# Patient Record
Sex: Female | Born: 1945 | Race: White | Hispanic: No | Marital: Married | Smoking: Never smoker
Health system: Southern US, Community
[De-identification: ages and names within clinical notes are randomized; demographics above are authoritative.]

## PROBLEM LIST (undated history)

## (undated) DIAGNOSIS — T7840XA Allergy, unspecified, initial encounter: Secondary | ICD-10-CM

## (undated) DIAGNOSIS — K219 Gastro-esophageal reflux disease without esophagitis: Secondary | ICD-10-CM

## (undated) DIAGNOSIS — I10 Essential (primary) hypertension: Secondary | ICD-10-CM

## (undated) DIAGNOSIS — Z9109 Other allergy status, other than to drugs and biological substances: Secondary | ICD-10-CM

## (undated) DIAGNOSIS — R011 Cardiac murmur, unspecified: Secondary | ICD-10-CM

## (undated) DIAGNOSIS — K589 Irritable bowel syndrome without diarrhea: Secondary | ICD-10-CM

## (undated) DIAGNOSIS — R7303 Prediabetes: Secondary | ICD-10-CM

## (undated) DIAGNOSIS — M519 Unspecified thoracic, thoracolumbar and lumbosacral intervertebral disc disorder: Secondary | ICD-10-CM

## (undated) DIAGNOSIS — C50419 Malignant neoplasm of upper-outer quadrant of unspecified female breast: Secondary | ICD-10-CM

## (undated) DIAGNOSIS — H269 Unspecified cataract: Secondary | ICD-10-CM

## (undated) DIAGNOSIS — E7800 Pure hypercholesterolemia, unspecified: Secondary | ICD-10-CM

## (undated) DIAGNOSIS — C801 Malignant (primary) neoplasm, unspecified: Secondary | ICD-10-CM

## (undated) DIAGNOSIS — R739 Hyperglycemia, unspecified: Secondary | ICD-10-CM

## (undated) DIAGNOSIS — R7989 Other specified abnormal findings of blood chemistry: Secondary | ICD-10-CM

## (undated) DIAGNOSIS — K635 Polyp of colon: Secondary | ICD-10-CM

## (undated) DIAGNOSIS — R928 Other abnormal and inconclusive findings on diagnostic imaging of breast: Secondary | ICD-10-CM

## (undated) DIAGNOSIS — R0989 Other specified symptoms and signs involving the circulatory and respiratory systems: Secondary | ICD-10-CM

## (undated) DIAGNOSIS — R945 Abnormal results of liver function studies: Secondary | ICD-10-CM

## (undated) DIAGNOSIS — D175 Benign lipomatous neoplasm of intra-abdominal organs: Secondary | ICD-10-CM

## (undated) DIAGNOSIS — D649 Anemia, unspecified: Secondary | ICD-10-CM

## (undated) DIAGNOSIS — M1990 Unspecified osteoarthritis, unspecified site: Secondary | ICD-10-CM

## (undated) DIAGNOSIS — K5790 Diverticulosis of intestine, part unspecified, without perforation or abscess without bleeding: Secondary | ICD-10-CM

## (undated) DIAGNOSIS — Z8489 Family history of other specified conditions: Secondary | ICD-10-CM

## (undated) DIAGNOSIS — D126 Benign neoplasm of colon, unspecified: Secondary | ICD-10-CM

## (undated) HISTORY — DX: Benign neoplasm of colon, unspecified: D12.6

## (undated) HISTORY — DX: Unspecified thoracic, thoracolumbar and lumbosacral intervertebral disc disorder: M51.9

## (undated) HISTORY — DX: Other abnormal and inconclusive findings on diagnostic imaging of breast: R92.8

## (undated) HISTORY — DX: Essential (primary) hypertension: I10

## (undated) HISTORY — DX: Anemia, unspecified: D64.9

## (undated) HISTORY — DX: Abnormal results of liver function studies: R94.5

## (undated) HISTORY — DX: Other specified abnormal findings of blood chemistry: R79.89

## (undated) HISTORY — PX: GALLBLADDER SURGERY: SHX652

## (undated) HISTORY — DX: Polyp of colon: K63.5

## (undated) HISTORY — PX: OTHER SURGICAL HISTORY: SHX169

## (undated) HISTORY — DX: Irritable bowel syndrome, unspecified: K58.9

## (undated) HISTORY — PX: CATARACT EXTRACTION, BILATERAL: SHX1313

## (undated) HISTORY — DX: Benign lipomatous neoplasm of intra-abdominal organs: D17.5

## (undated) HISTORY — DX: Diverticulosis of intestine, part unspecified, without perforation or abscess without bleeding: K57.90

## (undated) HISTORY — DX: Pure hypercholesterolemia, unspecified: E78.00

## (undated) HISTORY — DX: Allergy, unspecified, initial encounter: T78.40XA

## (undated) HISTORY — DX: Unspecified cataract: H26.9

## (undated) HISTORY — PX: HERNIA REPAIR: SHX51

## (undated) HISTORY — PX: LAPAROSCOPIC HYSTERECTOMY: SHX1926

## (undated) HISTORY — DX: Cardiac murmur, unspecified: R01.1

## (undated) HISTORY — DX: Malignant neoplasm of upper-outer quadrant of unspecified female breast: C50.419

## (undated) HISTORY — DX: Other specified symptoms and signs involving the circulatory and respiratory systems: R09.89

## (undated) HISTORY — DX: Gastro-esophageal reflux disease without esophagitis: K21.9

## (undated) HISTORY — DX: Malignant (primary) neoplasm, unspecified: C80.1

## (undated) HISTORY — DX: Other allergy status, other than to drugs and biological substances: Z91.09

## (undated) HISTORY — DX: Hyperglycemia, unspecified: R73.9

---

## 1968-08-02 HISTORY — PX: BREAST SURGERY: SHX581

## 1974-08-02 HISTORY — PX: TUBAL LIGATION: SHX77

## 1987-08-03 HISTORY — PX: ABDOMINAL HYSTERECTOMY: SHX81

## 1995-08-03 HISTORY — PX: NASAL SINUS SURGERY: SHX719

## 1996-08-02 HISTORY — PX: OTHER SURGICAL HISTORY: SHX169

## 1997-08-02 HISTORY — PX: ANKLE SURGERY: SHX546

## 1998-10-17 ENCOUNTER — Ambulatory Visit (HOSPITAL_COMMUNITY): Admission: RE | Admit: 1998-10-17 | Discharge: 1998-10-17 | Payer: Self-pay | Admitting: Neurosurgery

## 1999-01-27 ENCOUNTER — Ambulatory Visit (HOSPITAL_COMMUNITY): Admission: RE | Admit: 1999-01-27 | Discharge: 1999-01-27 | Payer: Self-pay | Admitting: Gastroenterology

## 1999-03-10 ENCOUNTER — Encounter: Admission: RE | Admit: 1999-03-10 | Discharge: 1999-03-26 | Payer: Self-pay | Admitting: Neurosurgery

## 1999-03-20 ENCOUNTER — Other Ambulatory Visit: Admission: RE | Admit: 1999-03-20 | Discharge: 1999-03-20 | Payer: Self-pay | Admitting: *Deleted

## 1999-08-03 HISTORY — PX: CHOLECYSTECTOMY: SHX55

## 1999-08-04 ENCOUNTER — Encounter: Payer: Self-pay | Admitting: *Deleted

## 1999-08-04 ENCOUNTER — Ambulatory Visit (HOSPITAL_COMMUNITY): Admission: RE | Admit: 1999-08-04 | Discharge: 1999-08-04 | Payer: Self-pay | Admitting: *Deleted

## 1999-08-14 ENCOUNTER — Encounter: Payer: Self-pay | Admitting: General Surgery

## 1999-08-14 ENCOUNTER — Inpatient Hospital Stay (HOSPITAL_COMMUNITY): Admission: RE | Admit: 1999-08-14 | Discharge: 1999-08-14 | Payer: Self-pay | Admitting: General Surgery

## 1999-12-09 ENCOUNTER — Other Ambulatory Visit: Admission: RE | Admit: 1999-12-09 | Discharge: 1999-12-09 | Payer: Self-pay | Admitting: *Deleted

## 2001-02-09 ENCOUNTER — Ambulatory Visit (HOSPITAL_COMMUNITY): Admission: RE | Admit: 2001-02-09 | Discharge: 2001-02-09 | Payer: Self-pay | Admitting: Gastroenterology

## 2001-02-23 ENCOUNTER — Other Ambulatory Visit: Admission: RE | Admit: 2001-02-23 | Discharge: 2001-02-23 | Payer: Self-pay | Admitting: *Deleted

## 2004-06-16 ENCOUNTER — Ambulatory Visit: Payer: Self-pay | Admitting: Internal Medicine

## 2004-07-01 ENCOUNTER — Ambulatory Visit: Payer: Self-pay | Admitting: Internal Medicine

## 2004-07-28 ENCOUNTER — Ambulatory Visit: Payer: Self-pay | Admitting: Internal Medicine

## 2004-07-29 ENCOUNTER — Ambulatory Visit: Payer: Self-pay | Admitting: Unknown Physician Specialty

## 2004-11-30 ENCOUNTER — Encounter: Admission: RE | Admit: 2004-11-30 | Discharge: 2004-11-30 | Payer: Self-pay | Admitting: Orthopedic Surgery

## 2005-04-01 ENCOUNTER — Emergency Department: Payer: Self-pay | Admitting: Emergency Medicine

## 2005-06-09 ENCOUNTER — Ambulatory Visit: Payer: Self-pay | Admitting: Internal Medicine

## 2005-07-02 ENCOUNTER — Ambulatory Visit: Payer: Self-pay | Admitting: Internal Medicine

## 2005-07-20 ENCOUNTER — Ambulatory Visit: Payer: Self-pay | Admitting: Internal Medicine

## 2005-08-10 ENCOUNTER — Ambulatory Visit: Payer: Self-pay | Admitting: Internal Medicine

## 2007-03-02 ENCOUNTER — Ambulatory Visit: Payer: Self-pay | Admitting: Internal Medicine

## 2007-06-23 ENCOUNTER — Ambulatory Visit: Payer: Self-pay | Admitting: Internal Medicine

## 2008-03-07 ENCOUNTER — Ambulatory Visit: Payer: Self-pay | Admitting: Internal Medicine

## 2008-03-25 ENCOUNTER — Ambulatory Visit: Payer: Self-pay | Admitting: Internal Medicine

## 2008-10-14 ENCOUNTER — Ambulatory Visit: Payer: Self-pay | Admitting: Unknown Physician Specialty

## 2009-03-26 ENCOUNTER — Ambulatory Visit: Payer: Self-pay | Admitting: Internal Medicine

## 2010-06-04 ENCOUNTER — Ambulatory Visit: Payer: Self-pay | Admitting: Internal Medicine

## 2010-06-10 ENCOUNTER — Ambulatory Visit: Payer: Self-pay | Admitting: Internal Medicine

## 2010-08-02 DIAGNOSIS — K5790 Diverticulosis of intestine, part unspecified, without perforation or abscess without bleeding: Secondary | ICD-10-CM

## 2010-08-02 HISTORY — DX: Diverticulosis of intestine, part unspecified, without perforation or abscess without bleeding: K57.90

## 2011-04-21 ENCOUNTER — Ambulatory Visit: Payer: Self-pay | Admitting: Internal Medicine

## 2011-06-02 ENCOUNTER — Ambulatory Visit: Payer: Self-pay | Admitting: Gastroenterology

## 2011-06-02 HISTORY — PX: COLONOSCOPY: SHX174

## 2011-06-14 ENCOUNTER — Ambulatory Visit: Payer: Self-pay | Admitting: Internal Medicine

## 2011-06-16 ENCOUNTER — Ambulatory Visit: Payer: Self-pay | Admitting: Internal Medicine

## 2011-12-15 ENCOUNTER — Ambulatory Visit: Payer: Self-pay | Admitting: Internal Medicine

## 2012-05-16 ENCOUNTER — Ambulatory Visit (INDEPENDENT_AMBULATORY_CARE_PROVIDER_SITE_OTHER): Payer: Medicare Other

## 2012-05-16 DIAGNOSIS — Z23 Encounter for immunization: Secondary | ICD-10-CM

## 2012-06-19 ENCOUNTER — Ambulatory Visit: Payer: Self-pay | Admitting: General Surgery

## 2012-07-13 ENCOUNTER — Telehealth: Payer: Self-pay | Admitting: Internal Medicine

## 2012-07-13 NOTE — Telephone Encounter (Signed)
Refill request for potassium chloride ter #60 Sig: take one tablet by mouth twice daily

## 2012-07-14 ENCOUNTER — Other Ambulatory Visit: Payer: Self-pay | Admitting: *Deleted

## 2012-07-14 MED ORDER — POTASSIUM CHLORIDE ER 10 MEQ PO TBCR: 10.0000 meq | EXTENDED_RELEASE_TABLET | Freq: Two times a day (BID) | ORAL | Status: DC

## 2012-07-14 NOTE — Telephone Encounter (Signed)
Script filled per telephone

## 2012-07-14 NOTE — Progress Notes (Signed)
Patient script refilled

## 2012-07-21 ENCOUNTER — Ambulatory Visit (INDEPENDENT_AMBULATORY_CARE_PROVIDER_SITE_OTHER): Payer: Medicare Other | Admitting: Internal Medicine

## 2012-07-21 ENCOUNTER — Encounter: Payer: Self-pay | Admitting: Internal Medicine

## 2012-07-21 VITALS — BP 120/70 | HR 81 | Temp 98.0°F | Ht 60.0 in | Wt 182.8 lb

## 2012-07-21 DIAGNOSIS — Z9109 Other allergy status, other than to drugs and biological substances: Secondary | ICD-10-CM

## 2012-07-21 DIAGNOSIS — I10 Essential (primary) hypertension: Secondary | ICD-10-CM

## 2012-07-21 DIAGNOSIS — E7800 Pure hypercholesterolemia, unspecified: Secondary | ICD-10-CM

## 2012-07-21 DIAGNOSIS — M519 Unspecified thoracic, thoracolumbar and lumbosacral intervertebral disc disorder: Secondary | ICD-10-CM

## 2012-07-21 DIAGNOSIS — R739 Hyperglycemia, unspecified: Secondary | ICD-10-CM

## 2012-07-21 DIAGNOSIS — R0989 Other specified symptoms and signs involving the circulatory and respiratory systems: Secondary | ICD-10-CM

## 2012-07-21 DIAGNOSIS — R945 Abnormal results of liver function studies: Secondary | ICD-10-CM | POA: Insufficient documentation

## 2012-07-21 DIAGNOSIS — R7989 Other specified abnormal findings of blood chemistry: Secondary | ICD-10-CM

## 2012-07-21 DIAGNOSIS — K219 Gastro-esophageal reflux disease without esophagitis: Secondary | ICD-10-CM

## 2012-07-21 DIAGNOSIS — K5790 Diverticulosis of intestine, part unspecified, without perforation or abscess without bleeding: Secondary | ICD-10-CM | POA: Insufficient documentation

## 2012-07-21 DIAGNOSIS — R5381 Other malaise: Secondary | ICD-10-CM

## 2012-07-21 DIAGNOSIS — R5383 Other fatigue: Secondary | ICD-10-CM

## 2012-07-21 DIAGNOSIS — R7309 Other abnormal glucose: Secondary | ICD-10-CM

## 2012-07-21 DIAGNOSIS — K5730 Diverticulosis of large intestine without perforation or abscess without bleeding: Secondary | ICD-10-CM

## 2012-07-21 MED ORDER — PANTOPRAZOLE SODIUM 40 MG PO TBEC: 40.0000 mg | DELAYED_RELEASE_TABLET | Freq: Two times a day (BID) | ORAL | Status: DC

## 2012-07-21 NOTE — Patient Instructions (Signed)
It was nice seeing you today.  I want you to try Protonix 40mg  - take one - two times per day.  Let me know if problems.

## 2012-07-22 ENCOUNTER — Encounter: Payer: Self-pay | Admitting: Internal Medicine

## 2012-07-22 DIAGNOSIS — K219 Gastro-esophageal reflux disease without esophagitis: Secondary | ICD-10-CM | POA: Insufficient documentation

## 2012-07-22 NOTE — Assessment & Plan Note (Signed)
Unable to tolerate statins.  On niacin.  Low cholesterol diet and exercise.  Follow.   

## 2012-07-22 NOTE — Assessment & Plan Note (Signed)
Evaluated by orthopedics.  She declined epidural.  Has had physical therapy.  Follow.

## 2012-07-22 NOTE — Progress Notes (Signed)
Subjective:    Patient ID: Helen Marshall, female    DOB: June 26, 1946, 66 y.o.   MRN: [id number]  HPI 66 year old female with past history of hypertension, hypercholesterolemia and GERD who comes in today to follow up on these issues as well as for a complete physical exam.  She states she is doing relatively well.  Has had increased problems with acid reflux.  Saw Dr Jenne Campus.  On allergy shots now.  Also, now taking omeprazole bid.  Still having acid reflux.  Mostly at night.  Feels bloated.  Some constipation.  No nausea or vomiting.  No blood in the stool.  No urinary change.    Past Medical History  Diagnosis Date  . Hypertension   . Hypercholesterolemia   . IBS (irritable bowel syndrome)   . GERD (gastroesophageal reflux disease)   . Carotid bruit     left  . Diverticulosis   . Hyperglycemia   . Lumbar disc disease   . Environmental allergies     Current Outpatient Prescriptions on File Prior to Visit  Medication Sig Dispense Refill  . amLODipine (NORVASC) 5 MG tablet Take 5 mg by mouth daily.       . benazepril (LOTENSIN) 40 MG tablet Take 40 mg by mouth daily.       . calcium-vitamin D (CALCIUM 500+D) 500-400 MG-UNIT per tablet Take 1 tablet by mouth 2 (two) times daily.      . fluticasone (FLONASE) 50 MCG/ACT nasal spray Place 2 sprays into the nose daily.      . hydrochlorothiazide (HYDRODIURIL) 25 MG tablet Take 25 mg by mouth daily.       . niacin (NIASPAN) 1000 MG CR tablet Take 1,000 mg by mouth 2 (two) times daily.      . potassium chloride (K-DUR) 10 MEQ tablet Take 1 tablet (10 mEq total) by mouth 2 (two) times daily.  60 tablet  0  . sertraline (ZOLOFT) 25 MG tablet Take 25 mg by mouth daily.      . pantoprazole (PROTONIX) 40 MG tablet Take 1 tablet (40 mg total) by mouth 2 (two) times daily.  60 tablet  3    Review of Systems Patient denies any headache, lightheadedness or dizziness. No sinus or allergy symptoms.  No chest pain, tightness or palpitations.  No  increased shortness of breath, cough or congestion.  No nausea or vomiting.  No abdominal pain or cramping.  Does have the increased acid reflux as outlined.  On prilosec bid now.  No diarrhea, BRBPR or melana.  Does report some problems with constipation.  No urine change.  Handling stress relatively well.       Objective:   Physical Exam Filed Vitals:   07/21/12 1035  BP: 120/70  Pulse: 81  Temp: 98 F (36.7 C)   Blood pressure recheck:  15/75  66 year old female in no acute distress.   HEENT:  Nares- clear.  Oropharynx - without lesions. NECK:  Supple.  Nontender.  No audible bruit.  HEART:  Appears to be regular. LUNGS:  No crackles or wheezing audible.  Respirations even and unlabored.  RADIAL PULSE:  Equal bilaterally.    BREASTS:  No nipple discharge or nipple retraction present.  Could not appreciate any distinct nodules or axillary adenopathy.  ABDOMEN:  Soft, nontender.  Bowel sounds present and normal.  No audible abdominal bruit.  GU:  Normal external genitalia.  Vaginal vault without lesions.  S/P hysterectomy.  Could not appreciate  any adnexal masses or tenderness.   RECTAL:  Heme negative.   EXTREMITIES:  No increased edema present.  DP pulses palpable and equal bilaterally.           Assessment & Plan:  HISTORY OF LEFT CAROTID BRUIT.  Carotid ultrasound 9/12 revealed no significant stenosis.  Asymptomatic.  Continue daily aspirin.   CARDIOVASCULAR.  Stable.  Continue risk factor modification.  Has a known murmur.    INCREASED PSYCHOSOCIAL STRESSORS.  On Zoloft.  Stable.  Follow.  ABNORMAL MAMMOGRAM.  Just saw Dr Lemar Livings.  Had a follow up mammogram 06/19/12 - Birads II.  Follow.     HEALTH MAINTENANCE.  Physical today.  Is s/p hysterectomy.  Does not require yearly pap smears.  Colonoscopy 06/02/11 revealed diverticulosis and a lipoma.  Had follow up mammogram (Dr Lemar Livings) 06/19/12 - BiRADS II.  Bone density 05/26/10 normal.

## 2012-07-22 NOTE — Assessment & Plan Note (Signed)
Last liver panel wnl.  Recheck liver panel.

## 2012-07-22 NOTE — Assessment & Plan Note (Signed)
Seeing Dr McQueen.  Receiving allergy shots now.  Follow.   

## 2012-07-22 NOTE — Assessment & Plan Note (Signed)
Blood pressure is under good control.  Same meds.  Check metabolic panel.     

## 2012-07-22 NOTE — Assessment & Plan Note (Signed)
Low carb diet and exercise.  Check met b and a1c.   

## 2012-07-22 NOTE — Assessment & Plan Note (Signed)
Carotid ultrasound 9/12 revealed no significant stenosis.  Asymptomatic. Continue daily aspirin.   

## 2012-07-22 NOTE — Assessment & Plan Note (Addendum)
Some constipation.  Follow.  Increased fiber, fluid intake, etc.  Colonoscopy 06/02/11 revealed diverticulosis and a lipoma.

## 2012-07-22 NOTE — Assessment & Plan Note (Signed)
Increased symptoms.  Improved some on prilosec twice a day.  Still with break through symptoms.  Not eating late.  Trying to watch what she eats.  Will change to Protonix 40mg  bid.  Follow.  Get her back in soon to reassess.

## 2012-07-24 ENCOUNTER — Other Ambulatory Visit (INDEPENDENT_AMBULATORY_CARE_PROVIDER_SITE_OTHER): Payer: Medicare Other

## 2012-07-24 DIAGNOSIS — R5383 Other fatigue: Secondary | ICD-10-CM

## 2012-07-24 DIAGNOSIS — R7309 Other abnormal glucose: Secondary | ICD-10-CM

## 2012-07-24 DIAGNOSIS — R945 Abnormal results of liver function studies: Secondary | ICD-10-CM

## 2012-07-24 DIAGNOSIS — E7800 Pure hypercholesterolemia, unspecified: Secondary | ICD-10-CM

## 2012-07-24 DIAGNOSIS — I10 Essential (primary) hypertension: Secondary | ICD-10-CM

## 2012-07-24 DIAGNOSIS — R7989 Other specified abnormal findings of blood chemistry: Secondary | ICD-10-CM

## 2012-07-24 DIAGNOSIS — R739 Hyperglycemia, unspecified: Secondary | ICD-10-CM

## 2012-07-24 LAB — BASIC METABOLIC PANEL
BUN: 13 mg/dL (ref 6–23)
Calcium: 9.2 mg/dL (ref 8.4–10.5)
GFR: 77.16 mL/min (ref >60.00)
Glucose, Bld: 112 mg/dL — ABNORMAL HIGH (ref 70–99)
Sodium: 136 mEq/L (ref 135–145)

## 2012-07-24 LAB — LIPID PANEL
HDL: 68 mg/dL (ref >39.00)
Triglycerides: 73 mg/dL (ref 0.0–149.0)

## 2012-07-24 LAB — HEPATIC FUNCTION PANEL
ALT: 24 U/L (ref 0–35)
AST: 28 U/L (ref 0–37)
Albumin: 3.8 g/dL (ref 3.5–5.2)

## 2012-07-24 LAB — LDL CHOLESTEROL, DIRECT: Direct LDL: 117.8 mg/dL

## 2012-07-24 LAB — CBC WITH DIFFERENTIAL/PLATELET
Basophils Absolute: 0 10*3/uL (ref 0.0–0.1)
Lymphocytes Relative: 33 % (ref 12.0–46.0)
Monocytes Relative: 10.3 % (ref 3.0–12.0)
Platelets: 217 10*3/uL (ref 150.0–400.0)
RDW: 13.6 % (ref 11.5–14.6)

## 2012-07-24 LAB — TSH: TSH: 0.92 u[IU]/mL (ref 0.35–5.50)

## 2012-08-02 DIAGNOSIS — C801 Malignant (primary) neoplasm, unspecified: Secondary | ICD-10-CM

## 2012-08-02 HISTORY — DX: Malignant (primary) neoplasm, unspecified: C80.1

## 2012-08-02 HISTORY — PX: BREAST LUMPECTOMY: SHX2

## 2012-08-09 ENCOUNTER — Other Ambulatory Visit: Payer: Self-pay | Admitting: *Deleted

## 2012-08-09 ENCOUNTER — Telehealth: Payer: Self-pay | Admitting: Internal Medicine

## 2012-08-09 MED ORDER — HYDROCHLOROTHIAZIDE 25 MG PO TABS: 25.0000 mg | ORAL_TABLET | Freq: Every day | ORAL | Status: DC

## 2012-08-09 MED ORDER — AMLODIPINE BESYLATE 5 MG PO TABS: 5.0000 mg | ORAL_TABLET | Freq: Every day | ORAL | Status: DC

## 2012-08-09 MED ORDER — BENAZEPRIL HCL 40 MG PO TABS: 40.0000 mg | ORAL_TABLET | Freq: Every day | ORAL | Status: DC

## 2012-08-09 NOTE — Telephone Encounter (Signed)
Patient's scripts filled

## 2012-08-09 NOTE — Telephone Encounter (Signed)
amLODipine (NORVASC) 5 MG tablet  #90  hydrochlorothiazide (HYDRODIURIL) 25 MG tablet  #90  benazepril (LOTENSIN) 40 MG tablet  #90

## 2012-08-09 NOTE — Telephone Encounter (Signed)
Open in error

## 2012-09-21 ENCOUNTER — Ambulatory Visit (INDEPENDENT_AMBULATORY_CARE_PROVIDER_SITE_OTHER): Payer: Medicare Other | Admitting: Internal Medicine

## 2012-09-21 ENCOUNTER — Encounter: Payer: Self-pay | Admitting: Internal Medicine

## 2012-09-21 VITALS — BP 120/70 | HR 74 | Temp 97.9°F | Ht 64.16 in | Wt 184.2 lb

## 2012-09-21 DIAGNOSIS — R945 Abnormal results of liver function studies: Secondary | ICD-10-CM

## 2012-09-21 DIAGNOSIS — I10 Essential (primary) hypertension: Secondary | ICD-10-CM

## 2012-09-21 DIAGNOSIS — K219 Gastro-esophageal reflux disease without esophagitis: Secondary | ICD-10-CM

## 2012-09-21 DIAGNOSIS — R739 Hyperglycemia, unspecified: Secondary | ICD-10-CM

## 2012-09-21 DIAGNOSIS — Z9109 Other allergy status, other than to drugs and biological substances: Secondary | ICD-10-CM

## 2012-09-21 DIAGNOSIS — R7989 Other specified abnormal findings of blood chemistry: Secondary | ICD-10-CM

## 2012-09-21 DIAGNOSIS — R7309 Other abnormal glucose: Secondary | ICD-10-CM

## 2012-09-21 DIAGNOSIS — E7800 Pure hypercholesterolemia, unspecified: Secondary | ICD-10-CM

## 2012-09-21 MED ORDER — LIDOCAINE 5 % EX PTCH: 1.0000 | MEDICATED_PATCH | CUTANEOUS | Status: DC

## 2012-10-01 ENCOUNTER — Encounter: Payer: Self-pay | Admitting: Internal Medicine

## 2012-10-01 NOTE — Assessment & Plan Note (Signed)
Seeing Dr McQueen.  Receiving allergy shots now.  Follow.   

## 2012-10-01 NOTE — Assessment & Plan Note (Signed)
Blood pressure is under good control.  Same meds.  Follow metabolic panel.   

## 2012-10-01 NOTE — Assessment & Plan Note (Signed)
On Protonix 40mg  bid.  Doing better.  Declines GI referral.

## 2012-10-01 NOTE — Assessment & Plan Note (Signed)
Low carb diet and exercise.  Follow met b and a1c.  Last a1c - 6 (07/24/12).

## 2012-10-01 NOTE — Assessment & Plan Note (Signed)
Last liver panel wnl.  Follow liver panel.   

## 2012-10-01 NOTE — Assessment & Plan Note (Signed)
Unable to tolerate statins.  On niacin.  Low cholesterol diet and exercise.  Follow.  Last lipid profile 07/24/12 revealed total cholesterol 207, triglycerides 73, HDL 68 and LDL 118.

## 2012-10-01 NOTE — Progress Notes (Signed)
Subjective:    Patient ID: Helen Marshall, female    DOB: [birth year], 67 y.o.   MRN: [id number]  HPI 67 year old female with past history of hypertension, hypercholesterolemia and GERD who comes in today for a scheduled follow up.  On protonix.  Doing better.  Saw Dr Jenne Campus.  On allergy shots now.    No nausea or vomiting.  No blood in the stool.  No urinary change.  Feels her  Bowels are stable.    Past Medical History  Diagnosis Date  . Hypertension   . Hypercholesterolemia   . IBS (irritable bowel syndrome)   . GERD (gastroesophageal reflux disease)   . Carotid bruit     left  . Diverticulosis   . Hyperglycemia   . Lumbar disc disease   . Environmental allergies     Current Outpatient Prescriptions on File Prior to Visit  Medication Sig Dispense Refill  . amLODipine (NORVASC) 5 MG tablet Take 1 tablet (5 mg total) by mouth daily.  90 tablet  3  . aspirin 325 MG EC tablet Take 325 mg by mouth daily.      . benazepril (LOTENSIN) 40 MG tablet Take 1 tablet (40 mg total) by mouth daily.  30 tablet  6  . calcium-vitamin D (CALCIUM 500+D) 500-400 MG-UNIT per tablet Take 1 tablet by mouth daily.       . fish oil-omega-3 fatty acids 1000 MG capsule 5 (five) times daily.       . hydrochlorothiazide (HYDRODIURIL) 25 MG tablet Take 1 tablet (25 mg total) by mouth daily.  30 tablet  6  . Multiple Vitamin (MULTIVITAMIN) tablet Take 1 tablet by mouth daily.      . niacin (NIASPAN) 1000 MG CR tablet Take 1,000 mg by mouth 2 (two) times daily.      . pantoprazole (PROTONIX) 40 MG tablet Take 1 tablet (40 mg total) by mouth 2 (two) times daily.  60 tablet  3  . potassium chloride (K-DUR) 10 MEQ tablet Take 1 tablet (10 mEq total) by mouth 2 (two) times daily.  60 tablet  0  . fluticasone (FLONASE) 50 MCG/ACT nasal spray Place 2 sprays into the nose daily.      . sertraline (ZOLOFT) 25 MG tablet Take 25 mg by mouth daily.       No current facility-administered medications on file prior to  visit.    Review of Systems Patient denies any headache, lightheadedness or dizziness. No sinus or allergy symptoms.  No chest pain, tightness or palpitations.  No increased shortness of breath, cough or congestion.  No nausea or vomiting.  No abdominal pain or cramping.  Acid reflux better.   No urine change.  Handling stress relatively well.       Objective:   Physical Exam  Filed Vitals:   09/21/12 0845  BP: 120/70  Pulse: 74  Temp: 97.9 F (36.6 C)   Blood pressure recheck:  128/74, pulse 19  67 year old female in no acute distress.   HEENT:  Nares- clear.  Oropharynx - without lesions. NECK:  Supple.  Nontender.  No audible bruit.  HEART:  Appears to be regular. LUNGS:  No crackles or wheezing audible.  Respirations even and unlabored.  RADIAL PULSE:  Equal bilaterally.  ABDOMEN:  Soft, nontender.  Bowel sounds present and normal.  No audible abdominal bruit.   EXTREMITIES:  No increased edema present.  DP pulses palpable and equal bilaterally.  Assessment & Plan:  HISTORY OF LEFT CAROTID BRUIT.  Carotid ultrasound 9/12 revealed no significant stenosis.  Asymptomatic.  Continue daily aspirin.   CARDIOVASCULAR.  Stable.  Continue risk factor modification.  Has a known murmur.    INCREASED PSYCHOSOCIAL STRESSORS.  On Zoloft.  Stable.  Follow.  ABNORMAL MAMMOGRAM.  Just saw Dr Lemar Livings.  Had a follow up mammogram 06/19/12 - Birads II.  Follow.     HEALTH MAINTENANCE.  Physical 07/21/12.  Is s/p hysterectomy.  Does not require yearly pap smears.  Colonoscopy 06/02/11 revealed diverticulosis and a lipoma.  Had follow up mammogram (Dr Lemar Livings) 06/19/12 - BiRADS II.  Bone density 05/26/10 normal.

## 2012-10-28 ENCOUNTER — Encounter: Payer: Self-pay | Admitting: General Surgery

## 2012-11-29 ENCOUNTER — Encounter: Payer: Self-pay | Admitting: *Deleted

## 2012-12-13 ENCOUNTER — Other Ambulatory Visit: Payer: Self-pay | Admitting: *Deleted

## 2012-12-13 MED ORDER — PANTOPRAZOLE SODIUM 40 MG PO TBEC: 40.0000 mg | DELAYED_RELEASE_TABLET | Freq: Two times a day (BID) | ORAL | Status: DC

## 2012-12-15 ENCOUNTER — Encounter: Payer: Self-pay | Admitting: Internal Medicine

## 2012-12-15 ENCOUNTER — Other Ambulatory Visit (INDEPENDENT_AMBULATORY_CARE_PROVIDER_SITE_OTHER): Payer: Medicare Other

## 2012-12-15 DIAGNOSIS — R7989 Other specified abnormal findings of blood chemistry: Secondary | ICD-10-CM

## 2012-12-15 DIAGNOSIS — R7309 Other abnormal glucose: Secondary | ICD-10-CM

## 2012-12-15 DIAGNOSIS — E7800 Pure hypercholesterolemia, unspecified: Secondary | ICD-10-CM

## 2012-12-15 DIAGNOSIS — I10 Essential (primary) hypertension: Secondary | ICD-10-CM

## 2012-12-15 DIAGNOSIS — R739 Hyperglycemia, unspecified: Secondary | ICD-10-CM

## 2012-12-15 DIAGNOSIS — R945 Abnormal results of liver function studies: Secondary | ICD-10-CM

## 2012-12-15 LAB — HEPATIC FUNCTION PANEL: Albumin: 3.7 g/dL (ref 3.5–5.2)

## 2012-12-15 LAB — LIPID PANEL
Cholesterol: 234 mg/dL — ABNORMAL HIGH (ref 0–200)
HDL: 87.7 mg/dL (ref >39.00)
Total CHOL/HDL Ratio: 3
Triglycerides: 100 mg/dL (ref 0.0–149.0)
VLDL: 20 mg/dL (ref 0.0–40.0)

## 2012-12-15 LAB — BASIC METABOLIC PANEL
BUN: 12 mg/dL (ref 6–23)
CO2: 31 mEq/L (ref 19–32)
Chloride: 102 mEq/L (ref 96–112)
Potassium: 3.7 mEq/L (ref 3.5–5.1)

## 2012-12-15 LAB — LDL CHOLESTEROL, DIRECT: Direct LDL: 120.2 mg/dL

## 2012-12-19 ENCOUNTER — Ambulatory Visit: Payer: Self-pay | Admitting: General Surgery

## 2012-12-19 DIAGNOSIS — R928 Other abnormal and inconclusive findings on diagnostic imaging of breast: Secondary | ICD-10-CM

## 2012-12-19 HISTORY — DX: Other abnormal and inconclusive findings on diagnostic imaging of breast: R92.8

## 2012-12-20 ENCOUNTER — Ambulatory Visit (INDEPENDENT_AMBULATORY_CARE_PROVIDER_SITE_OTHER): Payer: Medicare Other | Admitting: Internal Medicine

## 2012-12-20 ENCOUNTER — Encounter: Payer: Self-pay | Admitting: Internal Medicine

## 2012-12-20 VITALS — BP 110/70 | HR 86 | Temp 98.3°F | Ht 64.16 in | Wt 184.8 lb

## 2012-12-20 DIAGNOSIS — R7989 Other specified abnormal findings of blood chemistry: Secondary | ICD-10-CM

## 2012-12-20 DIAGNOSIS — R7309 Other abnormal glucose: Secondary | ICD-10-CM

## 2012-12-20 DIAGNOSIS — Z9109 Other allergy status, other than to drugs and biological substances: Secondary | ICD-10-CM

## 2012-12-20 DIAGNOSIS — M519 Unspecified thoracic, thoracolumbar and lumbosacral intervertebral disc disorder: Secondary | ICD-10-CM

## 2012-12-20 DIAGNOSIS — R0989 Other specified symptoms and signs involving the circulatory and respiratory systems: Secondary | ICD-10-CM

## 2012-12-20 DIAGNOSIS — K5730 Diverticulosis of large intestine without perforation or abscess without bleeding: Secondary | ICD-10-CM

## 2012-12-20 DIAGNOSIS — R945 Abnormal results of liver function studies: Secondary | ICD-10-CM

## 2012-12-20 DIAGNOSIS — K219 Gastro-esophageal reflux disease without esophagitis: Secondary | ICD-10-CM

## 2012-12-20 DIAGNOSIS — K5790 Diverticulosis of intestine, part unspecified, without perforation or abscess without bleeding: Secondary | ICD-10-CM

## 2012-12-20 DIAGNOSIS — R739 Hyperglycemia, unspecified: Secondary | ICD-10-CM

## 2012-12-20 DIAGNOSIS — I10 Essential (primary) hypertension: Secondary | ICD-10-CM

## 2012-12-20 DIAGNOSIS — E7800 Pure hypercholesterolemia, unspecified: Secondary | ICD-10-CM

## 2012-12-20 NOTE — Progress Notes (Signed)
Subjective:    Patient ID: Helen Marshall, female    DOB: 07/25/1946, 67 y.o.   MRN: [id number]  HPI 67 year old female with past history of hypertension, hypercholesterolemia and GERD who comes in today for a scheduled follow up.  Had been doing well with her acid reflux and indigestion.  Was off protonix.  Ate foods this weekend that she normally dose not eat.  Had a flare.   Took protonix last night.  Better.  Saw Dr Jenne Campus.  On allergy shots now.    No nausea or vomiting.  No blood in the stool.  No urinary change.  Feels her bowels are stable.  Overall she feels she is doing well.  Handling stress well.    Past Medical History  Diagnosis Date  . Hypertension   . Hypercholesterolemia   . IBS (irritable bowel syndrome)   . GERD (gastroesophageal reflux disease)   . Carotid bruit     left  . Diverticulosis   . Hyperglycemia   . Lumbar disc disease   . Environmental allergies   . Heart murmur   . Diverticulosis 2012    Current Outpatient Prescriptions on File Prior to Visit  Medication Sig Dispense Refill  . amLODipine (NORVASC) 5 MG tablet Take 1 tablet (5 mg total) by mouth daily.  90 tablet  3  . aspirin 325 MG EC tablet Take 325 mg by mouth daily.      . benazepril (LOTENSIN) 40 MG tablet Take 1 tablet (40 mg total) by mouth daily.  30 tablet  6  . calcium-vitamin D (CALCIUM 500+D) 500-400 MG-UNIT per tablet Take 1 tablet by mouth daily.       . fish oil-omega-3 fatty acids 1000 MG capsule 5 (five) times daily.       . fluticasone (FLONASE) 50 MCG/ACT nasal spray Place 2 sprays into the nose daily.      . hydrochlorothiazide (HYDRODIURIL) 25 MG tablet Take 1 tablet (25 mg total) by mouth daily.  30 tablet  6  . lidocaine (LIDODERM) 5 % Place 1 patch onto the skin daily. Remove & Discard patch within 12 hours or as directed by MD  30 patch  0  . Multiple Vitamin (MULTIVITAMIN) tablet Take 1 tablet by mouth daily.      . niacin (NIASPAN) 1000 MG CR tablet Take 1,000 mg by  mouth 2 (two) times daily.      . pantoprazole (PROTONIX) 40 MG tablet Take 1 tablet (40 mg total) by mouth 2 (two) times daily.  60 tablet  5  . potassium chloride (K-DUR) 10 MEQ tablet Take 1 tablet (10 mEq total) by mouth 2 (two) times daily.  60 tablet  0  . sertraline (ZOLOFT) 25 MG tablet Take 25 mg by mouth daily.       No current facility-administered medications on file prior to visit.    Review of Systems Patient denies any headache, lightheadedness or dizziness. No significant sinus or allergy symptoms currently.  No chest pain, tightness or palpitations.  No increased shortness of breath, cough or congestion.  No nausea or vomiting.  No abdominal pain or cramping.  Acid reflux was doing well.  Of protonix.  See above.  Back on protonix now.   No urine change.  Handling stress relatively well.       Objective:   Physical Exam  Filed Vitals:   12/20/12 0948  BP: 110/70  Pulse: 86  Temp: 98.3 F (36.8 C)   Blood  pressure recheck:  120/68, pulse 8  67 year old female in no acute distress.   HEENT:  Nares- clear.  Oropharynx - without lesions. NECK:  Supple.  Nontender.  No audible bruit.  HEART:  Appears to be regular. I/VI systolic murmur.  LUNGS:  No crackles or wheezing audible.  Respirations even and unlabored.  RADIAL PULSE:  Equal bilaterally.  ABDOMEN:  Soft, nontender.  Bowel sounds present and normal.  No audible abdominal bruit.   EXTREMITIES:  No increased edema present.  DP pulses palpable and equal bilaterally.           Assessment & Plan:  HISTORY OF LEFT CAROTID BRUIT.  Carotid ultrasound 9/12 revealed no significant stenosis.  Asymptomatic.  Continue daily aspirin.   CARDIOVASCULAR.  Stable.  Continue risk factor modification.  Has a known murmur.    INCREASED PSYCHOSOCIAL STRESSORS.  On Zoloft.  Stable.  Follow.  ABNORMAL MAMMOGRAM.  Just saw Dr Lemar Livings.  Had a follow up mammogram 06/19/12 - Birads II.  Had f/u left breast yesterday.  Due to see Dr  Lemar Livings Thursday.    HEALTH MAINTENANCE.  Physical 07/21/12.  Is s/p hysterectomy.  Does not require yearly pap smears.  Colonoscopy 06/02/11 revealed diverticulosis and a lipoma.  Had follow up mammogram (Dr Lemar Livings) 06/19/12 - BiRADS II.  Follow up of the left breast yesterday.  Due to see Dr Lemar Livings Thursday.  Bone density 05/26/10 normal.

## 2012-12-24 ENCOUNTER — Encounter: Payer: Self-pay | Admitting: Internal Medicine

## 2012-12-24 NOTE — Assessment & Plan Note (Signed)
Was off protonix.  Doing better.  Ate the "wrong foods" this weekend.  Had flare.  Back on protonix.  Follow.  Notify me if persistent problems.  Is better.  Has declined GI referral.

## 2012-12-24 NOTE — Assessment & Plan Note (Signed)
Low carb diet and exercise.  Follow met b and a1c.  Last a1c - 6.2 (12/15/12).

## 2012-12-24 NOTE — Assessment & Plan Note (Signed)
Seeing Dr McQueen.  Receiving allergy shots now.  Follow.   

## 2012-12-24 NOTE — Assessment & Plan Note (Signed)
Blood pressure is under good control.  Same meds.  Follow metabolic panel.   

## 2012-12-24 NOTE — Assessment & Plan Note (Signed)
Carotid ultrasound 9/12 revealed no significant stenosis.  Asymptomatic. Continue daily aspirin.   

## 2012-12-24 NOTE — Assessment & Plan Note (Signed)
Unable to tolerate statins.  On niacin.  Low cholesterol diet and exercise.  Follow.  Last lipid profile 12/15/12 revealed total cholesterol 234, triglycerides 100, HDL 88 and LDL 120.

## 2012-12-24 NOTE — Assessment & Plan Note (Signed)
Bowels stable.  Colonoscopy 06/02/11 revealed diverticulosis and a lipoma.

## 2012-12-24 NOTE — Assessment & Plan Note (Signed)
Evaluated by orthopedics.  She declined epidural.  Has had physical therapy.  Follow.

## 2012-12-24 NOTE — Assessment & Plan Note (Signed)
Last liver panel wnl.  Follow liver panel.   

## 2012-12-27 ENCOUNTER — Encounter: Payer: Self-pay | Admitting: General Surgery

## 2012-12-28 ENCOUNTER — Ambulatory Visit: Payer: Self-pay | Admitting: General Surgery

## 2013-01-09 ENCOUNTER — Encounter: Payer: Self-pay | Admitting: Internal Medicine

## 2013-01-11 ENCOUNTER — Other Ambulatory Visit: Payer: Self-pay

## 2013-01-11 ENCOUNTER — Ambulatory Visit (INDEPENDENT_AMBULATORY_CARE_PROVIDER_SITE_OTHER): Payer: Medicare Other | Admitting: General Surgery

## 2013-01-11 ENCOUNTER — Encounter: Payer: Self-pay | Admitting: General Surgery

## 2013-01-11 VITALS — BP 120/70 | HR 68 | Resp 12 | Ht 64.5 in | Wt 183.0 lb

## 2013-01-11 DIAGNOSIS — R928 Other abnormal and inconclusive findings on diagnostic imaging of breast: Secondary | ICD-10-CM

## 2013-01-11 DIAGNOSIS — N63 Unspecified lump in unspecified breast: Secondary | ICD-10-CM

## 2013-01-11 NOTE — Patient Instructions (Addendum)
1 week nurse visit.     CARE AFTER BREAST BIOPSY  1. Leave the dressing on that your doctor applied after surgery. It is waterproof. You may bathe, shower and/or swim. The dressing will probably remain intact until your return office visit. If the dressing comes off, you will see small strips of tape against your skin on the incision. Do not remove these strips.  2. You may want to use a gauze,cloth or similar protection in your bra to prevent rubbing against your dressing and incision. This is not necessary, but you may feel more comfortable doing so.  3. It is recommended that you wear a bra day and night to give support to the breast. This will prevent the weight of the breast from pulling on the incision.  4. Your breast will feel hard and lumpy under the incision. Do not be alarmed. This is the underlying stitching of tissue. Softening of this tissue will occur in time.  5. Make sure you call the office and schedule an appointment in one week after your surgery. The office phone number is [phone]. The nurses at [facility] may have already done this for you.  6. You will notice about a week after your office visit that the strips of the tape on your incision will begin to loosen. These may then be removed.  7. Report to your doctor any of the following:  * Severe pain not relieved by your pain medication  *Redness of the incision  * Drainage from the incision  *Fever greater than 101 degrees

## 2013-01-11 NOTE — Progress Notes (Signed)
Patient ID: Helen Marshall, female   DOB: [birth year], 67 y.o.   MRN: [id number]  Chief Complaint  Patient presents with  . Follow-up    mammogram    HPI Helen Marshall is a 67 y.o. female Who presents for a breast evaluation. The most recent mammogram was done on 12/19/12 with a birad category 4.  Patient does not perform regular self breast checks but does gets regular mammograms done. She has a sister who has a history of breast cancer as well as a paternal first cousin. No new problems with her breasts.   HPI  Past Medical History  Diagnosis Date  . Hypertension   . Hypercholesterolemia   . IBS (irritable bowel syndrome)   . GERD (gastroesophageal reflux disease)   . Carotid bruit     left  . Diverticulosis   . Hyperglycemia   . Lumbar disc disease   . Environmental allergies   . Heart murmur   . Diverticulosis 2012  . Abnormal mammogram 12/19/2012    left    Past Surgical History  Procedure Laterality Date  . Cholecystectomy  2001  . Rotator cuff surgery  1998  . Nasal sinus surgery  1997  . Tubal ligation  1976  . Breast surgery Left 1970    biopsy  . Abdominal hysterectomy  1989    fibroids  . Colonoscopy  2012  . Ankle surgery Left 1999    Family History  Problem Relation Age of Onset  . CVA Father   . Alcohol abuse Father   . Heart disease Mother     myocardial infarction  . Diabetes Mother   . Hypertension Brother     x2  . Alcohol abuse Brother   . Arthritis/Rheumatoid Sister     x2  . Cancer Sister 3    breast   . Osteoarthritis Sister   . Asthma Sister   . Hypothyroidism Sister   . Cancer Other     breast - paternal first cousin    Social History History  Substance Use Topics  . Smoking status: Never Smoker   . Smokeless tobacco: Never Used  . Alcohol Use: No    Allergies  Allergen Reactions  . Crestor (Rosuvastatin) Other (See Comments)    Leg cramping   . Demerol (Meperidine) Nausea And Vomiting  . Latex Other (See Comments)     Redness, hands breakout  . Lipitor (Atorvastatin) Other (See Comments)    Intolerant   . Lovastatin Other (See Comments)    Elevated CK  . Pravastatin Other (See Comments)    intolerant  . Vicodin (Hydrocodone-Acetaminophen) Other (See Comments)    itching  . Zocor (Simvastatin) Other (See Comments)    Intolerant     Current Outpatient Prescriptions  Medication Sig Dispense Refill  . amLODipine (NORVASC) 5 MG tablet Take 1 tablet (5 mg total) by mouth daily.  90 tablet  3  . aspirin 325 MG EC tablet Take 325 mg by mouth daily.      . benazepril (LOTENSIN) 40 MG tablet Take 1 tablet (40 mg total) by mouth daily.  30 tablet  6  . calcium-vitamin D (CALCIUM 500+D) 500-400 MG-UNIT per tablet Take 1 tablet by mouth daily.       . fish oil-omega-3 fatty acids 1000 MG capsule 5 (five) times daily.       . fluticasone (FLONASE) 50 MCG/ACT nasal spray Place 2 sprays into the nose daily.      . hydrochlorothiazide (HYDRODIURIL)  25 MG tablet Take 1 tablet (25 mg total) by mouth daily.  30 tablet  6  . lidocaine (LIDODERM) 5 % Place 1 patch onto the skin daily. Remove & Discard patch within 12 hours or as directed by MD  30 patch  0  . Multiple Vitamin (MULTIVITAMIN) tablet Take 1 tablet by mouth daily.      . niacin (NIASPAN) 1000 MG CR tablet Take 1,000 mg by mouth 2 (two) times daily.      . pantoprazole (PROTONIX) 40 MG tablet Take 1 tablet (40 mg total) by mouth 2 (two) times daily.  60 tablet  5  . potassium chloride (K-DUR) 10 MEQ tablet Take 1 tablet (10 mEq total) by mouth 2 (two) times daily.  60 tablet  0  . sertraline (ZOLOFT) 25 MG tablet Take 25 mg by mouth daily.       No current facility-administered medications for this visit.    Review of Systems Review of Systems  Constitutional: Negative.   Respiratory: Negative.   Cardiovascular: Negative.     Blood pressure 120/70, pulse 68, resp. rate 12, height 5' 4.5" (1.638 m), weight 183 lb (83.008 kg), last menstrual  period 07/21/1988.  Physical Exam Physical Exam  Constitutional: She appears well-developed and well-nourished.  Cardiovascular: Normal rate and regular rhythm.   Murmur heard.  Systolic murmur is present with a grade of 2/6  Upper right sternal border, unchanged from past exams.  Pulmonary/Chest: Effort normal and breath sounds normal. Right breast exhibits no inverted nipple, no mass, no nipple discharge, no skin change and no tenderness. Left breast exhibits no inverted nipple, no mass, no nipple discharge, no skin change and no tenderness. Breasts are symmetrical.  Abdominal: A hernia is present.  1.5 cm umbilical hernia noted.  Lymphadenopathy:    She has no cervical adenopathy.    She has no axillary adenopathy.  Neurological: She is alert.  Skin: Skin is warm and dry.    Data Reviewed Left breast mammogram dated Dec 19, 2012 showed the previously identified areas of increased density in left breast unchanged. An area in the prepectoral area in the upper outer area of the left breast is more  Conspicuous in the past exams. BI-RAD-4.  Previous bilateral mammogram dated June 19, 2012 showed changes left breast which were indeterminate working a 6 month followup. BI-RAD-3.  Ultrasound examination of the left breast was completed. This shows a hypoechoic mass measuring 0.8 x 0.4 x 1.1 cm in the 2:00 position of the left breast 7 cm from the nipple. Acoustic shadowing is identified.   The patient was advised that biopsy was indicated. She was amenable to proceed.   10 cc of 0.5% Xylocaine with 0.25% Marcaine with 1-200,000 units of epinephrine was utilized well-tolerated. Lower prep was applied to the skin. A 14-gauge Finesse IP device was used to obtain approximately 8 core samples. Scant bleeding was noted. A postbiopsy clip was placed. The skin defect was closed with benzoin and Steri-Strips followed by Telfa and Tegaderm dressing. Written instructions were provided for wound  care.  Assessment    Developing left breast mass.     Plan    The patient will be contacted when the biopsy results are available.        [name] 01/11/2013, 8:28 PM

## 2013-01-12 ENCOUNTER — Encounter: Payer: Self-pay | Admitting: General Surgery

## 2013-01-15 ENCOUNTER — Encounter: Payer: Self-pay | Admitting: General Surgery

## 2013-01-15 LAB — PATHOLOGY

## 2013-01-16 ENCOUNTER — Encounter: Payer: Self-pay | Admitting: General Surgery

## 2013-01-16 ENCOUNTER — Other Ambulatory Visit: Payer: Self-pay | Admitting: General Surgery

## 2013-01-16 ENCOUNTER — Ambulatory Visit (INDEPENDENT_AMBULATORY_CARE_PROVIDER_SITE_OTHER): Payer: Medicare Other | Admitting: General Surgery

## 2013-01-16 VITALS — BP 122/66 | HR 86 | Resp 16 | Ht 64.0 in | Wt 183.0 lb

## 2013-01-16 DIAGNOSIS — D0512 Intraductal carcinoma in situ of left breast: Secondary | ICD-10-CM

## 2013-01-16 DIAGNOSIS — C50919 Malignant neoplasm of unspecified site of unspecified female breast: Secondary | ICD-10-CM

## 2013-01-16 NOTE — Progress Notes (Addendum)
Patient ID: Helen Marshall, female   DOB: May 07, 1946, 67 y.o.   MRN: [id number]  Chief Complaint  Patient presents with  . Other    Breast Discussion    HPI Helen Marshall is a 67 y.o. female who presents for a breast discussion. She had been contacted earlier regarding the results of her recently completed left breast biopsy. This showed DCIS. She was accompanied by her daughter and her husband.  HPI  Past Medical History  Diagnosis Date  . Hypertension   . Hypercholesterolemia   . IBS (irritable bowel syndrome)   . GERD (gastroesophageal reflux disease)   . Carotid bruit     left  . Diverticulosis   . Hyperglycemia   . Lumbar disc disease   . Environmental allergies   . Heart murmur   . Diverticulosis 2012  . Abnormal mammogram 12/19/2012    left    Past Surgical History  Procedure Laterality Date  . Cholecystectomy  2001  . Rotator cuff surgery  1998  . Nasal sinus surgery  1997  . Tubal ligation  1976  . Breast surgery Left 1970    biopsy  . Abdominal hysterectomy  1989    fibroids  . Colonoscopy  2012  . Ankle surgery Left 1999    Family History  Problem Relation Age of Onset  . CVA Father   . Alcohol abuse Father   . Heart disease Mother     myocardial infarction  . Diabetes Mother   . Hypertension Brother     x2  . Alcohol abuse Brother   . Arthritis/Rheumatoid Sister     x2  . Cancer Sister 34    breast   . Osteoarthritis Sister   . Asthma Sister   . Hypothyroidism Sister   . Cancer Other     breast - paternal first cousin    Social History History  Substance Use Topics  . Smoking status: Never Smoker   . Smokeless tobacco: Never Used  . Alcohol Use: No    Allergies  Allergen Reactions  . Crestor (Rosuvastatin) Other (See Comments)    Leg cramping   . Demerol (Meperidine) Nausea And Vomiting  . Latex Other (See Comments)    Redness, hands breakout  . Lipitor (Atorvastatin) Other (See Comments)    Intolerant   . Lovastatin Other  (See Comments)    Elevated CK  . Pravastatin Other (See Comments)    intolerant  . Vicodin (Hydrocodone-Acetaminophen) Other (See Comments)    itching  . Zocor (Simvastatin) Other (See Comments)    Intolerant     Current Outpatient Prescriptions  Medication Sig Dispense Refill  . amLODipine (NORVASC) 5 MG tablet Take 1 tablet (5 mg total) by mouth daily.  90 tablet  3  . aspirin 325 MG EC tablet Take 325 mg by mouth daily.      . benazepril (LOTENSIN) 40 MG tablet Take 1 tablet (40 mg total) by mouth daily.  30 tablet  6  . calcium-vitamin D (CALCIUM 500+D) 500-400 MG-UNIT per tablet Take 1 tablet by mouth daily.       . fish oil-omega-3 fatty acids 1000 MG capsule 5 (five) times daily.       . fluticasone (FLONASE) 50 MCG/ACT nasal spray Place 2 sprays into the nose daily.      . hydrochlorothiazide (HYDRODIURIL) 25 MG tablet Take 1 tablet (25 mg total) by mouth daily.  30 tablet  6  . lidocaine (LIDODERM) 5 % Place 1  patch onto the skin daily. Remove & Discard patch within 12 hours or as directed by MD  30 patch  0  . Multiple Vitamin (MULTIVITAMIN) tablet Take 1 tablet by mouth daily.      . niacin (NIASPAN) 1000 MG CR tablet Take 1,000 mg by mouth 2 (two) times daily.      . pantoprazole (PROTONIX) 40 MG tablet Take 1 tablet (40 mg total) by mouth 2 (two) times daily.  60 tablet  5  . potassium chloride (K-DUR) 10 MEQ tablet Take 1 tablet (10 mEq total) by mouth 2 (two) times daily.  60 tablet  0   No current facility-administered medications for this visit.    Review of Systems Review of Systems  Constitutional: Negative.   Respiratory: Negative.   Cardiovascular: Negative.     Blood pressure 122/66, pulse 86, resp. rate 16, height 5\' 4"  (1.626 m), weight 183 lb (83.008 kg), last menstrual period 07/21/1988.  Physical Exam Physical Exam  Data Reviewed Pathology showed DCIS, histologic grade 2. Initial discussions with the pathologist raised a question of a possible  invasive component which could not be confirmed.  Assessment    DCIS left breast.     Plan    Options for management were reviewed: 1) mastectomy versus 2) wide excision followed by postoperative radiation therapy.  Considering the initial question regarding invasive disease it is felt appropriate to complete a sentinel node biopsy at the time of her wide excision.   The patient's family history was reviewed. She has a older sister who had LCIS treated by wide excision followed by antiestrogen therapy. (SSW). A second sister (RMW) has undergone cyst aspiration in the past with no malignancy identified. It is unlikely she will qualify for BRCA testing, but if she does have invasive cancer this will be investigated.     Patient's surgery has been scheduled for 01-22-13 at [facility]. This patient has been asked to decrease current 325 mg aspirin to 81 mg aspirin starting tomorrow, 01-17-13.  The patient called on 01/17/2013 to discuss possible repair of her umbilical hernia, breast surgery. She reports this was repaired originally in 1999 at the time of cholecystectomy completed in [place]. The hernias been present for about 6-12 months. She's not clearly aware of any distinct increase in size.  We discussed the pros and cons of hernia surgery and the possibility of primary versus prosthetic mesh repair. It seems reasonable I she'll be under general anesthesia to complete this at the same setting.   [name] 01/16/2013, 8:54 PM

## 2013-01-16 NOTE — Patient Instructions (Signed)
Patient's surgery has been scheduled for 01-22-13 at [facility]. This patient has been asked to decrease current 325 mg aspirin to 81 mg aspirin starting tomorrow.

## 2013-01-17 ENCOUNTER — Other Ambulatory Visit: Payer: Self-pay | Admitting: General Surgery

## 2013-01-17 ENCOUNTER — Telehealth: Payer: Self-pay | Admitting: General Surgery

## 2013-01-17 DIAGNOSIS — K429 Umbilical hernia without obstruction or gangrene: Secondary | ICD-10-CM

## 2013-01-17 DIAGNOSIS — D0512 Intraductal carcinoma in situ of left breast: Secondary | ICD-10-CM

## 2013-01-17 NOTE — Telephone Encounter (Signed)
The patient called the office to request repair of umbilical hernia at the time of the wide excision planned for the DCIS in the left breast. We discussed the indications for hernia repair. Pros and cons of elective repair was reviewed. We'll plan to repair the middle hernia at the time of her upcoming breast wide excision.

## 2013-01-18 ENCOUNTER — Ambulatory Visit: Payer: Medicare Other

## 2013-01-19 ENCOUNTER — Ambulatory Visit: Payer: Self-pay | Admitting: General Surgery

## 2013-01-22 ENCOUNTER — Ambulatory Visit: Payer: Self-pay | Admitting: General Surgery

## 2013-01-22 DIAGNOSIS — K429 Umbilical hernia without obstruction or gangrene: Secondary | ICD-10-CM

## 2013-01-22 DIAGNOSIS — C50419 Malignant neoplasm of upper-outer quadrant of unspecified female breast: Secondary | ICD-10-CM

## 2013-01-22 HISTORY — PX: HERNIA REPAIR: SHX51

## 2013-01-22 HISTORY — DX: Malignant neoplasm of upper-outer quadrant of unspecified female breast: C50.419

## 2013-01-23 ENCOUNTER — Encounter: Payer: Self-pay | Admitting: General Surgery

## 2013-01-24 ENCOUNTER — Encounter: Payer: Self-pay | Admitting: General Surgery

## 2013-01-25 ENCOUNTER — Encounter: Payer: Self-pay | Admitting: General Surgery

## 2013-01-25 ENCOUNTER — Ambulatory Visit (INDEPENDENT_AMBULATORY_CARE_PROVIDER_SITE_OTHER): Payer: Medicare Other | Admitting: General Surgery

## 2013-01-25 VITALS — BP 118/78 | HR 78 | Resp 14 | Ht 64.0 in | Wt 184.0 lb

## 2013-01-25 DIAGNOSIS — K429 Umbilical hernia without obstruction or gangrene: Secondary | ICD-10-CM

## 2013-01-25 DIAGNOSIS — N63 Unspecified lump in unspecified breast: Secondary | ICD-10-CM

## 2013-01-25 NOTE — Patient Instructions (Signed)
Patient to return in ten days  

## 2013-01-25 NOTE — Progress Notes (Signed)
Patient ID: Helen Marshall, female   DOB: 1946/03/28, 67 y.o.   MRN: [id number]  Chief Complaint  Patient presents with  . Routine Post Op    left breast wide excision    HPI Helen Marshall is a 67 y.o. female here toda for an left breast wide excision and umbilical hernia repair done on 01/22/13.  HPI  Past Medical History  Diagnosis Date  . Hypertension   . Hypercholesterolemia   . IBS (irritable bowel syndrome)   . GERD (gastroesophageal reflux disease)   . Carotid bruit     left  . Diverticulosis   . Hyperglycemia   . Lumbar disc disease   . Environmental allergies   . Heart murmur   . Diverticulosis 2012  . Abnormal mammogram 12/19/2012    left    Past Surgical History  Procedure Laterality Date  . Cholecystectomy  2001  . Rotator cuff surgery  1998  . Nasal sinus surgery  1997  . Tubal ligation  1976  . Breast surgery Left 1970    biopsy  . Abdominal hysterectomy  1989    fibroids  . Colonoscopy  2012  . Ankle surgery Left 1999    Family History  Problem Relation Age of Onset  . CVA Father   . Alcohol abuse Father   . Heart disease Mother     myocardial infarction  . Diabetes Mother   . Hypertension Brother     x2  . Alcohol abuse Brother   . Arthritis/Rheumatoid Sister     x2  . Cancer Sister 60    breast   . Osteoarthritis Sister   . Asthma Sister   . Hypothyroidism Sister   . Cancer Other     breast - paternal first cousin    Social History History  Substance Use Topics  . Smoking status: Never Smoker   . Smokeless tobacco: Never Used  . Alcohol Use: No    Allergies  Allergen Reactions  . Crestor (Rosuvastatin) Other (See Comments)    Leg cramping   . Demerol (Meperidine) Nausea And Vomiting  . Latex Other (See Comments)    Redness, hands breakout  . Lipitor (Atorvastatin) Other (See Comments)    Intolerant   . Lovastatin Other (See Comments)    Elevated CK  . Pravastatin Other (See Comments)    intolerant  . Vicodin  (Hydrocodone-Acetaminophen) Other (See Comments)    itching  . Zocor (Simvastatin) Other (See Comments)    Intolerant     Current Outpatient Prescriptions  Medication Sig Dispense Refill  . traMADol (ULTRAM) 50 MG tablet Take 1 tablet by mouth daily.      Marland Kitchen amLODipine (NORVASC) 5 MG tablet Take 1 tablet (5 mg total) by mouth daily.  90 tablet  3  . aspirin 325 MG EC tablet Take 325 mg by mouth daily.      . benazepril (LOTENSIN) 40 MG tablet Take 1 tablet (40 mg total) by mouth daily.  30 tablet  6  . calcium-vitamin D (CALCIUM 500+D) 500-400 MG-UNIT per tablet Take 1 tablet by mouth daily.       . fish oil-omega-3 fatty acids 1000 MG capsule 5 (five) times daily.       . fluticasone (FLONASE) 50 MCG/ACT nasal spray Place 2 sprays into the nose daily.      . hydrochlorothiazide (HYDRODIURIL) 25 MG tablet Take 1 tablet (25 mg total) by mouth daily.  30 tablet  6  . lidocaine (LIDODERM) 5 %  Place 1 patch onto the skin daily. Remove & Discard patch within 12 hours or as directed by MD  30 patch  0  . Multiple Vitamin (MULTIVITAMIN) tablet Take 1 tablet by mouth daily.      . niacin (NIASPAN) 1000 MG CR tablet Take 1,000 mg by mouth 2 (two) times daily.      . pantoprazole (PROTONIX) 40 MG tablet Take 1 tablet (40 mg total) by mouth 2 (two) times daily.  60 tablet  5  . potassium chloride (K-DUR) 10 MEQ tablet Take 1 tablet (10 mEq total) by mouth 2 (two) times daily.  60 tablet  0   No current facility-administered medications for this visit.    Review of Systems Review of Systems  Constitutional: Negative.   Respiratory: Negative.   Cardiovascular: Negative.     Blood pressure 118/78, pulse 78, resp. rate 14, height 5\' 4"  (1.626 m), weight 184 lb (83.462 kg), last menstrual period 07/21/1988.  Physical Exam Physical Exam  Constitutional: She is oriented to person, place, and time. She appears well-developed and well-nourished.  Pulmonary/Chest:    Left breast healing well  alittle redness above the main excision.   Abdominal:    2 by 3 cm redness about the umbilicus area   Neurological: She is alert and oriented to person, place, and time.  Skin: Skin is warm and dry.    Data Reviewed Pathology showed DCIS alone, nuclear grade 1. An adjacent complex sclerotic lesion measuring 6 mm in diameter was evident. Margins were all widely clear. Sentinel lymph node was negative. ER/PR pending. pTis.  Assessment    Doing well status post wide excision a left breast DCIS and umbilical hernia repair.     Plan    We'll plan for careful observation of the faint areas of erythema near both wounds. She'll report any change, otherwise follow up will be within 10 days. We'll complete an ultrasound at her next visit to assess whether she is a candidate for partial breast radiation.        [name] 01/25/2013, 9:06 PM

## 2013-01-29 LAB — PATHOLOGY REPORT

## 2013-01-30 ENCOUNTER — Encounter: Payer: Self-pay | Admitting: General Surgery

## 2013-01-31 ENCOUNTER — Telehealth: Payer: Self-pay

## 2013-01-31 NOTE — Telephone Encounter (Signed)
Small amount of itching and used cortisone cream, which helped. She will call back if it seems to be getting worse. appt is for Tuesday 02-06-13

## 2013-01-31 NOTE — Telephone Encounter (Signed)
Patient states had a left lumpectomy on June 23rd. For the past two days she has had some redness around the left nipple as well as some red dots around the nipple. Reports no burning or fevers. She was concerned weather this was normal or not after a lumpectomy.

## 2013-02-06 ENCOUNTER — Encounter: Payer: Self-pay | Admitting: General Surgery

## 2013-02-06 ENCOUNTER — Ambulatory Visit: Payer: Self-pay | Admitting: General Surgery

## 2013-02-06 ENCOUNTER — Ambulatory Visit (INDEPENDENT_AMBULATORY_CARE_PROVIDER_SITE_OTHER): Payer: Medicare Other | Admitting: General Surgery

## 2013-02-06 VITALS — BP 138/76 | HR 76 | Resp 13 | Ht 64.0 in | Wt 184.0 lb

## 2013-02-06 DIAGNOSIS — D0512 Intraductal carcinoma in situ of left breast: Secondary | ICD-10-CM

## 2013-02-06 DIAGNOSIS — C50919 Malignant neoplasm of unspecified site of unspecified female breast: Secondary | ICD-10-CM

## 2013-02-06 NOTE — Patient Instructions (Addendum)
Appointment with Dr Aggie Cosier to discuss radiation treatment.

## 2013-02-06 NOTE — Progress Notes (Signed)
Patient ID: Helen Marshall, female   DOB: [birth year], 67 y.o.   MRN: [id number] Patient here for post op follow up of left breast wide excision and umbilical hernia repair.  Patient states was unable to take the Tramadol prescribed for pain as it made her very nauseated. No reports of vomiting. Patient states the pain has gotten better and is very manageable without the Tramadol. Has used Aleve for pain with relief. Patient reports no drainage or redness at surgery sites.   Who is ultrasound examination of the wide excision site in the upper quadrant of left breast shows a small ulcer on the cavity approximately 1.2-1.3 cm below the skin. This appears to be adequate for MammoSite placement.  Indications for postoperative radiotherapy were reviewed with the patient. She is a candidate for either accelerated partial breast radiation or whole breast radiation, the best option will be determined by the radiation oncologist.   She is an ER/PR positive tumor, and will be a candidate for antiestrogen therapy after radiation therapy is completed.

## 2013-02-07 ENCOUNTER — Ambulatory Visit: Payer: Self-pay | Admitting: Radiation Oncology

## 2013-02-07 ENCOUNTER — Telehealth: Payer: Self-pay | Admitting: *Deleted

## 2013-02-07 ENCOUNTER — Encounter: Payer: Self-pay | Admitting: General Surgery

## 2013-02-07 DIAGNOSIS — C50919 Malignant neoplasm of unspecified site of unspecified female breast: Secondary | ICD-10-CM

## 2013-02-07 NOTE — Telephone Encounter (Signed)
Patient has been scheduled for an appointment with Dr. Rushie Chestnut at the [facility] for 02-12-13 at 2 pm. This patient is aware of date, time, and instructions. Records have been forwarded to their office.

## 2013-02-12 ENCOUNTER — Ambulatory Visit: Payer: Self-pay | Admitting: Radiation Oncology

## 2013-02-13 ENCOUNTER — Telehealth: Payer: Self-pay | Admitting: *Deleted

## 2013-02-13 NOTE — Telephone Encounter (Signed)
Toniann Fail from the [facility] called and left a message for me since Mindi Junker is out this week regarding patient being a good candidate for left breast mammosite placement per Dr. Rushie Chestnut. I have left her a message for Toniann Fail to call me back. Will patient require a pre-mammosite visit with ultrasound? You are only hear next Thursday, 02-22-13, in the office. Let me know if I need to do anything else or if it can wait till Mindi Junker returns next week. See scheduling sheet I left on your desk. Thanks.

## 2013-02-13 NOTE — Telephone Encounter (Signed)
Toniann Fail from the [facility] called back to say that it doesn't matter when we do the mammosite and to just let her know when it's scheduled for. The only time Dr. Rushie Chestnut has off is August 12-15th.

## 2013-02-14 ENCOUNTER — Telehealth: Payer: Self-pay | Admitting: *Deleted

## 2013-02-14 NOTE — Telephone Encounter (Signed)
Message copied by Nicholes Mango on Wed Feb 14, 2013 11:25 AM ------      Message from: Lemar Livings, Merrily Pew      Created: Tue Feb 13, 2013  5:27 PM       Standard 4-5 cm balloon. Duricef RX ( 500mg , # 20; 1 po BID to start prior to placement. Confirm that Thursday, July 24 would work with Toniann Fail at radiation center. Thanks. ------

## 2013-02-14 NOTE — Telephone Encounter (Signed)
Patient has been scheduled for a mammosite placement for 02-22-13 at 8 am. This patient is aware of date and time. Toniann Fail at the [facility] said that this will work for them. Toniann Fail will contact patient to work out the details for radiation but they are planning to do a CT scan on 02-23-13 at 11 am. Treatments will be 02-26-13 through 03-02-13.

## 2013-02-20 ENCOUNTER — Telehealth: Payer: Self-pay | Admitting: *Deleted

## 2013-02-20 MED ORDER — CEFADROXIL 500 MG PO CAPS: 500.0000 mg | ORAL_CAPSULE | Freq: Two times a day (BID) | ORAL | Status: DC

## 2013-02-20 NOTE — Telephone Encounter (Signed)
Mammosite schedule reviewed with the patient. Aware of antibiotic. Aware no showers and to wear her bra while mammosite in place. Pt agrees.

## 2013-02-22 ENCOUNTER — Encounter: Payer: Self-pay | Admitting: General Surgery

## 2013-02-22 ENCOUNTER — Ambulatory Visit (INDEPENDENT_AMBULATORY_CARE_PROVIDER_SITE_OTHER): Payer: Medicare Other | Admitting: General Surgery

## 2013-02-22 VITALS — BP 110/64 | HR 64 | Resp 14 | Ht 64.0 in | Wt 181.0 lb

## 2013-02-22 DIAGNOSIS — D0512 Intraductal carcinoma in situ of left breast: Secondary | ICD-10-CM

## 2013-02-22 DIAGNOSIS — C50919 Malignant neoplasm of unspecified site of unspecified female breast: Secondary | ICD-10-CM

## 2013-02-22 NOTE — Progress Notes (Signed)
Patient ID: Helen Marshall, female   DOB: [birth year], 67 y.o.   MRN: [id number]  Chief Complaint  Patient presents with  . Other    mammosite left breast    HPI Helen Marshall is a 67 y.o. female. The patient has been evaluated by radiation oncology and felt to be a candidate for accelerated partial breast radiation. She is here today for left breast mammosite placement. HPI  Past Medical History  Diagnosis Date  . Hypertension   . Hypercholesterolemia   . IBS (irritable bowel syndrome)   . GERD (gastroesophageal reflux disease)   . Carotid bruit     left  . Diverticulosis   . Hyperglycemia   . Lumbar disc disease   . Environmental allergies   . Heart murmur   . Diverticulosis 2012  . Abnormal mammogram 12/19/2012    left    Past Surgical History  Procedure Laterality Date  . Cholecystectomy  2001  . Rotator cuff surgery  1998  . Nasal sinus surgery  1997  . Tubal ligation  1976  . Breast surgery Left 1970    biopsy  . Abdominal hysterectomy  1989    fibroids  . Colonoscopy  2012  . Ankle surgery Left 1999    Family History  Problem Relation Age of Onset  . CVA Father   . Alcohol abuse Father   . Heart disease Mother     myocardial infarction  . Diabetes Mother   . Hypertension Brother     x2  . Alcohol abuse Brother   . Arthritis/Rheumatoid Sister     x2  . Cancer Sister 27    breast   . Osteoarthritis Sister   . Asthma Sister   . Hypothyroidism Sister   . Cancer Other     breast - paternal first cousin    Social History History  Substance Use Topics  . Smoking status: Never Smoker   . Smokeless tobacco: Never Used  . Alcohol Use: No    Allergies  Allergen Reactions  . Crestor (Rosuvastatin) Other (See Comments)    Leg cramping   . Demerol (Meperidine) Nausea And Vomiting  . Latex Other (See Comments)    Redness, hands breakout  . Lipitor (Atorvastatin) Other (See Comments)    Intolerant   . Lovastatin Other (See Comments)     Elevated CK  . Pravastatin Other (See Comments)    intolerant  . Tramadol Nausea Only  . Vicodin (Hydrocodone-Acetaminophen) Other (See Comments)    itching  . Zocor (Simvastatin) Other (See Comments)    Intolerant     Current Outpatient Prescriptions  Medication Sig Dispense Refill  . amLODipine (NORVASC) 5 MG tablet Take 1 tablet (5 mg total) by mouth daily.  90 tablet  3  . aspirin 325 MG EC tablet Take 325 mg by mouth daily.      . benazepril (LOTENSIN) 40 MG tablet Take 1 tablet (40 mg total) by mouth daily.  30 tablet  6  . calcium-vitamin D (CALCIUM 500+D) 500-400 MG-UNIT per tablet Take 1 tablet by mouth daily.       . cefadroxil (DURICEF) 500 MG capsule Take 1 capsule (500 mg total) by mouth 2 (two) times daily. Start 1 hour prior to procedure  20 capsule  0  . fish oil-omega-3 fatty acids 1000 MG capsule 5 (five) times daily.       . fluticasone (FLONASE) 50 MCG/ACT nasal spray Place 2 sprays into the nose daily.      Marland Kitchen  hydrochlorothiazide (HYDRODIURIL) 25 MG tablet Take 1 tablet (25 mg total) by mouth daily.  30 tablet  6  . lidocaine (LIDODERM) 5 % Place 1 patch onto the skin daily. Remove & Discard patch within 12 hours or as directed by MD  30 patch  0  . Multiple Vitamin (MULTIVITAMIN) tablet Take 1 tablet by mouth daily.      . niacin (NIASPAN) 1000 MG CR tablet Take 1,000 mg by mouth 2 (two) times daily.      . pantoprazole (PROTONIX) 40 MG tablet Take 1 tablet (40 mg total) by mouth 2 (two) times daily.  60 tablet  5  . potassium chloride (K-DUR) 10 MEQ tablet Take 1 tablet (10 mEq total) by mouth 2 (two) times daily.  60 tablet  0   No current facility-administered medications for this visit.    Review of Systems Review of Systems  Constitutional: Negative.   Respiratory: Negative.   Cardiovascular: Negative.     Blood pressure 110/64, pulse 64, resp. rate 14, height 5\' 4"  (1.626 m), weight 181 lb (82.101 kg), last menstrual period 07/21/1988.  Physical  Exam Physical Exam Examination her left breast shows excellent healing at the wide excision site.  Data Reviewed Previous ultrasound shows adequate spacing for MammoSite balloon placement.  Assessment    Left breast cancer, candidate for accelerated partial breast radiation.     Plan    Alcohol prep followed by 10 cc of 0.5% Xylocaine with 0.25% Marcaine with one 200,000 of epinephrine was utilized well tolerated. ChloraPrep was applied to the skin. The trial balloon was then placed under ultrasound guidance. This showed adequate spacing with a minimal distance of 1.2 cm.   The trial balloon was removed and the treatment balloon placed. This required some negotiation to get the balloon appropriately seated, but this was eventually successfully done. The balloon was inflated to 40 cc volume with a mixture of saline and Omnipaque.  A minimal distance of 1.5 cm appreciated. Bacitracin was applied to the balloon exit site followed by dry dressing.  The patient will return to the cancer Center tomorrow for post procedure CT scan and initiation of radiation therapy. We'll plan for a followup examination here in one month.            [name] 02/23/2013, 3:04 PM

## 2013-02-22 NOTE — Patient Instructions (Addendum)
Patient care kit given to patient.  Instructed no showers, sponge bath while mammosite in place, take antibiotic. Follow up with Cancer Center as arranged. Discussed wearing your bra for support at all times. 

## 2013-02-23 ENCOUNTER — Encounter: Payer: Self-pay | Admitting: General Surgery

## 2013-02-26 ENCOUNTER — Encounter: Payer: Self-pay | Admitting: General Surgery

## 2013-03-01 ENCOUNTER — Other Ambulatory Visit: Payer: Self-pay | Admitting: *Deleted

## 2013-03-01 MED ORDER — POTASSIUM CHLORIDE ER 10 MEQ PO TBCR: 10.0000 meq | EXTENDED_RELEASE_TABLET | Freq: Two times a day (BID) | ORAL | Status: DC

## 2013-03-02 ENCOUNTER — Ambulatory Visit: Payer: Self-pay | Admitting: Radiation Oncology

## 2013-03-07 ENCOUNTER — Other Ambulatory Visit: Payer: Self-pay

## 2013-03-26 ENCOUNTER — Ambulatory Visit (INDEPENDENT_AMBULATORY_CARE_PROVIDER_SITE_OTHER): Payer: Medicare Other | Admitting: General Surgery

## 2013-03-26 ENCOUNTER — Encounter: Payer: Self-pay | Admitting: General Surgery

## 2013-03-26 VITALS — BP 130/70 | HR 80 | Resp 20 | Ht 64.0 in | Wt 196.0 lb

## 2013-03-26 DIAGNOSIS — C50919 Malignant neoplasm of unspecified site of unspecified female breast: Secondary | ICD-10-CM

## 2013-03-26 MED ORDER — TAMOXIFEN CITRATE 20 MG PO TABS: 20.0000 mg | ORAL_TABLET | Freq: Every day | ORAL | Status: DC

## 2013-03-26 NOTE — Progress Notes (Signed)
Patient ID: Helen Marshall, female   DOB: 08/04/1945, 67 y.o.   MRN: [id number]  Chief Complaint  Patient presents with  . Follow-up    mammosite    HPI Helen Marshall is a 67 y.o. female. here today following up from her mammosite place.done on 02/22/13. Patient state was removed 03/02/13.  Presently feeling well, with minimal soreness in the treated breast.HPI  Past Medical History  Diagnosis Date  . Hypertension   . Hypercholesterolemia   . IBS (irritable bowel syndrome)   . GERD (gastroesophageal reflux disease)   . Carotid bruit     left  . Diverticulosis   . Hyperglycemia   . Lumbar disc disease   . Environmental allergies   . Heart murmur   . Diverticulosis 2012  . Abnormal mammogram 12/19/2012    left    Past Surgical History  Procedure Laterality Date  . Cholecystectomy  2001  . Rotator cuff surgery  1998  . Nasal sinus surgery  1997  . Tubal ligation  1976  . Breast surgery Left 1970    biopsy  . Abdominal hysterectomy  1989    fibroids  . Colonoscopy  2012  . Ankle surgery Left 1999    Family History  Problem Relation Age of Onset  . CVA Father   . Alcohol abuse Father   . Heart disease Mother     myocardial infarction  . Diabetes Mother   . Hypertension Brother     x2  . Alcohol abuse Brother   . Arthritis/Rheumatoid Sister     x2  . Cancer Sister 34    breast   . Osteoarthritis Sister   . Asthma Sister   . Hypothyroidism Sister   . Cancer Other     breast - paternal first cousin    Social History History  Substance Use Topics  . Smoking status: Never Smoker   . Smokeless tobacco: Never Used  . Alcohol Use: No    Allergies  Allergen Reactions  . Crestor [Rosuvastatin] Other (See Comments)    Leg cramping   . Demerol [Meperidine] Nausea And Vomiting  . Latex Other (See Comments)    Redness, hands breakout  . Lipitor [Atorvastatin] Other (See Comments)    Intolerant   . Lovastatin Other (See Comments)    Elevated CK  .  Pravastatin Other (See Comments)    intolerant  . Tramadol Nausea Only  . Vicodin [Hydrocodone-Acetaminophen] Other (See Comments)    itching  . Zocor [Simvastatin] Other (See Comments)    Intolerant     Current Outpatient Prescriptions  Medication Sig Dispense Refill  . amLODipine (NORVASC) 5 MG tablet Take 1 tablet (5 mg total) by mouth daily.  90 tablet  3  . aspirin 325 MG EC tablet Take 325 mg by mouth daily.      . benazepril (LOTENSIN) 40 MG tablet Take 1 tablet (40 mg total) by mouth daily.  30 tablet  6  . calcium-vitamin D (CALCIUM 500+D) 500-400 MG-UNIT per tablet Take 1 tablet by mouth daily.       . cefadroxil (DURICEF) 500 MG capsule Take 1 capsule (500 mg total) by mouth 2 (two) times daily. Start 1 hour prior to procedure  20 capsule  0  . fish oil-omega-3 fatty acids 1000 MG capsule 5 (five) times daily.       . fluticasone (FLONASE) 50 MCG/ACT nasal spray Place 2 sprays into the nose daily.      . hydrochlorothiazide (HYDRODIURIL)  25 MG tablet Take 1 tablet (25 mg total) by mouth daily.  30 tablet  6  . lidocaine (LIDODERM) 5 % Place 1 patch onto the skin daily. Remove & Discard patch within 12 hours or as directed by MD  30 patch  0  . Multiple Vitamin (MULTIVITAMIN) tablet Take 1 tablet by mouth daily.      . niacin (NIASPAN) 1000 MG CR tablet Take 1,000 mg by mouth 2 (two) times daily.      . pantoprazole (PROTONIX) 40 MG tablet Take 1 tablet (40 mg total) by mouth 2 (two) times daily.  60 tablet  5  . potassium chloride (K-DUR) 10 MEQ tablet Take 1 tablet (10 mEq total) by mouth 2 (two) times daily.  60 tablet  2  . tamoxifen (NOLVADEX) 20 MG tablet Take 1 tablet (20 mg total) by mouth daily.  30 tablet  11   No current facility-administered medications for this visit.    Review of Systems Review of Systems  Constitutional: Negative.   Respiratory: Negative.   Cardiovascular: Negative.     Blood pressure 130/70, pulse 80, resp. rate 20, height 5\' 4"  (1.626  m), weight 196 lb (88.905 kg), last menstrual period 07/21/1988.  Physical Exam Physical Exam The incisions are well-healed. No evidence of induration or thickening. No evidence of seroma formation. Data Reviewed DCIS, nuclear grade 1. ER/PR positive. Assessment    Doing well status post wide excision and partial breast radiation.    Plan    The radiation oncologist admitted appointment for the patient to meet with medical oncology to discuss antiestrogen therapy. I told the patient I was comfortable with this, or would be glad to prescribe the tamoxifen myself. We discussed the pros and cons of the additional $40 co-pay, and at this time she can start tamoxifen 20 mg daily through this office.  There is past history of LFT abnormality, but her most recent studies are normal.  The patient will continue to make use of her daily 325 mg aspirin.  Side effects of the antiestrogen therapy were reviewed, and she was encouraged to take medication for one month prior to discontinuing this has most side effects abate within week for.  She was asked to a full follow up in one month. Office followup in December 2014 with a left breast diagnostic mammogram at that time.        [name] 03/27/2013, 11:48 AM

## 2013-03-26 NOTE — Patient Instructions (Addendum)
Patient to return in December 2014. Rx sent to pharmacy

## 2013-03-27 ENCOUNTER — Encounter: Payer: Self-pay | Admitting: General Surgery

## 2013-04-02 ENCOUNTER — Ambulatory Visit: Payer: Self-pay | Admitting: Radiation Oncology

## 2013-05-02 HISTORY — PX: SQUAMOUS CELL CARCINOMA EXCISION: SHX2433

## 2013-05-04 ENCOUNTER — Other Ambulatory Visit: Payer: Medicare Other

## 2013-05-07 ENCOUNTER — Other Ambulatory Visit (INDEPENDENT_AMBULATORY_CARE_PROVIDER_SITE_OTHER): Payer: Medicare Other

## 2013-05-07 DIAGNOSIS — R7989 Other specified abnormal findings of blood chemistry: Secondary | ICD-10-CM

## 2013-05-07 DIAGNOSIS — R945 Abnormal results of liver function studies: Secondary | ICD-10-CM

## 2013-05-07 DIAGNOSIS — E7800 Pure hypercholesterolemia, unspecified: Secondary | ICD-10-CM

## 2013-05-07 DIAGNOSIS — R7309 Other abnormal glucose: Secondary | ICD-10-CM

## 2013-05-07 DIAGNOSIS — R739 Hyperglycemia, unspecified: Secondary | ICD-10-CM

## 2013-05-07 DIAGNOSIS — I10 Essential (primary) hypertension: Secondary | ICD-10-CM

## 2013-05-07 LAB — HEPATIC FUNCTION PANEL
ALT: 21 U/L (ref 0–35)
AST: 26 U/L (ref 0–37)
Total Bilirubin: 0.5 mg/dL (ref 0.3–1.2)
Total Protein: 6.4 g/dL (ref 6.0–8.3)

## 2013-05-07 LAB — BASIC METABOLIC PANEL
CO2: 26 mEq/L (ref 19–32)
Calcium: 8.9 mg/dL (ref 8.4–10.5)
Creatinine, Ser: 0.8 mg/dL (ref 0.4–1.2)
GFR: 81.73 mL/min (ref >60.00)
Sodium: 136 mEq/L (ref 135–145)

## 2013-05-07 LAB — HEMOGLOBIN A1C: Hgb A1c MFr Bld: 6.4 % (ref 4.6–6.5)

## 2013-05-07 LAB — LIPID PANEL: Triglycerides: 74 mg/dL (ref 0.0–149.0)

## 2013-05-08 ENCOUNTER — Encounter: Payer: Self-pay | Admitting: Internal Medicine

## 2013-05-09 ENCOUNTER — Ambulatory Visit (INDEPENDENT_AMBULATORY_CARE_PROVIDER_SITE_OTHER): Payer: Medicare Other | Admitting: Internal Medicine

## 2013-05-09 ENCOUNTER — Encounter: Payer: Self-pay | Admitting: Internal Medicine

## 2013-05-09 VITALS — BP 118/70 | HR 65 | Temp 98.2°F | Ht 64.0 in | Wt 185.2 lb

## 2013-05-09 DIAGNOSIS — K5790 Diverticulosis of intestine, part unspecified, without perforation or abscess without bleeding: Secondary | ICD-10-CM

## 2013-05-09 DIAGNOSIS — R739 Hyperglycemia, unspecified: Secondary | ICD-10-CM

## 2013-05-09 DIAGNOSIS — I10 Essential (primary) hypertension: Secondary | ICD-10-CM

## 2013-05-09 DIAGNOSIS — Z9109 Other allergy status, other than to drugs and biological substances: Secondary | ICD-10-CM

## 2013-05-09 DIAGNOSIS — K5730 Diverticulosis of large intestine without perforation or abscess without bleeding: Secondary | ICD-10-CM

## 2013-05-09 DIAGNOSIS — R945 Abnormal results of liver function studies: Secondary | ICD-10-CM

## 2013-05-09 DIAGNOSIS — E7800 Pure hypercholesterolemia, unspecified: Secondary | ICD-10-CM

## 2013-05-09 DIAGNOSIS — C50919 Malignant neoplasm of unspecified site of unspecified female breast: Secondary | ICD-10-CM

## 2013-05-09 DIAGNOSIS — D0512 Intraductal carcinoma in situ of left breast: Secondary | ICD-10-CM

## 2013-05-09 DIAGNOSIS — K219 Gastro-esophageal reflux disease without esophagitis: Secondary | ICD-10-CM

## 2013-05-09 DIAGNOSIS — R7989 Other specified abnormal findings of blood chemistry: Secondary | ICD-10-CM

## 2013-05-09 DIAGNOSIS — R0989 Other specified symptoms and signs involving the circulatory and respiratory systems: Secondary | ICD-10-CM

## 2013-05-09 DIAGNOSIS — M519 Unspecified thoracic, thoracolumbar and lumbosacral intervertebral disc disorder: Secondary | ICD-10-CM

## 2013-05-09 DIAGNOSIS — R7309 Other abnormal glucose: Secondary | ICD-10-CM

## 2013-05-09 DIAGNOSIS — Z23 Encounter for immunization: Secondary | ICD-10-CM

## 2013-05-10 ENCOUNTER — Other Ambulatory Visit: Payer: Self-pay | Admitting: *Deleted

## 2013-05-10 MED ORDER — HYDROCHLOROTHIAZIDE 25 MG PO TABS: 25.0000 mg | ORAL_TABLET | Freq: Every day | ORAL | Status: DC

## 2013-05-10 MED ORDER — BENAZEPRIL HCL 40 MG PO TABS: 40.0000 mg | ORAL_TABLET | Freq: Every day | ORAL | Status: DC

## 2013-05-13 ENCOUNTER — Encounter: Payer: Self-pay | Admitting: Internal Medicine

## 2013-05-13 NOTE — Assessment & Plan Note (Signed)
Bowels relatively stable.  Some constipation.  Magnesium helps.  Colonoscopy 06/02/11 revealed diverticulosis and a lipoma.

## 2013-05-13 NOTE — Assessment & Plan Note (Signed)
Blood pressure is under good control.  Same meds.  Follow metabolic panel.   

## 2013-05-13 NOTE — Assessment & Plan Note (Signed)
Carotid ultrasound 9/12 revealed no significant stenosis.  Asymptomatic. Continue daily aspirin.   

## 2013-05-13 NOTE — Assessment & Plan Note (Signed)
Unable to tolerate statins.  On niacin.  Low cholesterol diet and exercise.  Follow.  Last lipid profile 05/07/13 revealed total cholesterol 206, triglycerides 74, HDL 82 and LDL 101.  Improved.

## 2013-05-13 NOTE — Assessment & Plan Note (Signed)
Followed by Dr  Lemar Livings.  On Tamoxifen.  Follow.

## 2013-05-13 NOTE — Assessment & Plan Note (Signed)
Low carb diet and exercise.  Follow met b and a1c.  Last a1c - 6.4 (05/07/13).

## 2013-05-13 NOTE — Assessment & Plan Note (Signed)
Symptoms controlled

## 2013-05-13 NOTE — Assessment & Plan Note (Signed)
Last liver panel wnl.  Follow liver panel.   

## 2013-05-13 NOTE — Assessment & Plan Note (Signed)
Seeing Dr McQueen.  Receiving allergy shots now.  Follow.   

## 2013-05-13 NOTE — Assessment & Plan Note (Signed)
Evaluated by orthopedics.  She declined epidural.  Has had physical therapy.  Follow.  Stable.

## 2013-05-13 NOTE — Progress Notes (Signed)
Subjective:    Patient ID: Helen Marshall, female    DOB: [birth year], 67 y.o.   MRN: [id number]  HPI 67 year old female with past history of hypertension, hypercholesterolemia and GERD who comes in today for a scheduled follow up.  Saw Dr Jenne Campus.  On allergy shots now.   Has some wheat allergy. No nausea or vomiting.  No blood in the stool.  No urinary change.  Feels her bowels are relatively stable.  Some constipation.  Magnesium helps.  Recenlyt diagnosed with breast cancer.  See Dr Rutherford Nail note for details.  On tamoxifen now.  Due for follow up left breast mammogram 12/14.  Overall she feels she is doing well.  Handling stress well.  Saw dermatology this am.  Had squamous cell removed right upper arm.  Due to have light laser treatments on her face in December.     Past Medical History  Diagnosis Date  . Hypertension   . Hypercholesterolemia   . IBS (irritable bowel syndrome)   . GERD (gastroesophageal reflux disease)   . Carotid bruit     left  . Diverticulosis   . Hyperglycemia   . Lumbar disc disease   . Environmental allergies   . Heart murmur   . Diverticulosis 2012  . Abnormal mammogram 12/19/2012    left    Current Outpatient Prescriptions on File Prior to Visit  Medication Sig Dispense Refill  . amLODipine (NORVASC) 5 MG tablet Take 1 tablet (5 mg total) by mouth daily.  90 tablet  3  . aspirin 325 MG EC tablet Take 325 mg by mouth daily.      . calcium-vitamin D (CALCIUM 500+D) 500-400 MG-UNIT per tablet Take 1 tablet by mouth daily.       . cefadroxil (DURICEF) 500 MG capsule Take 1 capsule (500 mg total) by mouth 2 (two) times daily. Start 1 hour prior to procedure  20 capsule  0  . fish oil-omega-3 fatty acids 1000 MG capsule 5 (five) times daily.       . fluticasone (FLONASE) 50 MCG/ACT nasal spray Place 2 sprays into the nose daily.      Marland Kitchen lidocaine (LIDODERM) 5 % Place 1 patch onto the skin daily. Remove & Discard patch within 12 hours or as directed by MD  30  patch  0  . Multiple Vitamin (MULTIVITAMIN) tablet Take 1 tablet by mouth daily.      . niacin (NIASPAN) 1000 MG CR tablet Take 1,000 mg by mouth 2 (two) times daily.      . pantoprazole (PROTONIX) 40 MG tablet Take 1 tablet (40 mg total) by mouth 2 (two) times daily.  60 tablet  5  . potassium chloride (K-DUR) 10 MEQ tablet Take 1 tablet (10 mEq total) by mouth 2 (two) times daily.  60 tablet  2  . tamoxifen (NOLVADEX) 20 MG tablet Take 1 tablet (20 mg total) by mouth daily.  30 tablet  11   No current facility-administered medications on file prior to visit.    Review of Systems Patient denies any headache, lightheadedness or dizziness. No significant sinus or allergy symptoms currently.  No chest pain, tightness or palpitations.  No increased shortness of breath, cough or congestion.  No nausea or vomiting.  No abdominal pain or cramping.  Acid reflux controlled.  No urine change.  Handling stress relatively well.  Recent diagnosis of breast cancer.  On tamoxifen.  Continues follow up with Dr Lemar Livings.      Objective:  Physical Exam  Filed Vitals:   05/09/13 1117  BP: 118/70  Pulse: 65  Temp: 98.2 F (36.8 C)   Blood pressure recheck:  120/78, pulse 23  67 year old female in no acute distress.   HEENT:  Nares- clear.  Oropharynx - without lesions. NECK:  Supple.  Nontender.  No audible bruit.  HEART:  Appears to be regular. I/VI systolic murmur.  LUNGS:  No crackles or wheezing audible.  Respirations even and unlabored.  RADIAL PULSE:  Equal bilaterally.  ABDOMEN:  Soft, nontender.  Bowel sounds present and normal.  No audible abdominal bruit.   EXTREMITIES:  No increased edema present.  DP pulses palpable and equal bilaterally.           Assessment & Plan:  HISTORY OF LEFT CAROTID BRUIT.  Carotid ultrasound 9/12 revealed no significant stenosis.  Asymptomatic.  Continue daily aspirin.   CARDIOVASCULAR.  Stable.  Continue risk factor modification.  Has a known murmur.     INCREASED PSYCHOSOCIAL STRESSORS.  On Zoloft.  Stable.  Follow.   HEALTH MAINTENANCE.  Physical 07/21/12.  Is s/p hysterectomy.  Does not require yearly pap smears.  Colonoscopy 06/02/11 revealed diverticulosis and a lipoma.  Breast cancer recently diagnosed as outlined.  Mammograms ordered through Dr Lemar Livings.  Bone density 05/26/10 normal.

## 2013-07-18 ENCOUNTER — Ambulatory Visit: Payer: Self-pay | Admitting: General Surgery

## 2013-07-19 ENCOUNTER — Encounter: Payer: Self-pay | Admitting: General Surgery

## 2013-08-13 ENCOUNTER — Encounter: Payer: Self-pay | Admitting: Internal Medicine

## 2013-08-14 ENCOUNTER — Other Ambulatory Visit: Payer: Self-pay | Admitting: *Deleted

## 2013-08-14 ENCOUNTER — Encounter: Payer: Self-pay | Admitting: General Surgery

## 2013-08-14 ENCOUNTER — Ambulatory Visit (INDEPENDENT_AMBULATORY_CARE_PROVIDER_SITE_OTHER): Payer: Medicare Other | Admitting: General Surgery

## 2013-08-14 VITALS — BP 122/78 | HR 77 | Resp 14 | Ht 64.0 in | Wt 187.0 lb

## 2013-08-14 DIAGNOSIS — C50919 Malignant neoplasm of unspecified site of unspecified female breast: Secondary | ICD-10-CM | POA: Insufficient documentation

## 2013-08-14 DIAGNOSIS — C50419 Malignant neoplasm of upper-outer quadrant of unspecified female breast: Secondary | ICD-10-CM | POA: Insufficient documentation

## 2013-08-14 MED ORDER — AMLODIPINE BESYLATE 5 MG PO TABS: 5.0000 mg | ORAL_TABLET | Freq: Every day | ORAL | Status: DC

## 2013-08-14 NOTE — Patient Instructions (Signed)
Continue self breast exams. Call office for any new breast issues or concerns. 

## 2013-08-14 NOTE — Progress Notes (Signed)
Patient ID: Helen Marshall, female   DOB: 01-May-1946, 68 y.o.   MRN: [id number]  Chief Complaint  Patient presents with  . Follow-up    mammogram    HPI Helen Marshall is a 68 y.o. female who presents for a breast evaluation. The most recent mammogram was done on 07/18/13 at [place] [facility]. Patient does perform regular self breast checks and gets regular mammograms done. She states that she is doing very well and has no new breast problems.  HPI  Past Medical History  Diagnosis Date  . Hypertension   . Hypercholesterolemia   . IBS (irritable bowel syndrome)   . GERD (gastroesophageal reflux disease)   . Carotid bruit     left  . Diverticulosis   . Hyperglycemia   . Lumbar disc disease   . Environmental allergies   . Heart murmur   . Diverticulosis 2012  . Abnormal mammogram 12/19/2012    left  . Cancer   . 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 Wide local excision, sentinel node biopsy, MammoSite partial breast radiation..    Past Surgical History  Procedure Laterality Date  . Cholecystectomy  2001  . Rotator cuff surgery  1998  . Nasal sinus surgery  1997  . Tubal ligation  1976  . Abdominal hysterectomy  1989    fibroids  . Colonoscopy  2012  . Ankle surgery Left 1999  . 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 Breast surgery Left 1970    biopsy    Family History  Problem Relation Age of Onset  . CVA Father   . Alcohol abuse Father   . Heart disease Mother     myocardial infarction  . Diabetes Mother   . Hypertension Brother     x2  . Alcohol abuse Brother   . Arthritis/Rheumatoid Sister     x2  . Cancer Sister 20    breast   . Osteoarthritis Sister   . Asthma Sister   . Hypothyroidism Sister   . Cancer Other     breast - paternal first cousin    Social History History  Substance Use Topics  . Smoking status: Never Smoker   . Smokeless tobacco: Never Used  . Alcohol  Use: No    Allergies  Allergen Reactions  . Crestor [Rosuvastatin] Other (See Comments)    Leg cramping   . Demerol [Meperidine] Nausea And Vomiting  . Latex Other (See Comments)    Redness, hands breakout  . Lipitor [Atorvastatin] Other (See Comments)    Intolerant   . Lovastatin Other (See Comments)    Elevated CK  . Pravastatin Other (See Comments)    intolerant  . Tramadol Nausea Only  . Vicodin [Hydrocodone-Acetaminophen] Other (See Comments)    itching  . Zocor [Simvastatin] Other (See Comments)    Intolerant     Current Outpatient Prescriptions  Medication Sig Dispense Refill  . aspirin 325 MG EC tablet Take 325 mg by mouth daily.      . benazepril (LOTENSIN) 40 MG tablet Take 1 tablet (40 mg total) by mouth daily.  30 tablet  5  . calcium-vitamin D (CALCIUM 500+D) 500-400 MG-UNIT per tablet Take 1 tablet by mouth daily.       . cefadroxil (DURICEF) 500 MG capsule Take 1 capsule (500 mg total) by mouth 2 (two) times daily. Start 1 hour prior to procedure  20 capsule  0  . Cholecalciferol (VITAMIN D PO) Take by mouth.      . fish oil-omega-3 fatty acids 1000 MG capsule 5 (five) times daily.       . fluticasone (FLONASE) 50 MCG/ACT nasal spray Place 2 sprays into the nose daily.      . hydrochlorothiazide (HYDRODIURIL) 25 MG tablet Take 1 tablet (25 mg total) by mouth daily.  30 tablet  5  . lidocaine (LIDODERM) 5 % Place 1 patch onto the skin daily. Remove & Discard patch within 12 hours or as directed by MD  30 patch  0  . Multiple Vitamin (MULTIVITAMIN) tablet Take 1 tablet by mouth daily.      . Multiple Vitamins-Minerals (EYE VITAMINS PO) Take by mouth.      . niacin (NIASPAN) 1000 MG CR tablet Take 1,000 mg by mouth 2 (two) times daily.      . pantoprazole (PROTONIX) 40 MG tablet Take 1 tablet (40 mg total) by mouth 2 (two) times daily.  60 tablet  5  . potassium chloride (K-DUR) 10 MEQ tablet Take 1 tablet (10 mEq total) by mouth 2 (two) times daily.  60 tablet  2   . tamoxifen (NOLVADEX) 20 MG tablet Take 1 tablet (20 mg total) by mouth daily.  30 tablet  11  . amLODipine (NORVASC) 5 MG tablet Take 1 tablet (5 mg total) by mouth daily.  90 tablet  1   No current facility-administered medications for this visit.    Review of Systems Review of Systems  Constitutional: Negative.   Respiratory: Negative.   Cardiovascular: Negative.     Blood pressure 122/78, pulse 77, resp. rate 14, height 5\' 4"  (1.626 m), weight 187 lb (84.823 kg), last menstrual period 07/21/1988.  Physical Exam Physical Exam  Constitutional: She is oriented to person, place, and time. She appears well-developed and well-nourished.  Neck: Neck supple.  Cardiovascular: Normal rate, regular rhythm and normal heart sounds.   Pulmonary/Chest: Effort normal and breath sounds normal. Right breast exhibits no inverted nipple, no mass, no nipple discharge, no skin change and no tenderness. Left breast exhibits no inverted nipple, no mass, no nipple discharge, no skin change and no tenderness.  Well healed incision at 3 o'clock and axilla left breast  Lymphadenopathy:    She has no cervical adenopathy.  Neurological: She is alert and oriented to person, place, and time.    Data Reviewed Bilateral mammograms in July 18, 2013 sure marked little scarring status post wide excision brush radiation. No new areas of concern. BI-RAD-1.  Assessment    Doing well status post management of DCIS with wide excision and partial breast radiation. Good tolerance of tamoxifen therapy.     Plan    The patient will return in one year with repeat mammograms. She continue on tamoxifen as previously instructed.        Robert Bellow 08/14/2013, 8:53 PM

## 2013-08-23 ENCOUNTER — Other Ambulatory Visit: Payer: Medicare Other

## 2013-08-28 ENCOUNTER — Encounter: Payer: Medicare Other | Admitting: Internal Medicine

## 2013-09-10 ENCOUNTER — Other Ambulatory Visit (INDEPENDENT_AMBULATORY_CARE_PROVIDER_SITE_OTHER): Payer: 59

## 2013-09-10 DIAGNOSIS — R7309 Other abnormal glucose: Secondary | ICD-10-CM

## 2013-09-10 DIAGNOSIS — E7800 Pure hypercholesterolemia, unspecified: Secondary | ICD-10-CM

## 2013-09-10 DIAGNOSIS — R7989 Other specified abnormal findings of blood chemistry: Secondary | ICD-10-CM

## 2013-09-10 DIAGNOSIS — R739 Hyperglycemia, unspecified: Secondary | ICD-10-CM

## 2013-09-10 DIAGNOSIS — R945 Abnormal results of liver function studies: Secondary | ICD-10-CM

## 2013-09-10 DIAGNOSIS — I10 Essential (primary) hypertension: Secondary | ICD-10-CM

## 2013-09-10 LAB — LIPID PANEL
Cholesterol: 198 mg/dL (ref 0–200)
HDL: 77.2 mg/dL (ref >39.00)
LDL Cholesterol: 103 mg/dL — ABNORMAL HIGH (ref 0–99)
Total CHOL/HDL Ratio: 3
Triglycerides: 90 mg/dL (ref 0.0–149.0)
VLDL: 18 mg/dL (ref 0.0–40.0)

## 2013-09-10 LAB — HEPATIC FUNCTION PANEL
ALK PHOS: 44 U/L (ref 39–117)
ALT: 26 U/L (ref 0–35)
AST: 30 U/L (ref 0–37)
Albumin: 3.7 g/dL (ref 3.5–5.2)
BILIRUBIN DIRECT: 0 mg/dL (ref 0.0–0.3)
Total Bilirubin: 0.3 mg/dL (ref 0.3–1.2)
Total Protein: 6.3 g/dL (ref 6.0–8.3)

## 2013-09-10 LAB — HEMOGLOBIN A1C: Hgb A1c MFr Bld: 6.1 % (ref 4.6–6.5)

## 2013-09-10 LAB — BASIC METABOLIC PANEL
BUN: 11 mg/dL (ref 6–23)
CALCIUM: 8.7 mg/dL (ref 8.4–10.5)
CO2: 24 meq/L (ref 19–32)
CREATININE: 0.7 mg/dL (ref 0.4–1.2)
Chloride: 105 mEq/L (ref 96–112)
GFR: 94.62 mL/min (ref >60.00)
Glucose, Bld: 97 mg/dL (ref 70–99)
Potassium: 3.8 mEq/L (ref 3.5–5.1)
Sodium: 138 mEq/L (ref 135–145)

## 2013-09-11 ENCOUNTER — Encounter: Payer: Self-pay | Admitting: Internal Medicine

## 2013-09-13 ENCOUNTER — Encounter: Payer: Self-pay | Admitting: Internal Medicine

## 2013-09-13 ENCOUNTER — Ambulatory Visit (INDEPENDENT_AMBULATORY_CARE_PROVIDER_SITE_OTHER): Payer: 59 | Admitting: Internal Medicine

## 2013-09-13 VITALS — BP 120/80 | HR 81 | Temp 98.3°F | Ht 63.25 in | Wt 186.5 lb

## 2013-09-13 DIAGNOSIS — Z1211 Encounter for screening for malignant neoplasm of colon: Secondary | ICD-10-CM

## 2013-09-13 DIAGNOSIS — D0512 Intraductal carcinoma in situ of left breast: Secondary | ICD-10-CM

## 2013-09-13 DIAGNOSIS — E7800 Pure hypercholesterolemia, unspecified: Secondary | ICD-10-CM

## 2013-09-13 DIAGNOSIS — D0590 Unspecified type of carcinoma in situ of unspecified breast: Secondary | ICD-10-CM

## 2013-09-13 DIAGNOSIS — Z23 Encounter for immunization: Secondary | ICD-10-CM

## 2013-09-13 DIAGNOSIS — K5730 Diverticulosis of large intestine without perforation or abscess without bleeding: Secondary | ICD-10-CM

## 2013-09-13 DIAGNOSIS — I10 Essential (primary) hypertension: Secondary | ICD-10-CM

## 2013-09-13 DIAGNOSIS — K5790 Diverticulosis of intestine, part unspecified, without perforation or abscess without bleeding: Secondary | ICD-10-CM

## 2013-09-13 DIAGNOSIS — R739 Hyperglycemia, unspecified: Secondary | ICD-10-CM

## 2013-09-13 DIAGNOSIS — R0989 Other specified symptoms and signs involving the circulatory and respiratory systems: Secondary | ICD-10-CM

## 2013-09-13 DIAGNOSIS — R945 Abnormal results of liver function studies: Secondary | ICD-10-CM

## 2013-09-13 DIAGNOSIS — K219 Gastro-esophageal reflux disease without esophagitis: Secondary | ICD-10-CM

## 2013-09-13 DIAGNOSIS — R7989 Other specified abnormal findings of blood chemistry: Secondary | ICD-10-CM

## 2013-09-13 DIAGNOSIS — M519 Unspecified thoracic, thoracolumbar and lumbosacral intervertebral disc disorder: Secondary | ICD-10-CM

## 2013-09-13 DIAGNOSIS — R7309 Other abnormal glucose: Secondary | ICD-10-CM

## 2013-09-13 DIAGNOSIS — Z9109 Other allergy status, other than to drugs and biological substances: Secondary | ICD-10-CM

## 2013-09-13 MED ORDER — FLUTICASONE PROPIONATE 50 MCG/ACT NA SUSP: 2.0000 | Freq: Every day | NASAL | Status: DC

## 2013-09-13 MED ORDER — LIDOCAINE 5 % EX PTCH: 1.0000 | MEDICATED_PATCH | CUTANEOUS | Status: DC

## 2013-09-13 NOTE — Progress Notes (Signed)
Pre-visit discussion using our clinic review tool. No additional management support is needed unless otherwise documented below in the visit note.  

## 2013-09-14 ENCOUNTER — Other Ambulatory Visit: Payer: Self-pay | Admitting: *Deleted

## 2013-09-15 MED ORDER — POTASSIUM CHLORIDE ER 10 MEQ PO TBCR: 10.0000 meq | EXTENDED_RELEASE_TABLET | Freq: Two times a day (BID) | ORAL | Status: DC

## 2013-09-16 ENCOUNTER — Encounter: Payer: Self-pay | Admitting: Internal Medicine

## 2013-09-16 NOTE — Assessment & Plan Note (Signed)
Seeing Dr McQueen.  Receiving allergy shots now.  Follow.   

## 2013-09-16 NOTE — Assessment & Plan Note (Signed)
Blood pressure is under good control.  Same meds.  Follow metabolic panel.   

## 2013-09-16 NOTE — Assessment & Plan Note (Signed)
Evaluated by orthopedics.  She declined epidural.  Has had physical therapy.  Follow.  Stable.  Lidoderm patches prn help.

## 2013-09-16 NOTE — Assessment & Plan Note (Signed)
Symptoms controlled

## 2013-09-16 NOTE — Assessment & Plan Note (Signed)
Unable to tolerate statins.  On niacin.  Low cholesterol diet and exercise.  Follow.   

## 2013-09-16 NOTE — Assessment & Plan Note (Signed)
Low carb diet and exercise.  Follow met b and a1c.    

## 2013-09-16 NOTE — Progress Notes (Signed)
Subjective:    Patient ID: Helen Marshall, female    DOB: 1945/12/21, 68 y.o.   MRN: [id number]  HPI 68 year old female with past history of hypertension, hypercholesterolemia and GERD who comes in today to follow up on these issues as well as for a complete physical exam.   Saw Dr Tami Ribas.  On allergy shots now.   Has some wheat allergy. No nausea or vomiting.  No blood in the stool.  No urinary change.  Feels her bowels are relatively stable.  Does occasionally have some increased gas.  Will notice with certain foods.  If she watches what she eats, does not have bowel issues.  Recenlyt diagnosed with breast cancer.  See Dr Dwyane Luo note for details.  On tamoxifen now.  Had follow up left breast mammogram 12/14.  Everything checked out fine.  Overall she feels she is doing well.  Handling stress well. Seeing dermatology - Dr Evorn Gong.  Had squamous cell removed right upper arm.  Has had light laser treatments on her face in December.  She uses Lidoderm patches intermittently for the pain from her ruptured disc.  Just uses occasionally. Unable to take tramadol and antiinflammatories.     Past Medical History  Diagnosis Date  . Hypertension   . Hypercholesterolemia   . IBS (irritable bowel syndrome)   . GERD (gastroesophageal reflux disease)   . Carotid bruit     left  . Diverticulosis   . Hyperglycemia   . Lumbar disc disease   . Environmental allergies   . Heart murmur   . Diverticulosis 2012  . Abnormal mammogram 12/19/2012    left  . Cancer   . 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 Wide local excision, sentinel node biopsy, MammoSite partial breast radiation..    Current Outpatient Prescriptions on File Prior to Visit  Medication Sig Dispense Refill  . amLODipine (NORVASC) 5 MG tablet Take 1 tablet (5 mg total) by mouth daily.  90 tablet  1  . aspirin 325 MG EC tablet Take 325 mg by mouth daily.      . benazepril  (LOTENSIN) 40 MG tablet Take 1 tablet (40 mg total) by mouth daily.  30 tablet  5  . calcium-vitamin D (CALCIUM 500+D) 500-400 MG-UNIT per tablet Take 1 tablet by mouth daily.       . Cholecalciferol (VITAMIN D PO) Take by mouth.      . fish oil-omega-3 fatty acids 1000 MG capsule 5 (five) times daily.       . hydrochlorothiazide (HYDRODIURIL) 25 MG tablet Take 1 tablet (25 mg total) by mouth daily.  30 tablet  5  . Multiple Vitamin (MULTIVITAMIN) tablet Take 1 tablet by mouth daily.      . Multiple Vitamins-Minerals (EYE VITAMINS PO) Take by mouth.      . niacin (NIASPAN) 1000 MG CR tablet Take 1,000 mg by mouth 2 (two) times daily.      . pantoprazole (PROTONIX) 40 MG tablet Take 1 tablet (40 mg total) by mouth 2 (two) times daily.  60 tablet  5  . tamoxifen (NOLVADEX) 20 MG tablet Take 1 tablet (20 mg total) by mouth daily.  30 tablet  11   No current facility-administered medications on file prior to visit.    Review of Systems Patient denies any headache, lightheadedness or dizziness. No significant sinus or allergy symptoms currently.  No chest pain, tightness or palpitations.  No increased shortness of breath, cough or congestion.  No nausea or vomiting.  No abdominal pain or cramping.  Acid reflux controlled.  Some occasional increased gas as outlined.  No urine change.  Handling stress relatively well.  Recent diagnosis of breast cancer.  On tamoxifen.  Continues follow up with Dr Bary Castilla.      Objective:   Physical Exam  Filed Vitals:   09/13/13 1138  BP: 120/80  Pulse: 81  Temp: 98.3 F (36.8 C)   Blood pressure recheck:  41/105  68 year old female in no acute distress.   HEENT:  Nares- clear.  Oropharynx - without lesions. NECK:  Supple.  Nontender.  No audible bruit.  HEART:  Appears to be regular. LUNGS:  No crackles or wheezing audible.  Respirations even and unlabored.  RADIAL PULSE:  Equal bilaterally.    BREASTS:  No nipple discharge or nipple retraction present.   Could not appreciate any distinct nodules or axillary adenopathy.  ABDOMEN:  Soft, nontender.  Bowel sounds present and normal.  No audible abdominal bruit.  GU:  Not performed.     EXTREMITIES:  No increased edema present.  DP pulses palpable and equal bilaterally.          Assessment & Plan:  HISTORY OF LEFT CAROTID BRUIT.  Carotid ultrasound 9/12 revealed no significant stenosis.  Asymptomatic.  Continue daily aspirin.   CARDIOVASCULAR.  Stable.  Continue risk factor modification.  Has a known murmur.    INCREASED PSYCHOSOCIAL STRESSORS.  On Zoloft.  Stable.  Follow.   HEALTH MAINTENANCE.  Physical today.  Is s/p hysterectomy.  Does not require yearly pap smears.  Colonoscopy 06/02/11 revealed diverticulosis and a lipoma.  Breast cancer recently diagnosed as outlined.  Mammograms ordered through Dr Bary Castilla.  Last mammogram 07/18/13 - [facility].  Bone density 05/26/10 normal.  IFOB.

## 2013-09-16 NOTE — Assessment & Plan Note (Signed)
Bowels relatively stable.  Colonoscopy 06/02/11 revealed diverticulosis and a lipoma.  IFOB.

## 2013-09-16 NOTE — Assessment & Plan Note (Signed)
Last liver panel wnl.  Follow liver panel.   

## 2013-09-16 NOTE — Assessment & Plan Note (Signed)
On Tamoxifen and doing well.  Mammogram 07/18/13 - [facility].  Continue to follow up with Dr Byrnett.    

## 2013-09-16 NOTE — Assessment & Plan Note (Signed)
Carotid ultrasound 9/12 revealed no significant stenosis.  Asymptomatic. Continue daily aspirin.   

## 2013-09-17 ENCOUNTER — Other Ambulatory Visit (INDEPENDENT_AMBULATORY_CARE_PROVIDER_SITE_OTHER): Payer: 59

## 2013-09-17 DIAGNOSIS — Z1211 Encounter for screening for malignant neoplasm of colon: Secondary | ICD-10-CM

## 2013-09-17 LAB — FECAL OCCULT BLOOD, IMMUNOCHEMICAL: Fecal Occult Bld: NEGATIVE

## 2013-09-18 ENCOUNTER — Encounter: Payer: Self-pay | Admitting: Internal Medicine

## 2013-10-08 ENCOUNTER — Other Ambulatory Visit: Payer: Self-pay | Admitting: *Deleted

## 2013-10-08 MED ORDER — PANTOPRAZOLE SODIUM 40 MG PO TBEC: 40.0000 mg | DELAYED_RELEASE_TABLET | Freq: Two times a day (BID) | ORAL | Status: DC

## 2013-11-13 ENCOUNTER — Other Ambulatory Visit: Payer: Self-pay | Admitting: *Deleted

## 2013-11-13 MED ORDER — HYDROCHLOROTHIAZIDE 25 MG PO TABS: 25.0000 mg | ORAL_TABLET | Freq: Every day | ORAL | Status: DC

## 2013-11-13 MED ORDER — BENAZEPRIL HCL 40 MG PO TABS: 40.0000 mg | ORAL_TABLET | Freq: Every day | ORAL | Status: DC

## 2013-11-13 MED ORDER — AMLODIPINE BESYLATE 5 MG PO TABS: 5.0000 mg | ORAL_TABLET | Freq: Every day | ORAL | Status: DC

## 2014-01-16 ENCOUNTER — Other Ambulatory Visit (INDEPENDENT_AMBULATORY_CARE_PROVIDER_SITE_OTHER): Payer: 59

## 2014-01-16 DIAGNOSIS — K5730 Diverticulosis of large intestine without perforation or abscess without bleeding: Secondary | ICD-10-CM

## 2014-01-16 DIAGNOSIS — I10 Essential (primary) hypertension: Secondary | ICD-10-CM

## 2014-01-16 DIAGNOSIS — R7989 Other specified abnormal findings of blood chemistry: Secondary | ICD-10-CM

## 2014-01-16 DIAGNOSIS — R945 Abnormal results of liver function studies: Secondary | ICD-10-CM

## 2014-01-16 DIAGNOSIS — E7800 Pure hypercholesterolemia, unspecified: Secondary | ICD-10-CM

## 2014-01-16 DIAGNOSIS — K5790 Diverticulosis of intestine, part unspecified, without perforation or abscess without bleeding: Secondary | ICD-10-CM

## 2014-01-16 DIAGNOSIS — R7309 Other abnormal glucose: Secondary | ICD-10-CM

## 2014-01-16 DIAGNOSIS — R739 Hyperglycemia, unspecified: Secondary | ICD-10-CM

## 2014-01-16 LAB — HEPATIC FUNCTION PANEL
ALBUMIN: 3.8 g/dL (ref 3.5–5.2)
ALT: 26 U/L (ref 0–35)
AST: 33 U/L (ref 0–37)
Alkaline Phosphatase: 38 U/L — ABNORMAL LOW (ref 39–117)
BILIRUBIN TOTAL: 0.4 mg/dL (ref 0.2–1.2)
Bilirubin, Direct: 0.1 mg/dL (ref 0.0–0.3)
Total Protein: 6.3 g/dL (ref 6.0–8.3)

## 2014-01-16 LAB — LIPID PANEL
CHOL/HDL RATIO: 3
Cholesterol: 213 mg/dL — ABNORMAL HIGH (ref 0–200)
HDL: 84.8 mg/dL (ref >39.00)
LDL Cholesterol: 110 mg/dL — ABNORMAL HIGH (ref 0–99)
NONHDL: 128.2
Triglycerides: 92 mg/dL (ref 0.0–149.0)
VLDL: 18.4 mg/dL (ref 0.0–40.0)

## 2014-01-16 LAB — BASIC METABOLIC PANEL
BUN: 8 mg/dL (ref 6–23)
CALCIUM: 9.4 mg/dL (ref 8.4–10.5)
CO2: 29 meq/L (ref 19–32)
CREATININE: 0.7 mg/dL (ref 0.4–1.2)
Chloride: 104 mEq/L (ref 96–112)
GFR: 94.53 mL/min (ref >60.00)
Glucose, Bld: 103 mg/dL — ABNORMAL HIGH (ref 70–99)
Potassium: 3.5 mEq/L (ref 3.5–5.1)
Sodium: 140 mEq/L (ref 135–145)

## 2014-01-16 LAB — CBC WITH DIFFERENTIAL/PLATELET
BASOS ABS: 0 10*3/uL (ref 0.0–0.1)
BASOS PCT: 0.5 % (ref 0.0–3.0)
EOS ABS: 0.1 10*3/uL (ref 0.0–0.7)
Eosinophils Relative: 2.4 % (ref 0.0–5.0)
HCT: 38.6 % (ref 36.0–46.0)
HEMOGLOBIN: 13.1 g/dL (ref 12.0–15.0)
LYMPHS PCT: 36 % (ref 12.0–46.0)
Lymphs Abs: 1.6 10*3/uL (ref 0.7–4.0)
MCHC: 33.9 g/dL (ref 30.0–36.0)
MCV: 92.5 fl (ref 78.0–100.0)
MONO ABS: 0.5 10*3/uL (ref 0.1–1.0)
Monocytes Relative: 11.4 % (ref 3.0–12.0)
NEUTROS ABS: 2.2 10*3/uL (ref 1.4–7.7)
Neutrophils Relative %: 49.7 % (ref 43.0–77.0)
Platelets: 198 10*3/uL (ref 150.0–400.0)
RBC: 4.17 Mil/uL (ref 3.87–5.11)
RDW: 14.6 % (ref 11.5–15.5)
WBC: 4.4 10*3/uL (ref 4.0–10.5)

## 2014-01-16 LAB — MICROALBUMIN / CREATININE URINE RATIO
CREATININE, U: 155 mg/dL
Microalb Creat Ratio: 0.3 mg/g (ref 0.0–30.0)
Microalb, Ur: 0.5 mg/dL (ref 0.0–1.9)

## 2014-01-16 LAB — HEMOGLOBIN A1C: Hgb A1c MFr Bld: 6 % (ref 4.6–6.5)

## 2014-01-16 LAB — TSH: TSH: 1.22 u[IU]/mL (ref 0.35–4.50)

## 2014-01-17 ENCOUNTER — Encounter: Payer: Self-pay | Admitting: Internal Medicine

## 2014-01-21 ENCOUNTER — Ambulatory Visit (INDEPENDENT_AMBULATORY_CARE_PROVIDER_SITE_OTHER): Payer: 59 | Admitting: Internal Medicine

## 2014-01-21 ENCOUNTER — Encounter: Payer: Self-pay | Admitting: Internal Medicine

## 2014-01-21 VITALS — BP 130/70 | HR 76 | Temp 98.5°F | Ht 63.25 in | Wt 188.5 lb

## 2014-01-21 DIAGNOSIS — E7800 Pure hypercholesterolemia, unspecified: Secondary | ICD-10-CM

## 2014-01-21 DIAGNOSIS — M519 Unspecified thoracic, thoracolumbar and lumbosacral intervertebral disc disorder: Secondary | ICD-10-CM

## 2014-01-21 DIAGNOSIS — R945 Abnormal results of liver function studies: Secondary | ICD-10-CM

## 2014-01-21 DIAGNOSIS — R739 Hyperglycemia, unspecified: Secondary | ICD-10-CM

## 2014-01-21 DIAGNOSIS — R0989 Other specified symptoms and signs involving the circulatory and respiratory systems: Secondary | ICD-10-CM

## 2014-01-21 DIAGNOSIS — R7309 Other abnormal glucose: Secondary | ICD-10-CM

## 2014-01-21 DIAGNOSIS — I10 Essential (primary) hypertension: Secondary | ICD-10-CM

## 2014-01-21 DIAGNOSIS — R7989 Other specified abnormal findings of blood chemistry: Secondary | ICD-10-CM

## 2014-01-21 DIAGNOSIS — K219 Gastro-esophageal reflux disease without esophagitis: Secondary | ICD-10-CM

## 2014-01-21 DIAGNOSIS — C50419 Malignant neoplasm of upper-outer quadrant of unspecified female breast: Secondary | ICD-10-CM

## 2014-01-21 DIAGNOSIS — K5730 Diverticulosis of large intestine without perforation or abscess without bleeding: Secondary | ICD-10-CM

## 2014-01-21 DIAGNOSIS — Z9109 Other allergy status, other than to drugs and biological substances: Secondary | ICD-10-CM

## 2014-01-21 DIAGNOSIS — K429 Umbilical hernia without obstruction or gangrene: Secondary | ICD-10-CM

## 2014-01-21 NOTE — Progress Notes (Signed)
Pre visit review using our clinic review tool, if applicable. No additional management support is needed unless otherwise documented below in the visit note. 

## 2014-01-27 ENCOUNTER — Encounter: Payer: Self-pay | Admitting: Internal Medicine

## 2014-01-27 ENCOUNTER — Telehealth: Payer: Self-pay | Admitting: Internal Medicine

## 2014-01-27 NOTE — Assessment & Plan Note (Signed)
Bowels relatively stable.  Colonoscopy 06/02/11 revealed diverticulosis and a lipoma.

## 2014-01-27 NOTE — Assessment & Plan Note (Signed)
Low carb diet and exercise.  Follow met b and a1c.    

## 2014-01-27 NOTE — Assessment & Plan Note (Signed)
Unable to tolerate statins.  On niacin.  Low cholesterol diet and exercise.  Follow.

## 2014-01-27 NOTE — Assessment & Plan Note (Signed)
Evaluated by orthopedics.  She declined epidural.  Has had physical therapy.  Follow.  Stable.  Lidoderm patches prn help.   She is unable to tolerate antiinflammatories and ultram.

## 2014-01-27 NOTE — Assessment & Plan Note (Signed)
Blood pressure is under good control.  Same meds.  Follow metabolic panel.

## 2014-01-27 NOTE — Assessment & Plan Note (Signed)
Symptoms controlled

## 2014-01-27 NOTE — Assessment & Plan Note (Signed)
Carotid ultrasound 9/12 revealed no significant stenosis.  Asymptomatic. Continue daily aspirin.

## 2014-01-27 NOTE — Progress Notes (Signed)
Subjective:    Patient ID: Helen Marshall, female    DOB: [birth year], 68 y.o.   MRN: [id number]  HPI 68 year old female with past history of hypertension, hypercholesterolemia and GERD who comes in today for a scheduled follow up.  Saw Dr Tami Ribas.  On allergy shots now.   Has some wheat allergy. No nausea or vomiting.  No blood in the stool.  No urinary change.  Feels her bowels are relatively stable.   If she watches what she eats, does not have bowel issues.  Recently diagnosed with breast cancer.  See Dr Dwyane Luo note for details.  On tamoxifen.  Had follow up left breast mammogram 12/14.  Everything checked out fine.  Overall she feels she is doing well.  Handling stress well. Seeing dermatology - Dr Evorn Gong.  Had squamous cell removed right upper arm.  Has had light laser treatments on her face in December.  She uses Lidoderm patches intermittently for the pain from her ruptured disc.  Just uses occasionally. Unable to take tramadol and antiinflammatories.  Has "hernia" lower abdomen.  No significant pain or problems with "hernia".     Past Medical History  Diagnosis Date  . Hypertension   . Hypercholesterolemia   . IBS (irritable bowel syndrome)   . GERD (gastroesophageal reflux disease)   . Carotid bruit     left  . Diverticulosis   . Hyperglycemia   . Lumbar disc disease   . Environmental allergies   . Heart murmur   . Diverticulosis 2012  . Abnormal mammogram 12/19/2012    left  . Cancer   . 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 Wide local excision, sentinel node biopsy, MammoSite partial breast radiation..    Current Outpatient Prescriptions on File Prior to Visit  Medication Sig Dispense Refill  . amLODipine (NORVASC) 5 MG tablet Take 1 tablet (5 mg total) by mouth daily.  90 tablet  1  . aspirin 325 MG EC tablet Take 325 mg by mouth daily.      . benazepril (LOTENSIN) 40 MG tablet Take 1 tablet (40 mg total) by  mouth daily.  30 tablet  5  . calcium-vitamin D (CALCIUM 500+D) 500-400 MG-UNIT per tablet Take 1 tablet by mouth daily.       . Cholecalciferol (VITAMIN D PO) Take by mouth.      . fish oil-omega-3 fatty acids 1000 MG capsule 5 (five) times daily.       . fluticasone (FLONASE) 50 MCG/ACT nasal spray Place 2 sprays into both nostrils daily.  16 g  5  . hydrochlorothiazide (HYDRODIURIL) 25 MG tablet Take 1 tablet (25 mg total) by mouth daily.  30 tablet  5  . lidocaine (LIDODERM) 5 % Place 1 patch onto the skin daily. Remove & Discard patch within 12 hours or as directed by MD  30 patch  0  . Multiple Vitamin (MULTIVITAMIN) tablet Take 1 tablet by mouth daily.      . Multiple Vitamins-Minerals (EYE VITAMINS PO) Take by mouth.      . niacin (NIASPAN) 1000 MG CR tablet Take 1,000 mg by mouth 2 (two) times daily.      . pantoprazole (PROTONIX) 40 MG tablet Take 1 tablet (40 mg total) by mouth 2 (two) times daily.  60 tablet  5  . potassium chloride (K-DUR) 10 MEQ tablet Take 1 tablet (10 mEq total) by mouth 2 (two) times  daily.  60 tablet  2  . tamoxifen (NOLVADEX) 20 MG tablet Take 1 tablet (20 mg total) by mouth daily.  30 tablet  11   No current facility-administered medications on file prior to visit.    Review of Systems Patient denies any headache, lightheadedness or dizziness. No significant sinus or allergy symptoms currently.  No chest pain, tightness or palpitations.  No increased shortness of breath, cough or congestion.  No nausea or vomiting.  No abdominal pain or cramping.  Hernia.  No pain.  Acid reflux controlled.   No urine change.  Handling stress relatively well.  Recent diagnosis of breast cancer.  On tamoxifen.  Continues follow up with Dr Bary Castilla.      Objective:   Physical Exam  Filed Vitals:   01/21/14 0959  BP: 130/70  Pulse: 76  Temp: 98.5 F (36.9 C)   Blood pressure recheck:  73/54  68 year old female in no acute distress.   HEENT:  Nares- clear.  Oropharynx  - without lesions. NECK:  Supple.  Nontender.  No audible bruit.  HEART:  Appears to be regular.  Questionable I/VI systolic murmur.   LUNGS:  No crackles or wheezing audible.  Respirations even and unlabored.  RADIAL PULSE:  Equal bilaterally.  ABDOMEN:  Soft, nontender.  Bowel sounds present and normal.  No audible abdominal bruit.  No pain to palpation over the hernia.     EXTREMITIES:  No increased edema present.  DP pulses palpable and equal bilaterally.          Assessment & Plan:  HISTORY OF LEFT CAROTID BRUIT.  Carotid ultrasound 9/12 revealed no significant stenosis.  Asymptomatic.  Continue daily aspirin.   CARDIOVASCULAR.  Stable.  Continue risk factor modification.  Has a known murmur.  Stable.    INCREASED PSYCHOSOCIAL STRESSORS.  On Zoloft.  Stable.  Follow.   HEALTH MAINTENANCE.  Physical 09/13/13.  Is s/p hysterectomy.  Does not require yearly pap smears.  Colonoscopy 06/02/11 revealed diverticulosis and a lipoma.  Breast cancer recently diagnosed as outlined.  Mammograms ordered through Dr Bary Castilla.  Last mammogram 07/18/13 - Birads I.  Recommend f/u in one year.  Bone density 05/26/10 normal.

## 2014-01-27 NOTE — Assessment & Plan Note (Signed)
Last liver panel wnl.  Follow liver panel.

## 2014-01-27 NOTE — Assessment & Plan Note (Signed)
Seeing Dr Tami Ribas.  Receiving allergy shots now.  Follow.

## 2014-01-27 NOTE — Telephone Encounter (Signed)
Pt states that she and company have contacted [place] about Lidoderm patches.  No document in chart anyone has contacted.  Pt needs lidoderm patches.  States needs prior authorization.  She is unable to take antiinflammatories and did not tolerate ultram.  Let me know what I need to do.  Thanks.

## 2014-01-27 NOTE — Assessment & Plan Note (Signed)
On Tamoxifen and doing well.  Mammogram 07/18/13 - Birads I.  Continue to follow up with Dr Bary Castilla.

## 2014-01-27 NOTE — Assessment & Plan Note (Signed)
No pain to palpation.  Does not bother her.  Follow.

## 2014-01-29 NOTE — Telephone Encounter (Signed)
Placed PA request form in Dr.scott box

## 2014-01-30 ENCOUNTER — Other Ambulatory Visit: Payer: Self-pay | Admitting: *Deleted

## 2014-01-30 MED ORDER — LIDOCAINE 5 % EX PTCH: 1.0000 | MEDICATED_PATCH | CUTANEOUS | Status: DC

## 2014-02-05 ENCOUNTER — Other Ambulatory Visit: Payer: Self-pay | Admitting: *Deleted

## 2014-02-05 MED ORDER — POTASSIUM CHLORIDE ER 10 MEQ PO TBCR: 10.0000 meq | EXTENDED_RELEASE_TABLET | Freq: Two times a day (BID) | ORAL | Status: DC

## 2014-02-05 NOTE — Telephone Encounter (Signed)
Noted  

## 2014-02-05 NOTE — Telephone Encounter (Signed)
Received a denial from [facility] for Lidocaine patches. Pt notified via mychart

## 2014-02-08 NOTE — Telephone Encounter (Signed)
Unread mychart message mailed to patient 

## 2014-04-25 ENCOUNTER — Encounter: Payer: Self-pay | Admitting: General Surgery

## 2014-04-25 NOTE — Telephone Encounter (Signed)
We apologize for any inconvenience this may have caused. We have faxed the refill request to [facility] for your Tamoxifen.

## 2014-04-30 ENCOUNTER — Telehealth: Payer: Self-pay | Admitting: *Deleted

## 2014-04-30 NOTE — Telephone Encounter (Signed)
Fax from pharmacy, needing PA on Lidoderm patches. PA started, given to Dr. Nicki Reaper for completion

## 2014-05-01 NOTE — Telephone Encounter (Signed)
Fax from [employer], PA denied. "Coverage is provided in situations where this drug is being prescribed for the treatment of pain after shingles or diabetic neuropathic pain." Denial placed in Dr. Bary Leriche box.

## 2014-05-14 ENCOUNTER — Other Ambulatory Visit: Payer: Self-pay | Admitting: *Deleted

## 2014-05-14 MED ORDER — BENAZEPRIL HCL 40 MG PO TABS: 40.0000 mg | ORAL_TABLET | Freq: Every day | ORAL | Status: DC

## 2014-05-14 MED ORDER — AMLODIPINE BESYLATE 5 MG PO TABS: 5.0000 mg | ORAL_TABLET | Freq: Every day | ORAL | Status: DC

## 2014-05-15 ENCOUNTER — Other Ambulatory Visit (INDEPENDENT_AMBULATORY_CARE_PROVIDER_SITE_OTHER): Payer: 59

## 2014-05-15 DIAGNOSIS — E7800 Pure hypercholesterolemia, unspecified: Secondary | ICD-10-CM

## 2014-05-15 DIAGNOSIS — R945 Abnormal results of liver function studies: Secondary | ICD-10-CM

## 2014-05-15 DIAGNOSIS — R739 Hyperglycemia, unspecified: Secondary | ICD-10-CM

## 2014-05-15 DIAGNOSIS — R7989 Other specified abnormal findings of blood chemistry: Secondary | ICD-10-CM

## 2014-05-15 DIAGNOSIS — I10 Essential (primary) hypertension: Secondary | ICD-10-CM

## 2014-05-15 LAB — HEPATIC FUNCTION PANEL
ALBUMIN: 3.3 g/dL — AB (ref 3.5–5.2)
ALK PHOS: 44 U/L (ref 39–117)
ALT: 31 U/L (ref 0–35)
AST: 32 U/L (ref 0–37)
Bilirubin, Direct: 0 mg/dL (ref 0.0–0.3)
TOTAL PROTEIN: 6.9 g/dL (ref 6.0–8.3)
Total Bilirubin: 0.7 mg/dL (ref 0.2–1.2)

## 2014-05-15 LAB — HEMOGLOBIN A1C: HEMOGLOBIN A1C: 6 % (ref 4.6–6.5)

## 2014-05-15 LAB — BASIC METABOLIC PANEL
BUN: 9 mg/dL (ref 6–23)
CALCIUM: 8.9 mg/dL (ref 8.4–10.5)
CO2: 28 mEq/L (ref 19–32)
CREATININE: 0.8 mg/dL (ref 0.4–1.2)
Chloride: 104 mEq/L (ref 96–112)
GFR: 76.74 mL/min (ref >60.00)
Glucose, Bld: 107 mg/dL — ABNORMAL HIGH (ref 70–99)
Potassium: 3.9 mEq/L (ref 3.5–5.1)
SODIUM: 138 meq/L (ref 135–145)

## 2014-05-15 LAB — LIPID PANEL
Cholesterol: 220 mg/dL — ABNORMAL HIGH (ref 0–200)
HDL: 70 mg/dL (ref >39.00)
LDL Cholesterol: 128 mg/dL — ABNORMAL HIGH (ref 0–99)
NonHDL: 150
TRIGLYCERIDES: 111 mg/dL (ref 0.0–149.0)
Total CHOL/HDL Ratio: 3
VLDL: 22.2 mg/dL (ref 0.0–40.0)

## 2014-05-16 ENCOUNTER — Encounter: Payer: Self-pay | Admitting: Internal Medicine

## 2014-05-20 NOTE — Telephone Encounter (Signed)
Unread mychart message mailed to patient 

## 2014-05-21 ENCOUNTER — Other Ambulatory Visit: Payer: 59

## 2014-05-23 ENCOUNTER — Encounter: Payer: Self-pay | Admitting: Internal Medicine

## 2014-05-23 ENCOUNTER — Ambulatory Visit (INDEPENDENT_AMBULATORY_CARE_PROVIDER_SITE_OTHER): Payer: 59 | Admitting: Internal Medicine

## 2014-05-23 VITALS — BP 120/80 | HR 85 | Temp 98.2°F | Ht 63.25 in | Wt 190.0 lb

## 2014-05-23 DIAGNOSIS — K5730 Diverticulosis of large intestine without perforation or abscess without bleeding: Secondary | ICD-10-CM

## 2014-05-23 DIAGNOSIS — E7800 Pure hypercholesterolemia, unspecified: Secondary | ICD-10-CM

## 2014-05-23 DIAGNOSIS — Z79899 Other long term (current) drug therapy: Secondary | ICD-10-CM

## 2014-05-23 DIAGNOSIS — R739 Hyperglycemia, unspecified: Secondary | ICD-10-CM

## 2014-05-23 DIAGNOSIS — Z23 Encounter for immunization: Secondary | ICD-10-CM

## 2014-05-23 DIAGNOSIS — D0512 Intraductal carcinoma in situ of left breast: Secondary | ICD-10-CM

## 2014-05-23 DIAGNOSIS — E2839 Other primary ovarian failure: Secondary | ICD-10-CM

## 2014-05-23 DIAGNOSIS — Z91048 Other nonmedicinal substance allergy status: Secondary | ICD-10-CM

## 2014-05-23 DIAGNOSIS — R945 Abnormal results of liver function studies: Secondary | ICD-10-CM

## 2014-05-23 DIAGNOSIS — Z9109 Other allergy status, other than to drugs and biological substances: Secondary | ICD-10-CM

## 2014-05-23 DIAGNOSIS — I10 Essential (primary) hypertension: Secondary | ICD-10-CM

## 2014-05-23 DIAGNOSIS — K219 Gastro-esophageal reflux disease without esophagitis: Secondary | ICD-10-CM

## 2014-05-23 DIAGNOSIS — R7989 Other specified abnormal findings of blood chemistry: Secondary | ICD-10-CM

## 2014-05-23 MED ORDER — BENAZEPRIL HCL 40 MG PO TABS: 40.0000 mg | ORAL_TABLET | Freq: Every day | ORAL | Status: DC

## 2014-05-23 MED ORDER — HYDROCHLOROTHIAZIDE 25 MG PO TABS: 25.0000 mg | ORAL_TABLET | Freq: Every day | ORAL | Status: DC

## 2014-05-23 NOTE — Progress Notes (Signed)
Pre visit review using our clinic review tool, if applicable. No additional management support is needed unless otherwise documented below in the visit note. 

## 2014-05-23 NOTE — Progress Notes (Signed)
Subjective:    Patient ID: Helen Marshall, female    DOB: 1946-05-09, 68 y.o.   MRN: [id number]  HPI 68 year old female with past history of hypertension, hypercholesterolemia and GERD who comes in today for a scheduled follow up.  Saw Dr Tami Ribas.  On allergy shots now.  Has some wheat allergy. No nausea or vomiting.  No blood in the stool.  No urinary change.  Feels her bowels are relatively stable.  On probiotics.  Stomach better.   If she watches what she eats, does not have bowel issues.  Recently diagnosed with breast cancer.  See Dr Dwyane Luo note for details.  On tamoxifen. Had follow up left breast mammogram 12/14.  Everything checked out fine.  Has f/u 08/2014.  Overall she feels she is doing well.  Handling stress well. Seeing dermatology - Dr Evorn Gong.  Had squamous cell removed right upper arm.  Has had light laser treatments on her face.   She was using Lidoderm patches intermittently for the pain from her ruptured disc.  Just used occasionally. Unable to take tramadol and antiinflammatories.  Insurance denied coverage despite appeal.  Has "hernia" lower abdomen.  No significant pain or problems with "hernia".     Past Medical History  Diagnosis Date  . Hypertension   . Hypercholesterolemia   . IBS (irritable bowel syndrome)   . GERD (gastroesophageal reflux disease)   . Carotid bruit     left  . Diverticulosis   . Hyperglycemia   . Lumbar disc disease   . Environmental allergies   . Heart murmur   . Diverticulosis 2012  . Abnormal mammogram 12/19/2012    left  . Cancer   . 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 Wide local excision, sentinel node biopsy, MammoSite partial breast radiation..    Current Outpatient Prescriptions on File Prior to Visit  Medication Sig Dispense Refill  . amLODipine (NORVASC) 5 MG tablet Take 1 tablet (5 mg total) by mouth daily.  90 tablet  1  . benazepril (LOTENSIN) 40 MG tablet Take 1  tablet (40 mg total) by mouth daily.  90 tablet  1  . calcium-vitamin D (CALCIUM 500+D) 500-400 MG-UNIT per tablet Take 1 tablet by mouth daily.       . Cholecalciferol (VITAMIN D PO) Take by mouth.      . fish oil-omega-3 fatty acids 1000 MG capsule 5 (five) times daily.       . fluticasone (FLONASE) 50 MCG/ACT nasal spray Place 2 sprays into both nostrils daily.  16 g  5  . hydrochlorothiazide (HYDRODIURIL) 25 MG tablet Take 1 tablet (25 mg total) by mouth daily.  30 tablet  5  . Multiple Vitamin (MULTIVITAMIN) tablet Take 1 tablet by mouth daily.      . Multiple Vitamins-Minerals (EYE VITAMINS PO) Take by mouth.      . niacin (NIASPAN) 1000 MG CR tablet Take 1,000 mg by mouth 2 (two) times daily.      . pantoprazole (PROTONIX) 40 MG tablet Take 1 tablet (40 mg total) by mouth 2 (two) times daily.  60 tablet  5  . potassium chloride (K-DUR) 10 MEQ tablet Take 1 tablet (10 mEq total) by mouth 2 (two) times daily.  60 tablet  2  . tamoxifen (NOLVADEX) 20 MG tablet Take 1 tablet (20 mg total) by mouth daily.  30 tablet  11   No current facility-administered medications  on file prior to visit.    Review of Systems Patient denies any headache, lightheadedness or dizziness. No significant sinus or allergy symptoms currently.  No chest pain, tightness or palpitations.  No increased shortness of breath, cough or congestion.  No nausea or vomiting.  No abdominal pain or cramping.  Stomach better on probiotics.  Hernia.  No pain.  Acid reflux controlled.   No urine change.  Handling stress relatively well.  Recent diagnosis of breast cancer.  On tamoxifen.  Continues follow up with Dr Bary Castilla.      Objective:   Physical Exam  Filed Vitals:   05/23/14 0906  BP: 120/80  Pulse: 85  Temp: 98.2 F (36.8 C)   Blood pressure recheck:  30/24  68 year old female in no acute distress.   HEENT:  Nares- clear.  Oropharynx - without lesions. NECK:  Supple.  Nontender.  No audible bruit.  HEART:   Appears to be regular.  Questionable I/VI systolic murmur.   LUNGS:  No crackles or wheezing audible.  Respirations even and unlabored.  RADIAL PULSE:  Equal bilaterally.  ABDOMEN:  Soft, nontender.  Bowel sounds present and normal.  No audible abdominal bruit.  No pain to palpation over the hernia.     EXTREMITIES:  No increased edema present.  DP pulses palpable and equal bilaterally.          Assessment & Plan:  HISTORY OF LEFT CAROTID BRUIT.  Carotid ultrasound 9/12 revealed no significant stenosis.  Asymptomatic.  Continue daily aspirin.   CARDIOVASCULAR.  Stable.  Continue risk factor modification. Has a known murmur.  Stable.    INCREASED PSYCHOSOCIAL STRESSORS.  On Zoloft.  Stable.  Follow.   HEALTH MAINTENANCE.  Physical 09/13/13.  Is s/p hysterectomy.  Does not require yearly pap smears.  Colonoscopy 06/02/11 revealed diverticulosis and a lipoma.  Breast cancer recently diagnosed as outlined.  Mammograms ordered through Dr Bary Castilla.  Last mammogram 07/18/13 - Birads I.  Recommend f/u in one year.  Is scheduled follow up in 08/2014.  Bone density 05/26/10 normal.  Schedule f/u bone density - especially on tamoxifen.     Problem List Items Addressed This Visit   Abnormal liver function test     Last liver panel wnl.  Follow liver panel.      Relevant Orders      Hepatic function panel   Diverticulosis     Bowels relatively stable.  Colonoscopy 06/02/11 revealed diverticulosis and a lipoma.  Better on probiotics.        Environmental allergies     Seeing Dr Tami Ribas.  Receiving allergy shots now.  Follow.      GERD (gastroesophageal reflux disease)     Symptoms controlled.       Hypercholesterolemia      Unable to tolerate statins.  On niacin.  Low cholesterol diet and exercise.  Follow.   Lab Results  Component Value Date   CHOL 220* 05/15/2014   HDL 70.00 05/15/2014   LDLCALC 128* 05/15/2014   LDLDIRECT 101.4 05/07/2013   TRIG 111.0 05/15/2014   CHOLHDL 3  05/15/2014       Relevant Medications      hydrochlorothiazide tablet      benazepril (LOTENSIN) tablet   Other Relevant Orders      Lipid panel   Hyperglycemia     Low carb diet and exercise.  Follow met b and a1c.       Relevant Orders  Hemoglobin A1c   Hypertension - Primary     Blood pressure is under good control.  Same meds.  Follow metabolic panel.  Cr just checked - .8.        Relevant Medications      hydrochlorothiazide tablet      benazepril (LOTENSIN) tablet   Other Relevant Orders      Basic metabolic panel   Neoplasm of left breast, primary tumor 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)     On Tamoxifen and doing well.  Mammogram 07/18/13 - Birads I.  Continue to follow up with Dr Bary Castilla.       Relevant Orders      DG Bone Density    Other Visit Diagnoses   Encounter for immunization        Long term use of drug        Relevant Orders       DG Bone Density    Estrogen deficiency        Relevant Orders       DG Bone Density      I spent 25 minutes with the patient and more than 50% of the time was spent in consultation regarding the above.

## 2014-05-27 ENCOUNTER — Encounter: Payer: Self-pay | Admitting: Internal Medicine

## 2014-05-27 ENCOUNTER — Other Ambulatory Visit: Payer: Self-pay | Admitting: Internal Medicine

## 2014-05-27 NOTE — Assessment & Plan Note (Signed)
Low carb diet and exercise.  Follow met b and a1c.    

## 2014-05-27 NOTE — Assessment & Plan Note (Signed)
Seeing Dr Tami Ribas.  Receiving allergy shots now.  Follow.

## 2014-05-27 NOTE — Assessment & Plan Note (Signed)
On Tamoxifen and doing well.  Mammogram 07/18/13 - Birads I.  Continue to follow up with Dr Bary Castilla.

## 2014-05-27 NOTE — Assessment & Plan Note (Addendum)
Blood pressure is under good control.  Same meds.  Follow metabolic panel.  Cr just checked - .8.

## 2014-05-27 NOTE — Assessment & Plan Note (Signed)
Last liver panel wnl.  Follow liver panel.

## 2014-05-27 NOTE — Assessment & Plan Note (Signed)
Bowels relatively stable.  Colonoscopy 06/02/11 revealed diverticulosis and a lipoma.  Better on probiotics.

## 2014-05-27 NOTE — Assessment & Plan Note (Signed)
Unable to tolerate statins.  On niacin.  Low cholesterol diet and exercise.  Follow.   Lab Results  Component Value Date   CHOL 220* 05/15/2014   HDL 70.00 05/15/2014   LDLCALC 128* 05/15/2014   LDLDIRECT 101.4 05/07/2013   TRIG 111.0 05/15/2014   CHOLHDL 3 05/15/2014

## 2014-05-27 NOTE — Progress Notes (Signed)
Opened in error

## 2014-05-27 NOTE — Assessment & Plan Note (Signed)
Symptoms controlled

## 2014-06-03 ENCOUNTER — Encounter: Payer: Self-pay | Admitting: Internal Medicine

## 2014-06-03 LAB — HM DEXA SCAN

## 2014-06-04 ENCOUNTER — Encounter: Payer: Self-pay | Admitting: *Deleted

## 2014-06-07 ENCOUNTER — Telehealth: Payer: Self-pay | Admitting: Internal Medicine

## 2014-06-07 ENCOUNTER — Encounter: Payer: Self-pay | Admitting: Internal Medicine

## 2014-06-07 NOTE — Telephone Encounter (Signed)
Pt notified of bone density results via my chart.  

## 2014-06-10 NOTE — Telephone Encounter (Signed)
Unread mychart message mailed to patient 

## 2014-07-08 ENCOUNTER — Encounter: Payer: Self-pay | Admitting: Internal Medicine

## 2014-07-08 DIAGNOSIS — R140 Abdominal distension (gaseous): Secondary | ICD-10-CM

## 2014-07-08 DIAGNOSIS — R110 Nausea: Secondary | ICD-10-CM

## 2014-07-08 NOTE — Telephone Encounter (Signed)
Order placed for referral to GI.  Dr Hilarie Fredrickson.

## 2014-07-09 ENCOUNTER — Encounter: Payer: Self-pay | Admitting: Internal Medicine

## 2014-08-07 ENCOUNTER — Encounter: Payer: Self-pay | Admitting: General Surgery

## 2014-08-12 ENCOUNTER — Other Ambulatory Visit: Payer: Self-pay | Admitting: *Deleted

## 2014-08-12 MED ORDER — POTASSIUM CHLORIDE ER 10 MEQ PO TBCR: 10.0000 meq | EXTENDED_RELEASE_TABLET | Freq: Two times a day (BID) | ORAL | Status: DC

## 2014-08-13 ENCOUNTER — Encounter: Payer: Self-pay | Admitting: *Deleted

## 2014-08-21 ENCOUNTER — Ambulatory Visit (INDEPENDENT_AMBULATORY_CARE_PROVIDER_SITE_OTHER): Payer: Medicare Other | Admitting: Internal Medicine

## 2014-08-21 ENCOUNTER — Other Ambulatory Visit (INDEPENDENT_AMBULATORY_CARE_PROVIDER_SITE_OTHER): Payer: Medicare Other

## 2014-08-21 ENCOUNTER — Encounter: Payer: Self-pay | Admitting: Internal Medicine

## 2014-08-21 VITALS — BP 120/68 | HR 72 | Ht 63.0 in | Wt 185.4 lb

## 2014-08-21 DIAGNOSIS — IMO0001 Reserved for inherently not codable concepts without codable children: Secondary | ICD-10-CM

## 2014-08-21 DIAGNOSIS — R1013 Epigastric pain: Secondary | ICD-10-CM

## 2014-08-21 DIAGNOSIS — K589 Irritable bowel syndrome without diarrhea: Secondary | ICD-10-CM

## 2014-08-21 DIAGNOSIS — R143 Flatulence: Secondary | ICD-10-CM

## 2014-08-21 DIAGNOSIS — R140 Abdominal distension (gaseous): Secondary | ICD-10-CM

## 2014-08-21 LAB — CBC WITH DIFFERENTIAL/PLATELET
Basophils Absolute: 0 10*3/uL (ref 0.0–0.1)
Basophils Relative: 0.8 % (ref 0.0–3.0)
Eosinophils Absolute: 0.2 10*3/uL (ref 0.0–0.7)
Eosinophils Relative: 2.4 % (ref 0.0–5.0)
HCT: 40.6 % (ref 36.0–46.0)
HEMOGLOBIN: 14 g/dL (ref 12.0–15.0)
LYMPHS PCT: 32.8 % (ref 12.0–46.0)
Lymphs Abs: 2.2 10*3/uL (ref 0.7–4.0)
MCHC: 34.4 g/dL (ref 30.0–36.0)
MCV: 91.4 fl (ref 78.0–100.0)
MONOS PCT: 10.2 % (ref 3.0–12.0)
Monocytes Absolute: 0.7 10*3/uL (ref 0.1–1.0)
NEUTROS PCT: 53.8 % (ref 43.0–77.0)
Neutro Abs: 3.6 10*3/uL (ref 1.4–7.7)
Platelets: 201 10*3/uL (ref 150.0–400.0)
RBC: 4.44 Mil/uL (ref 3.87–5.11)
RDW: 13.6 % (ref 11.5–15.5)
WBC: 6.6 10*3/uL (ref 4.0–10.5)

## 2014-08-21 LAB — COMPREHENSIVE METABOLIC PANEL
ALK PHOS: 44 U/L (ref 39–117)
ALT: 23 U/L (ref 0–35)
AST: 26 U/L (ref 0–37)
Albumin: 4.1 g/dL (ref 3.5–5.2)
BILIRUBIN TOTAL: 0.5 mg/dL (ref 0.2–1.2)
BUN: 13 mg/dL (ref 6–23)
CO2: 30 mEq/L (ref 19–32)
Calcium: 9.8 mg/dL (ref 8.4–10.5)
Chloride: 101 mEq/L (ref 96–112)
Creatinine, Ser: 0.77 mg/dL (ref 0.40–1.20)
GFR: 78.98 mL/min (ref >60.00)
GLUCOSE: 109 mg/dL — AB (ref 70–99)
Potassium: 3.9 mEq/L (ref 3.5–5.1)
SODIUM: 137 meq/L (ref 135–145)
Total Protein: 6.7 g/dL (ref 6.0–8.3)

## 2014-08-21 LAB — IGA: IgA: 191 mg/dL (ref 68–378)

## 2014-08-21 MED ORDER — PANTOPRAZOLE SODIUM 40 MG PO TBEC: 40.0000 mg | DELAYED_RELEASE_TABLET | Freq: Every day | ORAL | Status: DC

## 2014-08-21 NOTE — Patient Instructions (Signed)
You have been scheduled for an endoscopy. Please follow written instructions given to you at your visit today. If you use inhalers (even only as needed), please bring them with you on the day of your procedure. Your physician has requested that you go to www.startemmi.com and enter the access code given to you at your visit today. This web site gives a general overview about your procedure. However, you should still follow specific instructions given to you by our office regarding your preparation for the procedure.  Your physician has requested that you go to the basement for the following lab work before leaving today: Celiac, CBC, CMP,   Please take your Protonix once daily before supper.  CC:Dr [employer]

## 2014-08-21 NOTE — Progress Notes (Signed)
Patient ID: Helen Marshall, female   DOB: 22-Nov-1945, 69 y.o.   MRN: [id number] HPI: Helen Marshall is a 69 yo female with PMH of breast cancer s/p excision, IBS, diverticulosis, GERD, HTN, HL, gallbladder dx s/p cholecystectomy in (2001), who is seen in consultation at the request of Dr. Nicki Reaper to evaluate epigastric pain and upper abdominal bloating symptomatology. She is here alone today. She reports she has had long-standing and years of her current symptoms. She reports feeling upper abdominal bloating which she describes as "pockets of gas". This can be across the upper abdomen but also in the midaxillary line on the right greater than left. She will feel epigastric type abdominal pain on occasion after eating and also sometimes at night. She was previous he taking daily aspirin which was stopped in October. This seemed to improve epigastric burning and when she restarted the medicine the burning came back to she has since stopped again. She is using lysine which she thinks has helped with some of her abdominal bloating. She also has used probiotics on and off. Bowel movements can vary from loose to constipated than normal. She usually has 1-2 bowel movements per day she denies blood in her stool or melena. She had a colonoscopy in 2012 which revealed diverticulosis but no polyps. There was possibly a remote history of colon polyps. Family history notable for sisters with irritable bowel syndrome. He denies weight loss, early satiety. Some mild nausea at times but no vomiting. No heartburn, dysphagia or odynophagia. She has a prescription for pantoprazole witnessed twice daily but she takes this 3-4 days per week.  Cholecystectomy was performed in [place] in 2001 for what sounds like acute cholecystitis.  Past Medical History  Diagnosis Date  . Hypertension   . Hypercholesterolemia   . IBS (irritable bowel syndrome)   . GERD (gastroesophageal reflux disease)   . Carotid bruit     left  .  Diverticulosis   . Hyperglycemia   . Lumbar disc disease   . Environmental allergies   . Heart murmur   . Diverticulosis 2012  . Abnormal mammogram 12/19/2012    left  . Cancer     Breast  . 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 Wide local excision, sentinel node biopsy, MammoSite partial breast radiation..  . Lipoma of colon   . Colon polyps     Past Surgical History  Procedure Laterality Date  . Cholecystectomy  2001  . Rotator cuff surgery  1998  . Nasal sinus surgery  1997  . Tubal ligation  1976  . Abdominal hysterectomy  1989    fibroids  . Colonoscopy  2012  . Ankle surgery Left 1999  . 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 Breast surgery Left 1970    biopsy  . Hernia repair  2001, 2004    Outpatient Prescriptions Prior to Visit  Medication Sig Dispense Refill  . amLODipine (NORVASC) 5 MG tablet Take 1 tablet (5 mg total) by mouth daily. 90 tablet 1  . benazepril (LOTENSIN) 40 MG tablet Take 1 tablet (40 mg total) by mouth daily. 90 tablet 3  . calcium-vitamin D (CALCIUM 500+D) 500-400 MG-UNIT per tablet Take 1 tablet by mouth daily.     . Cholecalciferol (VITAMIN D PO) Take by mouth.    . fish oil-omega-3 fatty acids 1000 MG capsule 5 (five) times daily.     Marland Kitchen  fluticasone (FLONASE) 50 MCG/ACT nasal spray Place 2 sprays into both nostrils daily. (Patient taking differently: Place 2 sprays into both nostrils as needed. ) 16 g 5  . hydrochlorothiazide (HYDRODIURIL) 25 MG tablet Take 1 tablet (25 mg total) by mouth daily. 90 tablet 3  . Multiple Vitamin (MULTIVITAMIN) tablet Take 1 tablet by mouth daily.    . Multiple Vitamins-Minerals (EYE VITAMINS PO) Take by mouth.    . niacin (NIASPAN) 1000 MG CR tablet Take 1,000 mg by mouth 2 (two) times daily.    . potassium chloride (K-DUR) 10 MEQ tablet Take 1 tablet (10 mEq total) by mouth 2 (two) times daily. 60 tablet 2  . tamoxifen  (NOLVADEX) 20 MG tablet Take 1 tablet (20 mg total) by mouth daily. 30 tablet 11  . pantoprazole (PROTONIX) 40 MG tablet Take 1 tablet (40 mg total) by mouth 2 (two) times daily. 60 tablet 5   No facility-administered medications prior to visit.    Allergies  Allergen Reactions  . Crestor [Rosuvastatin] Other (See Comments)    Leg cramping   . Demerol [Meperidine] Nausea And Vomiting  . Latex Other (See Comments)    Redness, hands breakout  . Lipitor [Atorvastatin] Other (See Comments)    Intolerant   . Lovastatin Other (See Comments)    Elevated CK  . Pravastatin Other (See Comments)    intolerant  . Tramadol Nausea Only  . Vicodin [Hydrocodone-Acetaminophen] Other (See Comments)    itching  . Zocor [Simvastatin] Other (See Comments)    Intolerant     Family History  Problem Relation Age of Onset  . CVA Father   . Alcohol abuse Father   . Heart disease Mother     myocardial infarction  . Diabetes Mother   . Hypertension Brother     x2  . Alcohol abuse Brother   . Arthritis/Rheumatoid Sister     x2  . Breast cancer Sister 67  . Osteoarthritis Sister   . Asthma Sister   . Hypothyroidism Sister   . Cancer Other     breast - paternal first cousin  . Colon cancer Maternal Uncle   . Colon polyps Son   . Colon polyps Sister     History  Substance Use Topics  . Smoking status: Never Smoker   . Smokeless tobacco: Never Used  . Alcohol Use: No    ROS: As per history of present illness, otherwise negative  BP 120/68 mmHg  Pulse 72  Ht 5\' 3"  (1.6 m)  Wt 185 lb 6 oz (84.086 kg)  BMI 32.85 kg/m2  LMP 07/21/1988 Constitutional: Well-developed and well-nourished. No distress. HEENT: Normocephalic and atraumatic. Oropharynx is clear and moist. No oropharyngeal exudate. Conjunctivae are normal.  No scleral icterus. Neck: Neck supple. Trachea midline. Cardiovascular: Normal rate, regular rhythm and intact distal pulses. No M/R/G Pulmonary/chest: Effort normal and  breath sounds normal. No wheezing, rales or rhonchi. Abdominal: Soft, nontender, nondistended. Bowel sounds active throughout. There are no masses palpable. No hepatosplenomegaly. Extremities: no clubbing, cyanosis, or edema Lymphadenopathy: No cervical adenopathy noted. Neurological: Alert and oriented to person place and time. Skin: Skin is warm and dry. No rashes noted. Psychiatric: Normal mood and affect. Behavior is normal.  RELEVANT LABS AND IMAGING: CBC    Component Value Date/Time   WBC 4.4 01/16/2014 0802   RBC 4.17 01/16/2014 0802   HGB 13.1 01/16/2014 0802   HCT 38.6 01/16/2014 0802   PLT 198.0 01/16/2014 0802   MCV 92.5 01/16/2014 0802  MCHC 33.9 01/16/2014 0802   RDW 14.6 01/16/2014 0802   LYMPHSABS 1.6 01/16/2014 0802   MONOABS 0.5 01/16/2014 0802   EOSABS 0.1 01/16/2014 0802   BASOSABS 0.0 01/16/2014 0802    CMP     Component Value Date/Time   NA 138 05/15/2014 0953   K 3.9 05/15/2014 0953   CL 104 05/15/2014 0953   CO2 28 05/15/2014 0953   GLUCOSE 107* 05/15/2014 0953   BUN 9 05/15/2014 0953   CREATININE 0.8 05/15/2014 0953   CALCIUM 8.9 05/15/2014 0953   PROT 6.9 05/15/2014 0953   ALBUMIN 3.3* 05/15/2014 0953   AST 32 05/15/2014 0953   ALT 31 05/15/2014 0953   ALKPHOS 44 05/15/2014 0953   BILITOT 0.7 05/15/2014 0953   Colonoscopy 06/02/2011, Dr. Candace Cruise -- a few large and small mouth diverticula in the sigmoid, sigmoid lipoma small. Otherwise normal examination. Good bowel preparation and exam was to the cecum  ASSESSMENT/PLAN:  69 yo female with PMH of breast cancer s/p excision, IBS, diverticulosis, GERD, HTN, HL, gallbladder dx s/p cholecystectomy in (2001), who is seen in consultation at the request of Dr. Nicki Reaper to evaluate epigastric pain and upper abdominal bloating symptomatology.  1. Dyspepsia/abdominal bloating -- her symptoms are most consistent with dyspepsia and irritable bowel. They are long-standing in nature. I would like to repeat upper  endoscopy, last was many years ago to ensure no significant gastritis and no H. pylori infection. She may have a component of bacterial overgrowth. I would also like to rule out celiac disease. Labs today to include CBC, CMP and celiac panel. TSH checked within the last year was normal. I encouraged a low gas/low bloating diet. I would like her to use pantoprazole 40 mg before dinner each night to help with dyspepsia and epigastric abdominal discomfort. Her the recommendations after upper endoscopy. Consider bacterial overgrowth treatment empirically if EGD neg.  2. CRC screening -- normal colonoscopy without polyps in 2012, repeat would be recommended in 2022

## 2014-08-22 ENCOUNTER — Ambulatory Visit (AMBULATORY_SURGERY_CENTER): Payer: Medicare Other | Admitting: Internal Medicine

## 2014-08-22 ENCOUNTER — Encounter: Payer: Self-pay | Admitting: Internal Medicine

## 2014-08-22 VITALS — BP 141/75 | HR 69 | Temp 97.0°F | Resp 18 | Ht 63.0 in | Wt 185.0 lb

## 2014-08-22 DIAGNOSIS — K219 Gastro-esophageal reflux disease without esophagitis: Secondary | ICD-10-CM

## 2014-08-22 DIAGNOSIS — R1013 Epigastric pain: Secondary | ICD-10-CM

## 2014-08-22 DIAGNOSIS — K2950 Unspecified chronic gastritis without bleeding: Secondary | ICD-10-CM

## 2014-08-22 HISTORY — PX: UPPER GI ENDOSCOPY: SHX6162

## 2014-08-22 LAB — TISSUE TRANSGLUTAMINASE, IGA: TISSUE TRANSGLUTAMINASE AB, IGA: 1 U/mL (ref <4)

## 2014-08-22 MED ORDER — SODIUM CHLORIDE 0.9 % IV SOLN: 500.0000 mL | INTRAVENOUS | Status: DC

## 2014-08-22 NOTE — Op Note (Signed)
[place]  [address] [place], [address]   ENDOSCOPY PROCEDURE REPORT  PATIENT: Helen Marshall, Helen Marshall  MR#: [id number] BIRTHDATE: 02-17-1946 , 68  yrs. old GENDER: female ENDOSCOPIST: Jerene Bears, MD REFERRED BY:   Einar Pheasant, MD PROCEDURE DATE:  08/22/2014 PROCEDURE:  EGD w/ biopsy for H.pylori ASA CLASS:     Class III INDICATIONS:  dyspepsia and abdominal bloating. MEDICATIONS: Monitored anesthesia care and Propofol 200 mg IV TOPICAL ANESTHETIC: none  DESCRIPTION OF PROCEDURE: After the risks benefits and alternatives of the procedure were thoroughly explained, informed consent was obtained.  The LB [id number] endoscope was introduced through the mouth and advanced to the second portion of the duodenum , Without limitations.  The instrument was slowly withdrawn as the mucosa was fully examined.  ESOPHAGUS: The mucosa of the esophagus appeared normal.   Regular z-line at 40 cm.  STOMACH: Mild gastropathy was found in the gastric antrum.  Cold forcep biopsies were taken at the gastric body, antrum and angularis to evaluate for h.  pylori.   The stomach otherwise appeared normal.  DUODENUM: The duodenal mucosa showed no abnormalities in the bulb and 2nd part of the duodenum.  Retroflexed views revealed no abnormalities.     The scope was then withdrawn from the patient and the procedure completed.  COMPLICATIONS: There were no immediate complications.  ENDOSCOPIC IMPRESSION: 1.   The mucosa of the esophagus appeared normal 2.   Mild gastropathy was found in the gastric antrum 3.   The stomach otherwise appeared normal 4.   The duodenal mucosa showed no abnormalities in the bulb and 2nd part of the duodenum  RECOMMENDATIONS: 1.  Await biopsy results 2.  Await recent labs, today's pathology report and if negative, then I recommend empiric treatment for SIBO (bacterial overgrowth)   eSigned:  Jerene Bears, MD 08/22/2014 10:34  AM    CC:The Patient, Einar Pheasant, MD

## 2014-08-22 NOTE — Progress Notes (Signed)
Called to room to assist during endoscopic procedure.  Patient ID and intended procedure confirmed with present staff. Received instructions for my participation in the procedure from the performing physician.  

## 2014-08-22 NOTE — Patient Instructions (Signed)
Discharge instructions given. Biopsies taken. Resume previous medications. Your may notice some irritation in your nose or some drainage.  This may cause feelings of congestion.  This is from the oxygen, which can be very drying.  There is no need for concern, this should clear up in a day or so YOU HAD AN ENDOSCOPIC PROCEDURE TODAY AT [place]: Refer to the procedure report that was given to you for any specific questions about what was found during the examination.  If the procedure report does not answer your questions, please call your gastroenterologist to clarify.  If you requested that your care partner not be given the details of your procedure findings, then the procedure report has been included in a sealed envelope for you to review at your convenience later.  YOU SHOULD EXPECT: Some feelings of bloating in the abdomen. Passage of more gas than usual.  Walking can help get rid of the air that was put into your GI tract during the procedure and reduce the bloating. If you had a lower endoscopy (such as a colonoscopy or flexible sigmoidoscopy) you may notice spotting of blood in your stool or on the toilet paper. If you underwent a bowel prep for your procedure, then you may not have a normal bowel movement for a few days.  DIET: Your first meal following the procedure should be a light meal and then it is ok to progress to your normal diet.  A half-sandwich or bowl of soup is an example of a good first meal.  Heavy or fried foods are harder to digest and may make you feel nauseous or bloated.  Likewise meals heavy in dairy and vegetables can cause extra gas to form and this can also increase the bloating.  Drink plenty of fluids but you should avoid alcoholic beverages for 24 hours.  ACTIVITY: Your care partner should take you home directly after the procedure.  You should plan to take it easy, moving slowly for the rest of the day.  You can resume normal activity the day  after the procedure however you should NOT DRIVE or use heavy machinery for 24 hours (because of the sedation medicines used during the test).    SYMPTOMS TO REPORT IMMEDIATELY: A gastroenterologist can be reached at any hour.  During normal business hours, 8:30 AM to 5:00 PM Monday through Friday, call [phone].  After hours and on weekends, please call the GI answering service at [phone] who will take a message and have the physician on call contact you.   Following upper endoscopy (EGD)  Vomiting of blood or coffee ground material  New chest pain or pain under the shoulder blades  Painful or persistently difficult swallowing  New shortness of breath  Fever of 100F or higher  Black, tarry-looking stools  FOLLOW UP: If any biopsies were taken you will be contacted by phone or by letter within the next 1-3 weeks.  Call your gastroenterologist if you have not heard about the biopsies in 3 weeks.  Our staff will call the home number listed on your records the next business day following your procedure to check on you and address any questions or concerns that you may have at that time regarding the information given to you following your procedure. This is a courtesy call and so if there is no answer at the home number and we have not heard from you through the emergency physician on call, we will assume that you have  returned to your regular daily activities without incident.  SIGNATURES/CONFIDENTIALITY: You and/or your care partner have signed paperwork which will be entered into your electronic medical record.  These signatures attest to the fact that that the information above on your After Visit Summary has been reviewed and is understood.  Full responsibility of the confidentiality of this discharge information lies with you and/or your care-partner.

## 2014-08-22 NOTE — Progress Notes (Signed)
Report to PACU, RN, vss, BBS= Clear.  

## 2014-08-26 ENCOUNTER — Telehealth: Payer: Self-pay

## 2014-08-26 NOTE — Telephone Encounter (Signed)
  Follow up Call-  Call back number 08/22/2014  Post procedure Call Back phone  # [phone]  Permission to leave phone message Yes     Patient questions:  Do you have a fever, pain , or abdominal swelling? No. Pain Score  0 *  Have you tolerated food without any problems? Yes.    Have you been able to return to your normal activities? Yes.    Do you have any questions about your discharge instructions: Diet   No. Medications  No. Follow up visit  No.  Do you have questions or concerns about your Care? No.  Actions: * If pain score is 4 or above: No action needed, pain <4.

## 2014-08-29 ENCOUNTER — Ambulatory Visit (INDEPENDENT_AMBULATORY_CARE_PROVIDER_SITE_OTHER): Payer: Medicare Other | Admitting: General Surgery

## 2014-08-29 ENCOUNTER — Encounter: Payer: Self-pay | Admitting: General Surgery

## 2014-08-29 VITALS — BP 120/68 | HR 88 | Resp 16

## 2014-08-29 DIAGNOSIS — D0512 Intraductal carcinoma in situ of left breast: Secondary | ICD-10-CM

## 2014-08-29 NOTE — Patient Instructions (Addendum)
Continue self breast exams. Call office for any new breast issues or concerns.  Gastroesophageal Reflux Disease, Adult Gastroesophageal reflux disease (GERD) happens when acid from your stomach flows up into the esophagus. When acid comes in contact with the esophagus, the acid causes soreness (inflammation) in the esophagus. Over time, GERD may create small holes (ulcers) in the lining of the esophagus. CAUSES   Increased body weight. This puts pressure on the stomach, making acid rise from the stomach into the esophagus.  Smoking. This increases acid production in the stomach.  Drinking alcohol. This causes decreased pressure in the lower esophageal sphincter (valve or ring of muscle between the esophagus and stomach), allowing acid from the stomach into the esophagus.  Late evening meals and a full stomach. This increases pressure and acid production in the stomach.  A malformed lower esophageal sphincter. Sometimes, no cause is found. SYMPTOMS   Burning pain in the lower part of the mid-chest behind the breastbone and in the mid-stomach area. This may occur twice a week or more often.  Trouble swallowing.  Sore throat.  Dry cough.  Asthma-like symptoms including chest tightness, shortness of breath, or wheezing. DIAGNOSIS  Your caregiver may be able to diagnose GERD based on your symptoms. In some cases, X-rays and other tests may be done to check for complications or to check the condition of your stomach and esophagus. TREATMENT  Your caregiver may recommend over-the-counter or prescription medicines to help decrease acid production. Ask your caregiver before starting or adding any new medicines.  HOME CARE INSTRUCTIONS   Change the factors that you can control. Ask your caregiver for guidance concerning weight loss, quitting smoking, and alcohol consumption.  Avoid foods and drinks that make your symptoms worse, such as:  Caffeine or alcoholic  drinks.  Chocolate.  Peppermint or mint flavorings.  Garlic and onions.  Spicy foods.  Citrus fruits, such as oranges, lemons, or limes.  Tomato-based foods such as sauce, chili, salsa, and pizza.  Fried and fatty foods.  Avoid lying down for the 3 hours prior to your bedtime or prior to taking a nap.  Eat small, frequent meals instead of large meals.  Wear loose-fitting clothing. Do not wear anything tight around your waist that causes pressure on your stomach.  Raise the head of your bed 6 to 8 inches with wood blocks to help you sleep. Extra pillows will not help.  Only take over-the-counter or prescription medicines for pain, discomfort, or fever as directed by your caregiver.  Do not take aspirin, ibuprofen, or other nonsteroidal anti-inflammatory drugs (NSAIDs). SEEK IMMEDIATE MEDICAL CARE IF:   You have pain in your arms, neck, jaw, teeth, or back.  Your pain increases or changes in intensity or duration.  You develop nausea, vomiting, or sweating (diaphoresis).  You develop shortness of breath, or you faint.  Your vomit is green, yellow, black, or looks like coffee grounds or blood.  Your stool is red, bloody, or black. These symptoms could be signs of other problems, such as heart disease, gastric bleeding, or esophageal bleeding. MAKE SURE YOU:   Understand these instructions.  Will watch your condition.  Will get help right away if you are not doing well or get worse. Document Released: 04/28/2005 Document Revised: 10/11/2011 Document Reviewed: 02/05/2011 [facility] Patient Information 2015 [place], [place]. This information is not intended to replace advice given to you by your health care provider. Make sure you discuss any questions you have with your health care provider.

## 2014-08-29 NOTE — Progress Notes (Signed)
Patient ID: Helen Marshall, female   DOB: [birth year], 69 y.o.   MRN: [id number]  Chief Complaint  Patient presents with  . Follow-up    mammogram    HPI Helen Marshall is a 69 y.o. female  Here for follow up from mammogram done on 08/06/14, Cat 2. She has a history of left breast cancer with surgical lumpectomy. She has no breast complaints. She had an upper endoscopy with biopsies taken last week due to problems with indigestion. (Sees this exam was not completed at [facility] based on review of the electronic record and are not available for review.)    HPI  Past Medical History  Diagnosis Date  . Hypertension   . Hypercholesterolemia   . IBS (irritable bowel syndrome)   . GERD (gastroesophageal reflux disease)   . Carotid bruit     left  . Diverticulosis   . Hyperglycemia   . Lumbar disc disease   . Environmental allergies   . Heart murmur   . Diverticulosis 2012  . Abnormal mammogram 12/19/2012    left  . Cancer     Breast  . 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  . Lipoma of colon   . Colon polyps   . Allergy   . Cataract     Past Surgical History  Procedure Laterality Date  . Cholecystectomy  2001  . Rotator cuff surgery  1998  . Nasal sinus surgery  1997  . Tubal ligation  1976  . Abdominal hysterectomy  1989    fibroids  . Colonoscopy  06/02/2011    Verdie Shire, MD; diverticulosis, submucosal lipoma of the sigmoid colon.  . Ankle surgery Left 1999  . 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 Breast surgery Left 1970    biopsy  . Hernia repair  2001, 2004  . Upper gi endoscopy  08/22/14    Dr Billie Lade    Family History  Problem Relation Age of Onset  . CVA Father   . Alcohol abuse Father   . Heart disease Mother     myocardial infarction  . Diabetes Mother   . Hypertension Brother     x2   . Alcohol abuse Brother   . Arthritis/Rheumatoid Sister     x2  . Breast cancer Sister 77  . Osteoarthritis Sister   . Asthma Sister   . Hypothyroidism Sister   . Cancer Other     breast - paternal first cousin  . Colon cancer Maternal Uncle   . Colon polyps Son   . Colon polyps Sister     Social History History  Substance Use Topics  . Smoking status: Never Smoker   . Smokeless tobacco: Never Used  . Alcohol Use: No    Allergies  Allergen Reactions  . Crestor [Rosuvastatin] Other (See Comments)    Leg cramping   . Demerol [Meperidine] Nausea And Vomiting  . Latex Other (See Comments)    Redness, hands breakout  . Lipitor [Atorvastatin] Other (See Comments)    Intolerant   . Lovastatin Other (See Comments)    Elevated CK  . Pravastatin Other (See Comments)    intolerant  . Tramadol Nausea Only  . Vicodin [Hydrocodone-Acetaminophen] Other (See Comments)    itching  . Zocor [Simvastatin] Other (See Comments)    Intolerant  Current Outpatient Prescriptions  Medication Sig Dispense Refill  . amLODipine (NORVASC) 5 MG tablet Take 1 tablet (5 mg total) by mouth daily. 90 tablet 1  . benazepril (LOTENSIN) 40 MG tablet Take 1 tablet (40 mg total) by mouth daily. 90 tablet 3  . calcium-vitamin D (CALCIUM 500+D) 500-400 MG-UNIT per tablet Take 1 tablet by mouth daily.     Marland Kitchen dextromethorphan-guaiFENesin (MUCINEX DM) 30-600 MG per 12 hr tablet Take 1 tablet by mouth 2 (two) times daily.    . fluticasone (FLONASE) 50 MCG/ACT nasal spray Place 2 sprays into both nostrils daily. (Patient taking differently: Place 2 sprays into both nostrils as needed. ) 16 g 5  . hydrochlorothiazide (HYDRODIURIL) 25 MG tablet Take 1 tablet (25 mg total) by mouth daily. 90 tablet 3  . niacin (NIASPAN) 1000 MG CR tablet Take 1,000 mg by mouth 2 (two) times daily.    . pantoprazole (PROTONIX) 40 MG tablet Take 1 tablet (40 mg total) by mouth daily before supper. 30 tablet 0  . potassium  chloride (K-DUR) 10 MEQ tablet Take 1 tablet (10 mEq total) by mouth 2 (two) times daily. 60 tablet 2  . tamoxifen (NOLVADEX) 20 MG tablet Take 1 tablet (20 mg total) by mouth daily. 30 tablet 11  . triamcinolone cream (KENALOG) 0.1 % Apply 1 application topically as needed.     . fish oil-omega-3 fatty acids 1000 MG capsule 5 (five) times daily.     . Multiple Vitamin (MULTIVITAMIN) tablet Take 1 tablet by mouth daily.    . Multiple Vitamins-Minerals (EYE VITAMINS PO) Take by mouth.     No current facility-administered medications for this visit.    Review of Systems Review of Systems  Constitutional: Negative.   Respiratory: Negative.   Cardiovascular: Negative.     Blood pressure 120/68, pulse 88, resp. rate 16, last menstrual period 07/21/1988.  Physical Exam Physical Exam  Constitutional: She is oriented to person, place, and time. She appears well-developed and well-nourished.  Neck: Neck supple.  Cardiovascular: Normal rate and regular rhythm.   Murmur heard.  Systolic murmur is present with a grade of 2/6  Pulmonary/Chest: Effort normal and breath sounds normal. Right breast exhibits no inverted nipple, no mass, no nipple discharge, no skin change and no tenderness. Left breast exhibits no inverted nipple, no mass, no nipple discharge, no skin change and no tenderness.  Well healed incision at 3 o'clock left breast  Lymphadenopathy:    She has no cervical adenopathy.    She has no axillary adenopathy.  Neurological: She is alert and oriented to person, place, and time.  Skin: Skin is warm and dry.    Data Reviewed Bilateral diagnostic mammograms dated 08/06/2014 completed at [facility] showed post lumpectomy changes. No interval change. BI-RADS-2. These films were independently reviewed.  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    Plan    We'll arrange for a follow-up examination in one year.     The patient has been asked to  return to the office in one year with a bilateral diagnostic mammogram.  PCP:  Nash Shearer 08/31/2014, 10:26 AM

## 2014-08-30 ENCOUNTER — Encounter: Payer: Self-pay | Admitting: Internal Medicine

## 2014-08-31 ENCOUNTER — Encounter: Payer: Self-pay | Admitting: General Surgery

## 2014-09-02 ENCOUNTER — Other Ambulatory Visit: Payer: Self-pay

## 2014-09-02 ENCOUNTER — Telehealth: Payer: Self-pay

## 2014-09-02 MED ORDER — METRONIDAZOLE 500 MG PO TABS: 500.0000 mg | ORAL_TABLET | Freq: Three times a day (TID) | ORAL | Status: DC

## 2014-09-02 MED ORDER — SULFAMETHOXAZOLE-TRIMETHOPRIM 800-160 MG PO TABS: 1.0000 | ORAL_TABLET | Freq: Two times a day (BID) | ORAL | Status: DC

## 2014-09-02 NOTE — Telephone Encounter (Signed)
Would use cephalexin in place of sulfamethoxazole/trimethoprim Cephalexin 250 mg 4 times a day along with previously recommended dose of Flagyl

## 2014-09-02 NOTE — Telephone Encounter (Signed)
Dr Hilarie FredricksonJerene Pitch (phone [phone])  from [facility] called to let us know that she gets a medication interaction between bactrim and the potassium and benazepril patient is already on. Apparently, this combination of medications causes hyperkalemia (bun, creatinine and potassium 12 days ago were all wnl). Please advise. Okay to give or does she need something else instead?

## 2014-09-02 NOTE — Telephone Encounter (Signed)
Pt aware and scripts sent to pharmacy. Pt scheduled to see Dr. Hilarie Fredrickson 10/23/14@3 :30pm. Letter mailed to pt.

## 2014-09-02 NOTE — Telephone Encounter (Signed)
-----   Message from Jerene Bears, MD sent at 08/30/2014  5:38 PM EST ----- Bacterial overgrowth treatment Please prescribe: Metronidazole 500 mg 3 times a day 7 days Bactrim double strength 1 tablet twice a day 7 days Office follow-up in 6-8 weeks JMP

## 2014-09-03 MED ORDER — CEPHALEXIN 250 MG PO CAPS: 250.0000 mg | ORAL_CAPSULE | Freq: Four times a day (QID) | ORAL | Status: DC

## 2014-09-03 NOTE — Addendum Note (Signed)
Addended by: Larina Bras on: 09/03/2014 09:09 AM   Modules accepted: Orders, Medications

## 2014-09-03 NOTE — Telephone Encounter (Signed)
I have spoken to pharmacist at [facility] Arbie Cookey) to advise of Dr Vena Rua recommendation. She verbalizes understanding and will discontinue the bactrim rx. I have also spoken to patient to advise her of this. She verbalizes understanding as well.

## 2014-09-10 ENCOUNTER — Telehealth: Payer: Self-pay | Admitting: Internal Medicine

## 2014-09-10 NOTE — Telephone Encounter (Signed)
Would have her stop the cephalexin and complete metronidazole part of the therapy for SIBO Thanks

## 2014-09-10 NOTE — Telephone Encounter (Signed)
Patient reports that cephalexin makes her nauseated and bloated.  She has 2 days left.  She reports that I just want to ball up and go back to bed.  Please advise

## 2014-09-10 NOTE — Telephone Encounter (Signed)
Patient's husband will relay the information to his wife

## 2014-09-16 ENCOUNTER — Encounter: Payer: Self-pay | Admitting: Internal Medicine

## 2014-09-25 ENCOUNTER — Other Ambulatory Visit: Payer: 59

## 2014-09-27 ENCOUNTER — Encounter: Payer: 59 | Admitting: Internal Medicine

## 2014-10-23 ENCOUNTER — Ambulatory Visit: Payer: Medicare Other | Admitting: Internal Medicine

## 2014-11-01 ENCOUNTER — Encounter: Payer: Medicare Other | Admitting: Internal Medicine

## 2014-11-05 ENCOUNTER — Other Ambulatory Visit (INDEPENDENT_AMBULATORY_CARE_PROVIDER_SITE_OTHER): Payer: Medicare Other

## 2014-11-05 DIAGNOSIS — R739 Hyperglycemia, unspecified: Secondary | ICD-10-CM

## 2014-11-05 DIAGNOSIS — R7989 Other specified abnormal findings of blood chemistry: Secondary | ICD-10-CM | POA: Diagnosis not present

## 2014-11-05 DIAGNOSIS — E7800 Pure hypercholesterolemia, unspecified: Secondary | ICD-10-CM

## 2014-11-05 DIAGNOSIS — R945 Abnormal results of liver function studies: Secondary | ICD-10-CM

## 2014-11-05 DIAGNOSIS — I10 Essential (primary) hypertension: Secondary | ICD-10-CM | POA: Diagnosis not present

## 2014-11-05 LAB — BASIC METABOLIC PANEL
BUN: 11 mg/dL (ref 6–23)
CO2: 29 mEq/L (ref 19–32)
Calcium: 9.3 mg/dL (ref 8.4–10.5)
Chloride: 105 mEq/L (ref 96–112)
Creatinine, Ser: 0.71 mg/dL (ref 0.40–1.20)
GFR: 86.68 mL/min (ref >60.00)
GLUCOSE: 102 mg/dL — AB (ref 70–99)
Potassium: 3.8 mEq/L (ref 3.5–5.1)
SODIUM: 139 meq/L (ref 135–145)

## 2014-11-05 LAB — HEPATIC FUNCTION PANEL
ALBUMIN: 3.8 g/dL (ref 3.5–5.2)
ALT: 28 U/L (ref 0–35)
AST: 30 U/L (ref 0–37)
Alkaline Phosphatase: 51 U/L (ref 39–117)
Bilirubin, Direct: 0.1 mg/dL (ref 0.0–0.3)
Total Bilirubin: 0.3 mg/dL (ref 0.2–1.2)
Total Protein: 6.4 g/dL (ref 6.0–8.3)

## 2014-11-05 LAB — LIPID PANEL
CHOL/HDL RATIO: 3
Cholesterol: 211 mg/dL — ABNORMAL HIGH (ref 0–200)
HDL: 71.7 mg/dL (ref >39.00)
LDL CALC: 116 mg/dL — AB (ref 0–99)
NonHDL: 139.3
Triglycerides: 118 mg/dL (ref 0.0–149.0)
VLDL: 23.6 mg/dL (ref 0.0–40.0)

## 2014-11-05 LAB — HEMOGLOBIN A1C: Hgb A1c MFr Bld: 6.1 % (ref 4.6–6.5)

## 2014-11-06 ENCOUNTER — Encounter: Payer: Self-pay | Admitting: Internal Medicine

## 2014-11-08 ENCOUNTER — Encounter: Payer: Self-pay | Admitting: Internal Medicine

## 2014-11-08 ENCOUNTER — Ambulatory Visit (INDEPENDENT_AMBULATORY_CARE_PROVIDER_SITE_OTHER): Payer: Medicare Other | Admitting: Internal Medicine

## 2014-11-08 VITALS — BP 120/70 | HR 80 | Temp 98.1°F | Ht 63.0 in | Wt 188.1 lb

## 2014-11-08 DIAGNOSIS — E7800 Pure hypercholesterolemia, unspecified: Secondary | ICD-10-CM

## 2014-11-08 DIAGNOSIS — R739 Hyperglycemia, unspecified: Secondary | ICD-10-CM

## 2014-11-08 DIAGNOSIS — I10 Essential (primary) hypertension: Secondary | ICD-10-CM | POA: Diagnosis not present

## 2014-11-08 DIAGNOSIS — R945 Abnormal results of liver function studies: Secondary | ICD-10-CM

## 2014-11-08 DIAGNOSIS — Z23 Encounter for immunization: Secondary | ICD-10-CM

## 2014-11-08 DIAGNOSIS — C50419 Malignant neoplasm of upper-outer quadrant of unspecified female breast: Secondary | ICD-10-CM

## 2014-11-08 DIAGNOSIS — R7989 Other specified abnormal findings of blood chemistry: Secondary | ICD-10-CM | POA: Diagnosis not present

## 2014-11-08 DIAGNOSIS — K219 Gastro-esophageal reflux disease without esophagitis: Secondary | ICD-10-CM | POA: Diagnosis not present

## 2014-11-08 DIAGNOSIS — Z0000 Encounter for general adult medical examination without abnormal findings: Secondary | ICD-10-CM

## 2014-11-08 MED ORDER — FLUTICASONE PROPIONATE 50 MCG/ACT NA SUSP: 2.0000 | NASAL | Status: DC | PRN

## 2014-11-08 NOTE — Progress Notes (Signed)
Patient ID: Helen Marshall, female   DOB: 04-26-1946, 69 y.o.   MRN: [id number]   Subjective:    Patient ID: Helen Marshall, female    DOB: [birth year], 69 y.o.   MRN: [id number]  HPI  Patient here for her physical.  Bowels varying.  Trying to adjust her diet.  Tries to avoid beef.  Drinking buttermilk.  Saw Dr Hilarie Fredrickson.  Felt had bacterial overgrowth.  Treated with cipro and flagyl.  Some increased fatigue.  Not sleeping as well.  Husband wakes up.  Keeps her awake.  No cardiac symptoms with increased activity or exertion.  Breathing stable.     Past Medical History  Diagnosis Date  . Hypertension   . Hypercholesterolemia   . IBS (irritable bowel syndrome)   . GERD (gastroesophageal reflux disease)   . Carotid bruit     left  . Diverticulosis   . Hyperglycemia   . Lumbar disc disease   . Environmental allergies   . Heart murmur   . Diverticulosis 2012  . Abnormal mammogram 12/19/2012    left  . Cancer     Breast  . 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  . Lipoma of colon   . Colon polyps   . Allergy   . Cataract     Current Outpatient Prescriptions on File Prior to Visit  Medication Sig Dispense Refill  . amLODipine (NORVASC) 5 MG tablet Take 1 tablet (5 mg total) by mouth daily. 90 tablet 1  . benazepril (LOTENSIN) 40 MG tablet Take 1 tablet (40 mg total) by mouth daily. 90 tablet 3  . calcium-vitamin D (CALCIUM 500+D) 500-400 MG-UNIT per tablet Take 1 tablet by mouth daily.     Marland Kitchen dextromethorphan-guaiFENesin (MUCINEX DM) 30-600 MG per 12 hr tablet Take 1 tablet by mouth 2 (two) times daily.    . fish oil-omega-3 fatty acids 1000 MG capsule 5 (five) times daily.     . hydrochlorothiazide (HYDRODIURIL) 25 MG tablet Take 1 tablet (25 mg total) by mouth daily. 90 tablet 3  . Multiple Vitamin (MULTIVITAMIN) tablet Take 1 tablet by  mouth daily.    . Multiple Vitamins-Minerals (EYE VITAMINS PO) Take by mouth.    . niacin (NIASPAN) 1000 MG CR tablet Take 1,000 mg by mouth 2 (two) times daily.    . pantoprazole (PROTONIX) 40 MG tablet Take 1 tablet (40 mg total) by mouth daily before supper. 30 tablet 0  . potassium chloride (K-DUR) 10 MEQ tablet Take 1 tablet (10 mEq total) by mouth 2 (two) times daily. 60 tablet 2  . tamoxifen (NOLVADEX) 20 MG tablet Take 1 tablet (20 mg total) by mouth daily. 30 tablet 11  . triamcinolone cream (KENALOG) 0.1 % Apply 1 application topically as needed.      No current facility-administered medications on file prior to visit.    Review of Systems  Constitutional: Positive for fatigue. Negative for appetite change and unexpected weight change.  HENT: Negative for congestion and sinus pressure.   Eyes: Negative for pain and visual disturbance.  Respiratory: Negative for cough, chest tightness and shortness of breath.   Cardiovascular: Negative for chest pain, palpitations and leg swelling.  Gastrointestinal: Negative for nausea, vomiting and abdominal pain.       Bowels varying.    Genitourinary: Negative for dysuria and difficulty urinating.  Musculoskeletal: Negative  for back pain and joint swelling.  Skin: Negative for color change and rash.  Neurological: Negative for dizziness, light-headedness and headaches.  Hematological: Negative for adenopathy. Does not bruise/bleed easily.  Psychiatric/Behavioral: Negative for dysphoric mood and agitation.       Objective:     Blood pressure recheck:  132/72  Physical Exam  Constitutional: She is oriented to person, place, and time. She appears well-developed and well-nourished.  HENT:  Nose: Nose normal.  Mouth/Throat: Oropharynx is clear and moist.  Eyes: Right eye exhibits no discharge. Left eye exhibits no discharge. No scleral icterus.  Neck: Neck supple. No thyromegaly present.  Cardiovascular: Normal rate and regular rhythm.    Pulmonary/Chest: Breath sounds normal. No accessory muscle usage. No tachypnea. No respiratory distress. She has no decreased breath sounds. She has no wheezes. She has no rhonchi. Right breast exhibits no inverted nipple, no mass, no nipple discharge and no tenderness (no axillary adenopathy). Left breast exhibits no inverted nipple, no mass, no nipple discharge and no tenderness (no axilarry adenopathy).  Abdominal: Soft. Bowel sounds are normal. There is no tenderness.  Musculoskeletal: She exhibits no edema or tenderness.  Lymphadenopathy:    She has no cervical adenopathy.  Neurological: She is alert and oriented to person, place, and time.  Skin: Skin is warm. No rash noted.  Psychiatric: She has a normal mood and affect. Her behavior is normal.    BP 120/70 mmHg  Pulse 80  Temp(Src) 98.1 F (36.7 C) (Oral)  Ht '5\' 3"'  (1.6 m)  Wt 188 lb 2 oz (85.333 kg)  BMI 33.33 kg/m2  SpO2 97%  LMP 07/21/1988 Wt Readings from Last 3 Encounters:  11/08/14 188 lb 2 oz (85.333 kg)  08/22/14 185 lb (83.915 kg)  08/21/14 185 lb 6 oz (84.086 kg)     Lab Results  Component Value Date   WBC 6.6 08/21/2014   HGB 14.0 08/21/2014   HCT 40.6 08/21/2014   PLT 201.0 08/21/2014   GLUCOSE 102* 11/05/2014   CHOL 211* 11/05/2014   TRIG 118.0 11/05/2014   HDL 71.70 11/05/2014   LDLDIRECT 101.4 05/07/2013   LDLCALC 116* 11/05/2014   ALT 28 11/05/2014   AST 30 11/05/2014   NA 139 11/05/2014   K 3.8 11/05/2014   CL 105 11/05/2014   CREATININE 0.71 11/05/2014   BUN 11 11/05/2014   CO2 29 11/05/2014   TSH 1.22 01/16/2014   HGBA1C 6.1 11/05/2014   MICROALBUR 0.5 01/16/2014       Assessment & Plan:   Problem List Items Addressed This Visit    Abnormal liver function test    Follow liver function tests.        Relevant Orders   Hepatic function panel   GERD (gastroesophageal reflux disease)    Controlled on current regimen.  On protonix.        Health care maintenance    Physical  11/08/14.  Mammogram 08/06/14 - Birads II.  Colonoscopy 06/02/11 - diverticulosis.        Hypercholesterolemia    Unable to tolerate statins.  On niacin.  Continue diet and exercise.  Follow lipid panel.       Relevant Orders   Lipid panel   Hyperglycemia    Low carb diet.  Follow met b and a1c.        Relevant Orders   Hemoglobin A1c   Hypertension - Primary    Blood pressure doing well.  Same medication regimen.  Follow pressures.  Follow  metabolic panel.        Relevant Orders   TSH   Basic metabolic panel   Malignant neoplasm of upper-outer quadrant of female breast    On tamoxifen.  Mammogram 08/06/14 - Birads II.         Other Visit Diagnoses    Need for prophylactic vaccination against Streptococcus pneumoniae (pneumococcus)        Relevant Orders    Pneumococcal polysaccharide vaccine 23-valent greater than or equal to 2yo subcutaneous/IM (Completed)      I spent 25 minutes with the patient and more than 50% of the time was spent in consultation regarding the above.     Einar Pheasant, MD

## 2014-11-08 NOTE — Progress Notes (Signed)
Pre visit review using our clinic review tool, if applicable. No additional management support is needed unless otherwise documented below in the visit note. 

## 2014-11-17 ENCOUNTER — Encounter: Payer: Self-pay | Admitting: Internal Medicine

## 2014-11-17 DIAGNOSIS — Z0000 Encounter for general adult medical examination without abnormal findings: Secondary | ICD-10-CM | POA: Insufficient documentation

## 2014-11-17 NOTE — Assessment & Plan Note (Signed)
Follow liver function tests.   

## 2014-11-17 NOTE — Assessment & Plan Note (Signed)
Controlled on current regimen.  On protonix.

## 2014-11-17 NOTE — Assessment & Plan Note (Signed)
Low carb diet.  Follow met b and a1c.  

## 2014-11-17 NOTE — Assessment & Plan Note (Signed)
Blood pressure doing well.  Same medication regimen.  Follow pressures.  Follow metabolic panel.   

## 2014-11-17 NOTE — Assessment & Plan Note (Signed)
On tamoxifen.  Mammogram 08/06/14 - Birads II.

## 2014-11-17 NOTE — Assessment & Plan Note (Signed)
Unable to tolerate statins.  On niacin.  Continue diet and exercise.  Follow lipid panel.

## 2014-11-17 NOTE — Assessment & Plan Note (Signed)
Physical 11/08/14.  Mammogram 08/06/14 - Birads II.  Colonoscopy 06/02/11 - diverticulosis.

## 2014-11-18 ENCOUNTER — Other Ambulatory Visit: Payer: Self-pay | Admitting: *Deleted

## 2014-11-18 MED ORDER — AMLODIPINE BESYLATE 5 MG PO TABS: 5.0000 mg | ORAL_TABLET | Freq: Every day | ORAL | Status: DC

## 2014-11-22 NOTE — Op Note (Signed)
PATIENT NAME:  Helen Marshall, Helen Marshall MR#:  [id number] DATE OF BIRTH:  [birth year]  DATE OF PROCEDURE:  01/22/2013  PREOPERATIVE DIAGNOSES:  1.  Umbilical hernia.  2.  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  POSTOPERATIVE DIAGNOSES: 1.  Umbilical hernia.  2.  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  OPERATIVE PROCEDURE:  1.  Primary umbilical hernia repair.  2.  Left breast wide excision with mastoplasty and sentinel node biopsy.   SURGEON: Hervey Ard, MD.   ANESTHESIA: General by LMA under Dr. Ronelle Nigh, Marcaine 0.5% with 1:200,000 units of epinephrine, 30 mL.   ESTIMATED BLOOD LOSS: Minimal.   CLINICAL NOTE: This 69 year old woman was noted to have an abnormal mammogram and biopsy showed evidence of DCIS. Initial pathology raised a question about possible microinvasion disease and for that reason we elected to complete a sentinel node biopsy at the same time. She had a gradually increasing umbilical hernia and it was elected to repair that at the same setting.   OPERATIVE NOTE: With the patient under general anesthesia by LMA, the abdomen was prepped with ChloraPrep and draped. 10 mL of Marcaine with epinephrine was infiltrated for postoperative analgesia. An infraumbilical incision was made and carried down through the skin and subcutaneous tissue. An old permanent suture was removed from the fascia. The hernia sac was dissected free from the overlying skin and from the fascia, excised and discarded. A 2.5 cm defect was noted. It was elected to complete a primary repair. This was completed using interrupted 0 Surgilon figure-of-eight sutures. The adipose tissue was approximated with 2-0 Vicryl. The skin was closed with running 4-0 Vicryl subcuticular suture. Benzoin and Steri-Strips followed by Telfa and Tegaderm dressing was applied.   Attention was turned to the breast. Prior to hernia repair, 4 mL of 1 to 2 methylene blue/saline was injected in the subareolar plexus. She had previously  undergone injecting with technetium sulfur colloid. Ultrasound was used to confirm the previous biopsy site. Marcaine was infiltrated for postoperative analgesia. A linear incision was made at the 3 o'clock position and carried down through the skin and subcutaneous tissue. Hemostasis was with electrocautery. Wide excision was completed using the electrocautery and this extended down to and included the pectoralis fascia. The specimen was orientated, specimen radiograph confirmed the previous biopsy clip in place and the pathologist reported margins were clear.   While margin report was pending, attention was turned to the axilla. There was an area of increased uptake in the inferior aspect of the axilla. Using the gamma finder, a small transverse incision was made after the instillation of Marcaine with epinephrine. Hemostasis was with electrocautery. A single hot blue lymph node was identified. This was excised with cautery and sent in formalin for permanent section. The wound was closed with 2-0 Vicryl figure-of-eight suture followed by a running 4-0 Vicryl subcuticular suture for the skin.   Attention was turned to completing a mastoplasty of the breast on the possibility the patient would be a candidate for partial breast radiation. The breast was elevated off the underlying pectoralis fascia and then mobilized to the midline of the incision with interrupted 2-0 Vicryl figure-of-eight sutures. The adipose layer was approximated with interrupted 2-0 Vicryl sutures in multiple layers. The skin was closed with running 4-0 Vicryl subcuticular suture. Benzoin and Steri-Strips followed by Telfa and dressing was applied. Fluff gauze, Kerlix and an Ace wrap was then applied. The patient tolerated the procedure well and was taken to the recovery room in stable condition.   ____________________________  Robert Bellow, MD jwb:aw D: 01/22/2013 13:05:03 ET T: 01/22/2013 13:17:46 ET JOB#: [id number]  cc: Robert Bellow, MD, <Dictator> Einar Pheasant, MD Shikha Bibb Amedeo Kinsman MD ELECTRONICALLY SIGNED 01/23/2013 10:40

## 2014-11-22 NOTE — Consult Note (Signed)
Reason for Visit: This 69 year old Female patient presents to the clinic for initial evaluation of  breast cancer .   Referred by Dr. Hervey Ard.  Diagnosis:  Chief Complaint/Diagnosis   69 year old female status post wide local excision for ductal carcinoma in situ stage 0 (Tis N0 M0) ER/PR positive with clear margins  Pathology Report pathology were reviewed   Imaging Report mammograms reviewed   Referral Report clinical notes reviewed   Planned Treatment Regimen adjuvant radiation therapy either accelerated partial breast irradiation versus whole breast   HPI   patient is a 69 year old femalewho presented with a lesion in her left breast that had been noted for several years develop increased in dimensions raising her readings to a BI-RADS for a suspicious abnormality. Underwent biopsy which was positive for ductal carcinoma in situ.tumor was ER/PR positive. She then underwent a wide local excision for 1.2 cm region of ductal carcinoma in situ overall grade 1. There was a sclerosing benign lesion near this which was removed negative for invasive cancer. Sentinel node was also performed and negative. Patient has done well postoperatively. She's been examined by surgeon postoperatively and thought to be a candidate for placement of MammoSite catheter. She seen today for consideration of treatment. She is otherwise doing well specifically denies breast tenderness.  Past Hx:    Diverticulosis:    IBS:    GERD - Esophageal Reflux:    carotid bruit -left:    Heart Murmer:    DCIS Left breast: 2014   htn:    Breast Biopsy:    Colonoscopy:    Tubal Ligation:    Sinus Surgery:    RCR:    Ankle Surgery:    Hysterectomy:    Cholecystectomy:   Past, Family and Social History:  Past Medical History positive   Cardiovascular hyperlipidemia; hypertension   Gastrointestinal diverticulitis; GERD; irritable bowel   Past Surgical History cholecystectomy; rotator cuff  surgery, nasal surgery, tubal ligation, abdominal hysterectomy for fibroids   Past Medical History Comments hyperglycemia, lumbar disc disease, multiple environmental allergies   Family History positive   Family History Comments father with CVA mother with adult onset diabetes sister with breast cancer   Social History noncontributory   Additional Past Medical and Surgical History accompanied by her husband and nurse navigator today   Allergies:   Demerol: N/V  Vicodin: Itching  Latex: Rash  Statins: Unknown  Wheat: Unknown  Corn: Unknown  Nuts: Unknown  Home Meds:  Home Medications: Medication Instructions Status  traMADol 50 mg oral tablet 1 -2  tab(s) orally every 4 hours, As needed, pain Active  Tylenol 325 mg oral tablet 2 tab(s) orally every 4 hours, As Needed - for Pain Active  amLODIPine 5 mg oral tablet 1 tab(s) orally once a day (in the morning) Active  benazepril 40 mg oral tablet 1 tab(s) orally once a day (in the morning) Active  Calcium 600+D 600 mg-200 units oral tablet 1 tab(s) orally once a day Active  Fish Oil 1000 mg oral capsule 5 cap(s) orally once a day Active  Flonase 50 mcg/inh nasal spray 1 spray(s) nasal once a day Active  hydrochlorothiazide 25 mg oral tablet 1 tab(s) orally once a day Active  Lidoderm 5% topical film Apply topically to affected area once a day, As Needed Active  multivitamin 1 tab(s) orally once a day Active  niacin 1000 mg oral tablet, extended release 1 tab(s) orally 2 times a day Active  Protonix 40 mg oral delayed  release tablet 1 tab(s) orally 2 times a day, As Needed Active  potassium chloride 10 mEq oral capsule, extended release 1 cap(s) orally once a day (at bedtime) Active   Review of Systems:  General negative   Performance Status (ECOG) 0   Skin negative   Breast see HPI   Ophthalmologic negative   ENMT negative   Respiratory and Thorax negative   Cardiovascular negative   Gastrointestinal negative    Genitourinary negative   Musculoskeletal negative   Neurological negative   Psychiatric negative   Hematology/Lymphatics negative   Endocrine negative   Allergic/Immunologic negative   Nursing Notes:  Nursing Vital Signs and Chemo Nursing Nursing Notes: *CC Vital Signs Flowsheet:   14-Jul-14 14:33  Temp Temperature 96.6  Pulse Pulse 83  Respirations Respirations 21  SBP SBP 147  DBP DBP 72  Current Weight (kg) (kg) 82.9   Physical Exam:  General/Skin/HEENT:  General normal   Skin normal   Eyes normal   ENMT normal   Head and Neck normal   Additional PE well-developed slightly obese female in NAD. She status post wide local excision of the left breast which is healing well. No dominant mass or nodularity is noted in either breast into position examined. No axillary or supraclavicular adenopathy is appreciated.   Breasts/Resp/CV/GI/GU:  Respiratory and Thorax normal   Cardiovascular normal   Gastrointestinal normal   Genitourinary normal   MS/Neuro/Psych/Lymph:  Musculoskeletal normal   Neurological normal   Lymphatics normal   Other Results:  Radiology Results: LabUnknown:    18-Nov-13 13:39, Digital Diagnostic Mammogram Bilateral  PACS [place]:    15-May-13 08:52, Digital Unilateral Left Breast  Digital Unilateral Left Breast   REASON FOR EXAM:    lf brst asymetric density fu  COMMENTS:       PROCEDURE: MAM - MAM DGTL UNI MAM LT BREAST W/CAD  - Dec 15 2011  8:52AM     RESULT:  The area of asymmetric density described in the superolateral   portion of the left breast appearsmore prominent when compared to   previous studies.  Radiographic evaluation of this region demonstrates no   abnormalities which appear to correlate with this finding. Due to the   increased prominence, surgical consultation is recommended.    IMPRESSION:     1.  Increased conspicuity of the density in the left breast.   2.  BI-RADS: Category 4-  Suspicious Abnormality.   Thank you for the opportunity to contribute to the care of your patient.         A NEGATIVE MAMMOGRAM REPORT DOES NOT PRECLUDEBIOPSY OR OTHER EVALUATION   OF A CLINICALLY PALPABLE OR OTHERWISE SUSPICIOUS MASS OR LESION.  BREAST   CANCER MAY NOT BE DETECTED BY MAMMOGRAPHY IN UP TO 10% OF CASES.           Verified By: Mikki Santee, M.D., MD    [phone] 13:39, Digital Diagnostic Mammogram Bilateral  Digital Diagnostic Mammogram Bilateral   REASON FOR EXAM:    lt br density fu and yrly  COMMENTS:       PROCEDURE: MAM - MAM DGTL DIAGNOSTIC MAMMO W/CAD  - Jun 19 2012  1:39PM     RESULT: Comparison is made to prior studies dated 06/16/2011, 06/10/2010,   03/26/2009 and 12/15/2011.    The breasts demonstrate a moderately dense heterogeneous parenchymal   pattern. Areas of asymmetric density within the left breast are stable.   There is no new  radiographic evidence to suggest malignancy. The finding   within the superolateral portion of concern in the left breast is once   again appreciated and appears stable as described above. This area once   again demonstrates radiographic characteristics which are indeterminate   and to some degree concerning. A six month reevaluation of this region is     recommended. No further radiographic evidence to suggest malignancy is   appreciated.     IMPRESSION:      BI-RADS: Category 3 - Probably Benign Finding (interval follow-up), of   the left breast.     A NEGATIVE MAMMOGRAM REPORT DOES NOT PRECLUDE BIOPSY OR OTHER EVALUATION   OF A CLINICALLY PALPABLE OR OTHERWISE SUSPICIOUS MASS OR LESION. BREAST   CANCER MAY NOT BE DETECTED BY MAMMOGRAPHY IN UP TO 10% OF CASES.    Thank you for the opportunity to contribute to the care of your patient.       Verified By: Mikki Santee, M.D., MD   Relevent Results:   Relevant Scans and Labs mammograms reviewed   Assessment and Plan: Impression:   ductal carcinoma in  situ status post wide local excision of the left breast in 69 year old female ER/PR positive Plan:   at this time I got over treatment recommendations to her left breast. Have discussed options of whole breast radiation versus accelerated partial breast radiation. Patient is leaning towards partial breast radiation. Risks and benefits of both type of radiation and side effects including skin reaction, alteration of blood counts, fatigue, all were explained in detail to the patient. I am referring her back to Dr. Tollie Pizza for placement of the MammoSite catheter. We will plan on delivering 3400 cGy at 340 cGy twice a day using high-dose rate remote afterloading and BrachyVision treatment planning. Should catheter be too close to skin surface her chest wall will go with second option which would be whole breast radiation. Patient will also be a candidate for tamoxifen therapy after completion of radiation.  I would like to take this opportunity to thank you for allowing me to continue to participate in this patient's care.  CC Referral:  cc: Dr. Hervey Ard, Dr. Einar Pheasant   Electronic Signatures: Baruch Gouty, Roda Shutters (MD)  (Signed 14-Jul-14 15:17)  Authored: HPI, Diagnosis, Past Hx, PFSH, Allergies, Home Meds, ROS, Nursing Notes, Physical Exam, Other Results, Relevent Results, Encounter Assessment and Plan, CC Referring Physician   Last Updated: 14-Jul-14 15:17 by Armstead Peaks (MD)

## 2014-11-25 ENCOUNTER — Other Ambulatory Visit: Payer: Self-pay

## 2014-11-25 MED ORDER — PANTOPRAZOLE SODIUM 40 MG PO TBEC: 40.0000 mg | DELAYED_RELEASE_TABLET | Freq: Every day | ORAL | Status: DC

## 2014-12-05 ENCOUNTER — Ambulatory Visit (INDEPENDENT_AMBULATORY_CARE_PROVIDER_SITE_OTHER): Payer: Medicare Other | Admitting: Internal Medicine

## 2014-12-05 ENCOUNTER — Encounter: Payer: Self-pay | Admitting: Internal Medicine

## 2014-12-05 VITALS — BP 132/76 | HR 72 | Ht 63.0 in | Wt 187.0 lb

## 2014-12-05 DIAGNOSIS — R140 Abdominal distension (gaseous): Secondary | ICD-10-CM | POA: Diagnosis not present

## 2014-12-05 DIAGNOSIS — K589 Irritable bowel syndrome without diarrhea: Secondary | ICD-10-CM | POA: Diagnosis not present

## 2014-12-05 MED ORDER — RANITIDINE HCL 150 MG PO TABS: 150.0000 mg | ORAL_TABLET | Freq: Two times a day (BID) | ORAL | Status: DC | PRN

## 2014-12-05 NOTE — Patient Instructions (Signed)
Please discontinue Pantoprazole.  We have sent the following medications to your pharmacy for you to pick up at your convenience: Zantac 150 mg twice daily as needed for heartburn  Please purchase the following medications over the counter and take as directed: VSL #3-take 2 capsules by mouth daily x 1 month, then decrease to 1 capsule daily thereafter.

## 2014-12-05 NOTE — Progress Notes (Signed)
   Subjective:    Patient ID: [name], female    DOB: 1946/03/12, 69 y.o.   MRN: [id number]  HPI Tiersa Dayley is a 69 year old female with past medical history of breast cancer status post excision, IBS, GERD, diverticulosis, hypertension, hyperlipidemia who is seen in follow-up. She was initially seen on 08/21/2014 to evaluate abdominal bloating and epigastric discomfort. After this visit she was advised to take pantoprazole 40 mg on a more regular basis and upper endoscopy was performed on 08/22/2014. This showed a normal esophagus, mild antral gastropathy which was biopsied, and otherwise normal-appearing stomach and normal duodenum. Biopsy showed mild reactive gastropathy in the setting of chronic gastritis. Negative for H. pylori, metaplasia or malignancy. She was treated for bacterial overgrowth with Flagyl and cephalexin. Initially she felt like cephalexin was causing nausea but it was determined to have been metronidazole. She completed therapy with both antibiotics and had improvement and bloating. However shortly after this therapy she developed a viral gastroenteritis and since then has had return of bloating. Dyspeptic symptoms such as epigastric discomfort have improved. She is eating well without nausea or vomiting. She doesn't feel that daily pantoprazole has helped in any way. She did buy IB-guard over-the-counter and has been trying this for the last 2 days. She has intermittently taken probiotic but not consistently. Bowel movements have been fairly regular without blood or melena.  Review of Systems As per history of present illness, otherwise negative  Current Medications, Allergies, Past Medical History, Past Surgical History, Family History and Social History were reviewed in [employer] record.     Objective:   Physical Exam BP 132/76 mmHg  Pulse 72  Ht 5\' 3"  (1.6 m)  Wt 187 lb (84.823 kg)  BMI 33.13 kg/m2  LMP 07/21/1988 Constitutional:  Well-developed and well-nourished. No distress. HEENT: Normocephalic and atraumatic. Conjunctivae are normal.  No scleral icterus. Neck: Neck supple. Trachea midline. Cardiovascular: Normal rate, regular rhythm and intact distal pulses. No M/R/G Pulmonary/chest: Effort normal and breath sounds normal. No wheezing, rales or rhonchi. Abdominal: Soft, obese, nontender, nondistended. Bowel sounds active throughout. Extremities: no clubbing, cyanosis, or edema Neurological: Alert and oriented to person place and time. Psychiatric: Normal mood and affect. Behavior is normal.  EGD with biopsies reviewed TSH normal in June 2015 Celiac panel negative CBC and CMP within normal limits in January 2016 and April 2016 with the exception of mild elevation in serum glucose.    Assessment & Plan:  69 year old female with past medical history of breast cancer status post excision, IBS, GERD, diverticulosis, hypertension, hyperlipidemia who is seen in follow-up. She was initially seen on 08/21/2014 to evaluate abdominal bloating and epigastric discomfort.   1. IBS with gas/bloating -- after evaluation IBS is felt to explain the majority of her upper abdominal symptoms including bloating. Some response to antibiotic therapy but not sustained. She does not feel pantoprazole as helpful and thus we will discontinue it. If dyspeptic symptoms return she can use Zantac 150 milligrams twice a day as needed. I recommended a trial consistent probiotic with VSL #3, 2 capsules daily for 1 month and then 1 capsule daily thereafter. I stressed the importance of using this on a consistent basis to provide benefit. If symptoms worsen or do not improve she is asked to notify me. Otherwise she can be seen on an as-needed basis. She is happy with this plan  2. CRC screening -- normal colonoscopy without polyps 2012, recommended repeat 2022

## 2015-02-19 ENCOUNTER — Other Ambulatory Visit: Payer: Self-pay

## 2015-02-19 MED ORDER — POTASSIUM CHLORIDE ER 10 MEQ PO TBCR: 10.0000 meq | EXTENDED_RELEASE_TABLET | Freq: Two times a day (BID) | ORAL | Status: DC

## 2015-02-19 NOTE — Telephone Encounter (Signed)
ok'd refill kdur #[id number] with no refills.

## 2015-02-19 NOTE — Telephone Encounter (Signed)
Last ov was 11/08/14.  Last labs were 11/05/14 and K was 3.8.  Refilled last in January with 6 months supply.  Please advise?

## 2015-02-28 ENCOUNTER — Other Ambulatory Visit (INDEPENDENT_AMBULATORY_CARE_PROVIDER_SITE_OTHER): Payer: Medicare Other

## 2015-02-28 ENCOUNTER — Encounter: Payer: Self-pay | Admitting: Internal Medicine

## 2015-02-28 DIAGNOSIS — R7989 Other specified abnormal findings of blood chemistry: Secondary | ICD-10-CM

## 2015-02-28 DIAGNOSIS — E7800 Pure hypercholesterolemia, unspecified: Secondary | ICD-10-CM

## 2015-02-28 DIAGNOSIS — R739 Hyperglycemia, unspecified: Secondary | ICD-10-CM

## 2015-02-28 DIAGNOSIS — I10 Essential (primary) hypertension: Secondary | ICD-10-CM

## 2015-02-28 DIAGNOSIS — R945 Abnormal results of liver function studies: Secondary | ICD-10-CM

## 2015-02-28 LAB — BASIC METABOLIC PANEL
BUN: 9 mg/dL (ref 6–23)
CALCIUM: 8.7 mg/dL (ref 8.4–10.5)
CO2: 28 mEq/L (ref 19–32)
CREATININE: 0.65 mg/dL (ref 0.40–1.20)
Chloride: 101 mEq/L (ref 96–112)
GFR: 95.89 mL/min (ref >60.00)
GLUCOSE: 98 mg/dL (ref 70–99)
POTASSIUM: 3.4 meq/L — AB (ref 3.5–5.1)
Sodium: 138 mEq/L (ref 135–145)

## 2015-02-28 LAB — HEPATIC FUNCTION PANEL
ALBUMIN: 3.7 g/dL (ref 3.5–5.2)
ALT: 28 U/L (ref 0–35)
AST: 30 U/L (ref 0–37)
Alkaline Phosphatase: 56 U/L (ref 39–117)
Bilirubin, Direct: 0.1 mg/dL (ref 0.0–0.3)
Total Bilirubin: 0.4 mg/dL (ref 0.2–1.2)
Total Protein: 6 g/dL (ref 6.0–8.3)

## 2015-02-28 LAB — LIPID PANEL
CHOLESTEROL: 151 mg/dL (ref 0–200)
HDL: 68.1 mg/dL (ref >39.00)
LDL CALC: 64 mg/dL (ref 0–99)
NONHDL: 83.07
Total CHOL/HDL Ratio: 2
Triglycerides: 94 mg/dL (ref 0.0–149.0)
VLDL: 18.8 mg/dL (ref 0.0–40.0)

## 2015-02-28 LAB — HEMOGLOBIN A1C: Hgb A1c MFr Bld: 5.8 % (ref 4.6–6.5)

## 2015-02-28 LAB — TSH: TSH: 1.14 u[IU]/mL (ref 0.35–4.50)

## 2015-03-03 ENCOUNTER — Ambulatory Visit (INDEPENDENT_AMBULATORY_CARE_PROVIDER_SITE_OTHER): Payer: Medicare Other | Admitting: Internal Medicine

## 2015-03-03 ENCOUNTER — Encounter: Payer: Self-pay | Admitting: Internal Medicine

## 2015-03-03 VITALS — BP 115/68 | HR 90 | Temp 98.1°F | Ht 63.0 in | Wt 187.0 lb

## 2015-03-03 DIAGNOSIS — I10 Essential (primary) hypertension: Secondary | ICD-10-CM | POA: Diagnosis not present

## 2015-03-03 DIAGNOSIS — E7800 Pure hypercholesterolemia, unspecified: Secondary | ICD-10-CM

## 2015-03-03 DIAGNOSIS — Z0000 Encounter for general adult medical examination without abnormal findings: Secondary | ICD-10-CM

## 2015-03-03 DIAGNOSIS — E876 Hypokalemia: Secondary | ICD-10-CM

## 2015-03-03 DIAGNOSIS — M519 Unspecified thoracic, thoracolumbar and lumbosacral intervertebral disc disorder: Secondary | ICD-10-CM | POA: Diagnosis not present

## 2015-03-03 DIAGNOSIS — R7989 Other specified abnormal findings of blood chemistry: Secondary | ICD-10-CM

## 2015-03-03 DIAGNOSIS — K219 Gastro-esophageal reflux disease without esophagitis: Secondary | ICD-10-CM | POA: Diagnosis not present

## 2015-03-03 DIAGNOSIS — R739 Hyperglycemia, unspecified: Secondary | ICD-10-CM

## 2015-03-03 DIAGNOSIS — C50419 Malignant neoplasm of upper-outer quadrant of unspecified female breast: Secondary | ICD-10-CM

## 2015-03-03 DIAGNOSIS — R945 Abnormal results of liver function studies: Secondary | ICD-10-CM

## 2015-03-03 NOTE — Progress Notes (Signed)
Patient ID: Helen Marshall, female   DOB: 1945-08-17, 69 y.o.   MRN: [id number]   Subjective:    Patient ID: Helen Marshall, female    DOB: [birth year], 69 y.o.   MRN: [id number]  HPI  Patient here for a scheduled follow up.  Saw Dr Hilarie Fredrickson.  See his note for details.  Started on a different probiotic.  This has helped.  No abdominal pain.  Tries to stay active.  No cardiac symptoms with increased activity or exertion.  No sob.  Eating and drinking well.  Taking one potassium per day.  Some low back discomfort and some pain down her leg.  Has seen ortho and has been to therapy.  Will flare intermittently.  No significant problems.  Desires no further intervention.    Past Medical History  Diagnosis Date  . Hypertension   . Hypercholesterolemia   . IBS (irritable bowel syndrome)   . GERD (gastroesophageal reflux disease)   . Carotid bruit     left  . Diverticulosis   . Hyperglycemia   . Lumbar disc disease   . Environmental allergies   . Heart murmur   . Diverticulosis 2012  . Abnormal mammogram 12/19/2012    left  . Cancer     Breast  . 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  . Lipoma of colon   . Colon polyps   . Allergy   . Cataract     Outpatient Encounter Prescriptions as of 03/03/2015  Medication Sig  . amLODipine (NORVASC) 5 MG tablet Take 1 tablet (5 mg total) by mouth daily.  . benazepril (LOTENSIN) 40 MG tablet Take 1 tablet (40 mg total) by mouth daily.  . calcium-vitamin D (CALCIUM 500+D) 500-400 MG-UNIT per tablet Take 1 tablet by mouth daily.   . fish oil-omega-3 fatty acids 1000 MG capsule 5 (five) times daily.   . fluticasone (FLONASE) 50 MCG/ACT nasal spray Place 2 sprays into both nostrils as needed.  . GuaiFENesin (MUCINEX PO) Take 1 tablet by mouth as needed.  . hydrochlorothiazide (HYDRODIURIL) 25 MG tablet Take  1 tablet (25 mg total) by mouth daily.  . Multiple Vitamin (MULTIVITAMIN) tablet Take 1 tablet by mouth daily.  . Multiple Vitamins-Minerals (EYE VITAMINS PO) Take by mouth.  . niacin (NIASPAN) 1000 MG CR tablet Take 1,000 mg by mouth 2 (two) times daily.  . potassium chloride (K-DUR) 10 MEQ tablet Take 1 tablet (10 mEq total) by mouth 2 (two) times daily.  . ranitidine (ZANTAC) 150 MG tablet Take 1 tablet (150 mg total) by mouth 2 (two) times daily as needed for heartburn.  . tamoxifen (NOLVADEX) 20 MG tablet Take 1 tablet (20 mg total) by mouth daily.  Marland Kitchen triamcinolone cream (KENALOG) 0.1 % Apply 1 application topically as needed.    No facility-administered encounter medications on file as of 03/03/2015.    Review of Systems  Constitutional: Negative for appetite change and unexpected weight change.  HENT: Negative for congestion and sinus pressure.   Respiratory: Negative for cough, chest tightness and shortness of breath.   Cardiovascular: Negative for chest pain, palpitations and leg swelling.  Gastrointestinal: Negative for nausea, vomiting and abdominal pain.       Bowels better.    Genitourinary: Negative for dysuria and difficulty urinating.  Musculoskeletal: Negative for back pain and joint swelling.  Skin: Negative  for color change and rash.  Neurological: Negative for dizziness, light-headedness and headaches.  Psychiatric/Behavioral: Negative for dysphoric mood and agitation.       Objective:    Physical Exam  Constitutional: She appears well-developed and well-nourished. No distress.  HENT:  Nose: Nose normal.  Mouth/Throat: Oropharynx is clear and moist.  Neck: Neck supple. No thyromegaly present.  Cardiovascular: Normal rate and regular rhythm.   Pulmonary/Chest: Breath sounds normal. No respiratory distress. She has no wheezes.  Abdominal: Soft. Bowel sounds are normal. There is no tenderness.  Musculoskeletal: She exhibits no edema or tenderness.    Lymphadenopathy:    She has no cervical adenopathy.  Skin: No rash noted. No erythema.  Psychiatric: She has a normal mood and affect. Her behavior is normal.    BP 115/68 mmHg  Pulse 90  Temp(Src) 98.1 F (36.7 C) (Oral)  Ht _0  (1.6 m)  Wt 187 lb (84.823 kg)  BMI 33.13 kg/m2  SpO2 96%  LMP 07/21/1988 Wt Readings from Last 3 Encounters:  03/03/15 187 lb (84.823 kg)  12/05/14 187 lb (84.823 kg)  11/08/14 188 lb 2 oz (85.333 kg)     Lab Results  Component Value Date   WBC 6.6 08/21/2014   HGB 14.0 08/21/2014   HCT 40.6 08/21/2014   PLT 201.0 08/21/2014   GLUCOSE 98 02/28/2015   CHOL 151 02/28/2015   TRIG 94.0 02/28/2015   HDL 68.10 02/28/2015   LDLDIRECT 101.4 05/07/2013   LDLCALC 64 02/28/2015   ALT 28 02/28/2015   AST 30 02/28/2015   NA 138 02/28/2015   K 3.4* 03/03/2015   CL 101 02/28/2015   CREATININE 0.65 02/28/2015   BUN 9 02/28/2015   CO2 28 02/28/2015   TSH 1.14 02/28/2015   HGBA1C 5.8 02/28/2015   MICROALBUR 0.5 01/16/2014       Assessment & Plan:   Problem List Items Addressed This Visit    Abnormal liver function test    Recheck check wnl.  Follow.       GERD (gastroesophageal reflux disease)    Upper symptoms controlled.        Health care maintenance    Physical 11/08/14.  Mammogram 08/06/14 - Birads II.  Colonoscopy 06/02/11 - diverticulosis.       Hypercholesterolemia    Cholesterol doing well.  Continue low cholesterol diet and exercise.   Lab Results  Component Value Date   CHOL 151 02/28/2015   HDL 68.10 02/28/2015   LDLCALC 64 02/28/2015   LDLDIRECT 101.4 05/07/2013   TRIG 94.0 02/28/2015   CHOLHDL 2 02/28/2015        Hyperglycemia    Low carb diet and exercise.  Follow met b and a1c.       Hypertension    Blood pressure under good control.  Continue same medication regimen.  Follow pressures.  Follow metabolic panel.        Lumbar disc disease    Has been evaluated by ortho.  Had declined epidural.  Has had  therapy.  lidoderm patches has helped.  Desires no further intervention.       Malignant neoplasm of upper-outer quadrant of female breast    On tamoxifen.  Mammogram 08/06/14 - Birads II.         Other Visit Diagnoses    Hypokalemia    -  Primary    Relevant Orders    Potassium (Completed)        Einar Pheasant, MD

## 2015-03-03 NOTE — Progress Notes (Signed)
Pre visit review using our clinic review tool, if applicable. No additional management support is needed unless otherwise documented below in the visit note. 

## 2015-03-04 LAB — POTASSIUM: POTASSIUM: 3.4 meq/L — AB (ref 3.5–5.1)

## 2015-03-05 ENCOUNTER — Encounter: Payer: Self-pay | Admitting: Internal Medicine

## 2015-03-05 ENCOUNTER — Other Ambulatory Visit: Payer: Self-pay | Admitting: Internal Medicine

## 2015-03-05 DIAGNOSIS — E876 Hypokalemia: Secondary | ICD-10-CM

## 2015-03-05 NOTE — Assessment & Plan Note (Signed)
Cholesterol doing well.  Continue low cholesterol diet and exercise.   Lab Results  Component Value Date   CHOL 151 02/28/2015   HDL 68.10 02/28/2015   LDLCALC 64 02/28/2015   LDLDIRECT 101.4 05/07/2013   TRIG 94.0 02/28/2015   CHOLHDL 2 02/28/2015

## 2015-03-05 NOTE — Assessment & Plan Note (Signed)
Blood pressure under good control.  Continue same medication regimen.  Follow pressures.  Follow metabolic panel.   

## 2015-03-05 NOTE — Assessment & Plan Note (Signed)
Low carb diet and exercise.  Follow met b and a1c.  

## 2015-03-05 NOTE — Assessment & Plan Note (Signed)
On tamoxifen.  Mammogram 08/06/14 - Birads II.

## 2015-03-05 NOTE — Assessment & Plan Note (Signed)
Physical 11/08/14.  Mammogram 08/06/14 - Birads II.  Colonoscopy 06/02/11 - diverticulosis.

## 2015-03-05 NOTE — Assessment & Plan Note (Signed)
Upper symptoms controlled.  

## 2015-03-05 NOTE — Progress Notes (Signed)
Order placed for f/u potassium check.  

## 2015-03-05 NOTE — Assessment & Plan Note (Signed)
Has been evaluated by ortho.  Had declined epidural.  Has had therapy.  lidoderm patches has helped.  Desires no further intervention.

## 2015-03-05 NOTE — Assessment & Plan Note (Signed)
Recheck check wnl.  Follow.

## 2015-03-25 ENCOUNTER — Other Ambulatory Visit: Payer: Self-pay | Admitting: Surgical

## 2015-03-25 MED ORDER — BENAZEPRIL HCL 40 MG PO TABS: 40.0000 mg | ORAL_TABLET | Freq: Every day | ORAL | Status: DC

## 2015-03-25 NOTE — Telephone Encounter (Signed)
RX for Benazepril sent to pharmacy

## 2015-03-26 ENCOUNTER — Other Ambulatory Visit (INDEPENDENT_AMBULATORY_CARE_PROVIDER_SITE_OTHER): Payer: Medicare Other

## 2015-03-26 DIAGNOSIS — E876 Hypokalemia: Secondary | ICD-10-CM

## 2015-03-26 LAB — POTASSIUM: Potassium: 3.5 meq/L (ref 3.5–5.1)

## 2015-03-27 ENCOUNTER — Encounter: Payer: Self-pay | Admitting: Internal Medicine

## 2015-04-29 ENCOUNTER — Other Ambulatory Visit: Payer: Self-pay

## 2015-04-29 MED ORDER — TAMOXIFEN CITRATE 20 MG PO TABS: 20.0000 mg | ORAL_TABLET | Freq: Every day | ORAL | Status: DC

## 2015-04-29 NOTE — Progress Notes (Signed)
Patient states that she is completely out of her Tamoxifen and needs this reordered. She would like a 90 day supple called in. Tamoxifen refilled # 90 with 3 refills.

## 2015-05-12 ENCOUNTER — Other Ambulatory Visit: Payer: Self-pay

## 2015-05-12 MED ORDER — AMLODIPINE BESYLATE 5 MG PO TABS: 5.0000 mg | ORAL_TABLET | Freq: Every day | ORAL | Status: DC

## 2015-05-23 ENCOUNTER — Other Ambulatory Visit: Payer: Self-pay | Admitting: *Deleted

## 2015-05-23 MED ORDER — HYDROCHLOROTHIAZIDE 25 MG PO TABS: 25.0000 mg | ORAL_TABLET | Freq: Every day | ORAL | Status: DC

## 2015-06-25 ENCOUNTER — Encounter: Payer: Self-pay | Admitting: *Deleted

## 2015-07-01 ENCOUNTER — Other Ambulatory Visit (INDEPENDENT_AMBULATORY_CARE_PROVIDER_SITE_OTHER): Payer: Medicare Other

## 2015-07-01 ENCOUNTER — Telehealth: Payer: Self-pay | Admitting: *Deleted

## 2015-07-01 DIAGNOSIS — R945 Abnormal results of liver function studies: Secondary | ICD-10-CM

## 2015-07-01 DIAGNOSIS — R7989 Other specified abnormal findings of blood chemistry: Secondary | ICD-10-CM

## 2015-07-01 DIAGNOSIS — R739 Hyperglycemia, unspecified: Secondary | ICD-10-CM | POA: Diagnosis not present

## 2015-07-01 DIAGNOSIS — I10 Essential (primary) hypertension: Secondary | ICD-10-CM | POA: Diagnosis not present

## 2015-07-01 DIAGNOSIS — E7800 Pure hypercholesterolemia, unspecified: Secondary | ICD-10-CM

## 2015-07-01 LAB — LIPID PANEL
CHOLESTEROL: 193 mg/dL (ref 0–200)
HDL: 74.4 mg/dL (ref >39.00)
LDL CALC: 97 mg/dL (ref 0–99)
NonHDL: 118.72
TRIGLYCERIDES: 111 mg/dL (ref 0.0–149.0)
Total CHOL/HDL Ratio: 3
VLDL: 22.2 mg/dL (ref 0.0–40.0)

## 2015-07-01 LAB — HEPATIC FUNCTION PANEL
ALBUMIN: 3.8 g/dL (ref 3.5–5.2)
ALT: 20 U/L (ref 0–35)
AST: 27 U/L (ref 0–37)
Alkaline Phosphatase: 47 U/L (ref 39–117)
BILIRUBIN TOTAL: 0.3 mg/dL (ref 0.2–1.2)
Bilirubin, Direct: 0 mg/dL (ref 0.0–0.3)
TOTAL PROTEIN: 6.4 g/dL (ref 6.0–8.3)

## 2015-07-01 LAB — BASIC METABOLIC PANEL
BUN: 12 mg/dL (ref 6–23)
CALCIUM: 9.1 mg/dL (ref 8.4–10.5)
CO2: 28 mEq/L (ref 19–32)
Chloride: 103 mEq/L (ref 96–112)
Creatinine, Ser: 0.74 mg/dL (ref 0.40–1.20)
GFR: 82.48 mL/min (ref >60.00)
Glucose, Bld: 102 mg/dL — ABNORMAL HIGH (ref 70–99)
Potassium: 3.6 mEq/L (ref 3.5–5.1)
SODIUM: 139 meq/L (ref 135–145)

## 2015-07-01 LAB — HEMOGLOBIN A1C: HEMOGLOBIN A1C: 6 % (ref 4.6–6.5)

## 2015-07-01 NOTE — Telephone Encounter (Signed)
Orders placed for labs

## 2015-07-01 NOTE — Telephone Encounter (Signed)
Labs and dx?  

## 2015-07-02 ENCOUNTER — Encounter: Payer: Self-pay | Admitting: Internal Medicine

## 2015-07-03 ENCOUNTER — Encounter: Payer: Self-pay | Admitting: Internal Medicine

## 2015-07-03 ENCOUNTER — Ambulatory Visit (INDEPENDENT_AMBULATORY_CARE_PROVIDER_SITE_OTHER): Payer: Medicare Other | Admitting: Internal Medicine

## 2015-07-03 VITALS — BP 120/70 | HR 84 | Temp 97.9°F | Resp 18 | Ht 63.0 in | Wt 184.5 lb

## 2015-07-03 DIAGNOSIS — D0512 Intraductal carcinoma in situ of left breast: Secondary | ICD-10-CM

## 2015-07-03 DIAGNOSIS — K219 Gastro-esophageal reflux disease without esophagitis: Secondary | ICD-10-CM | POA: Diagnosis not present

## 2015-07-03 DIAGNOSIS — Z23 Encounter for immunization: Secondary | ICD-10-CM

## 2015-07-03 DIAGNOSIS — E7800 Pure hypercholesterolemia, unspecified: Secondary | ICD-10-CM

## 2015-07-03 DIAGNOSIS — I10 Essential (primary) hypertension: Secondary | ICD-10-CM | POA: Diagnosis not present

## 2015-07-03 DIAGNOSIS — R7989 Other specified abnormal findings of blood chemistry: Secondary | ICD-10-CM

## 2015-07-03 DIAGNOSIS — R0989 Other specified symptoms and signs involving the circulatory and respiratory systems: Secondary | ICD-10-CM

## 2015-07-03 DIAGNOSIS — R945 Abnormal results of liver function studies: Secondary | ICD-10-CM

## 2015-07-03 DIAGNOSIS — R739 Hyperglycemia, unspecified: Secondary | ICD-10-CM

## 2015-07-03 DIAGNOSIS — K5730 Diverticulosis of large intestine without perforation or abscess without bleeding: Secondary | ICD-10-CM

## 2015-07-03 NOTE — Progress Notes (Signed)
Patient ID: Helen Marshall, female   DOB: 1945/11/22, 69 y.o.   MRN: [id number]   Subjective:    Patient ID: Helen Marshall, female    DOB: 08-Nov-1945, 69 y.o.   MRN: [id number]  HPI  Patient with past history of hypercholesterolemia, GERD, hypertension, hyperglycemia and GERD.  She comes in today to follow up on these issues.  She reports that she is doing well.  No acid reflux.  States her stomach is better.  Bowels better.  Still some intermittent flares.  Better.  Tries to stay active.  No cardiac symptoms with increased activity or exertion.  No sob.  Sugars doing well.  Discussed diet and exercise.     Past Medical History  Diagnosis Date  . Hypertension   . Hypercholesterolemia   . IBS (irritable bowel syndrome)   . GERD (gastroesophageal reflux disease)   . Carotid bruit     left  . Diverticulosis   . Hyperglycemia   . Lumbar disc disease   . Environmental allergies   . Heart murmur   . Diverticulosis 2012  . Abnormal mammogram 12/19/2012    left  . Cancer ([place])     Breast  . 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  . Lipoma of colon   . Colon polyps   . Allergy   . Cataract    Past Surgical History  Procedure Laterality Date  . Cholecystectomy  2001  . Rotator cuff surgery  1998  . Nasal sinus surgery  1997  . Tubal ligation  1976  . Abdominal hysterectomy  1989    fibroids  . Colonoscopy  06/02/2011    Verdie Shire, MD; diverticulosis, submucosal lipoma of the sigmoid colon.  . Ankle surgery Left 1999  . 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 Breast surgery Left 1970    biopsy  . Hernia repair  2001, 2004  . Upper gi endoscopy  08/22/14    Dr Billie Lade   Family History  Problem Relation Age of Onset  . CVA Father   . Alcohol abuse Father   . Heart disease Mother     myocardial  infarction  . Diabetes Mother   . Hypertension Brother     x2  . Alcohol abuse Brother   . Arthritis/Rheumatoid Sister     x2  . Breast cancer Sister 76  . Osteoarthritis Sister   . Asthma Sister   . Hypothyroidism Sister   . Cancer Other     breast - paternal first cousin  . Colon cancer Maternal Uncle   . Colon polyps Son   . Colon polyps Sister    Social History   Social History  . Marital Status: Married    Spouse Name: N/A  . Number of Children: 2  . Years of Education: N/A   Occupational History  . Retired    Social History Main Topics  . Smoking status: Never Smoker   . Smokeless tobacco: Never Used  . Alcohol Use: No  . Drug Use: No  . Sexual Activity: Not Asked   Other Topics Concern  . None   Social History Narrative    Outpatient Encounter Prescriptions as of 07/03/2015  Medication Sig  . amLODipine (NORVASC) 5 MG tablet Take 1 tablet (5 mg total) by mouth daily.  Marland Kitchen  benazepril (LOTENSIN) 40 MG tablet Take 1 tablet (40 mg total) by mouth daily.  . calcium-vitamin D (CALCIUM 500+D) 500-400 MG-UNIT per tablet Take 1 tablet by mouth daily.   . fish oil-omega-3 fatty acids 1000 MG capsule 5 (five) times daily.   . fluticasone (FLONASE) 50 MCG/ACT nasal spray Place 2 sprays into both nostrils as needed.  . GuaiFENesin (MUCINEX PO) Take 1 tablet by mouth as needed.  . hydrochlorothiazide (HYDRODIURIL) 25 MG tablet Take 1 tablet (25 mg total) by mouth daily.  . Multiple Vitamin (MULTIVITAMIN) tablet Take 1 tablet by mouth daily.  . Multiple Vitamins-Minerals (EYE VITAMINS PO) Take by mouth.  . niacin (NIASPAN) 1000 MG CR tablet Take 1,000 mg by mouth 2 (two) times daily.  . potassium chloride (K-DUR) 10 MEQ tablet Take 1 tablet (10 mEq total) by mouth 2 (two) times daily.  . ranitidine (ZANTAC) 150 MG tablet Take 1 tablet (150 mg total) by mouth 2 (two) times daily as needed for heartburn.  . tamoxifen (NOLVADEX) 20 MG tablet Take 1 tablet (20 mg total) by  mouth daily.  Marland Kitchen triamcinolone cream (KENALOG) 0.1 % Apply 1 application topically as needed.    No facility-administered encounter medications on file as of 07/03/2015.    Review of Systems  Constitutional: Negative for appetite change and unexpected weight change.  HENT: Negative for congestion and sinus pressure.   Eyes: Negative for pain and itching.  Respiratory: Negative for cough, chest tightness and shortness of breath.   Cardiovascular: Negative for chest pain, palpitations and leg swelling.  Gastrointestinal: Negative for nausea, vomiting and abdominal pain.       Bowels overall better.  Still with flares, but overall better.  Has seen GI.   Genitourinary: Negative for dysuria and difficulty urinating.  Musculoskeletal: Negative for back pain and joint swelling.  Skin: Negative for color change and rash.  Neurological: Negative for dizziness, light-headedness and headaches.  Psychiatric/Behavioral: Negative for dysphoric mood and agitation.       Objective:    Physical Exam  Constitutional: She appears well-developed and well-nourished. No distress.  HENT:  Nose: Nose normal.  Mouth/Throat: Oropharynx is clear and moist.  Eyes: Conjunctivae are normal. Right eye exhibits no discharge. Left eye exhibits no discharge.  Neck: Neck supple. No thyromegaly present.  Cardiovascular: Normal rate and regular rhythm.   Pulmonary/Chest: Breath sounds normal. No respiratory distress. She has no wheezes.  Abdominal: Soft. Bowel sounds are normal. There is no tenderness.  Musculoskeletal: She exhibits no edema or tenderness.  Lymphadenopathy:    She has no cervical adenopathy.  Skin: No rash noted. No erythema.  Psychiatric: She has a normal mood and affect. Her behavior is normal.    BP 120/70 mmHg  Pulse 84  Temp(Src) 97.9 F (36.6 C) (Oral)  Resp 18  Ht _0  (1.6 m)  Wt 184 lb 8 oz (83.689 kg)  BMI 32.69 kg/m2  SpO2 96%  LMP 07/21/1988 Wt Readings from Last 3  Encounters:  07/03/15 184 lb 8 oz (83.689 kg)  03/03/15 187 lb (84.823 kg)  12/05/14 187 lb (84.823 kg)     Lab Results  Component Value Date   WBC 6.6 08/21/2014   HGB 14.0 08/21/2014   HCT 40.6 08/21/2014   PLT 201.0 08/21/2014   GLUCOSE 102* 07/01/2015   CHOL 193 07/01/2015   TRIG 111.0 07/01/2015   HDL 74.40 07/01/2015   LDLDIRECT 101.4 05/07/2013   LDLCALC 97 07/01/2015   ALT 20 07/01/2015  AST 27 07/01/2015   NA 139 07/01/2015   K 3.6 07/01/2015   CL 103 07/01/2015   CREATININE 0.74 07/01/2015   BUN 12 07/01/2015   CO2 28 07/01/2015   TSH 1.14 02/28/2015   HGBA1C 6.0 07/01/2015   MICROALBUR 0.5 01/16/2014       Assessment & Plan:   Problem List Items Addressed This Visit    Abnormal liver function test    Recheck wnl.  Follow liver panel.        Relevant Orders   Hepatic function panel   Diverticulosis    Bowels stable.  Colonoscopy 06/02/11 - diverticulosis.  Better.        GERD (gastroesophageal reflux disease)    Upper symptoms controlled.  On zantac.        Hypercholesterolemia    Low cholesterol diet and exercise.  Follow lipid panel.   LDL just checked - 97.        Relevant Orders   Lipid panel   Hyperglycemia    Low carb diet and exercise.  Follow met b and a1c. a1c 07/01/15 - 6.0.        Relevant Orders   Hemoglobin A1c   Hypertension    Blood pressure under good control.  Continue same medication regimen.  Follow pressures.  Follow metabolic panel.        Relevant Orders   CBC with Differential/Platelet   Basic metabolic panel   Left carotid bruit    Asymptomatic.  Continue daily aspirin.        Neoplasm of left breast, primary tumor 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)    On tamoxifen and doing well.  Mammogram 08/06/14 - Birads II.  Sees Dr Bary Castilla.         Other Visit Diagnoses    Encounter for immunization    -  Primary        Einar Pheasant, MD

## 2015-07-03 NOTE — Progress Notes (Signed)
Pre-visit discussion using our clinic review tool. No additional management support is needed unless otherwise documented below in the visit note.  

## 2015-07-03 NOTE — Patient Instructions (Signed)

## 2015-07-06 ENCOUNTER — Encounter: Payer: Self-pay | Admitting: Internal Medicine

## 2015-07-06 NOTE — Assessment & Plan Note (Signed)
Asymptomatic.  Continue daily aspirin

## 2015-07-06 NOTE — Assessment & Plan Note (Addendum)
Low cholesterol diet and exercise.  Follow lipid panel.  LDL just checked 97.   

## 2015-07-06 NOTE — Assessment & Plan Note (Signed)
Blood pressure under good control.  Continue same medication regimen.  Follow pressures.  Follow metabolic panel.   

## 2015-07-06 NOTE — Assessment & Plan Note (Signed)
Upper symptoms controlled.  On zantac.

## 2015-07-06 NOTE — Assessment & Plan Note (Signed)
Recheck wnl.  Follow liver panel.

## 2015-07-06 NOTE — Assessment & Plan Note (Signed)
Bowels stable.  Colonoscopy 06/02/11 - diverticulosis.  Better.

## 2015-07-06 NOTE — Assessment & Plan Note (Addendum)
Low carb diet and exercise.  Follow met b and a1c. a1c 07/01/15 - 6.0.

## 2015-07-06 NOTE — Assessment & Plan Note (Signed)
On tamoxifen and doing well.  Mammogram 08/06/14 - Birads II.  Sees Dr Bary Castilla.

## 2015-07-14 ENCOUNTER — Encounter: Payer: Self-pay | Admitting: Internal Medicine

## 2015-07-15 ENCOUNTER — Encounter: Payer: Self-pay | Admitting: Internal Medicine

## 2015-07-17 ENCOUNTER — Ambulatory Visit (INDEPENDENT_AMBULATORY_CARE_PROVIDER_SITE_OTHER): Payer: Medicare Other

## 2015-07-17 VITALS — BP 122/68 | HR 83 | Temp 98.0°F | Resp 14 | Ht 62.5 in | Wt 183.1 lb

## 2015-07-17 DIAGNOSIS — Z0000 Encounter for general adult medical examination without abnormal findings: Secondary | ICD-10-CM

## 2015-07-17 DIAGNOSIS — Z1159 Encounter for screening for other viral diseases: Secondary | ICD-10-CM

## 2015-07-17 NOTE — Patient Instructions (Addendum)
Helen Marshall,  Thank you for taking time to come for your Medicare Wellness Visit.  I appreciate your ongoing commitment to your health goals. Please review the following plan we discussed and let me know if I can assist you in the future.  Return in April for labs and scheduled appointment with Dr. Nicki Reaper.  Stretch!  Happy Holidays!  Health Maintenance, Female Adopting a healthy lifestyle and getting preventive care can go a long way to promote health and wellness. Talk with your health care provider about what schedule of regular examinations is right for you. This is a good chance for you to check in with your provider about disease prevention and staying healthy. In between checkups, there are plenty of things you can do on your own. Experts have done a lot of research about which lifestyle changes and preventive measures are most likely to keep you healthy. Ask your health care provider for more information. WEIGHT AND DIET  Eat a healthy diet  Be sure to include plenty of vegetables, fruits, low-fat dairy products, and lean protein.  Do not eat a lot of foods high in solid fats, added sugars, or salt.  Get regular exercise. This is one of the most important things you can do for your health.  Most adults should exercise for at least 150 minutes each week. The exercise should increase your heart rate and make you sweat (moderate-intensity exercise).  Most adults should also do strengthening exercises at least twice a week. This is in addition to the moderate-intensity exercise.  Maintain a healthy weight  Body mass index (BMI) is a measurement that can be used to identify possible weight problems. It estimates body fat based on height and weight. Your health care provider can help determine your BMI and help you achieve or maintain a healthy weight.  For females 16 years of age and older:   A BMI below 18.5 is considered underweight.  A BMI of 18.5 to 24.9 is normal.  A BMI of  25 to 29.9 is considered overweight.  A BMI of 30 and above is considered obese.  Watch levels of cholesterol and blood lipids  You should start having your blood tested for lipids and cholesterol at 70 years of age, then have this test every 5 years.  You may need to have your cholesterol levels checked more often if:  Your lipid or cholesterol levels are high.  You are older than 70 years of age.  You are at high risk for heart disease.  CANCER SCREENING   Lung Cancer  Lung cancer screening is recommended for adults 72-53 years old who are at high risk for lung cancer because of a history of smoking.  A yearly low-dose CT scan of the lungs is recommended for people who:  Currently smoke.  Have quit within the past 15 years.  Have at least a 30-pack-year history of smoking. A pack year is smoking an average of one pack of cigarettes a day for 1 year.  Yearly screening should continue until it has been 15 years since you quit.  Yearly screening should stop if you develop a health problem that would prevent you from having lung cancer treatment.  Breast Cancer  Practice breast self-awareness. This means understanding how your breasts normally appear and feel.  It also means doing regular breast self-exams. Let your health care provider know about any changes, no matter how small.  If you are in your 20s or 30s, you should have a  clinical breast exam (CBE) by a health care provider every 1-3 years as part of a regular health exam.  If you are 48 or older, have a CBE every year. Also consider having a breast X-ray (mammogram) every year.  If you have a family history of breast cancer, talk to your health care provider about genetic screening.  If you are at high risk for breast cancer, talk to your health care provider about having an MRI and a mammogram every year.  Breast cancer gene (BRCA) assessment is recommended for women who have family members with BRCA-related  cancers. BRCA-related cancers include:  Breast.  Ovarian.  Tubal.  Peritoneal cancers.  Results of the assessment will determine the need for genetic counseling and BRCA1 and BRCA2 testing. Cervical Cancer Your health care provider may recommend that you be screened regularly for cancer of the pelvic organs (ovaries, uterus, and vagina). This screening involves a pelvic examination, including checking for microscopic changes to the surface of your cervix (Pap test). You may be encouraged to have this screening done every 3 years, beginning at age 70.  For women ages 32-65, health care providers may recommend pelvic exams and Pap testing every 3 years, or they may recommend the Pap and pelvic exam, combined with testing for human papilloma virus (HPV), every 5 years. Some types of HPV increase your risk of cervical cancer. Testing for HPV may also be done on women of any age with unclear Pap test results.  Other health care providers may not recommend any screening for nonpregnant women who are considered low risk for pelvic cancer and who do not have symptoms. Ask your health care provider if a screening pelvic exam is right for you.  If you have had past treatment for cervical cancer or a condition that could lead to cancer, you need Pap tests and screening for cancer for at least 20 years after your treatment. If Pap tests have been discontinued, your risk factors (such as having a new sexual partner) need to be reassessed to determine if screening should resume. Some women have medical problems that increase the chance of getting cervical cancer. In these cases, your health care provider may recommend more frequent screening and Pap tests. Colorectal Cancer  This type of cancer can be detected and often prevented.  Routine colorectal cancer screening usually begins at 70 years of age and continues through 70 years of age.  Your health care provider may recommend screening at an earlier  age if you have risk factors for colon cancer.  Your health care provider may also recommend using home test kits to check for hidden blood in the stool.  A small camera at the end of a tube can be used to examine your colon directly (sigmoidoscopy or colonoscopy). This is done to check for the earliest forms of colorectal cancer.  Routine screening usually begins at age 95.  Direct examination of the colon should be repeated every 5-10 years through 70 years of age. However, you may need to be screened more often if early forms of precancerous polyps or small growths are found. Skin Cancer  Check your skin from head to toe regularly.  Tell your health care provider about any new moles or changes in moles, especially if there is a change in a mole's shape or color.  Also tell your health care provider if you have a mole that is larger than the size of a pencil eraser.  Always use sunscreen. Apply sunscreen liberally  and repeatedly throughout the day.  Protect yourself by wearing long sleeves, pants, a wide-brimmed hat, and sunglasses whenever you are outside. HEART DISEASE, DIABETES, AND HIGH BLOOD PRESSURE   High blood pressure causes heart disease and increases the risk of stroke. High blood pressure is more likely to develop in:  People who have blood pressure in the high end of the normal range (130-139/85-89 mm Hg).  People who are overweight or obese.  People who are African American.  If you are 61-53 years of age, have your blood pressure checked every 3-5 years. If you are 40 years of age or older, have your blood pressure checked every year. You should have your blood pressure measured twice--once when you are at a hospital or clinic, and once when you are not at a hospital or clinic. Record the average of the two measurements. To check your blood pressure when you are not at a hospital or clinic, you can use:  An automated blood pressure machine at a pharmacy.  A home  blood pressure monitor.  If you are between 65 years and 28 years old, ask your health care provider if you should take aspirin to prevent strokes.  Have regular diabetes screenings. This involves taking a blood sample to check your fasting blood sugar level.  If you are at a normal weight and have a low risk for diabetes, have this test once every three years after 70 years of age.  If you are overweight and have a high risk for diabetes, consider being tested at a younger age or more often. PREVENTING INFECTION  Hepatitis B  If you have a higher risk for hepatitis B, you should be screened for this virus. You are considered at high risk for hepatitis B if:  You were born in a country where hepatitis B is common. Ask your health care provider which countries are considered high risk.  Your parents were born in a high-risk country, and you have not been immunized against hepatitis B (hepatitis B vaccine).  You have HIV or AIDS.  You use needles to inject street drugs.  You live with someone who has hepatitis B.  You have had sex with someone who has hepatitis B.  You get hemodialysis treatment.  You take certain medicines for conditions, including cancer, organ transplantation, and autoimmune conditions. Hepatitis C  Blood testing is recommended for:  Everyone born from [birth year] through 1966.  Anyone with known risk factors for hepatitis C. Sexually transmitted infections (STIs)  You should be screened for sexually transmitted infections (STIs) including gonorrhea and chlamydia if:  You are sexually active and are younger than 70 years of age.  You are older than 70 years of age and your health care provider tells you that you are at risk for this type of infection.  Your sexual activity has changed since you were last screened and you are at an increased risk for chlamydia or gonorrhea. Ask your health care provider if you are at risk.  If you do not have HIV, but are at  risk, it may be recommended that you take a prescription medicine daily to prevent HIV infection. This is called pre-exposure prophylaxis (PrEP). You are considered at risk if:  You are sexually active and do not regularly use condoms or know the HIV status of your partner(s).  You take drugs by injection.  You are sexually active with a partner who has HIV. Talk with your health care provider about whether you are  at high risk of being infected with HIV. If you choose to begin PrEP, you should first be tested for HIV. You should then be tested every 3 months for as long as you are taking PrEP.  PREGNANCY   If you are premenopausal and you may become pregnant, ask your health care provider about preconception counseling.  If you may become pregnant, take 400 to 800 micrograms (mcg) of folic acid every day.  If you want to prevent pregnancy, talk to your health care provider about birth control (contraception). OSTEOPOROSIS AND MENOPAUSE   Osteoporosis is a disease in which the bones lose minerals and strength with aging. This can result in serious bone fractures. Your risk for osteoporosis can be identified using a bone density scan.  If you are 40 years of age or older, or if you are at risk for osteoporosis and fractures, ask your health care provider if you should be screened.  Ask your health care provider whether you should take a calcium or vitamin D supplement to lower your risk for osteoporosis.  Menopause may have certain physical symptoms and risks.  Hormone replacement therapy may reduce some of these symptoms and risks. Talk to your health care provider about whether hormone replacement therapy is right for you.  HOME CARE INSTRUCTIONS   Schedule regular health, dental, and eye exams.  Stay current with your immunizations.   Do not use any tobacco products including cigarettes, chewing tobacco, or electronic cigarettes.  If you are pregnant, do not drink alcohol.  If  you are breastfeeding, limit how much and how often you drink alcohol.  Limit alcohol intake to no more than 1 drink per day for nonpregnant women. One drink equals 12 ounces of beer, 5 ounces of wine, or 1 ounces of hard liquor.  Do not use street drugs.  Do not share needles.  Ask your health care provider for help if you need support or information about quitting drugs.  Tell your health care provider if you often feel depressed.  Tell your health care provider if you have ever been abused or do not feel safe at home.   This information is not intended to replace advice given to you by your health care provider. Make sure you discuss any questions you have with your health care provider.   Document Released: 02/01/2011 Document Revised: 08/09/2014 Document Reviewed: 06/20/2013 Elsevier Interactive Patient Education [employer].

## 2015-07-17 NOTE — Progress Notes (Signed)
Subjective:   Helen Marshall is a 69 y.o. female who presents for an Initial Medicare Annual Wellness Visit.  Review of Systems    No ROS.  Medicare Wellness Visit.  Cardiac Risk Factors include: hypertension;advanced age (>82men, >23 women)     Objective:    Today's Vitals   07/17/15 1312  BP: 122/68  Pulse: 83  Temp: 98 F (36.7 C)  TempSrc: Oral  Resp: 14  Height: 5' 2.5" (1.588 m)  Weight: 183 lb 1.9 oz (83.063 kg)  SpO2: 97%    Current Medications (verified) Outpatient Encounter Prescriptions as of 07/17/2015  Medication Sig  . amLODipine (NORVASC) 5 MG tablet Take 1 tablet (5 mg total) by mouth daily.  . benazepril (LOTENSIN) 40 MG tablet Take 1 tablet (40 mg total) by mouth daily.  . calcium-vitamin D (CALCIUM 500+D) 500-400 MG-UNIT per tablet Take 1 tablet by mouth daily.   . fish oil-omega-3 fatty acids 1000 MG capsule 5 (five) times daily.   . fluticasone (FLONASE) 50 MCG/ACT nasal spray Place 2 sprays into both nostrils as needed.  . GuaiFENesin (MUCINEX PO) Take 1 tablet by mouth as needed.  . hydrochlorothiazide (HYDRODIURIL) 25 MG tablet Take 1 tablet (25 mg total) by mouth daily.  . Multiple Vitamin (MULTIVITAMIN) tablet Take 1 tablet by mouth daily.  . Multiple Vitamins-Minerals (EYE VITAMINS PO) Take by mouth.  . niacin (NIASPAN) 1000 MG CR tablet Take 1,000 mg by mouth 2 (two) times daily.  . potassium chloride (K-DUR) 10 MEQ tablet Take 1 tablet (10 mEq total) by mouth 2 (two) times daily.  . ranitidine (ZANTAC) 150 MG tablet Take 1 tablet (150 mg total) by mouth 2 (two) times daily as needed for heartburn.  . tamoxifen (NOLVADEX) 20 MG tablet Take 1 tablet (20 mg total) by mouth daily.  Marland Kitchen triamcinolone cream (KENALOG) 0.1 % Apply 1 application topically as needed.    No facility-administered encounter medications on file as of 07/17/2015.    Allergies (verified) Crestor; Demerol; Latex; Lipitor; Lovastatin; Pravastatin; Tramadol; Vicodin; and  Zocor   History: Past Medical History  Diagnosis Date  . Hypertension   . Hypercholesterolemia   . IBS (irritable bowel syndrome)   . GERD (gastroesophageal reflux disease)   . Carotid bruit     left  . Diverticulosis   . Hyperglycemia   . Lumbar disc disease   . Environmental allergies   . Heart murmur   . Diverticulosis 2012  . Abnormal mammogram 12/19/2012    left  . Cancer ([place])     Breast  . 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  . Lipoma of colon   . Colon polyps   . Allergy   . Cataract    Past Surgical History  Procedure Laterality Date  . Cholecystectomy  2001  . Rotator cuff surgery  1998  . Nasal sinus surgery  1997  . Tubal ligation  1976  . Abdominal hysterectomy  1989    fibroids  . Colonoscopy  06/02/2011    Verdie Shire, MD; diverticulosis, submucosal lipoma of the sigmoid colon.  . Ankle surgery Left 1999  . 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 Breast surgery Left 1970    biopsy  . Hernia repair  2001, 2004  . Upper gi endoscopy  08/22/14  Dr Billie Lade   Family History  Problem Relation Age of Onset  . CVA Father   . Alcohol abuse Father   . Heart disease Mother     myocardial infarction  . Diabetes Mother   . Hypertension Brother     x2  . Alcohol abuse Brother   . Arthritis/Rheumatoid Sister     x2  . Breast cancer Sister 75  . Osteoarthritis Sister   . Asthma Sister   . Hypothyroidism Sister   . Cancer Other     breast - paternal first cousin  . Colon cancer Maternal Uncle   . Colon polyps Son   . Colon polyps Sister    Social History   Occupational History  . Retired    Social History Main Topics  . Smoking status: Never Smoker   . Smokeless tobacco: Never Used  . Alcohol Use: No  . Drug Use: No  . Sexual Activity: No    Tobacco  Counseling Counseling given: Not Answered   Activities of Daily Living In your present state of health, do you have any difficulty performing the following activities: 07/17/2015  Hearing? N  Vision? N  Difficulty concentrating or making decisions? N  Walking or climbing stairs? N  Dressing or bathing? N  Doing errands, shopping? N  Preparing Food and eating ? N  Using the Toilet? N  In the past six months, have you accidently leaked urine? N  Do you have problems with loss of bowel control? N  Managing your Medications? N  Managing your Finances? N  Housekeeping or managing your Housekeeping? N    Immunizations and Health Maintenance Immunization History  Administered Date(s) Administered  . Influenza Split 05/16/2012  . Influenza,inj,Quad PF,36+ Mos 05/09/2013, 05/23/2014, 07/03/2015  . Pneumococcal Conjugate-13 09/13/2013  . Pneumococcal Polysaccharide-23 11/08/2014  . Tdap 04/26/2014  . Zoster 08/02/2009   There are no preventive care reminders to display for this patient.  Patient Care Team: Einar Pheasant, MD as PCP - General (Internal Medicine) Robert Bellow, MD (General Surgery)  Indicate any recent Medical Services you may have received from other than [facility] providers in the past year (date may be approximate).     Assessment:   This is a routine wellness examination for [employer].  The goal of the wellness visit is to assist the patient how to close the gaps in care and create a preventative care plan for the patient.   Calcium and Vit D as appropriate/ Osteoporosis risk reviewed.   Taking scheduled meds without issues; no barriers identified.  Safety issues reviewed; smoke detectors in the home. No firearms in the home.  Wears seatbelts when driving or riding with others. No violence in the home.  No identified risk were noted; The patient was oriented x 3; appropriate in dress and manner and no objective failures at ADL's or IADL's.   Hep C Screening  completed today.  Patient Concerns: None at this time.  Follow up with PCP as needed.  Hearing/Vision screen Hearing Screening Comments: Passes the whisper test Vision Screening Comments: Followed by Dr. Matilde Sprang Annual visits Wears glasses  Dietary issues and exercise activities discussed: Current Exercise Habits:: The patient does not participate in regular exercise at present  Goals    . Increase physical activity     Start chair exercises as demonstrated, as tolerated.  Education provided.        Depression Screen PHQ 2/9 Scores 07/17/2015 11/08/2014 09/13/2013 05/09/2013  PHQ - 2 Score 0  0 0 0    Fall Risk Fall Risk  07/17/2015 11/08/2014 09/13/2013 05/09/2013  Falls in the past year? No No No No    Cognitive Function: MMSE - Mini Mental State Exam 07/17/2015  Orientation to time 5  Orientation to Place 5  Registration 3  Attention/ Calculation 5  Recall 3  Language- name 2 objects 2  Language- repeat 1  Language- follow 3 step command 3  Language- read & follow direction 1  Write a sentence 1  Copy design 1  Total score 30    Screening Tests Health Maintenance  Topic Date Due  . MAMMOGRAM  08/07/2015  . INFLUENZA VACCINE  03/02/2016  . COLONOSCOPY  06/01/2021  . TETANUS/TDAP  04/26/2024  . DEXA SCAN  Completed  . ZOSTAVAX  Completed  . Hepatitis C Screening  Completed  . PNA vac Low Risk Adult  Completed      Plan:   End of life planning was discussed; aging in home or other; Advanced directives; Copy requested of current HCPOA/Living Will.   Return in April for labs and scheduled appointment with PCP.  During the course of the visit, E[place] was educated and counseled about the following appropriate screening and preventive services:   Vaccines to include Pneumoccal, Influenza, Hepatitis B, Td, Zostavax, HCV  Electrocardiogram  Cardiovascular disease screening  Colorectal cancer screening  Bone density screening  Diabetes screening  Glaucoma  screening  Mammography/PAP  Nutrition counseling  Smoking cessation counseling  Patient Instructions (the written plan) were given to the patient.    Varney Biles, LPN   [id number]     Reviewed above information.  Agree with plan.   Dr Nicki Reaper

## 2015-07-18 ENCOUNTER — Encounter: Payer: Self-pay | Admitting: Internal Medicine

## 2015-07-18 LAB — HEPATITIS C ANTIBODY: HCV Ab: NEGATIVE

## 2015-08-19 ENCOUNTER — Ambulatory Visit: Payer: Self-pay | Admitting: General Surgery

## 2015-08-20 ENCOUNTER — Encounter: Payer: Self-pay | Admitting: General Surgery

## 2015-08-20 ENCOUNTER — Other Ambulatory Visit: Payer: Self-pay | Admitting: Internal Medicine

## 2015-08-27 ENCOUNTER — Encounter: Payer: Self-pay | Admitting: General Surgery

## 2015-08-27 ENCOUNTER — Ambulatory Visit (INDEPENDENT_AMBULATORY_CARE_PROVIDER_SITE_OTHER): Payer: Medicare Other | Admitting: General Surgery

## 2015-08-27 VITALS — BP 122/62 | HR 72 | Resp 12 | Ht 66.0 in | Wt 183.0 lb

## 2015-08-27 DIAGNOSIS — D0512 Intraductal carcinoma in situ of left breast: Secondary | ICD-10-CM

## 2015-08-27 NOTE — Progress Notes (Signed)
Patient ID: Helen Marshall, female   DOB: 1946-03-14, 70 y.o.   MRN: [id number]  Chief Complaint  Patient presents with  . Follow-up    mammogram    HPI Helen Marshall is a 70 y.o. female who presents for a breast evaluation. The most recent mammogram was done on 08/19/15 .  Patient does perform regular self breast checks and gets regular mammograms done.    The patient reports that she noticed a new bulge at the umbilicus around the time of last year's visit but had failed to mention it. No interval change. No discomfort. No obstructive symptoms.  I personally reviewed the patient's history.  HPI  Past Medical History  Diagnosis Date  . Hypertension   . Hypercholesterolemia   . IBS (irritable bowel syndrome)   . GERD (gastroesophageal reflux disease)   . Carotid bruit     left  . Diverticulosis   . Hyperglycemia   . Lumbar disc disease   . Environmental allergies   . Heart murmur   . Diverticulosis 2012  . Abnormal mammogram 12/19/2012    left  . Cancer ([place])     Breast  . 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  . Lipoma of colon   . Colon polyps   . Allergy   . Cataract     Past Surgical History  Procedure Laterality Date  . Cholecystectomy  2001  . Rotator cuff surgery  1998  . Nasal sinus surgery  1997  . Tubal ligation  1976  . Abdominal hysterectomy  1989    fibroids  . Colonoscopy  06/02/2011    Verdie Shire, MD; diverticulosis, submucosal lipoma of the sigmoid colon.  . Ankle surgery Left 1999  . 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 Breast surgery Left 1970    biopsy  . Hernia repair  2001, 2004  . Upper gi endoscopy  08/22/14    Dr Billie Lade    Family History  Problem Relation Age of Onset  . CVA Father   . Alcohol abuse Father   . Heart disease Mother     myocardial  infarction  . Diabetes Mother   . Hypertension Brother     x2  . Alcohol abuse Brother   . Arthritis/Rheumatoid Sister     x2  . Breast cancer Sister 72  . Osteoarthritis Sister   . Asthma Sister   . Hypothyroidism Sister   . Cancer Other     breast - paternal first cousin  . Colon cancer Maternal Uncle   . Colon polyps Son   . Colon polyps Sister     Social History Social History  Substance Use Topics  . Smoking status: Never Smoker   . Smokeless tobacco: Never Used  . Alcohol Use: No    Allergies  Allergen Reactions  . Crestor [Rosuvastatin] Other (See Comments)    Leg cramping   . Demerol [Meperidine] Nausea And Vomiting  . Latex Other (See Comments)    Redness, hands breakout  . Lipitor [Atorvastatin] Other (See Comments)    Intolerant   . Lovastatin Other (See Comments)    Elevated CK  . Pravastatin Other (See Comments)    intolerant  . Tramadol Nausea Only  . Vicodin [Hydrocodone-Acetaminophen] Other (See Comments)    itching  . Zocor [Simvastatin]  Other (See Comments)    Intolerant     Current Outpatient Prescriptions  Medication Sig Dispense Refill  . amLODipine (NORVASC) 5 MG tablet Take 1 tablet (5 mg total) by mouth daily. 90 tablet 1  . benazepril (LOTENSIN) 40 MG tablet Take 1 tablet (40 mg total) by mouth daily. 90 tablet 2  . calcium-vitamin D (CALCIUM 500+D) 500-400 MG-UNIT per tablet Take 1 tablet by mouth daily.     . fish oil-omega-3 fatty acids 1000 MG capsule 5 (five) times daily.     . fluticasone (FLONASE) 50 MCG/ACT nasal spray Place 2 sprays into both nostrils as needed. 16 g 5  . hydrochlorothiazide (HYDRODIURIL) 25 MG tablet Take 1 tablet (25 mg total) by mouth daily. 90 tablet 3  . Multiple Vitamin (MULTIVITAMIN) tablet Take 1 tablet by mouth daily.    . Multiple Vitamins-Minerals (EYE VITAMINS PO) Take by mouth.    . niacin (NIASPAN) 1000 MG CR tablet Take 1,000 mg by mouth 2 (two) times daily.    . potassium chloride (K-DUR) 10  MEQ tablet TAKE 1 TABLET BY MOUTH TWICE DAILY. 60 tablet 11  . Probiotic Product (PROBIOTIC PO) Take by mouth.    . tamoxifen (NOLVADEX) 20 MG tablet Take 1 tablet (20 mg total) by mouth daily. 90 tablet 3  . triamcinolone cream (KENALOG) 0.1 % Apply 1 application topically as needed.      No current facility-administered medications for this visit.    Review of Systems Review of Systems  Constitutional: Negative.   Respiratory: Negative.   Cardiovascular: Negative.     Blood pressure 122/62, pulse 72, resp. rate 12, height 5\' 6"  (1.676 m), weight 183 lb (83.008 kg), last menstrual period 07/21/1988.  Physical Exam Physical Exam  Constitutional: She is oriented to person, place, and time. She appears well-developed and well-nourished.  Eyes: Conjunctivae are normal. No scleral icterus.  Neck: Neck supple.  Cardiovascular: Normal rate, regular rhythm and normal heart sounds.   Pulmonary/Chest: Effort normal and breath sounds normal. Right breast exhibits no inverted nipple, no mass, no nipple discharge, no skin change and no tenderness. Left breast exhibits no inverted nipple, no mass, no nipple discharge, no skin change and no tenderness.    Left breast well healed incision at 3 o'clk   Abdominal: Soft. Normal appearance. A hernia ( 1.5 cm defect) is present.    Lymphadenopathy:    She has no cervical adenopathy.    She has no axillary adenopathy.  Neurological: She is alert and oriented to person, place, and time.  Skin: Skin is warm and dry.  Psychiatric: She has a normal mood and affect.    Data Reviewed Bilateral mammograms dated 08/19/2015 completed at [facility] showed postsurgical changes in the left. No other abnormalities noted. BI-RADS-2.  Assessment    Doing well status post left breast DCIS excision.  Recurrent umbilical hernia (second occurrence) asymptomatic.    Plan    At present we'll plan for follow-up in one year. If she becomes more  symptomatic in regards to her umbilicus she has been encouraged to call for assessment.    PCP:  Einar Pheasant This information has been scribed by Gaspar Cola CMA.    Robert Bellow 08/28/2015, 5:00 PM

## 2015-08-27 NOTE — Patient Instructions (Signed)
Return in one year. Continue self breast exams. Call office for any new breast issues or concerns.

## 2015-08-28 ENCOUNTER — Encounter: Payer: Self-pay | Admitting: General Surgery

## 2015-10-14 ENCOUNTER — Encounter: Payer: Self-pay | Admitting: Internal Medicine

## 2015-11-03 ENCOUNTER — Other Ambulatory Visit (INDEPENDENT_AMBULATORY_CARE_PROVIDER_SITE_OTHER): Payer: Medicare Other

## 2015-11-03 DIAGNOSIS — R739 Hyperglycemia, unspecified: Secondary | ICD-10-CM | POA: Diagnosis not present

## 2015-11-03 DIAGNOSIS — E7800 Pure hypercholesterolemia, unspecified: Secondary | ICD-10-CM

## 2015-11-03 DIAGNOSIS — R7989 Other specified abnormal findings of blood chemistry: Secondary | ICD-10-CM | POA: Diagnosis not present

## 2015-11-03 DIAGNOSIS — I10 Essential (primary) hypertension: Secondary | ICD-10-CM

## 2015-11-03 DIAGNOSIS — R945 Abnormal results of liver function studies: Secondary | ICD-10-CM

## 2015-11-03 LAB — HEMOGLOBIN A1C: HEMOGLOBIN A1C: 6 % (ref 4.6–6.5)

## 2015-11-03 LAB — HEPATIC FUNCTION PANEL
ALBUMIN: 3.8 g/dL (ref 3.5–5.2)
ALT: 17 U/L (ref 0–35)
AST: 21 U/L (ref 0–37)
Alkaline Phosphatase: 43 U/L (ref 39–117)
Bilirubin, Direct: 0.1 mg/dL (ref 0.0–0.3)
Total Bilirubin: 0.4 mg/dL (ref 0.2–1.2)
Total Protein: 6.3 g/dL (ref 6.0–8.3)

## 2015-11-03 LAB — CBC WITH DIFFERENTIAL/PLATELET
BASOS ABS: 0 10*3/uL (ref 0.0–0.1)
Basophils Relative: 0.9 % (ref 0.0–3.0)
Eosinophils Absolute: 0.2 10*3/uL (ref 0.0–0.7)
Eosinophils Relative: 3.8 % (ref 0.0–5.0)
HEMATOCRIT: 34.1 % — AB (ref 36.0–46.0)
HEMOGLOBIN: 11.3 g/dL — AB (ref 12.0–15.0)
LYMPHS ABS: 1.7 10*3/uL (ref 0.7–4.0)
LYMPHS PCT: 38.5 % (ref 12.0–46.0)
MCHC: 33.2 g/dL (ref 30.0–36.0)
MCV: 86 fl (ref 78.0–100.0)
MONOS PCT: 8.6 % (ref 3.0–12.0)
Monocytes Absolute: 0.4 10*3/uL (ref 0.1–1.0)
NEUTROS PCT: 48.2 % (ref 43.0–77.0)
Neutro Abs: 2.2 10*3/uL (ref 1.4–7.7)
Platelets: 241 10*3/uL (ref 150.0–400.0)
RBC: 3.97 Mil/uL (ref 3.87–5.11)
RDW: 14.2 % (ref 11.5–15.5)
WBC: 4.5 10*3/uL (ref 4.0–10.5)

## 2015-11-03 LAB — BASIC METABOLIC PANEL
BUN: 15 mg/dL (ref 6–23)
CALCIUM: 9.2 mg/dL (ref 8.4–10.5)
CHLORIDE: 103 meq/L (ref 96–112)
CO2: 28 mEq/L (ref 19–32)
CREATININE: 0.72 mg/dL (ref 0.40–1.20)
GFR: 85.05 mL/min (ref >60.00)
Glucose, Bld: 102 mg/dL — ABNORMAL HIGH (ref 70–99)
Potassium: 3.9 mEq/L (ref 3.5–5.1)
Sodium: 138 mEq/L (ref 135–145)

## 2015-11-03 LAB — LIPID PANEL
Cholesterol: 192 mg/dL (ref 0–200)
HDL: 71.5 mg/dL (ref >39.00)
LDL CALC: 95 mg/dL (ref 0–99)
NONHDL: 120.99
Total CHOL/HDL Ratio: 3
Triglycerides: 128 mg/dL (ref 0.0–149.0)
VLDL: 25.6 mg/dL (ref 0.0–40.0)

## 2015-11-04 ENCOUNTER — Other Ambulatory Visit: Payer: Self-pay | Admitting: Internal Medicine

## 2015-11-04 ENCOUNTER — Other Ambulatory Visit (INDEPENDENT_AMBULATORY_CARE_PROVIDER_SITE_OTHER): Payer: Medicare Other

## 2015-11-04 ENCOUNTER — Encounter: Payer: Self-pay | Admitting: Internal Medicine

## 2015-11-04 DIAGNOSIS — D649 Anemia, unspecified: Secondary | ICD-10-CM

## 2015-11-04 LAB — IBC PANEL
IRON: 42 ug/dL (ref 42–145)
Saturation Ratios: 8.6 % — ABNORMAL LOW (ref 20.0–50.0)
TRANSFERRIN: 350 mg/dL (ref 212.0–360.0)

## 2015-11-04 NOTE — Progress Notes (Signed)
Order placed for iron studies and B12.

## 2015-11-05 ENCOUNTER — Encounter: Payer: Self-pay | Admitting: Internal Medicine

## 2015-11-05 ENCOUNTER — Other Ambulatory Visit: Payer: Self-pay | Admitting: *Deleted

## 2015-11-05 ENCOUNTER — Encounter: Payer: Self-pay | Admitting: *Deleted

## 2015-11-05 ENCOUNTER — Ambulatory Visit (INDEPENDENT_AMBULATORY_CARE_PROVIDER_SITE_OTHER): Payer: Medicare Other | Admitting: Internal Medicine

## 2015-11-05 VITALS — BP 118/70 | HR 76 | Temp 97.8°F | Resp 18 | Ht 63.0 in | Wt 184.0 lb

## 2015-11-05 DIAGNOSIS — I10 Essential (primary) hypertension: Secondary | ICD-10-CM

## 2015-11-05 DIAGNOSIS — R7989 Other specified abnormal findings of blood chemistry: Secondary | ICD-10-CM

## 2015-11-05 DIAGNOSIS — D649 Anemia, unspecified: Secondary | ICD-10-CM | POA: Diagnosis not present

## 2015-11-05 DIAGNOSIS — R945 Abnormal results of liver function studies: Secondary | ICD-10-CM

## 2015-11-05 DIAGNOSIS — Z0000 Encounter for general adult medical examination without abnormal findings: Secondary | ICD-10-CM | POA: Diagnosis not present

## 2015-11-05 DIAGNOSIS — D509 Iron deficiency anemia, unspecified: Secondary | ICD-10-CM

## 2015-11-05 DIAGNOSIS — K219 Gastro-esophageal reflux disease without esophagitis: Secondary | ICD-10-CM | POA: Diagnosis not present

## 2015-11-05 DIAGNOSIS — E7800 Pure hypercholesterolemia, unspecified: Secondary | ICD-10-CM

## 2015-11-05 DIAGNOSIS — R739 Hyperglycemia, unspecified: Secondary | ICD-10-CM

## 2015-11-05 DIAGNOSIS — D0512 Intraductal carcinoma in situ of left breast: Secondary | ICD-10-CM

## 2015-11-05 LAB — CBC WITH DIFFERENTIAL/PLATELET
Basophils Absolute: 0 10*3/uL (ref 0.0–0.1)
Basophils Relative: 0.8 % (ref 0.0–3.0)
EOS ABS: 0.1 10*3/uL (ref 0.0–0.7)
EOS PCT: 3 % (ref 0.0–5.0)
HCT: 33.4 % — ABNORMAL LOW (ref 36.0–46.0)
HEMOGLOBIN: 11.3 g/dL — AB (ref 12.0–15.0)
LYMPHS PCT: 36.7 % (ref 12.0–46.0)
Lymphs Abs: 1.5 10*3/uL (ref 0.7–4.0)
MCHC: 33.9 g/dL (ref 30.0–36.0)
MCV: 84.5 fl (ref 78.0–100.0)
MONO ABS: 0.6 10*3/uL (ref 0.1–1.0)
Monocytes Relative: 13.5 % — ABNORMAL HIGH (ref 3.0–12.0)
Neutro Abs: 1.9 10*3/uL (ref 1.4–7.7)
Neutrophils Relative %: 46 % (ref 43.0–77.0)
Platelets: 226 10*3/uL (ref 150.0–400.0)
RBC: 3.95 Mil/uL (ref 3.87–5.11)
RDW: 14.8 % (ref 11.5–15.5)
WBC: 4.2 10*3/uL (ref 4.0–10.5)

## 2015-11-05 LAB — VITAMIN B12: VITAMIN B 12: 711 pg/mL (ref 211–911)

## 2015-11-05 LAB — FERRITIN: Ferritin: 7.6 ng/mL — ABNORMAL LOW (ref 10.0–291.0)

## 2015-11-05 MED ORDER — INTEGRA 62.5-62.5-40-3 MG PO CAPS: 1.0000 | ORAL_CAPSULE | Freq: Every day | ORAL | Status: DC

## 2015-11-05 NOTE — Progress Notes (Signed)
Patient ID: Helen Marshall, female   DOB: 31-Oct-1945, 70 y.o.   MRN: [id number]   Subjective:    Patient ID: Helen Marshall, female    DOB: 1945/10/28, 70 y.o.   MRN: [id number]  HPI  Patient here for her physical exam.  She reports she has been doing relatively well.  No chest pain or tightness.  No sob.  No acid reflux.  Had diarrhea in 07/2015, but reports bowels have been better since.  Recent labs revealed decreased hgb.  Iron studies and B12 results pending.  Denies any blood in the stool.  No acid reflux.  Handling stress.    Past Medical History  Diagnosis Date  . Hypertension   . Hypercholesterolemia   . IBS (irritable bowel syndrome)   . GERD (gastroesophageal reflux disease)   . Carotid bruit     left  . Diverticulosis   . Hyperglycemia   . Lumbar disc disease   . Environmental allergies   . Heart murmur   . Diverticulosis 2012  . Abnormal mammogram 12/19/2012    left  . Lipoma of colon   . Colon polyps   . Allergy   . Cataract   . Cancer ([place])     Breast  . 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   Past Surgical History  Procedure Laterality Date  . Cholecystectomy  2001  . Rotator cuff surgery  1998  . Nasal sinus surgery  1997  . Tubal ligation  1976  . Abdominal hysterectomy  1989    fibroids  . Colonoscopy  06/02/2011    Verdie Shire, MD; diverticulosis, submucosal lipoma of the sigmoid colon.  . Ankle surgery Left 1999  . 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 Breast surgery Left 1970    biopsy  . Upper gi endoscopy  08/22/14    Dr Lenna Sciara. Hilarie Fredrickson  . Hernia repair  2001, 2004  . Hernia repair  01/22/2013    Repeat repair of umbilical defect, 2.5 cm. Primary repair.   Family History  Problem Relation Age of Onset  . CVA Father   . Alcohol abuse Father   . Heart disease Mother    myocardial infarction  . Diabetes Mother   . Hypertension Brother     x2  . Alcohol abuse Brother   . Arthritis/Rheumatoid Sister     x2  . Breast cancer Sister 76  . Osteoarthritis Sister   . Asthma Sister   . Hypothyroidism Sister   . Cancer Other     breast - paternal first cousin  . Colon cancer Maternal Uncle   . Colon polyps Son   . Colon polyps Sister    Social History   Social History  . Marital Status: Married    Spouse Name: N/A  . Number of Children: 2  . Years of Education: N/A   Occupational History  . Retired    Social History Main Topics  . Smoking status: Never Smoker   . Smokeless tobacco: Never Used  . Alcohol Use: No  . Drug Use: No  . Sexual Activity: No   Other Topics Concern  . None   Social History Narrative    Outpatient Encounter Prescriptions as of 11/05/2015  Medication Sig  . amLODipine (NORVASC) 5 MG tablet Take 1 tablet (5 mg total)  by mouth daily.  . benazepril (LOTENSIN) 40 MG tablet Take 1 tablet (40 mg total) by mouth daily.  . calcium-vitamin D (CALCIUM 500+D) 500-400 MG-UNIT per tablet Take 1 tablet by mouth daily.   . fish oil-omega-3 fatty acids 1000 MG capsule 5 (five) times daily.   . fluticasone (FLONASE) 50 MCG/ACT nasal spray Place 2 sprays into both nostrils as needed.  . hydrochlorothiazide (HYDRODIURIL) 25 MG tablet Take 1 tablet (25 mg total) by mouth daily.  . Multiple Vitamin (MULTIVITAMIN) tablet Take 1 tablet by mouth daily.  . Multiple Vitamins-Minerals (EYE VITAMINS PO) Take by mouth.  . niacin (NIASPAN) 1000 MG CR tablet Take 1,000 mg by mouth 2 (two) times daily.  . potassium chloride (K-DUR) 10 MEQ tablet TAKE 1 TABLET BY MOUTH TWICE DAILY.  . Probiotic Product (PROBIOTIC PO) Take by mouth.  . ranitidine (ZANTAC) 150 MG tablet daily as needed.  . tamoxifen (NOLVADEX) 20 MG tablet Take 1 tablet (20 mg total) by mouth daily.  Marland Kitchen triamcinolone cream (KENALOG) 0.1 % Apply 1 application topically as needed.     No facility-administered encounter medications on file as of 11/05/2015.    Review of Systems  Constitutional: Negative for appetite change and unexpected weight change.  HENT: Negative for congestion and sinus pressure.   Eyes: Negative for pain and visual disturbance.  Respiratory: Negative for cough, chest tightness and shortness of breath.   Cardiovascular: Negative for chest pain, palpitations and leg swelling.  Gastrointestinal: Negative for nausea, vomiting, abdominal pain and diarrhea (previous diarrhea.  none now. ).  Genitourinary: Negative for dysuria and difficulty urinating.  Musculoskeletal: Negative for back pain and joint swelling.  Skin: Negative for color change and rash.  Neurological: Negative for dizziness, light-headedness and headaches.  Hematological: Negative for adenopathy. Does not bruise/bleed easily.  Psychiatric/Behavioral: Negative for dysphoric mood and agitation.       Objective:     Blood pressure rechecked by me:  120/72  Physical Exam  Constitutional: She is oriented to person, place, and time. She appears well-developed and well-nourished. No distress.  HENT:  Nose: Nose normal.  Mouth/Throat: Oropharynx is clear and moist.  Eyes: Right eye exhibits no discharge. Left eye exhibits no discharge. No scleral icterus.  Neck: Neck supple. No thyromegaly present.  Cardiovascular: Normal rate and regular rhythm.   Pulmonary/Chest: Breath sounds normal. No accessory muscle usage. No tachypnea. No respiratory distress. She has no decreased breath sounds. She has no wheezes. She has no rhonchi. Right breast exhibits no inverted nipple, no mass, no nipple discharge and no tenderness (no axillary adenopathy). Left breast exhibits no inverted nipple, no mass, no nipple discharge and no tenderness (no axilarry adenopathy).  Abdominal: Soft. Bowel sounds are normal. There is no tenderness.  Musculoskeletal: She exhibits no edema or tenderness.   Lymphadenopathy:    She has no cervical adenopathy.  Neurological: She is alert and oriented to person, place, and time.  Skin: Skin is warm. No rash noted. No erythema.  Psychiatric: She has a normal mood and affect. Her behavior is normal.    BP 118/70 mmHg  Pulse 76  Temp(Src) 97.8 F (36.6 C) (Oral)  Resp 18  Ht _0  (1.6 m)  Wt 184 lb (83.462 kg)  BMI 32.60 kg/m2  SpO2 96%  LMP 07/21/1988 Wt Readings from Last 3 Encounters:  11/05/15 184 lb (83.462 kg)  08/27/15 183 lb (83.008 kg)  07/17/15 183 lb 1.9 oz (83.063 kg)     Lab Results  Component Value Date   WBC 4.2 11/05/2015   HGB 11.3* 11/05/2015   HCT 33.4* 11/05/2015   PLT 226.0 11/05/2015   GLUCOSE 102* 11/03/2015   CHOL 192 11/03/2015   TRIG 128.0 11/03/2015   HDL 71.50 11/03/2015   LDLDIRECT 101.4 05/07/2013   LDLCALC 95 11/03/2015   ALT 17 11/03/2015   AST 21 11/03/2015   NA 138 11/03/2015   K 3.9 11/03/2015   CL 103 11/03/2015   CREATININE 0.72 11/03/2015   BUN 15 11/03/2015   CO2 28 11/03/2015   TSH 1.14 02/28/2015   HGBA1C 6.0 11/03/2015   MICROALBUR 0.5 01/16/2014        Assessment & Plan:   Problem List Items Addressed This Visit    Abnormal liver function test    Follow liver panel.   Lab Results  Component Value Date   ALT 17 11/03/2015   AST 21 11/03/2015   ALKPHOS 43 11/03/2015   BILITOT 0.4 11/03/2015        Anemia - Primary    Recent labs revealed slightly decreased hgb.  Iron studies and B12 results pending.  If iron deficient, will refer back to GI for evaluation of iron deficient anemia.        Relevant Orders   CBC with Differential/Platelet (Completed)   GERD (gastroesophageal reflux disease)    On zantac.  No upper symptoms reported.       Relevant Medications   ranitidine (ZANTAC) 150 MG tablet   Health care maintenance    Physical today 11/05/15.  Mammogram 08/19/15 - birads II.  Followed by Dr Preston Fleeting.  Colonoscopy 06/02/11 - diverticulosis.         Hypercholesterolemia    Low cholesterol diet and exercise.  Recent LDL 65.  Follow.        Hyperglycemia    Low carb diet and exercise.  Recent a1c 6.0.  Follow met b and a1c.       Hypertension    Blood pressure under good control.  Continue same medication regimen.  Follow pressures.  Follow metabolic panel.        Neoplasm of left breast, primary tumor 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)    On tamoxifen.  Sees Dr Bary Castilla.  mammomgram 08/19/15 - birads II.            Einar Pheasant, MD

## 2015-11-05 NOTE — Progress Notes (Signed)
Pre-visit discussion using our clinic review tool. No additional management support is needed unless otherwise documented below in the visit note.  

## 2015-11-05 NOTE — Telephone Encounter (Signed)
Sent in rx for [employer]

## 2015-11-07 NOTE — Telephone Encounter (Signed)
Order placed for referral to GI.  Pt notified via my chart.   

## 2015-11-10 ENCOUNTER — Encounter: Payer: Self-pay | Admitting: Internal Medicine

## 2015-11-10 NOTE — Assessment & Plan Note (Signed)
On tamoxifen.  Sees Dr Bary Castilla.  mammomgram 08/19/15 - birads II.

## 2015-11-10 NOTE — Assessment & Plan Note (Signed)
Blood pressure under good control.  Continue same medication regimen.  Follow pressures.  Follow metabolic panel.   

## 2015-11-10 NOTE — Assessment & Plan Note (Signed)
On zantac.  No upper symptoms reported.

## 2015-11-10 NOTE — Assessment & Plan Note (Signed)
Follow liver panel.   Lab Results  Component Value Date   ALT 17 11/03/2015   AST 21 11/03/2015   ALKPHOS 43 11/03/2015   BILITOT 0.4 11/03/2015

## 2015-11-10 NOTE — Assessment & Plan Note (Signed)
Physical today 11/05/15.  Mammogram 08/19/15 - birads II.  Followed by Dr Preston Fleeting.  Colonoscopy 06/02/11 - diverticulosis.

## 2015-11-10 NOTE — Assessment & Plan Note (Signed)
Low cholesterol diet and exercise.  Recent LDL 65.  Follow.

## 2015-11-10 NOTE — Assessment & Plan Note (Signed)
Low carb diet and exercise.  Recent a1c 6.0.  Follow met b and a1c.  

## 2015-11-10 NOTE — Assessment & Plan Note (Signed)
Recent labs revealed slightly decreased hgb.  Iron studies and B12 results pending.  If iron deficient, will refer back to GI for evaluation of iron deficient anemia.

## 2015-11-20 ENCOUNTER — Other Ambulatory Visit: Payer: Self-pay | Admitting: Internal Medicine

## 2015-12-03 ENCOUNTER — Other Ambulatory Visit (INDEPENDENT_AMBULATORY_CARE_PROVIDER_SITE_OTHER): Payer: Medicare Other

## 2015-12-03 ENCOUNTER — Encounter: Payer: Self-pay | Admitting: Internal Medicine

## 2015-12-03 DIAGNOSIS — D649 Anemia, unspecified: Secondary | ICD-10-CM | POA: Diagnosis not present

## 2015-12-03 LAB — CBC WITH DIFFERENTIAL/PLATELET
Basophils Absolute: 0 10*3/uL (ref 0.0–0.1)
Basophils Relative: 0.5 % (ref 0.0–3.0)
Eosinophils Absolute: 0.1 10*3/uL (ref 0.0–0.7)
Eosinophils Relative: 2.3 % (ref 0.0–5.0)
HCT: 37.3 % (ref 36.0–46.0)
Hemoglobin: 12.5 g/dL (ref 12.0–15.0)
LYMPHS ABS: 1.7 10*3/uL (ref 0.7–4.0)
Lymphocytes Relative: 33.2 % (ref 12.0–46.0)
MCHC: 33.6 g/dL (ref 30.0–36.0)
MCV: 86.8 fl (ref 78.0–100.0)
MONO ABS: 0.5 10*3/uL (ref 0.1–1.0)
Monocytes Relative: 9.9 % (ref 3.0–12.0)
NEUTROS PCT: 54.1 % (ref 43.0–77.0)
Neutro Abs: 2.8 10*3/uL (ref 1.4–7.7)
Platelets: 231 10*3/uL (ref 150.0–400.0)
RBC: 4.29 Mil/uL (ref 3.87–5.11)
RDW: 16.5 % — AB (ref 11.5–15.5)
WBC: 5.1 10*3/uL (ref 4.0–10.5)

## 2015-12-03 LAB — FERRITIN: FERRITIN: 21 ng/mL (ref 10.0–291.0)

## 2015-12-19 ENCOUNTER — Other Ambulatory Visit: Payer: Self-pay | Admitting: Internal Medicine

## 2015-12-19 ENCOUNTER — Encounter: Payer: Self-pay | Admitting: *Deleted

## 2015-12-26 ENCOUNTER — Other Ambulatory Visit: Payer: Self-pay | Admitting: Internal Medicine

## 2016-01-13 ENCOUNTER — Other Ambulatory Visit (INDEPENDENT_AMBULATORY_CARE_PROVIDER_SITE_OTHER): Payer: Medicare Other

## 2016-01-13 ENCOUNTER — Encounter: Payer: Self-pay | Admitting: Internal Medicine

## 2016-01-13 ENCOUNTER — Ambulatory Visit (INDEPENDENT_AMBULATORY_CARE_PROVIDER_SITE_OTHER): Payer: Medicare Other | Admitting: Internal Medicine

## 2016-01-13 VITALS — BP 120/80 | HR 73 | Ht 63.0 in | Wt 183.2 lb

## 2016-01-13 DIAGNOSIS — D509 Iron deficiency anemia, unspecified: Secondary | ICD-10-CM | POA: Diagnosis not present

## 2016-01-13 DIAGNOSIS — Z1211 Encounter for screening for malignant neoplasm of colon: Secondary | ICD-10-CM

## 2016-01-13 DIAGNOSIS — K589 Irritable bowel syndrome without diarrhea: Secondary | ICD-10-CM | POA: Diagnosis not present

## 2016-01-13 LAB — IGA: IgA: 155 mg/dL (ref 68–378)

## 2016-01-13 NOTE — Progress Notes (Signed)
Subjective:    Patient ID: Helen Marshall, female    DOB: 1946/02/09, 70 y.o.   MRN: [id number]  HPI Helen Marshall is a 70 year old female with a past medical history of GERD, IBS, diverticulosis, breast cancer status post excision, hypertension and hyperlipidemia who is here for follow-up in consultation for new iron deficiency anemia. She was sent by her primary care physician, Dr. Nicki Reaper. She reports that she has been doing well and denies specific new GI complaint. She was started on VSL by me last year for irritable bowel with gas and bloating. She states this is really help. She continues on this daily. She denies pain in her abdomen or change in bowel symptoms. She denies seeing visible blood or melena. She's been taking Integra since being found to be iron deficient and her stools of been dark. Prior to iron no dark stools. No visible blood. No nausea or vomiting. No dysphagia or odynophagia. No pain with eating. Her last colonoscopy was 5 years ago performed in [place] and unremarkable. No polyps.  I performed an upper endoscopy for her last year on 08/22/2014. This showed mild gastropathy in the antrum but was otherwise normal exam. These biopsies showed reactive gastropathy in the setting of chronic gastritis. There was no H. pylori. Previous celiac test in 2016 was also negative.  Review of Systems As per history of present illness, otherwise negative  Current Medications, Allergies, Past Medical History, Past Surgical History, Family History and Social History were reviewed in [employer] record.     Objective:   Physical Exam BP 120/80 mmHg  Pulse 73  Ht 5\' 3"  (1.6 m)  Wt 183 lb 3.2 oz (83.099 kg)  BMI 32.46 kg/m2  SpO2 98%  LMP 07/21/1988 Constitutional: Well-developed and well-nourished. No distress. HEENT: Normocephalic and atraumatic. Oropharynx is clear and moist. No oropharyngeal exudate. Conjunctivae are normal.  No scleral icterus. Neck: Neck  supple. Trachea midline. Cardiovascular: Normal rate, regular rhythm and intact distal pulses. No M/R/G Pulmonary/chest: Effort normal and breath sounds normal. No wheezing, rales or rhonchi. Abdominal: Soft, nontender, nondistended. Bowel sounds active throughout. There are no masses palpable. No hepatosplenomegaly. Extremities: no clubbing, cyanosis, or edema Lymphadenopathy: No cervical adenopathy noted. Neurological: Alert and oriented to person place and time. Skin: Skin is warm and dry. No rashes noted. Psychiatric: Normal mood and affect. Behavior is normal.  CBC    Component Value Date/Time   WBC 5.1 12/03/2015 1135   RBC 4.29 12/03/2015 1135   HGB 12.5 12/03/2015 1135   HCT 37.3 12/03/2015 1135   PLT 231.0 12/03/2015 1135   MCV 86.8 12/03/2015 1135   MCHC 33.6 12/03/2015 1135   RDW 16.5* 12/03/2015 1135   LYMPHSABS 1.7 12/03/2015 1135   MONOABS 0.5 12/03/2015 1135   EOSABS 0.1 12/03/2015 1135   BASOSABS 0.0 12/03/2015 1135    Iron/TIBC/Ferritin/ %Sat    Component Value Date/Time   IRON 42 11/04/2015 1736   FERRITIN 21.0 12/03/2015 1135   IRONPCTSAT 8.6* 11/04/2015 1736   FERRITIN (ng/mL)  Date Value  12/03/2015 21.0  11/04/2015 7.6*   HEMOGLOBIN (g/dL)  Date Value  12/03/2015 12.5  11/05/2015 11.3*  11/03/2015 11.3*      Assessment & Plan:  70 year old female with a past medical history of GERD, IBS, diverticulosis, breast cancer status post excision, hypertension and hyperlipidemia who is here for follow-up in consultation for new iron deficiency anemia.  1. IDA -- Asymptomatic iron deficiency discovered by primary care. She's been taking  oral iron and her ferritin improved quickly and her hemoglobin normalized. We discussed iron deficiency anemia and the usual workup. Previous celiac panel was negative I'm going to repeat this. Upper endoscopy performed last year also unrevealing. She had normal colonoscopy 5 years ago. I recommended repeat colorectal  cancer screening today. After this discussion we will proceed with Cologuard testing. She understands that if Cologuard is positive she would need repeat colonoscopy and she would be agreeable. I recommend that she continue Integra therapy until she follows up with Dr. Nicki Reaper in August. If ferritin remains normal and improved I would then recommend discontinuation of oral iron and repeat iron studies and CBC 3 months later. If she becomes again iron deficient she would need endoscopies.  2. IBS -- improved with probiotic. She will continue VSL 3  25 minutes spent with the patient today. Greater than 50% was spent in counseling and coordination of care with the patient

## 2016-01-13 NOTE — Patient Instructions (Signed)
We have sent your demographic and insurance information to [employer]. They should contact you within the next week regarding your Cologuard (colon cancer screening) test. If you have not heard from them within the next week, please call our office at [phone].  Your physician has requested that you go to the basement for the following lab work before leaving today: Celiac panel  Continue VSL #3.  If you are age 71 or older, your body mass index should be between 23-30. Your Body mass index is 32.46 kg/(m^2). If this is out of the aforementioned range listed, please consider follow up with your Primary Care Provider.  If you are age 100 or younger, your body mass index should be between 19-25. Your Body mass index is 32.46 kg/(m^2). If this is out of the aformentioned range listed, please consider follow up with your Primary Care Provider.

## 2016-01-14 LAB — TISSUE TRANSGLUTAMINASE, IGA: Tissue Transglutaminase Ab, IgA: 1 U/mL (ref <4)

## 2016-02-04 ENCOUNTER — Other Ambulatory Visit: Payer: Self-pay

## 2016-02-04 ENCOUNTER — Telehealth: Payer: Self-pay | Admitting: Internal Medicine

## 2016-02-04 NOTE — Telephone Encounter (Signed)
See result note.  

## 2016-02-04 NOTE — Telephone Encounter (Signed)
Result printed out and result entered in epic and sent to Dr. Hilarie Fredrickson. Cologuard was abnormal. Result note sent to Dr. Hilarie Fredrickson.

## 2016-02-05 LAB — COLOGUARD: COLOGUARD: POSITIVE

## 2016-02-12 ENCOUNTER — Ambulatory Visit (AMBULATORY_SURGERY_CENTER): Payer: Self-pay

## 2016-02-12 VITALS — Ht 63.0 in | Wt 182.4 lb

## 2016-02-12 DIAGNOSIS — R195 Other fecal abnormalities: Secondary | ICD-10-CM

## 2016-02-12 MED ORDER — SUPREP BOWEL PREP KIT 17.5-3.13-1.6 GM/177ML PO SOLN: 1.0000 | Freq: Once | ORAL | Status: DC

## 2016-02-12 NOTE — Progress Notes (Signed)
No allergies to eggs or soy No past problems with anesthesia No home oxygen No diet meds  Has email and internet; declined emmi 

## 2016-02-16 ENCOUNTER — Other Ambulatory Visit: Payer: Self-pay | Admitting: Internal Medicine

## 2016-02-17 ENCOUNTER — Encounter: Payer: Self-pay | Admitting: Internal Medicine

## 2016-03-04 ENCOUNTER — Encounter: Payer: Self-pay | Admitting: Internal Medicine

## 2016-03-04 ENCOUNTER — Telehealth: Payer: Self-pay

## 2016-03-04 ENCOUNTER — Ambulatory Visit (AMBULATORY_SURGERY_CENTER): Payer: Medicare Other | Admitting: Internal Medicine

## 2016-03-04 ENCOUNTER — Other Ambulatory Visit: Payer: Medicare Other

## 2016-03-04 VITALS — BP 141/80 | HR 77 | Temp 98.4°F | Resp 15 | Ht 63.0 in | Wt 182.0 lb

## 2016-03-04 DIAGNOSIS — E7800 Pure hypercholesterolemia, unspecified: Secondary | ICD-10-CM

## 2016-03-04 DIAGNOSIS — D122 Benign neoplasm of ascending colon: Secondary | ICD-10-CM

## 2016-03-04 DIAGNOSIS — D649 Anemia, unspecified: Secondary | ICD-10-CM

## 2016-03-04 DIAGNOSIS — D125 Benign neoplasm of sigmoid colon: Secondary | ICD-10-CM

## 2016-03-04 DIAGNOSIS — R195 Other fecal abnormalities: Secondary | ICD-10-CM | POA: Diagnosis not present

## 2016-03-04 DIAGNOSIS — K635 Polyp of colon: Secondary | ICD-10-CM | POA: Diagnosis not present

## 2016-03-04 DIAGNOSIS — R739 Hyperglycemia, unspecified: Secondary | ICD-10-CM

## 2016-03-04 DIAGNOSIS — R7989 Other specified abnormal findings of blood chemistry: Secondary | ICD-10-CM

## 2016-03-04 DIAGNOSIS — R945 Abnormal results of liver function studies: Secondary | ICD-10-CM

## 2016-03-04 DIAGNOSIS — I10 Essential (primary) hypertension: Secondary | ICD-10-CM

## 2016-03-04 MED ORDER — SODIUM CHLORIDE 0.9 % IV SOLN: 500.0000 mL | INTRAVENOUS | Status: DC

## 2016-03-04 NOTE — Progress Notes (Signed)
Stable to RR 

## 2016-03-04 NOTE — Progress Notes (Signed)
Called to room to assist during endoscopic procedure.  Patient ID and intended procedure confirmed with present staff. Received instructions for my participation in the procedure from the performing physician.  

## 2016-03-04 NOTE — Op Note (Signed)
[facility] Patient Name: Helen Marshall Procedure Date: 03/04/2016 1:18 PM MRN: [id number] Endoscopist: Jerene Bears , MD Age: 70 Referring MD:  Date of Birth: 1946/04/16 Gender: Female Account #: [id number] Procedure:                Colonoscopy Indications:              This is the patient's first colonoscopy, Positive                            Cologuard test Medicines:                Monitored Anesthesia Care Procedure:                Pre-Anesthesia Assessment:                           - Prior to the procedure, a History and Physical                            was performed, and patient medications and                            allergies were reviewed. The patient's tolerance of                            previous anesthesia was also reviewed. The risks                            and benefits of the procedure and the sedation                            options and risks were discussed with the patient.                            All questions were answered, and informed consent                            was obtained. Prior Anticoagulants: The patient has                            taken no previous anticoagulant or antiplatelet                            agents. ASA Grade Assessment: III - A patient with                            severe systemic disease. After reviewing the risks                            and benefits, the patient was deemed in                            satisfactory condition to undergo the procedure.  After obtaining informed consent, the colonoscope                            was passed under direct vision. Throughout the                            procedure, the patient's blood pressure, pulse, and                            oxygen saturations were monitored continuously. The                            Model CF-HQ190L [id number]) scope was introduced                            through the anus and advanced to the the  cecum,                            identified by appendiceal orifice and ileocecal                            valve. The colonoscopy was performed without                            difficulty. The patient tolerated the procedure                            well. The quality of the bowel preparation was                            excellent. The ileocecal valve, appendiceal                            orifice, and rectum were photographed. Scope In: 1:37:04 PM Scope Out: 1:52:31 PM Scope Withdrawal Time: 0 hours 11 minutes 24 seconds  Total Procedure Duration: 0 hours 15 minutes 27 seconds  Findings:                 The perianal exam findings include non-thrombosed                            external hemorrhoids.                           A 4 mm polyp was found in the ascending colon. The                            polyp was sessile. The polyp was removed with a                            cold snare. Resection and retrieval were complete.                           A 2 mm polyp was found in the ascending colon. The  polyp was sessile. The polyp was removed with a                            cold biopsy forceps. Resection and retrieval were                            complete.                           A 4 mm polyp was found in the sigmoid colon. The                            polyp was sessile. The polyp was removed with a                            cold snare. Resection and retrieval were complete.                           Multiple small and large-mouthed diverticula were                            found in the recto-sigmoid colon and sigmoid colon.                           External hemorrhoids were found during retroflexion                            and during perianal exam. The hemorrhoids were                            small. Complications:            No immediate complications. Estimated Blood Loss:     Estimated blood loss was minimal. Estimated blood                             loss was minimal. Impression:               - One 4 mm polyp in the ascending colon, removed                            with a cold snare. Resected and retrieved.                           - One 2 mm polyp in the ascending colon, removed                            with a cold biopsy forceps. Resected and retrieved.                           - One 4 mm polyp in the sigmoid colon, removed with                            a cold snare. Resected and retrieved.                           -  Moderate diverticulosis in the recto-sigmoid                            colon and in the sigmoid colon.                           - External hemorrhoids. Recommendation:           - Patient has a contact number available for                            emergencies. The signs and symptoms of potential                            delayed complications were discussed with the                            patient. Return to normal activities tomorrow.                            Written discharge instructions were provided to the                            patient.                           - Continue present medications.                           - Await pathology results.                           - Repeat colonoscopy is recommended. The                            colonoscopy date will be determined after pathology                            results from today's exam become available for                            review.                           - Resume previous diet. Jerene Bears, MD 03/04/2016 1:57:02 PM This report has been signed electronically.

## 2016-03-04 NOTE — Telephone Encounter (Signed)
Pt coming for labs 03/05/16. Please place future orders. Thank you.

## 2016-03-04 NOTE — Telephone Encounter (Signed)
Labs ordered.

## 2016-03-05 ENCOUNTER — Telehealth: Payer: Self-pay

## 2016-03-05 ENCOUNTER — Other Ambulatory Visit (INDEPENDENT_AMBULATORY_CARE_PROVIDER_SITE_OTHER): Payer: Medicare Other

## 2016-03-05 ENCOUNTER — Encounter: Payer: Self-pay | Admitting: Internal Medicine

## 2016-03-05 DIAGNOSIS — D649 Anemia, unspecified: Secondary | ICD-10-CM

## 2016-03-05 DIAGNOSIS — R739 Hyperglycemia, unspecified: Secondary | ICD-10-CM

## 2016-03-05 DIAGNOSIS — I10 Essential (primary) hypertension: Secondary | ICD-10-CM

## 2016-03-05 DIAGNOSIS — R7989 Other specified abnormal findings of blood chemistry: Secondary | ICD-10-CM

## 2016-03-05 DIAGNOSIS — R945 Abnormal results of liver function studies: Secondary | ICD-10-CM

## 2016-03-05 DIAGNOSIS — E7800 Pure hypercholesterolemia, unspecified: Secondary | ICD-10-CM

## 2016-03-05 LAB — CBC WITH DIFFERENTIAL/PLATELET
BASOS PCT: 0.3 % (ref 0.0–3.0)
Basophils Absolute: 0 10*3/uL (ref 0.0–0.1)
EOS PCT: 1.9 % (ref 0.0–5.0)
Eosinophils Absolute: 0.1 10*3/uL (ref 0.0–0.7)
HCT: 39 % (ref 36.0–46.0)
Hemoglobin: 13.3 g/dL (ref 12.0–15.0)
LYMPHS ABS: 1.3 10*3/uL (ref 0.7–4.0)
Lymphocytes Relative: 22.3 % (ref 12.0–46.0)
MCHC: 34.2 g/dL (ref 30.0–36.0)
MCV: 91.1 fl (ref 78.0–100.0)
MONOS PCT: 8.6 % (ref 3.0–12.0)
Monocytes Absolute: 0.5 10*3/uL (ref 0.1–1.0)
NEUTROS ABS: 3.8 10*3/uL (ref 1.4–7.7)
Neutrophils Relative %: 66.9 % (ref 43.0–77.0)
Platelets: 196 10*3/uL (ref 150.0–400.0)
RBC: 4.28 Mil/uL (ref 3.87–5.11)
RDW: 15.2 % (ref 11.5–15.5)
WBC: 5.7 10*3/uL (ref 4.0–10.5)

## 2016-03-05 LAB — HEPATIC FUNCTION PANEL
ALT: 19 U/L (ref 0–35)
AST: 23 U/L (ref 0–37)
Albumin: 3.9 g/dL (ref 3.5–5.2)
Alkaline Phosphatase: 37 U/L — ABNORMAL LOW (ref 39–117)
BILIRUBIN DIRECT: 0.1 mg/dL (ref 0.0–0.3)
BILIRUBIN TOTAL: 0.5 mg/dL (ref 0.2–1.2)
Total Protein: 6.3 g/dL (ref 6.0–8.3)

## 2016-03-05 LAB — BASIC METABOLIC PANEL
BUN: 8 mg/dL (ref 6–23)
CO2: 30 meq/L (ref 19–32)
Calcium: 9.3 mg/dL (ref 8.4–10.5)
Chloride: 105 mEq/L (ref 96–112)
Creatinine, Ser: 0.68 mg/dL (ref 0.40–1.20)
GFR: 90.76 mL/min (ref >60.00)
GLUCOSE: 111 mg/dL — AB (ref 70–99)
POTASSIUM: 3.7 meq/L (ref 3.5–5.1)
SODIUM: 141 meq/L (ref 135–145)

## 2016-03-05 LAB — HEMOGLOBIN A1C: Hgb A1c MFr Bld: 5.8 % (ref 4.6–6.5)

## 2016-03-05 LAB — LIPID PANEL
CHOL/HDL RATIO: 2
CHOLESTEROL: 190 mg/dL (ref 0–200)
HDL: 77 mg/dL (ref >39.00)
LDL CALC: 95 mg/dL (ref 0–99)
NonHDL: 112.92
Triglycerides: 90 mg/dL (ref 0.0–149.0)
VLDL: 18 mg/dL (ref 0.0–40.0)

## 2016-03-05 LAB — FERRITIN: FERRITIN: 30.8 ng/mL (ref 10.0–291.0)

## 2016-03-05 LAB — TSH: TSH: 0.76 u[IU]/mL (ref 0.35–4.50)

## 2016-03-05 NOTE — Telephone Encounter (Signed)
  Follow up Call-  Call back number 03/04/2016 08/22/2014  Post procedure Call Back phone  # [phone]  Permission to leave phone message Yes Yes  Some recent data might be hidden    Patient was called for follow up after her procedure on 03/04/2016. No answer at the number given for follow up phone call. A message was left on the answering machine.

## 2016-03-08 ENCOUNTER — Encounter: Payer: Self-pay | Admitting: Internal Medicine

## 2016-03-08 ENCOUNTER — Ambulatory Visit (INDEPENDENT_AMBULATORY_CARE_PROVIDER_SITE_OTHER): Payer: Medicare Other | Admitting: Internal Medicine

## 2016-03-08 DIAGNOSIS — K5730 Diverticulosis of large intestine without perforation or abscess without bleeding: Secondary | ICD-10-CM | POA: Diagnosis not present

## 2016-03-08 DIAGNOSIS — D0512 Intraductal carcinoma in situ of left breast: Secondary | ICD-10-CM

## 2016-03-08 DIAGNOSIS — M519 Unspecified thoracic, thoracolumbar and lumbosacral intervertebral disc disorder: Secondary | ICD-10-CM

## 2016-03-08 DIAGNOSIS — R739 Hyperglycemia, unspecified: Secondary | ICD-10-CM | POA: Diagnosis not present

## 2016-03-08 DIAGNOSIS — E7800 Pure hypercholesterolemia, unspecified: Secondary | ICD-10-CM | POA: Diagnosis not present

## 2016-03-08 DIAGNOSIS — K219 Gastro-esophageal reflux disease without esophagitis: Secondary | ICD-10-CM

## 2016-03-08 DIAGNOSIS — D649 Anemia, unspecified: Secondary | ICD-10-CM

## 2016-03-08 DIAGNOSIS — I10 Essential (primary) hypertension: Secondary | ICD-10-CM

## 2016-03-08 MED ORDER — BENAZEPRIL HCL 40 MG PO TABS: 40.0000 mg | ORAL_TABLET | Freq: Every day | ORAL | 1 refills | Status: DC

## 2016-03-08 NOTE — Progress Notes (Signed)
Patient ID: Helen Marshall, female   DOB: 23-Sep-1945, 70 y.o.   MRN: [id number]   Subjective:    Patient ID: Helen Marshall, female    DOB: June 13, 1946, 70 y.o.   MRN: [id number]  HPI  Patient here for a scheduled follow up.  She states she is doing well.  Feels good. No chest pain.  No sob.  Just had colonoscopy 03/04/16.  Polyps removed.  Waiting on biopsy.  Had some minimal constipation right after her colonoscopy.  This has resolved.  Overall feels good.  Tries to stay active.  No chest pain.  No sob.  No acid reflux.  No abdominal pain or cramping.     Past Medical History:  Diagnosis Date  . Abnormal LFTs   . Abnormal mammogram 12/19/2012   left  . Allergy   . Anemia   . Cancer (HCC)    Breast  . Carotid bruit    left  . Cataract   . Colon polyps   . Diverticulosis   . Diverticulosis 2012  . Environmental allergies   . GERD (gastroesophageal reflux disease)   . Heart murmur   . Hypercholesterolemia   . Hyperglycemia   . Hypertension   . IBS (irritable bowel syndrome)   . Lipoma of colon   . Lumbar disc disease   . 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   Past Surgical History:  Procedure Laterality Date  . ABDOMINAL HYSTERECTOMY  1989   fibroids  . ANKLE SURGERY Left 1999  . BREAST SURGERY Left 1970   biopsy  . CHOLECYSTECTOMY  2001  . COLONOSCOPY  06/02/2011   Verdie Shire, MD; diverticulosis, submucosal lipoma of the sigmoid colon.  Marland Kitchen HERNIA REPAIR  2001, 2004  . HERNIA REPAIR  01/22/2013   Repeat repair of umbilical defect, 2.5 cm. Primary repair.  Marland Kitchen NASAL SINUS SURGERY  1997  . rotator cuff surgery  1998  . SQUAMOUS CELL CARCINOMA EXCISION Right 05/2013   shoulder  . TUBAL LIGATION  1976  . UPPER GI ENDOSCOPY  08/22/14   Dr Billie Lade   Family History  Problem Relation Age of Onset  . CVA Father   . Alcohol  abuse Father   . Heart disease Mother     myocardial infarction  . Diabetes Mother   . Hypertension Brother     x2  . Alcohol abuse Brother   . Arthritis/Rheumatoid Sister     x2  . Breast cancer Sister 75  . Osteoarthritis Sister   . Asthma Sister   . Hypothyroidism Sister   . Cancer Other     breast - paternal first cousin  . Colon cancer Maternal Uncle   . Colon polyps Son   . Colon polyps Sister    Social History   Social History  . Marital status: Married    Spouse name: N/A  . Number of children: 2  . Years of education: N/A   Occupational History  . Retired    Social History Main Topics  . Smoking status: Never Smoker  . Smokeless tobacco: Never Used  . Alcohol use No  . Drug use: No  . Sexual activity: No   Other Topics Concern  . None   Social History Narrative  . None    Outpatient Encounter Prescriptions as of 03/08/2016  Medication Sig  .  amLODipine (NORVASC) 5 MG tablet TAKE 1 TABLET BY MOUTH ONCE DAILY.  . benazepril (LOTENSIN) 40 MG tablet Take 1 tablet (40 mg total) by mouth daily.  . calcium-vitamin D (CALCIUM 500+D) 500-400 MG-UNIT per tablet Take 1 tablet by mouth daily.   Marland Kitchen EPIPEN 2-PAK 0.3 MG/0.3ML SOAJ injection   . Fe Fum-FePoly-Vit C-Vit B3 (INTEGRA) 62.5-62.5-40-3 MG CAPS Take 1 capsule by mouth daily.  . fish oil-omega-3 fatty acids 1000 MG capsule 5 (five) times daily.   . fluticasone (FLONASE) 50 MCG/ACT nasal spray Place 2 sprays into both nostrils as needed.  . hydrochlorothiazide (HYDRODIURIL) 25 MG tablet TAKE 1 TABLET BY MOUTH ONCE DAILY.  . Multiple Vitamin (MULTIVITAMIN) tablet Take 1 tablet by mouth daily.  . Multiple Vitamins-Minerals (EYE VITAMINS PO) Take by mouth.  . niacin (NIASPAN) 1000 MG CR tablet Take 1,000 mg by mouth 2 (two) times daily.  . potassium chloride (K-DUR) 10 MEQ tablet TAKE 1 TABLET BY MOUTH TWICE DAILY.  Marland Kitchen PROAIR HFA 108 (90 Base) MCG/ACT inhaler   . Probiotic Product (VSL#3 PO) Take by mouth. 1  capsul daily  . ranitidine (ZANTAC) 150 MG tablet TAKE 1 TABLET BY MOUTH TWICE DAILY AS NEEDED FOR HEARTBURN.  . tamoxifen (NOLVADEX) 20 MG tablet Take 1 tablet (20 mg total) by mouth daily.  Marland Kitchen triamcinolone cream (KENALOG) 0.1 % Apply 1 application topically as needed.   . [DISCONTINUED] benazepril (LOTENSIN) 40 MG tablet TAKE 1 TABLET BY MOUTH ONCE DAILY.  . [DISCONTINUED] PROLENSA 0.07 % SOLN    Facility-Administered Encounter Medications as of 03/08/2016  Medication  . 0.9 %  sodium chloride infusion    Review of Systems  Constitutional: Negative for appetite change and unexpected weight change.  HENT: Negative for congestion and sinus pressure.   Respiratory: Negative for cough, chest tightness and shortness of breath.   Cardiovascular: Negative for chest pain, palpitations and leg swelling.  Gastrointestinal: Negative for abdominal pain, diarrhea, nausea and vomiting.  Genitourinary: Negative for difficulty urinating and dysuria.  Musculoskeletal: Positive for back pain.       Intermittent.  Resolves with rest.  Sees chiropractor.  Does not desire any further intervention at this time.    Skin: Negative for color change and rash.  Neurological: Negative for dizziness, light-headedness and headaches.  Psychiatric/Behavioral: Negative for agitation and dysphoric mood.       Objective:     Blood pressure rechecked by me:  128/68  Physical Exam  Constitutional: She appears well-nourished. No distress.  HENT:  Nose: Nose normal.  Mouth/Throat: Oropharynx is clear and moist.  Neck: Neck supple. No thyromegaly present.  Cardiovascular: Normal rate and regular rhythm.   Pulmonary/Chest: Breath sounds normal. No respiratory distress. She has no wheezes.  Abdominal: Soft. Bowel sounds are normal. There is no tenderness.  Musculoskeletal: She exhibits no edema or tenderness.  Lymphadenopathy:    She has no cervical adenopathy.  Skin: No rash noted. No erythema.  Psychiatric: She  has a normal mood and affect. Her behavior is normal.    BP 128/68   Pulse 80   Temp 98 F (36.7 C)   Resp 16   Wt 182 lb (82.6 kg)   LMP 07/21/1988   BMI 32.24 kg/m  Wt Readings from Last 3 Encounters:  03/08/16 182 lb (82.6 kg)  03/04/16 182 lb (82.6 kg)  02/12/16 182 lb 6.4 oz (82.7 kg)     Lab Results  Component Value Date   WBC 5.7 03/05/2016   HGB  13.3 03/05/2016   HCT 39.0 03/05/2016   PLT 196.0 03/05/2016   GLUCOSE 111 (H) 03/05/2016   CHOL 190 03/05/2016   TRIG 90.0 03/05/2016   HDL 77.00 03/05/2016   LDLDIRECT 101.4 05/07/2013   LDLCALC 95 03/05/2016   ALT 19 03/05/2016   AST 23 03/05/2016   NA 141 03/05/2016   K 3.7 03/05/2016   CL 105 03/05/2016   CREATININE 0.68 03/05/2016   BUN 8 03/05/2016   CO2 30 03/05/2016   TSH 0.76 03/05/2016   HGBA1C 5.8 03/05/2016   MICROALBUR 0.5 01/16/2014        Assessment & Plan:   Problem List Items Addressed This Visit    Anemia    Recent cbc revealed normal hgb and ferritin levels increased.  Change integra to qod.  Follow cbc and ferritin.        Relevant Orders   CBC with Differential/Platelet   Ferritin   Diverticulosis    Bowels stable.  Just had colonoscopy 03/2016.        GERD (gastroesophageal reflux disease)    No upper symptoms reported.       Hypercholesterolemia    Low cholesterol diet and exercise.  Follow lipid panel.   Lab Results  Component Value Date   CHOL 190 03/05/2016   HDL 77.00 03/05/2016   LDLCALC 95 03/05/2016   LDLDIRECT 101.4 05/07/2013   TRIG 90.0 03/05/2016   CHOLHDL 2 03/05/2016        Relevant Medications   benazepril (LOTENSIN) 40 MG tablet   Other Relevant Orders   Lipid panel   Hepatic function panel   Hyperglycemia    Low carb diet and exercise.  Follow met b and a1c.   Lab Results  Component Value Date   HGBA1C 5.8 03/05/2016        Relevant Orders   Hemoglobin A1c   Hypertension    Blood pressure on recheck today improved.  Continue same  medication regimen.  Follow pressures.  Follow metabolic panel.       Relevant Medications   benazepril (LOTENSIN) 40 MG tablet   Other Relevant Orders   Basic metabolic panel   Lumbar disc disease    Has previously been evaluated by ortho.  Some minimal back pain recently.  See note.  Desires no further intervention.  Follow.       Neoplasm of left breast, primary tumor 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)    On tamoxifen.  Sees Dr Bary Castilla.  Mammogram 08/19/15 - birads II.       Other Visit Diagnoses   None.      Einar Pheasant, MD

## 2016-03-08 NOTE — Progress Notes (Signed)
Pre visit review using our clinic review tool, if applicable. No additional management support is needed unless otherwise documented below in the visit note. 

## 2016-03-09 ENCOUNTER — Encounter: Payer: Self-pay | Admitting: Internal Medicine

## 2016-03-09 NOTE — Assessment & Plan Note (Signed)
Recent cbc revealed normal hgb and ferritin levels increased.  Change integra to qod.  Follow cbc and ferritin.

## 2016-03-09 NOTE — Assessment & Plan Note (Signed)
Blood pressure on recheck today improved.  Continue same medication regimen.  Follow pressures.  Follow metabolic panel.

## 2016-03-09 NOTE — Assessment & Plan Note (Signed)
Low carb diet and exercise.  Follow met b and a1c.   Lab Results  Component Value Date   HGBA1C 5.8 03/05/2016

## 2016-03-09 NOTE — Assessment & Plan Note (Signed)
Bowels stable.  Just had colonoscopy 03/2016.

## 2016-03-09 NOTE — Assessment & Plan Note (Signed)
Has previously been evaluated by ortho.  Some minimal back pain recently.  See note.  Desires no further intervention.  Follow.

## 2016-03-09 NOTE — Assessment & Plan Note (Signed)
On tamoxifen.  Sees Dr Bary Castilla.  Mammogram 08/19/15 - birads II.

## 2016-03-09 NOTE — Assessment & Plan Note (Signed)
Low cholesterol diet and exercise.  Follow lipid panel.   Lab Results  Component Value Date   CHOL 190 03/05/2016   HDL 77.00 03/05/2016   LDLCALC 95 03/05/2016   LDLDIRECT 101.4 05/07/2013   TRIG 90.0 03/05/2016   CHOLHDL 2 03/05/2016

## 2016-03-09 NOTE — Assessment & Plan Note (Signed)
No upper symptoms reported.   

## 2016-03-15 ENCOUNTER — Encounter: Payer: Self-pay | Admitting: Internal Medicine

## 2016-05-15 ENCOUNTER — Ambulatory Visit (INDEPENDENT_AMBULATORY_CARE_PROVIDER_SITE_OTHER): Payer: Medicare Other

## 2016-05-15 DIAGNOSIS — Z23 Encounter for immunization: Secondary | ICD-10-CM

## 2016-06-29 ENCOUNTER — Other Ambulatory Visit: Payer: Self-pay | Admitting: Internal Medicine

## 2016-07-12 ENCOUNTER — Other Ambulatory Visit: Payer: Medicare Other

## 2016-07-12 ENCOUNTER — Ambulatory Visit (INDEPENDENT_AMBULATORY_CARE_PROVIDER_SITE_OTHER): Payer: Medicare Other

## 2016-07-12 VITALS — BP 122/68 | HR 76 | Temp 98.3°F | Resp 14 | Ht 63.0 in | Wt 181.1 lb

## 2016-07-12 DIAGNOSIS — D649 Anemia, unspecified: Secondary | ICD-10-CM | POA: Diagnosis not present

## 2016-07-12 DIAGNOSIS — I10 Essential (primary) hypertension: Secondary | ICD-10-CM

## 2016-07-12 DIAGNOSIS — R739 Hyperglycemia, unspecified: Secondary | ICD-10-CM | POA: Diagnosis not present

## 2016-07-12 DIAGNOSIS — E7800 Pure hypercholesterolemia, unspecified: Secondary | ICD-10-CM | POA: Diagnosis not present

## 2016-07-12 DIAGNOSIS — Z0000 Encounter for general adult medical examination without abnormal findings: Secondary | ICD-10-CM

## 2016-07-12 LAB — CBC WITH DIFFERENTIAL/PLATELET
BASOS ABS: 0 10*3/uL (ref 0.0–0.1)
Basophils Relative: 0.8 % (ref 0.0–3.0)
EOS ABS: 0.2 10*3/uL (ref 0.0–0.7)
Eosinophils Relative: 3.5 % (ref 0.0–5.0)
HCT: 40.1 % (ref 36.0–46.0)
Hemoglobin: 13.7 g/dL (ref 12.0–15.0)
LYMPHS ABS: 1.6 10*3/uL (ref 0.7–4.0)
Lymphocytes Relative: 32.3 % (ref 12.0–46.0)
MCHC: 34.2 g/dL (ref 30.0–36.0)
MCV: 92 fl (ref 78.0–100.0)
MONO ABS: 0.5 10*3/uL (ref 0.1–1.0)
Monocytes Relative: 9.1 % (ref 3.0–12.0)
NEUTROS ABS: 2.7 10*3/uL (ref 1.4–7.7)
NEUTROS PCT: 54.3 % (ref 43.0–77.0)
PLATELETS: 205 10*3/uL (ref 150.0–400.0)
RBC: 4.36 Mil/uL (ref 3.87–5.11)
RDW: 13.4 % (ref 11.5–15.5)
WBC: 5 10*3/uL (ref 4.0–10.5)

## 2016-07-12 LAB — HEPATIC FUNCTION PANEL
ALK PHOS: 50 U/L (ref 39–117)
ALT: 21 U/L (ref 0–35)
AST: 25 U/L (ref 0–37)
Albumin: 4.1 g/dL (ref 3.5–5.2)
BILIRUBIN DIRECT: 0.1 mg/dL (ref 0.0–0.3)
TOTAL PROTEIN: 6.4 g/dL (ref 6.0–8.3)
Total Bilirubin: 0.4 mg/dL (ref 0.2–1.2)

## 2016-07-12 LAB — BASIC METABOLIC PANEL
BUN: 12 mg/dL (ref 6–23)
CALCIUM: 9.3 mg/dL (ref 8.4–10.5)
CHLORIDE: 102 meq/L (ref 96–112)
CO2: 29 mEq/L (ref 19–32)
CREATININE: 0.67 mg/dL (ref 0.40–1.20)
GFR: 92.23 mL/min (ref >60.00)
Glucose, Bld: 115 mg/dL — ABNORMAL HIGH (ref 70–99)
Potassium: 4 mEq/L (ref 3.5–5.1)
Sodium: 140 mEq/L (ref 135–145)

## 2016-07-12 LAB — LIPID PANEL
CHOL/HDL RATIO: 3
CHOLESTEROL: 207 mg/dL — AB (ref 0–200)
HDL: 76.9 mg/dL (ref >39.00)
LDL Cholesterol: 109 mg/dL — ABNORMAL HIGH (ref 0–99)
NonHDL: 129.93
TRIGLYCERIDES: 107 mg/dL (ref 0.0–149.0)
VLDL: 21.4 mg/dL (ref 0.0–40.0)

## 2016-07-12 LAB — HEMOGLOBIN A1C: HEMOGLOBIN A1C: 5.8 % (ref 4.6–6.5)

## 2016-07-12 LAB — FERRITIN: FERRITIN: 33.7 ng/mL (ref 10.0–291.0)

## 2016-07-12 NOTE — Progress Notes (Signed)
Subjective:   Helen Marshall is a 70 y.o. female who presents for Medicare Annual (Subsequent) preventive examination.  Review of Systems:  No ROS.  Medicare Wellness Visit.  Cardiac Risk Factors include: advanced age (>78men, >34 women);hypertension;obesity (BMI >30kg/m2)     Objective:     Vitals: BP 122/68 (BP Location: Left Arm, Patient Position: Sitting, Cuff Size: Normal)   Pulse 76   Temp 98.3 F (36.8 C) (Oral)   Resp 14   Ht 5\' 3"  (1.6 m)   Wt 181 lb 1.9 oz (82.2 kg)   LMP 07/21/1988   SpO2 96%   BMI 32.08 kg/m   Body mass index is 32.08 kg/m.   Tobacco History  Smoking Status  . Never Smoker  Smokeless Tobacco  . Never Used     Counseling given: Not Answered   Past Medical History:  Diagnosis Date  . Abnormal LFTs   . Abnormal mammogram 12/19/2012   left  . Allergy   . Anemia   . Cancer (HCC)    Breast  . Carotid bruit    left  . Cataract   . Colon polyps   . Diverticulosis   . Diverticulosis 2012  . Environmental allergies   . GERD (gastroesophageal reflux disease)   . Heart murmur   . Hypercholesterolemia   . Hyperglycemia   . Hypertension   . IBS (irritable bowel syndrome)   . Lipoma of colon   . Lumbar disc disease   . 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   Past Surgical History:  Procedure Laterality Date  . ABDOMINAL HYSTERECTOMY  1989   fibroids  . ANKLE SURGERY Left 1999  . BREAST SURGERY Left 1970   biopsy  . CHOLECYSTECTOMY  2001  . COLONOSCOPY  06/02/2011   Verdie Shire, MD; diverticulosis, submucosal lipoma of the sigmoid colon.  Marland Kitchen HERNIA REPAIR  2001, 2004  . HERNIA REPAIR  01/22/2013   Repeat repair of umbilical defect, 2.5 cm. Primary repair.  Marland Kitchen NASAL SINUS SURGERY  1997  . rotator cuff surgery  1998  . SQUAMOUS CELL CARCINOMA EXCISION Right 05/2013   shoulder  .  TUBAL LIGATION  1976  . UPPER GI ENDOSCOPY  08/22/14   Dr Billie Lade   Family History  Problem Relation Age of Onset  . CVA Father   . Alcohol abuse Father   . Heart disease Mother     myocardial infarction  . Diabetes Mother   . Hypertension Brother     x2  . Alcohol abuse Brother   . Arthritis/Rheumatoid Sister     x2  . Breast cancer Sister 69  . Osteoarthritis Sister   . Asthma Sister   . Hypothyroidism Sister   . Cancer Other     breast - paternal first cousin  . Colon cancer Maternal Uncle   . Colon polyps Son   . Colon polyps Sister    History  Sexual Activity  . Sexual activity: No    Outpatient Encounter Prescriptions as of 07/12/2016  Medication Sig  . amLODipine (NORVASC) 5 MG tablet TAKE 1 TABLET BY MOUTH ONCE DAILY.  . benazepril (LOTENSIN) 40 MG tablet Take 1 tablet (40 mg total) by mouth daily.  . calcium-vitamin D (CALCIUM 500+D) 500-400 MG-UNIT per tablet Take 1 tablet by mouth daily.   Marland Kitchen EPIPEN 2-PAK 0.3 MG/0.3ML  SOAJ injection   . Fe Fum-FePoly-Vit C-Vit B3 (INTEGRA) 62.5-62.5-40-3 MG CAPS TAKE 1 CAPSULE BY MOUTH ONCE DAILY  . fish oil-omega-3 fatty acids 1000 MG capsule 5 (five) times daily.   . fluticasone (FLONASE) 50 MCG/ACT nasal spray Place 2 sprays into both nostrils as needed.  . hydrochlorothiazide (HYDRODIURIL) 25 MG tablet TAKE 1 TABLET BY MOUTH ONCE DAILY.  . Multiple Vitamin (MULTIVITAMIN) tablet Take 1 tablet by mouth daily.  . Multiple Vitamins-Minerals (EYE VITAMINS PO) Take by mouth.  . niacin (NIASPAN) 1000 MG CR tablet Take 1,000 mg by mouth 2 (two) times daily.  . potassium chloride (K-DUR) 10 MEQ tablet TAKE 1 TABLET BY MOUTH TWICE DAILY.  Marland Kitchen PROAIR HFA 108 (90 Base) MCG/ACT inhaler   . Probiotic Product (VSL#3 PO) Take by mouth. 1 capsul daily  . ranitidine (ZANTAC) 150 MG tablet TAKE 1 TABLET BY MOUTH TWICE DAILY AS NEEDED FOR HEARTBURN.  . tamoxifen (NOLVADEX) 20 MG tablet Take 1 tablet (20 mg total) by mouth daily.  Marland Kitchen  triamcinolone cream (KENALOG) 0.1 % Apply 1 application topically as needed.    Facility-Administered Encounter Medications as of 07/12/2016  Medication  . 0.9 %  sodium chloride infusion    Activities of Daily Living In your present state of health, do you have any difficulty performing the following activities: 07/12/2016 07/17/2015  Hearing? N N  Vision? N N  Difficulty concentrating or making decisions? N N  Walking or climbing stairs? Y N  Dressing or bathing? N N  Doing errands, shopping? N N  Preparing Food and eating ? N N  Using the Toilet? N N  In the past six months, have you accidently leaked urine? N N  Do you have problems with loss of bowel control? N N  Managing your Medications? N N  Managing your Finances? N N  Housekeeping or managing your Housekeeping? N N  Some recent data might be hidden    Patient Care Team: Einar Pheasant, MD as PCP - General (Internal Medicine) Robert Bellow, MD (General Surgery)    Assessment:    This is a routine wellness examination for [employer]. The goal of the wellness visit is to assist the patient how to close the gaps in care and create a preventative care plan for the patient.   Taking calcium VIT D as appropriate/Osteoporosis risk reviewed.  Medications reviewed; taking without issues or barriers.  Safety issues reviewed; lives with husband. Smoke detectors in the home. No firearms in the home. Wears seatbelts when driving or riding with others. No violence in the home.  No identified risk were noted; The patient was oriented x 3; appropriate in dress and manner and no objective failures at ADL's or IADL's.   BMI; discussed the importance of a healthy diet, water intake and the benefits of regular aerobic exercise.  Educational material provided.  HTN; well controlled and managed with medication.  Stable and followed by PCP.  Labs completed.  Health maintenance gaps; closed.  Patient Concerns: None at this  time. Follow up with PCP as needed.  Exercise Activities and Dietary recommendations Current Exercise Habits: Home exercise routine (Active working in the home.), Frequency (Times/Week): 4, Intensity: Mild  Goals    . Increase physical activity          Stay active and exercise.  Chair exercises as demonstrated.  Increase as tolerated.  Education provided.        Fall Risk Fall Risk  07/12/2016 11/05/2015 07/17/2015 11/08/2014 09/13/2013  Falls in the past year? No No No No No   Depression Screen PHQ 2/9 Scores 07/12/2016 11/05/2015 07/17/2015 11/08/2014  PHQ - 2 Score 0 0 0 0     Cognitive Function MMSE - Mini Mental State Exam 07/12/2016 07/17/2015  Orientation to time 5 5  Orientation to Place 5 5  Registration 3 3  Attention/ Calculation 5 5  Recall 3 3  Language- name 2 objects 2 2  Language- repeat 1 1  Language- follow 3 step command 3 3  Language- read & follow direction 1 1  Write a sentence 1 1  Copy design 1 1  Total score 30 30        Immunization History  Administered Date(s) Administered  . Influenza Split 05/16/2012  . Influenza, High Dose Seasonal PF 05/15/2016  . Influenza,inj,Quad PF,36+ Mos 05/09/2013, 05/23/2014, 07/03/2015  . Pneumococcal Conjugate-13 09/13/2013  . Pneumococcal Polysaccharide-23 11/08/2014  . Tdap 04/26/2014  . Zoster 08/02/2009   Screening Tests Health Maintenance  Topic Date Due  . MAMMOGRAM  08/18/2016  . COLONOSCOPY  03/04/2021  . TETANUS/TDAP  04/26/2024  . INFLUENZA VACCINE  Completed  . DEXA SCAN  Completed  . ZOSTAVAX  Completed  . Hepatitis C Screening  Completed  . PNA vac Low Risk Adult  Completed      Plan:    End of life planning; Advance aging; Advanced directives discussed. Copy of current HCPOA/Living Will requested.  Medicare Attestation I have personally reviewed: The patient's medical and social history Their use of alcohol, tobacco or illicit drugs Their current medications and supplements The  patient's functional ability including ADLs,fall risks, home safety risks, cognitive, and hearing and visual impairment Diet and physical activities Evidence for depression   The patient's weight, height, BMI, and visual acuity have been recorded in the chart.  I have made referrals and provided education to the patient based on review of the above and I have provided the patient with a written personalized care plan for preventive services.    During the course of the visit the patient was educated and counseled about the following appropriate screening and preventive services:   Vaccines to include Pneumoccal, Influenza, Hepatitis B, Td, Zostavax, HCV  Electrocardiogram  Cardiovascular Disease  Colorectal cancer screening  Bone density screening  Diabetes screening  Glaucoma screening  Mammography/PAP  Nutrition counseling   Patient Instructions (the written plan) was given to the patient.   Varney Biles, LPN  [id number]    Reviewed above information.  Agree with plan.   Dr Nicki Reaper

## 2016-07-12 NOTE — Patient Instructions (Addendum)
  [name] , Thank you for taking time to come for your Medicare Wellness Visit. I appreciate your ongoing commitment to your health goals. Please review the following plan we discussed and let me know if I can assist you in the future.   Follow up with Dr. Nicki Reaper as needed.  These are the goals we discussed: Goals    . Increase physical activity          Stay active and exercise.  Chair exercises as demonstrated.  Increase as tolerated.  Education provided.         This is a list of the screening recommended for you and due dates:  Health Maintenance  Topic Date Due  . Mammogram  08/18/2016  . Colon Cancer Screening  03/04/2021  . Tetanus Vaccine  04/26/2024  . Flu Shot  Completed  . DEXA scan (bone density measurement)  Completed  . Shingles Vaccine  Completed  .  Hepatitis C: One time screening is recommended by Center for Disease Control  (CDC) for  adults born from [birth year] through 1965.   Completed  . Pneumonia vaccines  Completed

## 2016-07-13 ENCOUNTER — Encounter: Payer: Self-pay | Admitting: Internal Medicine

## 2016-07-15 ENCOUNTER — Ambulatory Visit (INDEPENDENT_AMBULATORY_CARE_PROVIDER_SITE_OTHER): Payer: Medicare Other | Admitting: Internal Medicine

## 2016-07-15 ENCOUNTER — Encounter: Payer: Self-pay | Admitting: Internal Medicine

## 2016-07-15 DIAGNOSIS — R739 Hyperglycemia, unspecified: Secondary | ICD-10-CM

## 2016-07-15 DIAGNOSIS — D0512 Intraductal carcinoma in situ of left breast: Secondary | ICD-10-CM | POA: Diagnosis not present

## 2016-07-15 DIAGNOSIS — I10 Essential (primary) hypertension: Secondary | ICD-10-CM | POA: Diagnosis not present

## 2016-07-15 DIAGNOSIS — R7989 Other specified abnormal findings of blood chemistry: Secondary | ICD-10-CM

## 2016-07-15 DIAGNOSIS — D649 Anemia, unspecified: Secondary | ICD-10-CM

## 2016-07-15 DIAGNOSIS — E7800 Pure hypercholesterolemia, unspecified: Secondary | ICD-10-CM | POA: Diagnosis not present

## 2016-07-15 DIAGNOSIS — K219 Gastro-esophageal reflux disease without esophagitis: Secondary | ICD-10-CM

## 2016-07-15 DIAGNOSIS — R945 Abnormal results of liver function studies: Secondary | ICD-10-CM

## 2016-07-15 NOTE — Progress Notes (Signed)
Patient ID: Helen Marshall, female   DOB: October 05, 1945, 70 y.o.   MRN: [id number]   Subjective:    Patient ID: Helen Marshall, female    DOB: 11-May-1946, 70 y.o.   MRN: [id number]  HPI  Patient here for a scheduled follow up.  She reports she is doing relatively well.  Some increased stress, but she feels she is handling things relatively well.  No chest pain.  No sob.  No acid reflux.  Bowels stable.  No abdominal pain or cramping.  Discussed lab results.  LDL 109.  a1c 5.8.  Discussed diet and exercise.  Discussed fall risks and prevention.    Past Medical History:  Diagnosis Date  . Abnormal LFTs   . Abnormal mammogram 12/19/2012   left  . Allergy   . Anemia   . Cancer (HCC)    Breast  . Carotid bruit    left  . Cataract   . Colon polyps   . Diverticulosis   . Diverticulosis 2012  . Environmental allergies   . GERD (gastroesophageal reflux disease)   . Heart murmur   . Hypercholesterolemia   . Hyperglycemia   . Hypertension   . IBS (irritable bowel syndrome)   . Lipoma of colon   . Lumbar disc disease   . 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   Past Surgical History:  Procedure Laterality Date  . ABDOMINAL HYSTERECTOMY  1989   fibroids  . ANKLE SURGERY Left 1999  . BREAST SURGERY Left 1970   biopsy  . CHOLECYSTECTOMY  2001  . COLONOSCOPY  06/02/2011   Verdie Shire, MD; diverticulosis, submucosal lipoma of the sigmoid colon.  Marland Kitchen HERNIA REPAIR  2001, 2004  . HERNIA REPAIR  01/22/2013   Repeat repair of umbilical defect, 2.5 cm. Primary repair.  Marland Kitchen NASAL SINUS SURGERY  1997  . rotator cuff surgery  1998  . SQUAMOUS CELL CARCINOMA EXCISION Right 05/2013   shoulder  . TUBAL LIGATION  1976  . UPPER GI ENDOSCOPY  08/22/14   Dr Billie Lade   Family History  Problem Relation Age of Onset  . CVA Father   . Alcohol abuse  Father   . Heart disease Mother     myocardial infarction  . Diabetes Mother   . Hypertension Brother     x2  . Alcohol abuse Brother   . Arthritis/Rheumatoid Sister     x2  . Breast cancer Sister 31  . Osteoarthritis Sister   . Asthma Sister   . Hypothyroidism Sister   . Cancer Other     breast - paternal first cousin  . Colon cancer Maternal Uncle   . Colon polyps Son   . Colon polyps Sister    Social History   Social History  . Marital status: Married    Spouse name: N/A  . Number of children: 2  . Years of education: N/A   Occupational History  . Retired    Social History Main Topics  . Smoking status: Never Smoker  . Smokeless tobacco: Never Used  . Alcohol use No  . Drug use: No  . Sexual activity: No   Other Topics Concern  . None   Social History Narrative  . None    Outpatient Encounter Prescriptions as of 07/15/2016  Medication Sig  . amLODipine (NORVASC) 5 MG tablet TAKE  1 TABLET BY MOUTH ONCE DAILY.  . benazepril (LOTENSIN) 40 MG tablet Take 1 tablet (40 mg total) by mouth daily.  . calcium-vitamin D (CALCIUM 500+D) 500-400 MG-UNIT per tablet Take 1 tablet by mouth daily.   Marland Kitchen EPIPEN 2-PAK 0.3 MG/0.3ML SOAJ injection   . Fe Fum-FePoly-Vit C-Vit B3 (INTEGRA) 62.5-62.5-40-3 MG CAPS TAKE 1 CAPSULE BY MOUTH ONCE DAILY  . fish oil-omega-3 fatty acids 1000 MG capsule 5 (five) times daily.   . fluticasone (FLONASE) 50 MCG/ACT nasal spray Place 2 sprays into both nostrils as needed.  . hydrochlorothiazide (HYDRODIURIL) 25 MG tablet TAKE 1 TABLET BY MOUTH ONCE DAILY.  . Multiple Vitamin (MULTIVITAMIN) tablet Take 1 tablet by mouth daily.  . Multiple Vitamins-Minerals (EYE VITAMINS PO) Take by mouth.  . niacin (NIASPAN) 1000 MG CR tablet Take 1,000 mg by mouth 2 (two) times daily.  . potassium chloride (K-DUR) 10 MEQ tablet TAKE 1 TABLET BY MOUTH TWICE DAILY.  Marland Kitchen PROAIR HFA 108 (90 Base) MCG/ACT inhaler   . Probiotic Product (VSL#3 PO) Take by mouth. 1  capsul daily  . ranitidine (ZANTAC) 150 MG tablet TAKE 1 TABLET BY MOUTH TWICE DAILY AS NEEDED FOR HEARTBURN.  . tamoxifen (NOLVADEX) 20 MG tablet Take 1 tablet (20 mg total) by mouth daily.  Marland Kitchen triamcinolone cream (KENALOG) 0.1 % Apply 1 application topically as needed.    Facility-Administered Encounter Medications as of 07/15/2016  Medication  . 0.9 %  sodium chloride infusion    Review of Systems  Constitutional: Negative for appetite change and unexpected weight change.  HENT: Negative for congestion and sinus pressure.   Respiratory: Negative for cough, chest tightness and shortness of breath.   Cardiovascular: Negative for chest pain, palpitations and leg swelling.  Gastrointestinal: Negative for abdominal pain, diarrhea, nausea and vomiting.  Genitourinary: Negative for difficulty urinating and dysuria.  Musculoskeletal: Negative for back pain and joint swelling.  Skin: Negative for color change and rash.  Neurological: Negative for dizziness, light-headedness and headaches.  Psychiatric/Behavioral: Negative for agitation and dysphoric mood.       Objective:    Physical Exam  Constitutional: She appears well-developed and well-nourished. No distress.  HENT:  Nose: Nose normal.  Mouth/Throat: Oropharynx is clear and moist.  Neck: Neck supple. No thyromegaly present.  Cardiovascular: Normal rate and regular rhythm.   1/6 systolic murmur.    Pulmonary/Chest: Breath sounds normal. No respiratory distress. She has no wheezes.  Abdominal: Soft. Bowel sounds are normal. There is no tenderness.  Musculoskeletal: She exhibits no edema or tenderness.  Lymphadenopathy:    She has no cervical adenopathy.  Skin: No rash noted. No erythema.  Psychiatric: She has a normal mood and affect. Her behavior is normal.    BP 100/62   Pulse 62   Wt 182 lb (82.6 kg)   LMP 07/21/1988   SpO2 97%   BMI 32.24 kg/m  Wt Readings from Last 3 Encounters:  07/15/16 182 lb (82.6 kg)    07/12/16 181 lb 1.9 oz (82.2 kg)  03/08/16 182 lb (82.6 kg)     Lab Results  Component Value Date   WBC 5.0 07/12/2016   HGB 13.7 07/12/2016   HCT 40.1 07/12/2016   PLT 205.0 07/12/2016   GLUCOSE 115 (H) 07/12/2016   CHOL 207 (H) 07/12/2016   TRIG 107.0 07/12/2016   HDL 76.90 07/12/2016   LDLDIRECT 101.4 05/07/2013   LDLCALC 109 (H) 07/12/2016   ALT 21 07/12/2016   AST 25 07/12/2016   NA  140 07/12/2016   K 4.0 07/12/2016   CL 102 07/12/2016   CREATININE 0.67 07/12/2016   BUN 12 07/12/2016   CO2 29 07/12/2016   TSH 0.76 03/05/2016   HGBA1C 5.8 07/12/2016   MICROALBUR 0.5 01/16/2014       Assessment & Plan:   Problem List Items Addressed This Visit    Abnormal liver function test    Liver panel checked 07/12/16 - wnl.       Relevant Orders   Hepatic function panel   Anemia    Recent hgb and iron stores wnl.  Will change her iron supplement to two days per week.  Follow cbc and ferritin.       Relevant Orders   CBC with Differential/Platelet   Ferritin   GERD (gastroesophageal reflux disease)    No upper symptoms reported.  Controlled.       Hypercholesterolemia    Low cholesterol diet and exercise.  LDL 109 on recent check.  Follow lipid panel.        Relevant Orders   Lipid panel   Hyperglycemia    Low carb diet and exercise.  Follow met b and a1c.  a1c just checked 5.8.       Relevant Orders   Hemoglobin A1c   Hypertension    Blood pressure under good control.  Continue same medication regimen.  Follow pressures.  Follow metabolic panel.        Relevant Orders   Basic metabolic panel   Neoplasm of left breast, primary tumor 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)    On tamoxifen.  Followed by Dr Bary Castilla.  Mammogram 08/19/15 - [facility].            Einar Pheasant, MD

## 2016-07-18 ENCOUNTER — Encounter: Payer: Self-pay | Admitting: Internal Medicine

## 2016-07-18 NOTE — Assessment & Plan Note (Signed)
Low carb diet and exercise.  Follow met b and a1c.  a1c just checked 5.8.   

## 2016-07-18 NOTE — Assessment & Plan Note (Signed)
Blood pressure under good control.  Continue same medication regimen.  Follow pressures.  Follow metabolic panel.   

## 2016-07-18 NOTE — Assessment & Plan Note (Signed)
Liver panel checked 07/12/16 - wnl.

## 2016-07-18 NOTE — Assessment & Plan Note (Signed)
On tamoxifen.  Followed by Dr Bary Castilla.  Mammogram 08/19/15 - [facility].

## 2016-07-18 NOTE — Assessment & Plan Note (Signed)
Recent hgb and iron stores wnl.  Will change her iron supplement to two days per week.  Follow cbc and ferritin.

## 2016-07-18 NOTE — Assessment & Plan Note (Signed)
Low cholesterol diet and exercise.  LDL 109 on recent check.  Follow lipid panel.

## 2016-07-18 NOTE — Assessment & Plan Note (Signed)
No upper symptoms reported. Controlled.  

## 2016-08-04 ENCOUNTER — Encounter: Payer: Self-pay | Admitting: Family

## 2016-08-04 ENCOUNTER — Ambulatory Visit (INDEPENDENT_AMBULATORY_CARE_PROVIDER_SITE_OTHER): Payer: Medicare Other | Admitting: Family

## 2016-08-04 VITALS — BP 124/80 | HR 94 | Temp 98.7°F | Wt 183.8 lb

## 2016-08-04 DIAGNOSIS — J209 Acute bronchitis, unspecified: Secondary | ICD-10-CM | POA: Diagnosis not present

## 2016-08-04 MED ORDER — BENZONATATE 100 MG PO CAPS: 100.0000 mg | ORAL_CAPSULE | Freq: Two times a day (BID) | ORAL | 0 refills | Status: DC | PRN

## 2016-08-04 MED ORDER — AMOXICILLIN 500 MG PO TABS: ORAL_TABLET | ORAL | 0 refills | Status: DC

## 2016-08-04 NOTE — Patient Instructions (Signed)
Stop all decongestants, antihistamines right now  Stay on mucinex  I suspect that your infection is viral in nature.  As discussed, I advise that you wait to fill the antibiotic after 1-2 days of symptom management to see if your symptoms improve. If you do not show improvement, you may take the antibiotic as prescribed.   Increase intake of clear fluids. Congestion is best treated by hydration, when mucus is wetter, it is thinner, less sticky, and easier to expel from the body, either through coughing up drainage, or by blowing your nose.   Get plenty of rest.   Use saline nasal drops and blow your nose frequently. Run a humidifier at night and elevate the head of the bed. Vicks Vapor rub will help with congestion and cough. Steam showers and sinus massage for congestion.   Use Acetaminophen or Ibuprofen as needed for fever or pain. Avoid second hand smoke. Even the smallest exposure will worsen symptoms.   Over the counter medications you can try include Delsym for cough, a decongestant for congestion, and Mucinex or Robitussin as an expectorant. Be sure to just get the plain Mucinex or Robitussin that just has one medication (Guaifenesen). We don't recommend the combination products. Note, be sure to drink two glasses of water with each dose of Mucinex as the medication will not work well without adequate hydration.   You can also try a teaspoon of honey to see if this will help reduce cough. Throat lozenges can sometimes be beneficial as well.    This illness will typically last 7 - 10 days.   Please follow up with our clinic if you develop a fever greater than 101 F, symptoms worsen, or do not resolve in the next week.

## 2016-08-04 NOTE — Progress Notes (Signed)
Subjective:    Patient ID: Helen Marshall, female    DOB: 1946-07-27, 71 y.o.   MRN: [id number]  CC: Helen Marshall is a 71 y.o. female who presents today for an acute visit.    HPI: CC: productive to dry cough, congestion for past 2 weeks, waxing and waning.  Endorses occasional left ear pain, left sided sinus pressure. Used albuterol for cough yesterday with relief. Taking antihistamine, mucinex, decongestant with no relief. No fever, chills.       HISTORY:  Past Medical History:  Diagnosis Date  . Abnormal LFTs   . Abnormal mammogram 12/19/2012   left  . Allergy   . Anemia   . Cancer (HCC)    Breast  . Carotid bruit    left  . Cataract   . Colon polyps   . Diverticulosis   . Diverticulosis 2012  . Environmental allergies   . GERD (gastroesophageal reflux disease)   . Heart murmur   . Hypercholesterolemia   . Hyperglycemia   . Hypertension   . IBS (irritable bowel syndrome)   . Lipoma of colon   . Lumbar disc disease   . 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   Past Surgical History:  Procedure Laterality Date  . ABDOMINAL HYSTERECTOMY  1989   fibroids  . ANKLE SURGERY Left 1999  . BREAST SURGERY Left 1970   biopsy  . CHOLECYSTECTOMY  2001  . COLONOSCOPY  06/02/2011   Verdie Shire, MD; diverticulosis, submucosal lipoma of the sigmoid colon.  Marland Kitchen HERNIA REPAIR  2001, 2004  . HERNIA REPAIR  01/22/2013   Repeat repair of umbilical defect, 2.5 cm. Primary repair.  Marland Kitchen NASAL SINUS SURGERY  1997  . rotator cuff surgery  1998  . SQUAMOUS CELL CARCINOMA EXCISION Right 05/2013   shoulder  . TUBAL LIGATION  1976  . UPPER GI ENDOSCOPY  08/22/14   Dr Billie Lade   Family History  Problem Relation Age of Onset  . CVA Father   . Alcohol abuse Father   . Heart disease Mother     myocardial infarction  . Diabetes Mother     . Hypertension Brother     x2  . Alcohol abuse Brother   . Arthritis/Rheumatoid Sister     x2  . Breast cancer Sister 68  . Osteoarthritis Sister   . Asthma Sister   . Hypothyroidism Sister   . Cancer Other     breast - paternal first cousin  . Colon cancer Maternal Uncle   . Colon polyps Son   . Colon polyps Sister     Allergies: Crestor [rosuvastatin]; Demerol [meperidine]; Latex; Lipitor [atorvastatin]; Lovastatin; Pravastatin; Tramadol; Vicodin [hydrocodone-acetaminophen]; and Zocor [simvastatin] Current Outpatient Prescriptions on File Prior to Visit  Medication Sig Dispense Refill  . amLODipine (NORVASC) 5 MG tablet TAKE 1 TABLET BY MOUTH ONCE DAILY. 90 tablet 3  . benazepril (LOTENSIN) 40 MG tablet Take 1 tablet (40 mg total) by mouth daily. 90 tablet 1  . calcium-vitamin D (CALCIUM 500+D) 500-400 MG-UNIT per tablet Take 1 tablet by mouth daily.     Marland Kitchen EPIPEN 2-PAK 0.3 MG/0.3ML SOAJ injection     . Fe Fum-FePoly-Vit C-Vit B3 (INTEGRA) 62.5-62.5-40-3 MG CAPS TAKE 1 CAPSULE BY MOUTH ONCE DAILY 30 each 2  . fish oil-omega-3 fatty acids 1000 MG capsule 5 (five) times  daily.     . fluticasone (FLONASE) 50 MCG/ACT nasal spray Place 2 sprays into both nostrils as needed. 16 g 5  . hydrochlorothiazide (HYDRODIURIL) 25 MG tablet TAKE 1 TABLET BY MOUTH ONCE DAILY. 90 tablet 3  . Multiple Vitamin (MULTIVITAMIN) tablet Take 1 tablet by mouth daily.    . Multiple Vitamins-Minerals (EYE VITAMINS PO) Take by mouth.    . niacin (NIASPAN) 1000 MG CR tablet Take 1,000 mg by mouth 2 (two) times daily.    . potassium chloride (K-DUR) 10 MEQ tablet TAKE 1 TABLET BY MOUTH TWICE DAILY. 60 tablet 11  . PROAIR HFA 108 (90 Base) MCG/ACT inhaler     . Probiotic Product (VSL#3 PO) Take by mouth. 1 capsul daily    . ranitidine (ZANTAC) 150 MG tablet TAKE 1 TABLET BY MOUTH TWICE DAILY AS NEEDED FOR HEARTBURN. 60 tablet 3  . tamoxifen (NOLVADEX) 20 MG tablet Take 1 tablet (20 mg total) by mouth daily. 90  tablet 3  . triamcinolone cream (KENALOG) 0.1 % Apply 1 application topically as needed.      Current Facility-Administered Medications on File Prior to Visit  Medication Dose Route Frequency Provider Last Rate Last Dose  . 0.9 %  sodium chloride infusion  500 mL Intravenous Continuous Jerene Bears, MD        Social History  Substance Use Topics  . Smoking status: Never Smoker  . Smokeless tobacco: Never Used  . Alcohol use No    Review of Systems  Constitutional: Negative for chills and fever.  HENT: Positive for congestion, ear pain (right) and sinus pressure. Negative for sore throat.   Eyes: Negative for visual disturbance.  Respiratory: Positive for cough. Negative for shortness of breath and wheezing.   Cardiovascular: Negative for chest pain and palpitations.  Gastrointestinal: Negative for nausea and vomiting.  Neurological: Negative for headaches.      Objective:    BP 124/80 (BP Location: Left Arm, Patient Position: Sitting, Cuff Size: Normal)   Pulse 94   Temp 98.7 F (37.1 C) (Oral)   Wt 183 lb 12.8 oz (83.4 kg)   LMP 07/21/1988   SpO2 96%   BMI 32.56 kg/m    Physical Exam  Constitutional: She appears well-developed and well-nourished.  HENT:  Head: Normocephalic and atraumatic.  Right Ear: Hearing, tympanic membrane, external ear and ear canal normal. No drainage, swelling or tenderness. No foreign bodies. Tympanic membrane is not erythematous and not bulging. No middle ear effusion. No decreased hearing is noted.  Left Ear: Hearing, tympanic membrane, external ear and ear canal normal. No drainage, swelling or tenderness. No foreign bodies. Tympanic membrane is not erythematous and not bulging.  No middle ear effusion. No decreased hearing is noted.  Nose: Nose normal. No rhinorrhea. Right sinus exhibits no maxillary sinus tenderness and no frontal sinus tenderness. Left sinus exhibits no maxillary sinus tenderness and no frontal sinus tenderness.    Mouth/Throat: Uvula is midline, oropharynx is clear and moist and mucous membranes are normal. No oropharyngeal exudate, posterior oropharyngeal edema, posterior oropharyngeal erythema or tonsillar abscesses.  Eyes: Conjunctivae are normal.  Cardiovascular: Regular rhythm, normal heart sounds and normal pulses.   Pulmonary/Chest: Effort normal and breath sounds normal. She has no wheezes. She has no rhonchi. She has no rales.  Lymphadenopathy:       Head (right side): No submental, no submandibular, no tonsillar, no preauricular, no posterior auricular and no occipital adenopathy present.       Head (left  side): No submental, no submandibular, no tonsillar, no preauricular, no posterior auricular and no occipital adenopathy present.    She has no cervical adenopathy.  Neurological: She is alert.  Skin: Skin is warm and dry.  Psychiatric: She has a normal mood and affect. Her speech is normal and behavior is normal. Thought content normal.  Vitals reviewed.      Assessment & Plan:   1. Acute bronchitis, unspecified organism Afebrile today. Based on duration of symptoms and the patient had modest improvement, patient and I agreed to delay antibody therapy; she will treat conservatively for next couple days. If No improvement, patient understands to fill antibiotic. Return precautions given.  - amoxicillin (AMOXIL) 500 MG tablet; Take two tablets ( total 1000 mg) PO q24h for 10 days  Dispense: 20 tablet; Refill: 0 - benzonatate (TESSALON) 100 MG capsule; Take 1 capsule (100 mg total) by mouth 2 (two) times daily as needed for cough.  Dispense: 20 capsule; Refill: 0    I am having Ms. Platas maintain her niacin, multivitamin, calcium-vitamin D, fish oil-omega-3 fatty acids, Multiple Vitamins-Minerals (EYE VITAMINS PO), triamcinolone cream, fluticasone, tamoxifen, potassium chloride, amLODipine, ranitidine, Probiotic Product (VSL#3 PO), hydrochlorothiazide, PROAIR HFA, EPIPEN 2-PAK, benazepril,  and INTEGRA. We will continue to administer sodium chloride.   No orders of the defined types were placed in this encounter.   Return precautions given.   Risks, benefits, and alternatives of the medications and treatment plan prescribed today were discussed, and patient expressed understanding.   Education regarding symptom management and diagnosis given to patient on AVS.  Continue to follow with Einar Pheasant, MD for routine health maintenance.   Helen Marshall and I agreed with plan.   Mable Paris, FNP

## 2016-08-04 NOTE — Progress Notes (Signed)
Pre visit review using our clinic review tool, if applicable. No additional management support is needed unless otherwise documented below in the visit note. 

## 2016-08-23 ENCOUNTER — Encounter: Payer: Self-pay | Admitting: General Surgery

## 2016-08-23 LAB — HM MAMMOGRAPHY

## 2016-08-24 ENCOUNTER — Other Ambulatory Visit: Payer: Self-pay | Admitting: Internal Medicine

## 2016-08-31 ENCOUNTER — Ambulatory Visit (INDEPENDENT_AMBULATORY_CARE_PROVIDER_SITE_OTHER): Payer: Medicare Other | Admitting: General Surgery

## 2016-08-31 ENCOUNTER — Encounter: Payer: Self-pay | Admitting: General Surgery

## 2016-08-31 VITALS — BP 130/72 | HR 76 | Resp 14 | Ht 63.0 in | Wt 180.0 lb

## 2016-08-31 DIAGNOSIS — D0512 Intraductal carcinoma in situ of left breast: Secondary | ICD-10-CM | POA: Diagnosis not present

## 2016-08-31 NOTE — Patient Instructions (Addendum)
The patient is aware to call back for any questions or concerns. The patient has been asked to return to the office in one year with a bilateral diagnostic mammogram. 

## 2016-08-31 NOTE — Progress Notes (Signed)
Patient ID: Helen Marshall, female   DOB: 12-29-1945, 71 y.o.   MRN: [id number]  Chief Complaint  Patient presents with  . Follow-up    HPI Helen Marshall is a 71 y.o. female.  who presents for her follow up breast cancer and a breast evaluation. The most recent mammogram was done on 08-23-16 . She has a history of left breast cancer with surgical lumpectomy. Patient does perform regular self breast checks and gets regular mammograms done.  No new breast issues. Tolerating tamoxifen without vasomotor symptoms.  HPI  Past Medical History:  Diagnosis Date  . Abnormal LFTs   . Abnormal mammogram 12/19/2012   left  . Allergy   . Anemia   . Cancer (HCC)    Breast  . Carotid bruit    left  . Cataract   . Colon polyps   . Diverticulosis   . Diverticulosis 2012  . Environmental allergies   . GERD (gastroesophageal reflux disease)   . Heart murmur   . Hypercholesterolemia   . Hyperglycemia   . Hypertension   . IBS (irritable bowel syndrome)   . Lipoma of colon   . Lumbar disc disease   . 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    Past Surgical History:  Procedure Laterality Date  . ABDOMINAL HYSTERECTOMY  1989   fibroids  . ANKLE SURGERY Left 1999  . BREAST SURGERY Left 1970   biopsy  . CATARACT EXTRACTION, BILATERAL  June & July 2017  . CHOLECYSTECTOMY  2001  . COLONOSCOPY  06/02/2011   Verdie Shire, MD; diverticulosis, submucosal lipoma of the sigmoid colon.  Marland Kitchen HERNIA REPAIR  2001, 2004  . HERNIA REPAIR  01/22/2013   Repeat repair of umbilical defect, 2.5 cm. Primary repair.  Marland Kitchen NASAL SINUS SURGERY  1997  . rotator cuff surgery  1998  . SQUAMOUS CELL CARCINOMA EXCISION Right 05/2013   shoulder  . TUBAL LIGATION  1976  . UPPER GI ENDOSCOPY  08/22/14   Dr Billie Lade    Family History  Problem Relation Age of Onset  . CVA  Father   . Alcohol abuse Father   . Heart disease Mother     myocardial infarction  . Diabetes Mother   . Hypertension Brother     x2  . Alcohol abuse Brother   . Arthritis/Rheumatoid Sister     x2  . Breast cancer Sister 84  . Osteoarthritis Sister   . Asthma Sister   . Hypothyroidism Sister   . Cancer Other     breast - paternal first cousin  . Colon cancer Maternal Uncle   . Colon polyps Son   . Colon polyps Sister     Social History Social History  Substance Use Topics  . Smoking status: Never Smoker  . Smokeless tobacco: Never Used  . Alcohol use No    Allergies  Allergen Reactions  . Crestor [Rosuvastatin] Other (See Comments)    Leg cramping   . Demerol [Meperidine] Nausea And Vomiting  . Latex Other (See Comments)    Redness, hands breakout  . Lipitor [Atorvastatin] Other (See Comments)    Intolerant   . Lovastatin Other (See Comments)    Elevated CK  . Pravastatin Other (See Comments)    intolerant  . Tramadol Nausea Only  . Vicodin [Hydrocodone-Acetaminophen] Other (See Comments)  itching  . Zocor [Simvastatin] Other (See Comments)    Intolerant     Current Outpatient Prescriptions  Medication Sig Dispense Refill  . amLODipine (NORVASC) 5 MG tablet TAKE 1 TABLET BY MOUTH ONCE DAILY. 90 tablet 3  . benazepril (LOTENSIN) 40 MG tablet Take 1 tablet (40 mg total) by mouth daily. 90 tablet 1  . calcium-vitamin D (CALCIUM 500+D) 500-400 MG-UNIT per tablet Take 1 tablet by mouth daily.     Marland Kitchen EPIPEN 2-PAK 0.3 MG/0.3ML SOAJ injection     . Fe Fum-FePoly-Vit C-Vit B3 (INTEGRA) 62.5-62.5-40-3 MG CAPS TAKE 1 CAPSULE BY MOUTH ONCE DAILY 30 each 2  . fish oil-omega-3 fatty acids 1000 MG capsule 5 (five) times daily.     . fluticasone (FLONASE) 50 MCG/ACT nasal spray Place 2 sprays into both nostrils as needed. 16 g 5  . hydrochlorothiazide (HYDRODIURIL) 25 MG tablet TAKE 1 TABLET BY MOUTH ONCE DAILY. 90 tablet 3  . Multiple Vitamin (MULTIVITAMIN) tablet  Take 1 tablet by mouth daily.    . niacin (NIASPAN) 1000 MG CR tablet Take 1,000 mg by mouth 2 (two) times daily.    . potassium chloride (K-DUR) 10 MEQ tablet TAKE 1 TABLET BY MOUTH TWICE DAILY. 60 tablet 1  . PROAIR HFA 108 (90 Base) MCG/ACT inhaler Inhale 2 puffs into the lungs every 4 (four) hours as needed.     . Probiotic Product (VSL#3 PO) Take by mouth. 1 capsul daily    . ranitidine (ZANTAC) 150 MG tablet TAKE 1 TABLET BY MOUTH TWICE DAILY AS NEEDED FOR HEARTBURN. 60 tablet 3  . tamoxifen (NOLVADEX) 20 MG tablet Take 1 tablet (20 mg total) by mouth daily. 90 tablet 3  . triamcinolone cream (KENALOG) 0.1 % Apply 1 application topically as needed.      No current facility-administered medications for this visit.     Review of Systems Review of Systems  Constitutional: Negative.   Respiratory: Negative.   Cardiovascular: Negative.     Blood pressure 130/72, pulse 76, resp. rate 14, height 5\' 3"  (1.6 m), weight 180 lb (81.6 kg), last menstrual period 07/21/1988.  Physical Exam Physical Exam  Constitutional: She is oriented to person, place, and time. She appears well-developed and well-nourished.  HENT:  Mouth/Throat: Oropharynx is clear and moist.  Eyes: Conjunctivae are normal. No scleral icterus.  Neck: Neck supple.  Cardiovascular: Normal rate and regular rhythm.   Murmur heard.  Systolic murmur is present with a grade of 1/6  Pulmonary/Chest: Effort normal and breath sounds normal. Right breast exhibits no inverted nipple, no mass, no nipple discharge, no skin change and no tenderness. Left breast exhibits no inverted nipple, no mass, no nipple discharge, no skin change and no tenderness.    Well healed incision left breast.  Abdominal: Soft.  Lymphadenopathy:    She has no cervical adenopathy.    She has no axillary adenopathy.  Neurological: She is alert and oriented to person, place, and time.  Skin: Skin is warm and dry.  Psychiatric: Her behavior is normal.     Data Reviewed Bilateral diagnostic mammograms completed at [facility] dated 08/23/2016 reviewed. Postsurgical changes. BI-RADS-2.  Assessment    Benign breast exam.    Plan    Doing well now 3 and a half years status post wide excision for a complex sclerosing lesion with grade 1 DCIS.    The patient has been asked to return to the office in one year with a bilateral diagnostic mammogram.  This information  has been scribed by Karie Fetch RN, BSN,BC.   Robert Bellow 08/31/2016, 11:37 AM

## 2016-11-09 ENCOUNTER — Other Ambulatory Visit: Payer: Self-pay | Admitting: Internal Medicine

## 2016-11-15 ENCOUNTER — Telehealth: Payer: Self-pay | Admitting: Radiology

## 2016-11-15 ENCOUNTER — Other Ambulatory Visit (INDEPENDENT_AMBULATORY_CARE_PROVIDER_SITE_OTHER): Payer: Medicare Other

## 2016-11-15 DIAGNOSIS — R7989 Other specified abnormal findings of blood chemistry: Secondary | ICD-10-CM

## 2016-11-15 DIAGNOSIS — E7800 Pure hypercholesterolemia, unspecified: Secondary | ICD-10-CM | POA: Diagnosis not present

## 2016-11-15 DIAGNOSIS — I10 Essential (primary) hypertension: Secondary | ICD-10-CM | POA: Diagnosis not present

## 2016-11-15 DIAGNOSIS — R945 Abnormal results of liver function studies: Secondary | ICD-10-CM

## 2016-11-15 DIAGNOSIS — D649 Anemia, unspecified: Secondary | ICD-10-CM

## 2016-11-15 DIAGNOSIS — R739 Hyperglycemia, unspecified: Secondary | ICD-10-CM

## 2016-11-15 LAB — HEMOGLOBIN A1C: Hgb A1c MFr Bld: 5.8 % (ref 4.6–6.5)

## 2016-11-15 LAB — FERRITIN: FERRITIN: 20.4 ng/mL (ref 10.0–291.0)

## 2016-11-15 LAB — BASIC METABOLIC PANEL
BUN: 10 mg/dL (ref 6–23)
CALCIUM: 9.1 mg/dL (ref 8.4–10.5)
CO2: 28 mEq/L (ref 19–32)
Chloride: 105 mEq/L (ref 96–112)
Creatinine, Ser: 0.7 mg/dL (ref 0.40–1.20)
GFR: 87.6 mL/min (ref >60.00)
GLUCOSE: 111 mg/dL — AB (ref 70–99)
Potassium: 3.7 mEq/L (ref 3.5–5.1)
Sodium: 138 mEq/L (ref 135–145)

## 2016-11-15 LAB — HEPATIC FUNCTION PANEL
ALT: 21 U/L (ref 0–35)
AST: 27 U/L (ref 0–37)
Albumin: 4 g/dL (ref 3.5–5.2)
Alkaline Phosphatase: 38 U/L — ABNORMAL LOW (ref 39–117)
BILIRUBIN DIRECT: 0.1 mg/dL (ref 0.0–0.3)
Total Bilirubin: 0.4 mg/dL (ref 0.2–1.2)
Total Protein: 6.5 g/dL (ref 6.0–8.3)

## 2016-11-15 LAB — LIPID PANEL
CHOL/HDL RATIO: 3
Cholesterol: 206 mg/dL — ABNORMAL HIGH (ref 0–200)
HDL: 75.6 mg/dL (ref >39.00)
LDL Cholesterol: 100 mg/dL — ABNORMAL HIGH (ref 0–99)
NONHDL: 130.5
Triglycerides: 152 mg/dL — ABNORMAL HIGH (ref 0.0–149.0)
VLDL: 30.4 mg/dL (ref 0.0–40.0)

## 2016-11-15 LAB — CBC WITH DIFFERENTIAL/PLATELET
BASOS ABS: 0.1 10*3/uL (ref 0.0–0.1)
Basophils Relative: 1.3 % (ref 0.0–3.0)
EOS ABS: 0.2 10*3/uL (ref 0.0–0.7)
Eosinophils Relative: 3.2 % (ref 0.0–5.0)
HCT: 39 % (ref 36.0–46.0)
Hemoglobin: 13.2 g/dL (ref 12.0–15.0)
LYMPHS ABS: 1.8 10*3/uL (ref 0.7–4.0)
Lymphocytes Relative: 35.5 % (ref 12.0–46.0)
MCHC: 33.8 g/dL (ref 30.0–36.0)
MCV: 92.1 fl (ref 78.0–100.0)
MONO ABS: 0.5 10*3/uL (ref 0.1–1.0)
Monocytes Relative: 9.2 % (ref 3.0–12.0)
Neutro Abs: 2.6 10*3/uL (ref 1.4–7.7)
Neutrophils Relative %: 50.8 % (ref 43.0–77.0)
Platelets: 193 10*3/uL (ref 150.0–400.0)
RBC: 4.23 Mil/uL (ref 3.87–5.11)
RDW: 13.8 % (ref 11.5–15.5)
WBC: 5 10*3/uL (ref 4.0–10.5)

## 2016-11-15 NOTE — Telephone Encounter (Signed)
Opened in Error.

## 2016-11-19 ENCOUNTER — Ambulatory Visit (INDEPENDENT_AMBULATORY_CARE_PROVIDER_SITE_OTHER): Payer: Medicare Other | Admitting: Internal Medicine

## 2016-11-19 ENCOUNTER — Encounter: Payer: Self-pay | Admitting: Internal Medicine

## 2016-11-19 VITALS — BP 128/68 | HR 73 | Temp 97.7°F | Resp 12 | Ht 63.0 in | Wt 181.4 lb

## 2016-11-19 DIAGNOSIS — G479 Sleep disorder, unspecified: Secondary | ICD-10-CM | POA: Diagnosis not present

## 2016-11-19 DIAGNOSIS — R739 Hyperglycemia, unspecified: Secondary | ICD-10-CM

## 2016-11-19 DIAGNOSIS — R7989 Other specified abnormal findings of blood chemistry: Secondary | ICD-10-CM

## 2016-11-19 DIAGNOSIS — Z0000 Encounter for general adult medical examination without abnormal findings: Secondary | ICD-10-CM

## 2016-11-19 DIAGNOSIS — E2839 Other primary ovarian failure: Secondary | ICD-10-CM

## 2016-11-19 DIAGNOSIS — D0512 Intraductal carcinoma in situ of left breast: Secondary | ICD-10-CM | POA: Diagnosis not present

## 2016-11-19 DIAGNOSIS — I10 Essential (primary) hypertension: Secondary | ICD-10-CM | POA: Diagnosis not present

## 2016-11-19 DIAGNOSIS — R945 Abnormal results of liver function studies: Secondary | ICD-10-CM

## 2016-11-19 DIAGNOSIS — K219 Gastro-esophageal reflux disease without esophagitis: Secondary | ICD-10-CM | POA: Diagnosis not present

## 2016-11-19 DIAGNOSIS — E7800 Pure hypercholesterolemia, unspecified: Secondary | ICD-10-CM

## 2016-11-19 NOTE — Progress Notes (Signed)
Patient ID: Helen Marshall, female   DOB: Dec 24, 1945, 71 y.o.   MRN: [id number]   Subjective:    Patient ID: Helen Marshall, female    DOB: May 02, 1946, 71 y.o.   MRN: [id number]  HPI  Patient here for a physical exam.  She reports some issues with arthritis in her right knee.  Does not limit her activity.  Desires no further intervention.  Wants to monitor.  She also reports some issues with her bowel.  Intermittent flares.  Increased stress recently.  States an employee was [employer] money from her son's company.  She has not been sleeping as well (some nights).  She feels this may be aggravating her bowels.  Discussed with her today.  She declines f/u evaluation at this time.  No chest pain.  No sob.  No acid reflux.     Past Medical History:  Diagnosis Date  . Abnormal LFTs   . Abnormal mammogram 12/19/2012   left  . Allergy   . Anemia   . Cancer (HCC)    Breast  . Carotid bruit    left  . Cataract   . Colon polyps   . Diverticulosis   . Diverticulosis 2012  . Environmental allergies   . GERD (gastroesophageal reflux disease)   . Heart murmur   . Hypercholesterolemia   . Hyperglycemia   . Hypertension   . IBS (irritable bowel syndrome)   . Lipoma of colon   . Lumbar disc disease   . 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   Past Surgical History:  Procedure Laterality Date  . ABDOMINAL HYSTERECTOMY  1989   fibroids  . ANKLE SURGERY Left 1999  . BREAST SURGERY Left 1970   biopsy  . CATARACT EXTRACTION, BILATERAL  June & July 2017  . CHOLECYSTECTOMY  2001  . COLONOSCOPY  06/02/2011   Verdie Shire, MD; diverticulosis, submucosal lipoma of the sigmoid colon.  Marland Kitchen HERNIA REPAIR  2001, 2004  . HERNIA REPAIR  01/22/2013   Repeat repair of umbilical defect, 2.5 cm. Primary repair.  Marland Kitchen NASAL SINUS SURGERY  1997  . rotator cuff  surgery  1998  . SQUAMOUS CELL CARCINOMA EXCISION Right 05/2013   shoulder  . TUBAL LIGATION  1976  . UPPER GI ENDOSCOPY  08/22/14   Dr Billie Lade   Family History  Problem Relation Age of Onset  . CVA Father   . Alcohol abuse Father   . Heart disease Mother     myocardial infarction  . Diabetes Mother   . Hypertension Brother     x2  . Alcohol abuse Brother   . Arthritis/Rheumatoid Sister     x2  . Breast cancer Sister 64  . Osteoarthritis Sister   . Asthma Sister   . Hypothyroidism Sister   . Cancer Other     breast - paternal first cousin  . Colon cancer Maternal Uncle   . Colon polyps Son   . Colon polyps Sister    Social History   Social History  . Marital status: Married    Spouse name: N/A  . Number of children: 2  . Years of education: N/A   Occupational History  . Retired    Social History Main Topics  . Smoking status: Never Smoker  . Smokeless tobacco: Never Used  . Alcohol use No  .  Drug use: No  . Sexual activity: No   Other Topics Concern  . None   Social History Narrative  . None    Outpatient Encounter Prescriptions as of 11/19/2016  Medication Sig  . amLODipine (NORVASC) 5 MG tablet TAKE 1 TABLET BY MOUTH ONCE DAILY.  . benazepril (LOTENSIN) 40 MG tablet Take 1 tablet (40 mg total) by mouth daily.  . calcium-vitamin D (CALCIUM 500+D) 500-400 MG-UNIT per tablet Take 1 tablet by mouth daily.   Marland Kitchen EPIPEN 2-PAK 0.3 MG/0.3ML SOAJ injection   . Fe Fum-FePoly-Vit C-Vit B3 (INTEGRA) 62.5-62.5-40-3 MG CAPS TAKE 1 CAPSULE BY MOUTH ONCE DAILY  . fish oil-omega-3 fatty acids 1000 MG capsule 5 (five) times daily.   . fluticasone (FLONASE) 50 MCG/ACT nasal spray Place 2 sprays into both nostrils as needed.  . hydrochlorothiazide (HYDRODIURIL) 25 MG tablet TAKE 1 TABLET BY MOUTH ONCE DAILY.  . Multiple Vitamin (MULTIVITAMIN) tablet Take 1 tablet by mouth daily.  . niacin (NIASPAN) 1000 MG CR tablet Take 1,000 mg by mouth 2 (two) times daily.  .  potassium chloride (K-DUR) 10 MEQ tablet TAKE 1 TABLET BY MOUTH TWICE DAILY  . PROAIR HFA 108 (90 Base) MCG/ACT inhaler Inhale 2 puffs into the lungs every 4 (four) hours as needed.   . Probiotic Product (VSL#3 PO) Take by mouth. 1 capsul daily  . ranitidine (ZANTAC) 150 MG tablet TAKE 1 TABLET BY MOUTH TWICE DAILY AS NEEDED FOR HEARTBURN.  . tamoxifen (NOLVADEX) 20 MG tablet Take 1 tablet (20 mg total) by mouth daily.  Marland Kitchen triamcinolone cream (KENALOG) 0.1 % Apply 1 application topically as needed.    No facility-administered encounter medications on file as of 11/19/2016.     Review of Systems  Constitutional: Negative for appetite change and unexpected weight change.  HENT: Negative for congestion and sinus pressure.   Eyes: Negative for pain and visual disturbance.  Respiratory: Negative for cough, chest tightness and shortness of breath.   Cardiovascular: Negative for chest pain, palpitations and leg swelling.  Gastrointestinal: Negative for abdominal pain, nausea and vomiting.       Intermittent flares.    Genitourinary: Negative for difficulty urinating and dysuria.  Musculoskeletal: Negative for back pain and joint swelling.  Skin: Negative for color change and rash.  Neurological: Negative for dizziness, light-headedness and headaches.  Hematological: Negative for adenopathy. Does not bruise/bleed easily.  Psychiatric/Behavioral: Negative for agitation and dysphoric mood.       Objective:    Physical Exam  Constitutional: She is oriented to person, place, and time. She appears well-developed and well-nourished. No distress.  HENT:  Nose: Nose normal.  Mouth/Throat: Oropharynx is clear and moist.  Eyes: Right eye exhibits no discharge. Left eye exhibits no discharge. No scleral icterus.  Neck: Neck supple. No thyromegaly present.  Cardiovascular: Normal rate and regular rhythm.   Pulmonary/Chest: Breath sounds normal. No accessory muscle usage. No tachypnea. No respiratory  distress. She has no decreased breath sounds. She has no wheezes. She has no rhonchi. Right breast exhibits no inverted nipple, no mass, no nipple discharge and no tenderness (no axillary adenopathy). Left breast exhibits no inverted nipple, no mass, no nipple discharge and no tenderness (no axilarry adenopathy).  Abdominal: Soft. Bowel sounds are normal. There is no tenderness.  Musculoskeletal: She exhibits no edema or tenderness.  Lymphadenopathy:    She has no cervical adenopathy.  Neurological: She is alert and oriented to person, place, and time.  Skin: Skin is warm. No rash noted.  No erythema.  Psychiatric: She has a normal mood and affect. Her behavior is normal.    BP 128/68 (BP Location: Left Arm, Patient Position: Sitting, Cuff Size: Normal)   Pulse 73   Temp 97.7 F (36.5 C) (Oral)   Resp 12   Ht _0  (1.6 m)   Wt 181 lb 6.4 oz (82.3 kg)   LMP 07/21/1988   SpO2 96%   BMI 32.13 kg/m  Wt Readings from Last 3 Encounters:  11/19/16 181 lb 6.4 oz (82.3 kg)  08/31/16 180 lb (81.6 kg)  08/04/16 183 lb 12.8 oz (83.4 kg)     Lab Results  Component Value Date   WBC 5.0 11/15/2016   HGB 13.2 11/15/2016   HCT 39.0 11/15/2016   PLT 193.0 11/15/2016   GLUCOSE 111 (H) 11/15/2016   CHOL 206 (H) 11/15/2016   TRIG 152.0 (H) 11/15/2016   HDL 75.60 11/15/2016   LDLDIRECT 101.4 05/07/2013   LDLCALC 100 (H) 11/15/2016   ALT 21 11/15/2016   AST 27 11/15/2016   NA 138 11/15/2016   K 3.7 11/15/2016   CL 105 11/15/2016   CREATININE 0.70 11/15/2016   BUN 10 11/15/2016   CO2 28 11/15/2016   TSH 0.76 03/05/2016   HGBA1C 5.8 11/15/2016   MICROALBUR 0.5 01/16/2014        Assessment & Plan:   Problem List Items Addressed This Visit    Abnormal liver function test    Follow liver panel.  Diet and exercise.        GERD (gastroesophageal reflux disease)    No upper symptoms.  Follow.        Health care maintenance    Physical today 11/19/16.  Mammogram 08/23/16 - birads II.   Followed by dr Bary Castilla.  Colonoscopy 2017 - diverticulosis.        Hypercholesterolemia    Discussed cholesterol lab results.  She has tried multiple stat medications.  Unable to take.  Discussed other treatment options.  She prefers to hold on any further medication at this time.  Follow lipid panel.       Relevant Orders   Hepatic function panel   Lipid panel   Hyperglycemia    Low carb diet and exercise.  Follow met b and a1c.        Relevant Orders   Hemoglobin A1c   Hypertension    Blood pressure under good control.  Continue same medication regimen.  Follow pressures.  Follow metabolic panel.        Relevant Orders   TSH   Basic metabolic panel   Neoplasm of left breast, primary tumor 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)    On tamoxifen.  Followed by Dr Bary Castilla.  Mammogram 08/23/16 - Birads II.         Other Visit Diagnoses    Routine general medical examination at a health care facility    -  Primary   Estrogen deficiency       Relevant Orders   DG Bone Density   Sleeping difficulty       sleeping issues as outlined.  increased stress.  bowel flares.  discussed further intervention.  she wants to monitor.  will notify me if persistent problems.        Einar Pheasant, MD

## 2016-11-19 NOTE — Assessment & Plan Note (Addendum)
Physical today 11/19/16.  Mammogram 08/23/16 - birads II.  Followed by dr Bary Castilla.  Colonoscopy 2017 - diverticulosis.

## 2016-11-19 NOTE — Progress Notes (Signed)
Pre-visit discussion using our clinic review tool. No additional management support is needed unless otherwise documented below in the visit note.  

## 2016-11-21 ENCOUNTER — Encounter: Payer: Self-pay | Admitting: Internal Medicine

## 2016-11-21 NOTE — Assessment & Plan Note (Signed)
On tamoxifen.  Followed by Dr Bary Castilla.  Mammogram 08/23/16 - Birads II.

## 2016-11-21 NOTE — Assessment & Plan Note (Signed)
Blood pressure under good control.  Continue same medication regimen.  Follow pressures.  Follow metabolic panel.   

## 2016-11-21 NOTE — Assessment & Plan Note (Signed)
No upper symptoms.  Follow.  

## 2016-11-21 NOTE — Assessment & Plan Note (Signed)
Follow liver panel.  Diet and exercise.   

## 2016-11-21 NOTE — Assessment & Plan Note (Signed)
Discussed cholesterol lab results.  She has tried multiple stat medications.  Unable to take.  Discussed other treatment options.  She prefers to hold on any further medication at this time.  Follow lipid panel.

## 2016-11-21 NOTE — Assessment & Plan Note (Signed)
Low carb diet and exercise.  Follow met b and a1c.   

## 2016-11-24 ENCOUNTER — Other Ambulatory Visit: Payer: Self-pay | Admitting: Internal Medicine

## 2016-12-01 ENCOUNTER — Ambulatory Visit
Admission: RE | Admit: 2016-12-01 | Discharge: 2016-12-01 | Disposition: A | Discharge status: Home / Self Care | Payer: Medicare Other | Source: Ambulatory Visit | Attending: Internal Medicine | Admitting: Internal Medicine

## 2016-12-01 DIAGNOSIS — Z13820 Encounter for screening for osteoporosis: Secondary | ICD-10-CM | POA: Diagnosis not present

## 2016-12-01 DIAGNOSIS — E2839 Other primary ovarian failure: Secondary | ICD-10-CM

## 2016-12-01 DIAGNOSIS — M8580 Other specified disorders of bone density and structure, unspecified site: Secondary | ICD-10-CM | POA: Diagnosis not present

## 2017-01-03 ENCOUNTER — Other Ambulatory Visit: Payer: Self-pay | Admitting: Internal Medicine

## 2017-01-04 ENCOUNTER — Encounter: Payer: Self-pay | Admitting: *Deleted

## 2017-01-17 ENCOUNTER — Ambulatory Visit: Payer: Medicare Other | Admitting: Internal Medicine

## 2017-02-21 ENCOUNTER — Other Ambulatory Visit: Payer: Self-pay | Admitting: Internal Medicine

## 2017-02-25 ENCOUNTER — Other Ambulatory Visit: Payer: Self-pay | Admitting: Internal Medicine

## 2017-03-28 ENCOUNTER — Other Ambulatory Visit (INDEPENDENT_AMBULATORY_CARE_PROVIDER_SITE_OTHER): Payer: Medicare Other

## 2017-03-28 DIAGNOSIS — E7800 Pure hypercholesterolemia, unspecified: Secondary | ICD-10-CM | POA: Diagnosis not present

## 2017-03-28 DIAGNOSIS — I10 Essential (primary) hypertension: Secondary | ICD-10-CM | POA: Diagnosis not present

## 2017-03-28 DIAGNOSIS — R739 Hyperglycemia, unspecified: Secondary | ICD-10-CM | POA: Diagnosis not present

## 2017-03-28 LAB — LIPID PANEL
CHOL/HDL RATIO: 3
Cholesterol: 200 mg/dL (ref 0–200)
HDL: 75.5 mg/dL (ref >39.00)
LDL CALC: 102 mg/dL — AB (ref 0–99)
NONHDL: 124.7
TRIGLYCERIDES: 116 mg/dL (ref 0.0–149.0)
VLDL: 23.2 mg/dL (ref 0.0–40.0)

## 2017-03-28 LAB — BASIC METABOLIC PANEL
BUN: 11 mg/dL (ref 6–23)
CHLORIDE: 102 meq/L (ref 96–112)
CO2: 31 mEq/L (ref 19–32)
CREATININE: 0.66 mg/dL (ref 0.40–1.20)
Calcium: 9.2 mg/dL (ref 8.4–10.5)
GFR: 93.65 mL/min (ref >60.00)
Glucose, Bld: 105 mg/dL — ABNORMAL HIGH (ref 70–99)
POTASSIUM: 3.8 meq/L (ref 3.5–5.1)
Sodium: 138 mEq/L (ref 135–145)

## 2017-03-28 LAB — HEPATIC FUNCTION PANEL
ALK PHOS: 41 U/L (ref 39–117)
ALT: 19 U/L (ref 0–35)
AST: 24 U/L (ref 0–37)
Albumin: 4 g/dL (ref 3.5–5.2)
BILIRUBIN DIRECT: 0.1 mg/dL (ref 0.0–0.3)
BILIRUBIN TOTAL: 0.4 mg/dL (ref 0.2–1.2)
TOTAL PROTEIN: 6.2 g/dL (ref 6.0–8.3)

## 2017-03-28 LAB — HEMOGLOBIN A1C: Hgb A1c MFr Bld: 5.5 % (ref 4.6–6.5)

## 2017-03-28 LAB — TSH: TSH: 1.43 u[IU]/mL (ref 0.35–4.50)

## 2017-03-29 ENCOUNTER — Encounter: Payer: Self-pay | Admitting: Internal Medicine

## 2017-03-30 ENCOUNTER — Ambulatory Visit (INDEPENDENT_AMBULATORY_CARE_PROVIDER_SITE_OTHER): Payer: Medicare Other

## 2017-03-30 ENCOUNTER — Ambulatory Visit (INDEPENDENT_AMBULATORY_CARE_PROVIDER_SITE_OTHER): Payer: Medicare Other | Admitting: Internal Medicine

## 2017-03-30 ENCOUNTER — Encounter: Payer: Self-pay | Admitting: Internal Medicine

## 2017-03-30 VITALS — BP 130/70 | HR 80 | Temp 97.9°F | Resp 14 | Ht 63.0 in | Wt 179.2 lb

## 2017-03-30 DIAGNOSIS — M25562 Pain in left knee: Secondary | ICD-10-CM

## 2017-03-30 DIAGNOSIS — M5441 Lumbago with sciatica, right side: Secondary | ICD-10-CM

## 2017-03-30 DIAGNOSIS — D0512 Intraductal carcinoma in situ of left breast: Secondary | ICD-10-CM | POA: Diagnosis not present

## 2017-03-30 DIAGNOSIS — M5442 Lumbago with sciatica, left side: Secondary | ICD-10-CM | POA: Diagnosis not present

## 2017-03-30 DIAGNOSIS — R7989 Other specified abnormal findings of blood chemistry: Secondary | ICD-10-CM

## 2017-03-30 DIAGNOSIS — M2550 Pain in unspecified joint: Secondary | ICD-10-CM

## 2017-03-30 DIAGNOSIS — M25561 Pain in right knee: Secondary | ICD-10-CM

## 2017-03-30 DIAGNOSIS — R945 Abnormal results of liver function studies: Secondary | ICD-10-CM | POA: Diagnosis not present

## 2017-03-30 DIAGNOSIS — I10 Essential (primary) hypertension: Secondary | ICD-10-CM | POA: Diagnosis not present

## 2017-03-30 DIAGNOSIS — R739 Hyperglycemia, unspecified: Secondary | ICD-10-CM

## 2017-03-30 DIAGNOSIS — K219 Gastro-esophageal reflux disease without esophagitis: Secondary | ICD-10-CM | POA: Diagnosis not present

## 2017-03-30 DIAGNOSIS — Z23 Encounter for immunization: Secondary | ICD-10-CM | POA: Diagnosis not present

## 2017-03-30 DIAGNOSIS — E7800 Pure hypercholesterolemia, unspecified: Secondary | ICD-10-CM | POA: Diagnosis not present

## 2017-03-30 DIAGNOSIS — M519 Unspecified thoracic, thoracolumbar and lumbosacral intervertebral disc disorder: Secondary | ICD-10-CM

## 2017-03-30 LAB — SEDIMENTATION RATE: SED RATE: 1 mm/h (ref 0–30)

## 2017-03-30 NOTE — Progress Notes (Signed)
Pre-visit discussion using our clinic review tool. No additional management support is needed unless otherwise documented below in the visit note.  

## 2017-03-30 NOTE — Progress Notes (Signed)
Patient ID: [name], female   DOB: 06-10-1946, 71 y.o.   MRN: [id number]   Subjective:    Patient ID: [name], female    DOB: 04/08/1946, 71 y.o.   MRN: [id number]  HPI  Patient here for a scheduled follow up.  States she has issues with various joint pains and aching.  Describes knee pain and lower back and hip pain.  Also wrist discomfort.  Is taking tumeric.  No chest pain.  Breathing stable.  Does report increased gas.  Drinking buttermilk.  Helps her bowels.  No abdominal pain.  Tries to stay active.     Past Medical History:  Diagnosis Date  . Abnormal LFTs   . Abnormal mammogram 12/19/2012   left  . Allergy   . Anemia   . Cancer (HCC)    Breast  . Carotid bruit    left  . Cataract   . Colon polyps   . Diverticulosis   . Diverticulosis 2012  . Environmental allergies   . GERD (gastroesophageal reflux disease)   . Heart murmur   . Hypercholesterolemia   . Hyperglycemia   . Hypertension   . IBS (irritable bowel syndrome)   . Lipoma of colon   . Lumbar disc disease   . 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  . Tubular adenoma of colon    Past Surgical History:  Procedure Laterality Date  . ABDOMINAL HYSTERECTOMY  1989   fibroids  . ANKLE SURGERY Left 1999  . BREAST SURGERY Left 1970   biopsy  . CATARACT EXTRACTION, BILATERAL  June & July 2017  . CHOLECYSTECTOMY  2001  . COLONOSCOPY  06/02/2011   Verdie Shire, MD; diverticulosis, submucosal lipoma of the sigmoid colon.  Marland Kitchen HERNIA REPAIR  2001, 2004  . HERNIA REPAIR  01/22/2013   Repeat repair of umbilical defect, 2.5 cm. Primary repair.  Marland Kitchen NASAL SINUS SURGERY  1997  . rotator cuff surgery  1998  . SQUAMOUS CELL CARCINOMA EXCISION Right 05/2013   shoulder  . TUBAL LIGATION  1976  . UPPER GI ENDOSCOPY  08/22/14   Dr Billie Lade   Family History    Problem Relation Age of Onset  . CVA Father   . Alcohol abuse Father   . Heart disease Mother        myocardial infarction  . Diabetes Mother   . Hypertension Brother        x2  . Alcohol abuse Brother   . Arthritis/Rheumatoid Sister        x2  . Breast cancer Sister 30  . Osteoarthritis Sister   . Asthma Sister   . Hypothyroidism Sister   . Cancer Other        breast - paternal first cousin  . Colon cancer Maternal Uncle   . Colon polyps Son   . Colon polyps Sister    Social History   Social History  . Marital status: Married    Spouse name: N/A  . Number of children: 2  . Years of education: N/A   Occupational History  . Retired    Social History Main Topics  . Smoking status: Never Smoker  . Smokeless tobacco: Never Used  . Alcohol use No  . Drug use: No  . Sexual activity: No   Other Topics Concern  . None  Social History Narrative  . None    Outpatient Encounter Prescriptions as of 03/30/2017  Medication Sig  . amLODipine (NORVASC) 5 MG tablet TAKE 1 TABLET BY MOUTH ONCE DAILY  . benazepril (LOTENSIN) 40 MG tablet TAKE ONE TABLET BY MOUTH ONCE DAILY  . calcium-vitamin D (CALCIUM 500+D) 500-400 MG-UNIT per tablet Take 1 tablet by mouth daily.   Marland Kitchen EPIPEN 2-PAK 0.3 MG/0.3ML SOAJ injection   . Fe Fum-FePoly-Vit C-Vit B3 (INTEGRA) 62.5-62.5-40-3 MG CAPS TAKE 1 CAPSULE BY MOUTH ONCE DAILY  . fish oil-omega-3 fatty acids 1000 MG capsule 5 (five) times daily.   . fluticasone (FLONASE) 50 MCG/ACT nasal spray Place 2 sprays into both nostrils as needed.  . hydrochlorothiazide (HYDRODIURIL) 25 MG tablet TAKE 1 TABLET BY MOUTH ONCE DAILY  . Multiple Vitamin (MULTIVITAMIN) tablet Take 1 tablet by mouth daily.  . niacin (NIASPAN) 1000 MG CR tablet Take 1,000 mg by mouth 2 (two) times daily.  . potassium chloride (K-DUR) 10 MEQ tablet TAKE 1 TABLET BY MOUTH TWICE DAILY  . PROAIR HFA 108 (90 Base) MCG/ACT inhaler Inhale 2 puffs into the lungs every 4 (four) hours as  needed.   . Probiotic Product (VSL#3 PO) Take by mouth. 1 capsul daily  . ranitidine (ZANTAC) 150 MG tablet TAKE 1 TABLET BY MOUTH TWICE DAILY AS NEEDED FOR HEARTBURN.  . tamoxifen (NOLVADEX) 20 MG tablet Take 1 tablet (20 mg total) by mouth daily.  Marland Kitchen triamcinolone cream (KENALOG) 0.1 % Apply 1 application topically as needed.   . [DISCONTINUED] benazepril (LOTENSIN) 40 MG tablet Take 1 tablet (40 mg total) by mouth daily.  . [DISCONTINUED] potassium chloride (K-DUR) 10 MEQ tablet TAKE 1 TABLET BY MOUTH TWICE DAILY   No facility-administered encounter medications on file as of 03/30/2017.     Review of Systems  Constitutional: Negative for appetite change and unexpected weight change.  HENT: Negative for congestion and sinus pressure.   Respiratory: Negative for cough, chest tightness and shortness of breath.   Cardiovascular: Negative for chest pain, palpitations and leg swelling.  Gastrointestinal: Negative for nausea and vomiting.       Increased gas.  Bowels relatively stable.    Genitourinary: Negative for difficulty urinating and dysuria.  Musculoskeletal:       Knee pain, wrist pain and low back and hip pain.    Skin: Negative for color change and rash.  Neurological: Negative for dizziness, light-headedness and headaches.  Psychiatric/Behavioral: Negative for agitation and dysphoric mood.       Objective:    Physical Exam  Constitutional: She appears well-developed and well-nourished. No distress.  HENT:  Nose: Nose normal.  Mouth/Throat: Oropharynx is clear and moist.  Neck: Neck supple. No thyromegaly present.  Cardiovascular: Normal rate and regular rhythm.   Pulmonary/Chest: Breath sounds normal. No respiratory distress. She has no wheezes.  Abdominal: Soft. Bowel sounds are normal. There is no tenderness.  Musculoskeletal: She exhibits no edema or tenderness.  Lymphadenopathy:    She has no cervical adenopathy.  Skin: No rash noted. No erythema.  Psychiatric: She  has a normal mood and affect. Her behavior is normal.    BP 130/70 (BP Location: Left Arm, Patient Position: Sitting, Cuff Size: Normal)   Pulse 80   Temp 97.9 F (36.6 C) (Oral)   Resp 14   Ht '5\' 3"'  (1.6 m)   Wt 179 lb 3.2 oz (81.3 kg)   LMP 07/21/1988   SpO2 95%   BMI 31.74 kg/m  Wt Readings from  Last 3 Encounters:  03/30/17 179 lb 3.2 oz (81.3 kg)  11/19/16 181 lb 6.4 oz (82.3 kg)  08/31/16 180 lb (81.6 kg)     Lab Results  Component Value Date   WBC 5.0 11/15/2016   HGB 13.2 11/15/2016   HCT 39.0 11/15/2016   PLT 193.0 11/15/2016   GLUCOSE 105 (H) 03/28/2017   CHOL 200 03/28/2017   TRIG 116.0 03/28/2017   HDL 75.50 03/28/2017   LDLDIRECT 101.4 05/07/2013   LDLCALC 102 (H) 03/28/2017   ALT 19 03/28/2017   AST 24 03/28/2017   NA 138 03/28/2017   K 3.8 03/28/2017   CL 102 03/28/2017   CREATININE 0.66 03/28/2017   BUN 11 03/28/2017   CO2 31 03/28/2017   TSH 1.43 03/28/2017   HGBA1C 5.5 03/28/2017   MICROALBUR 0.5 01/16/2014    Dg Bone Density  Result Date: 12/01/2016 EXAM: DUAL X-RAY ABSORPTIOMETRY (DXA) FOR BONE MINERAL DENSITY IMPRESSION: Dear Dr Einar Pheasant, Your patient Tazaria Dlugosz completed a BMD test on 12/01/2016 using the [place] (analysis version: 14.10) manufactured by [employer]. The following summarizes the results of our evaluation. PATIENT BIOGRAPHICAL: Name: Jianna, Drabik Patient ID: [id number] Birth Date: 1946-06-15 Height: 63.0 in. Gender: Female Exam Date: 12/01/2016 Weight: 177.6 lbs. Indications: Advanced Age, arthritis, Caucasian, Family Hx of Osteoporosis, high risk meds, History of Fracture (Adult), hx breast ca, Hysterectomy, POSTmenopausal Fractures: right tib/fib Treatments: aspirin, Calcium, multivitamin, tamoxifen, Vitamin D ASSESSMENT: The BMD measured at Femur Neck Left is 0.851 g/cm2 with a T-score of -1.3. This patient is considered osteopenic according to [facility]) criteria.L2 &3  were excluded due to degenerative changes. Site Region Measured Measured WHO Young Adult BMD Date       Age      Classification T-score DualFemur Neck Left 12/01/2016 71.3 Osteopenia -1.3 0.851 g/cm2 AP Spine L1-L4 (L2,L3) 12/01/2016 71.3 Normal -0.6 1.096 g/cm2 World Health Organization [facility]) criteria for post-menopausal, Caucasian Women: Normal:       T-score at or above -1 SD Osteopenia:   T-score between -1 and -2.5 SD Osteoporosis: T-score at or below -2.5 SD RECOMMENDATIONS: [facility] recommends that FDA-approved medical therapies be considered in postemenopausal women and men age 7 or older with a: 1. Hip or vertebral (clinical or morphometric) fracture. 2. T-score of < -2.5at the spine or hip. 3. Ten-year fracture probability by FRAX of 3% or greater for hip fracture or 20% or greater for major osteoporotic fracture. All treatment decisions require clinical judgment and consideration of individual patient factors, including patient preferences, co-morbidities, previous drug use, risk factors not captured in the FRAX model (e.g. falls, vitamin D deficiency, increased bone turnover, interval significant decline in bone density) and possible under - or over-estimation of fracture risk by FRAX. All patients should ensure an adequate intake of dietary calcium (1200 mg/d) and vitamin D (800 IU daily) unless contraindicated. FOLLOW-UP: People with diagnosed cases of osteoporosis or at high risk for fracture should have regular bone mineral density tests. For patients eligible for Medicare, routine testing is allowed once every 2 years. The testing frequency can be increased to one year for patients who have rapidly progressing disease, those who are receiving or discontinuing medical therapy to restore bone mass, or have additional risk factors. I have reviewed this report, and agree with the above findings. [facility] Radiology Dear Dr Einar Pheasant, Your patient DAZARIA MACNEILL completed a  FRAX assessment on 12/01/2016 using the [place]  DXA System (analysis version: 14.10) manufactured by [employer]. The following summarizes the results of our evaluation. PATIENT BIOGRAPHICAL: Name: Brittane, Grudzinski Patient ID: [id number] Birth Date: 12-May-1946 Height:    63.0 in. Gender:     Female    [age]       Weight:    177.6 lbs. Ethnicity:  White                            Exam Date: 12/01/2016 FRAX* RESULTS:  (version: 3.5) 10-year Probability of Fracture1 Major Osteoporotic Fracture2 Hip Fracture 15.0% 2.0% Population: [place] (Caucasian) Risk Factors: History of Fracture (Adult) Based on Femur (Left) Neck BMD 1 -The 10-year probability of fracture may be lower than reported if the patient has received treatment. 2 -Major Osteoporotic Fracture: Clinical Spine, Forearm, Hip or Shoulder *FRAX is a Materials engineer of the [employer] for Metabolic Bone Disease, a [place] (WHO) [facility]. ASSESSMENT: The probability of a major osteoporotic fracture is 15.0% within the next ten years. The probability of a hip fracture is 2.0% within the next ten years. Electronically Signed   By: Rolm Baptise M.D.   On: 12/01/2016 10:46       Assessment & Plan:   Problem List Items Addressed This Visit    Abnormal liver function test    Follow liver panel.  Diet and exercise.        GERD (gastroesophageal reflux disease)    On zantac.  With increased gas.  Discussed today.  She desires to monitor.  Follow for triggers.  Desires no further w/up at this time.        Hypercholesterolemia    Has tried multiple statin medications.  Unable to take.  Have discussed other treatment options.  She is on niacin.  Discussed labs.  Follow lipid panel.  Low cholesterol diet and exercise.   Lab Results  Component Value Date   CHOL 200 03/28/2017   HDL 75.50 03/28/2017   LDLCALC 102 (H) 03/28/2017   LDLDIRECT 101.4 05/07/2013   TRIG 116.0  03/28/2017   CHOLHDL 3 03/28/2017        Relevant Orders   Hepatic function panel   Lipid panel   Hyperglycemia    Low carb diet and exercise.  Follow met b and a1c.       Relevant Orders   Hemoglobin A1c   Hypertension    Blood pressure under good control.  Continue same medication regimen.  Follow pressures.  Follow metabolic panel.        Relevant Orders   Basic metabolic panel   Lumbar disc disease    Has previously been evaluated by ortho.  With increased low back and hip pain now.  Recheck xray.        Neoplasm of left breast, primary tumor 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)    On tamoxifen.  Follow by Dr Bary Castilla.  Mammogram 08/23/16 - birads II.        Other Visit Diagnoses    Midline low back pain with bilateral sciatica, unspecified chronicity    -  Primary   Relevant Orders   DG Lumbar Spine 2-3 Views (Completed)   Pain in both knees, unspecified chronicity       pain in knees and joint pain as outlined.  check xray.  also check rheum panel.  Relevant Orders   DG Knee 1-2 Views Right (Completed)   DG Knee 1-2 Views Left (Completed)   Arthralgia, unspecified joint       Relevant Orders   Sedimentation rate (Completed)   Rheumatoid factor (Completed)   ANA (Completed)   Encounter for immunization       Relevant Orders   DG Lumbar Spine 2-3 Views (Completed)   DG Knee 1-2 Views Right (Completed)   DG Knee 1-2 Views Left (Completed)   Sedimentation rate (Completed)   Rheumatoid factor (Completed)   ANA (Completed)   Flu vaccine HIGH DOSE PF (Completed)   Anti-nuclear ab-titer (ANA titer) (Completed)       Einar Pheasant, MD

## 2017-03-31 LAB — RHEUMATOID FACTOR

## 2017-04-01 ENCOUNTER — Other Ambulatory Visit: Payer: Self-pay | Admitting: Internal Medicine

## 2017-04-01 DIAGNOSIS — M25561 Pain in right knee: Secondary | ICD-10-CM

## 2017-04-01 DIAGNOSIS — M545 Low back pain, unspecified: Secondary | ICD-10-CM

## 2017-04-01 DIAGNOSIS — M25562 Pain in left knee: Principal | ICD-10-CM

## 2017-04-01 LAB — ANA: Anti Nuclear Antibody(ANA): POSITIVE — AB

## 2017-04-01 LAB — ANTI-NUCLEAR AB-TITER (ANA TITER)

## 2017-04-01 NOTE — Progress Notes (Signed)
Order placed for referral to physical therapy.

## 2017-04-02 ENCOUNTER — Encounter: Payer: Self-pay | Admitting: Internal Medicine

## 2017-04-02 NOTE — Assessment & Plan Note (Signed)
Has previously been evaluated by ortho.  With increased low back and hip pain now.  Recheck xray.

## 2017-04-02 NOTE — Assessment & Plan Note (Signed)
Has tried multiple statin medications.  Unable to take.  Have discussed other treatment options.  She is on niacin.  Discussed labs.  Follow lipid panel.  Low cholesterol diet and exercise.   Lab Results  Component Value Date   CHOL 200 03/28/2017   HDL 75.50 03/28/2017   LDLCALC 102 (H) 03/28/2017   LDLDIRECT 101.4 05/07/2013   TRIG 116.0 03/28/2017   CHOLHDL 3 03/28/2017

## 2017-04-02 NOTE — Assessment & Plan Note (Signed)
Low carb diet and exercise.  Follow met b and a1c.  

## 2017-04-02 NOTE — Assessment & Plan Note (Signed)
Blood pressure under good control.  Continue same medication regimen.  Follow pressures.  Follow metabolic panel.   

## 2017-04-02 NOTE — Assessment & Plan Note (Signed)
On zantac.  With increased gas.  Discussed today.  She desires to monitor.  Follow for triggers.  Desires no further w/up at this time.

## 2017-04-02 NOTE — Assessment & Plan Note (Signed)
Follow liver panel.  Diet and exercise.   

## 2017-04-02 NOTE — Assessment & Plan Note (Signed)
On tamoxifen.  Follow by Dr Bary Castilla.  Mammogram 08/23/16 - birads II.

## 2017-04-04 ENCOUNTER — Other Ambulatory Visit: Payer: Self-pay | Admitting: Internal Medicine

## 2017-04-06 ENCOUNTER — Other Ambulatory Visit: Payer: Self-pay | Admitting: Internal Medicine

## 2017-04-06 DIAGNOSIS — R52 Pain, unspecified: Secondary | ICD-10-CM

## 2017-04-06 DIAGNOSIS — M2550 Pain in unspecified joint: Secondary | ICD-10-CM

## 2017-04-06 NOTE — Progress Notes (Signed)
Order placed for rheumatology referral.  

## 2017-04-18 ENCOUNTER — Encounter: Payer: Self-pay | Admitting: Physical Therapy

## 2017-04-18 ENCOUNTER — Ambulatory Visit: Payer: Medicare Other | Attending: Internal Medicine | Admitting: Physical Therapy

## 2017-04-18 DIAGNOSIS — M25562 Pain in left knee: Secondary | ICD-10-CM | POA: Insufficient documentation

## 2017-04-18 DIAGNOSIS — M545 Low back pain, unspecified: Secondary | ICD-10-CM

## 2017-04-18 DIAGNOSIS — M6281 Muscle weakness (generalized): Secondary | ICD-10-CM | POA: Insufficient documentation

## 2017-04-18 DIAGNOSIS — M25561 Pain in right knee: Secondary | ICD-10-CM | POA: Diagnosis present

## 2017-04-18 NOTE — Therapy (Signed)
[facility] [address] [place], [address] Phone: [phone]   Fax:  [phone]  Physical Therapy Evaluation  Patient Details  Name: Helen Marshall MRN: [id number] Date of Birth: August 30, 1945 Referring Provider: Einar Pheasant MD  Encounter Date: 04/18/2017      PT End of Session - 04/18/17 1256    Visit Number 1   Number of Visits 12   Date for PT Re-Evaluation 05/30/17   Authorization Type 1   Authorization Time Period 10 (G code)   PT Start Time 1038   PT Stop Time 1130   PT Time Calculation (min) 52 min   Activity Tolerance Patient tolerated treatment well   Behavior During Therapy [facility] for tasks assessed/performed      Past Medical History:  Diagnosis Date  . Abnormal LFTs   . Abnormal mammogram 12/19/2012   left  . Allergy   . Anemia   . Cancer (HCC)    Breast  . Carotid bruit    left  . Cataract   . Colon polyps   . Diverticulosis   . Diverticulosis 2012  . Environmental allergies   . GERD (gastroesophageal reflux disease)   . Heart murmur   . Hypercholesterolemia   . Hyperglycemia   . Hypertension   . IBS (irritable bowel syndrome)   . Lipoma of colon   . Lumbar disc disease   . 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  . Tubular adenoma of colon     Past Surgical History:  Procedure Laterality Date  . ABDOMINAL HYSTERECTOMY  1989   fibroids  . ANKLE SURGERY Left 1999  . BREAST SURGERY Left 1970   biopsy  . CATARACT EXTRACTION, BILATERAL  June & July 2017  . CHOLECYSTECTOMY  2001  . COLONOSCOPY  06/02/2011   Verdie Shire, MD; diverticulosis, submucosal lipoma of the sigmoid colon.  Marland Kitchen HERNIA REPAIR  2001, 2004  . HERNIA REPAIR  01/22/2013   Repeat repair of umbilical defect, 2.5 cm. Primary repair.  Marland Kitchen NASAL SINUS SURGERY   1997  . rotator cuff surgery  1998  . SQUAMOUS CELL CARCINOMA EXCISION Right 05/2013   shoulder  . TUBAL LIGATION  1976  . UPPER GI ENDOSCOPY  08/22/14   Dr Lenna Sciara. [place]    There were no vitals filed for this visit.       Subjective Assessment - 04/18/17 1049    Subjective Patient reports she is having knee pain with standing intermittently and lower back pain. prolonged standing increased back pain. problems with going up and down steps. left knee worse than right.    Pertinent History long history of back pain since helping with taking  pier out of water about 7-10 years ago and has had pain in back on/off since. Her knees have been worsening over the past year. 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    Limitations Standing;House hold activities;Walking;Sitting   How long can you sit comfortably? as long as she wants as long as she has her choice of seating   How long can you stand comfortably? 1 hour   How long can you walk comfortably? short and intermediate distances ok   Diagnostic tests X rays knees; back   Patient Stated Goals to be able to do normal daily tasks without the  pain she is having   Currently in Pain? Yes   Pain Score 3   pain at best 3/10, worst 10/10   Pain Location Knee   Pain Orientation Right;Left   Pain Type Chronic pain   Pain Onset More than a month ago   Pain Frequency Intermittent   Aggravating Factors  standing, prolonged walking   Pain Relieving Factors rest, medication   Multiple Pain Sites Yes   Pain Score 4   Pain Location Back   Pain Orientation Lower   Pain Descriptors / Indicators Aching   Pain Type Chronic pain   Pain Onset More than a month ago   Pain Frequency Intermittent   Aggravating Factors  standing, vacuuming   Pain Relieving Factors rest   Effect of Pain on Daily Activities limits ability to perform household chores, gardening and community activities            [facility] PT Assessment - 04/18/17 1136      Assessment    Medical Diagnosis pain in both knees, unspecified chronicity; midline low back pain without sciatica, unspecified chronicity   Referring Provider Einar Pheasant MD   Onset Date/Surgical Date 04/18/16   Prior Therapy No     Precautions   Precautions None     Restrictions   Weight Bearing Restrictions No     Balance Screen   Has the patient fallen in the past 6 months No     [place] residence   Living Arrangements Spouse/significant other   Type of [place] Access Stairs to enter   Entrance Stairs-Number of Steps 15   Entrance Stairs-Rails Right;Left   Home Layout Two level   Alternate Level Stairs-Number of Steps 12   Alternate Level Stairs-Rails Right   Home Equipment Bedside commode;Wheelchair - Education administrator (comment)  rollator     Prior Function   Level of Independence Independent   Vocation Retired   Leisure gardening, walking, exercise, family     Cognition   Overall Cognitive Status Within Functional Limits for tasks assessed     Observation/Other Assessments   Modified [place] 22%   Lower Extremity Functional Scale  42/80     ROM / Strength   AROM / PROM / Strength AROM;Strength     AROM   Overall AROM Comments both LE's WFL throughout ; lumbar spine limited flexion, extension and side bending     Strength   Overall Strength Comments decreased left hip flexion, extension, abduction and ER 4-/5 as compred to right 4+/5     Ambulation/Gait   Assistive device None   Gait Pattern Lateral trunk lean to left;Decreased trunk rotation;Trunk flexed  trunk lean left with stance left       Objective measurements completed on examination: See above findings.           PT Education - 04/18/17 1138    Education provided Yes   Education Details POC; HEP: seated SLR, seated hip adduction with ball and glute sets, seated hip abduction with resistive band, bridging with ball, side  lying clam   Person(s)  Educated Patient   Methods Explanation, demonstration, handout, verbal cuing   Comprehension Verbalized understanding, returned demonstration, verbal cuing required             PT Long Term Goals - 04/18/17 1716      PT LONG TERM GOAL #1   Title  Patient will demonstrate improved function with daily tasks and decreased back  pain as indicated by MODI score of 15% or less   Baseline MODI 22%    Status New   Target Date 05/30/17     PT LONG TERM GOAL #2   Title Patient will demonstrate improved posture awareness and pain control strategies to allow patient to sit/stand for >1 hour with pain 4/10 max   Baseline pain ranges from 3/10 up to 10/10 pain and unable to sit/stand >1 hour   Status New   Target Date 05/16/17     PT LONG TERM GOAL #3   Title Patient will demonstrate improved function with LEFS score of 52/80   Baseline LEFS 42/80   Status New   Target Date 05/16/17     PT LONG TERM GOAL #4   Title  Patient will be independent with home program for posture awareness, pain control, progressive exercises to allow patient to transition to self management once discharged from physical therapy   Baseline limited knowledge of appropriate pain control strategies and exercise progression without assistance   Status New   Target Date 05/30/17     PT LONG TERM GOAL #5   Title Patient will demonstrate improved function with LEFS score of 60/80   Baseline LEFS 42/80   Status New   Target Date 05/30/17                Plan - 04/18/17 1257    Clinical Impression Statement Patient is a 71 year old female who presents with pain in lower back and bilateral knees/LE's with decreased strength noted in core musculature and bilateral LE's left>right. She has LEFS score of 42/80 and MODI score of 22% indicating moderate self perceived disability with daily tasks. Her pain score ranges from 3/10 at best up to 10/10 at worst. She has limited knowledge of appropriate pain control  strategies and progression of exercises in order to improve strength and function and will therefore benefit from physical therapy intervention.    History and Personal Factors relevant to plan of care: chronic pain in back and knees with arthritis present and degenerative changes noted on X rays. HTN.    Clinical Presentation Evolving   Clinical Presentation due to: worsening symptoms over the past year   Clinical Decision Making Moderate   Rehab Potential Good   Clinical Impairments Affecting Rehab Potential (+)motivated, active lifestyle (-)chronic condition   PT Frequency 2x / week   PT Duration 6 weeks   PT Treatment/Interventions Moist Heat;Cryotherapy;Patient/family education;Neuromuscular re-education;Therapeutic exercise;Manual techniques   PT Next Visit Plan therapeutic exercise: stabilization, knee and hip strengthening exercises   PT Home Exercise Plan seated SLR, hip adduction with ball and glute sets, hip abduction with resistive band, side lying clam, bridging with ball    Consulted and Agree with Plan of Care Patient      Patient will benefit from skilled therapeutic intervention in order to improve the following deficits and impairments:  Decreased strength, Pain, Impaired perceived functional ability, Decreased activity tolerance, Decreased endurance, Increased muscle spasms, Difficulty walking, Decreased range of motion  Visit Diagnosis: Midline low back pain without sciatica, unspecified chronicity - Plan: PT plan of care cert/re-cert  Muscle weakness (generalized) - Plan: PT plan of care cert/re-cert  Left knee pain, unspecified chronicity - Plan: PT plan of care cert/re-cert  Right knee pain, unspecified chronicity - Plan: PT plan of care cert/re-cert      G-Codes - 80/99/83 1454    Functional Assessment Tool Used (Outpatient Only) MODI, LEFS, strength, pain, clinical  judgment   Functional Limitation Mobility: Walking and moving around   Mobility: Walking and  Moving Around Current Status [phone]) At least 40 percent but less than 60 percent impaired, limited or restricted   Mobility: Walking and Moving Around Goal Status [phone]) At least 20 percent but less than 40 percent impaired, limited or restricted       Problem List Patient Active Problem List   Diagnosis Date Noted  . Anemia 11/05/2015  . Health care maintenance 11/17/2014  . Umbilical hernia 29/51/8841  . Neoplasm of left breast, 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  . GERD (gastroesophageal reflux disease) 07/22/2012  . Diverticulosis 07/21/2012  . Hypertension 07/21/2012  . Hypercholesterolemia 07/21/2012  . Abnormal liver function test 07/21/2012  . Left carotid bruit 07/21/2012  . Hyperglycemia 07/21/2012  . Environmental allergies 07/21/2012  . Lumbar disc disease 07/21/2012    Jomarie Longs PT 04/18/2017, 5:33 PM  [facility] [address] [place], [address] Phone: [phone]   Fax:  [phone]  Name: Helen Marshall MRN: [id number] Date of Birth: 1945/08/26

## 2017-04-20 ENCOUNTER — Encounter: Payer: Self-pay | Admitting: Physical Therapy

## 2017-04-20 ENCOUNTER — Ambulatory Visit: Payer: Medicare Other | Admitting: Physical Therapy

## 2017-04-20 DIAGNOSIS — M6281 Muscle weakness (generalized): Secondary | ICD-10-CM

## 2017-04-20 DIAGNOSIS — M545 Low back pain, unspecified: Secondary | ICD-10-CM

## 2017-04-20 DIAGNOSIS — M25561 Pain in right knee: Secondary | ICD-10-CM

## 2017-04-20 DIAGNOSIS — M25562 Pain in left knee: Secondary | ICD-10-CM

## 2017-04-21 ENCOUNTER — Telehealth: Payer: Self-pay

## 2017-04-21 ENCOUNTER — Other Ambulatory Visit: Payer: Self-pay

## 2017-04-21 MED ORDER — RANITIDINE HCL 150 MG PO TABS: ORAL_TABLET | ORAL | 3 refills | Status: DC

## 2017-04-21 NOTE — Telephone Encounter (Signed)
Refill request sent by [facility] for refill on Ranitidine 150 mg bid. Has been given in the past by GI do you want to start writing or do I need to let pharmacy know that they need to send to GI?

## 2017-04-21 NOTE — Therapy (Signed)
[facility] [address], [place], [address] Phone: [phone]   Fax:  [phone]  Physical Therapy Treatment  Patient Details  Name: Helen Marshall MRN: [id number] Date of Birth: [birth year] Referring Provider: Einar Pheasant MD  Encounter Date: 04/20/2017      PT End of Session - 04/20/17 1207    Visit Number 2   Number of Visits 12   Date for PT Re-Evaluation 05/30/17   Authorization Type 2   Authorization Time Period 10 (G code)   PT Start Time 1036   PT Stop Time 1115   PT Time Calculation (min) 39 min   Activity Tolerance Patient tolerated treatment well   Behavior During Therapy [facility] for tasks assessed/performed      Past Medical History:  Diagnosis Date  . Abnormal LFTs   . Abnormal mammogram 12/19/2012   left  . Allergy   . Anemia   . Cancer (HCC)    Breast  . Carotid bruit    left  . Cataract   . Colon polyps   . Diverticulosis   . Diverticulosis 2012  . Environmental allergies   . GERD (gastroesophageal reflux disease)   . Heart murmur   . Hypercholesterolemia   . Hyperglycemia   . Hypertension   . IBS (irritable bowel syndrome)   . Lipoma of colon   . Lumbar disc disease   . 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  . Tubular adenoma of colon     Past Surgical History:  Procedure Laterality Date  . ABDOMINAL HYSTERECTOMY  1989   fibroids  . ANKLE SURGERY Left 1999  . BREAST SURGERY Left 1970   biopsy  . CATARACT EXTRACTION, BILATERAL  June & July 2017  . CHOLECYSTECTOMY  2001  . COLONOSCOPY  06/02/2011   Verdie Shire, MD; diverticulosis, submucosal lipoma of the sigmoid colon.  Marland Kitchen HERNIA REPAIR  2001, 2004  . HERNIA REPAIR  01/22/2013   Repeat repair of umbilical defect, 2.5 cm. Primary repair.  Marland Kitchen NASAL SINUS SURGERY   1997  . rotator cuff surgery  1998  . SQUAMOUS CELL CARCINOMA EXCISION Right 05/2013   shoulder  . TUBAL LIGATION  1976  . UPPER GI ENDOSCOPY  08/22/14   Dr Lenna Sciara. [place]    There were no vitals filed for this visit.      Subjective Assessment - 04/20/17 1038    Subjective Patient reoprts she picked up sticks in her yard yesterday and felt increased back soreness that resolved with rest. sitting unsupported at ball game which also aggravated back.    Pertinent History long history of back pain since helping with taking  pier out of water about 7-10 years ago and has had pain in back on/off since. Her knees have been worsening over the past year. 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    Limitations Standing;House hold activities;Walking;Sitting   How long can you sit comfortably? as long as she wants as long as she has her choice of seating   How long can you stand comfortably? 1 hour   How long can you walk comfortably? short and intermediate distances ok   Diagnostic tests X rays knees; back   Patient Stated Goals to be able to do normal daily tasks without the pain she is having  Currently in Pain? Other (Comment)  very mild pain in back and hips/knees   Pain Onset --   Pain Onset --        Objective: Treatment: Therapeutic exercise; patient performed with instruction, assistance, verbal cues of therapist: Seated SLR 5 seconds holds x 5 reps alternating Seated hip adduction with ball and glute sets x 10 with 5 second holds and VC seated hip abduction with resistive band x 15 reps Knee flexion with green resistive band x 15 reps each with assistance Bridging with ball through partial ROM, not raising hips up, controlled motion side lying clam with assistance to maintain proper alignment of hips/trunk x 10 reps each side Standing stabilization with resistive band high and low rows x 10 reps each with repeated demonstration and VC Forward walking and backward walking with  wide stance to work on core, hip strength x 2 min.  Patient response to treatment: patient demonstrated improved technique with exercises with minimal VC for correct alignment. No increased symptoms reported with treatment; some soreness in hips with walking exercise         PT Education - 04/20/17 1207    Education provided Yes   Education Details exercise instruction; added stabilization in standing   Person(s) Educated Patient   Methods Explanation;Demonstration;Verbal cues; handout   Comprehension Verbalized understanding;Returned demonstration;Verbal cues required             PT Long Term Goals - 04/18/17 1716      PT LONG TERM GOAL #1   Title  Patient will demonstrate improved function with daily tasks and decreased back pain as indicated by MODI score of 15% or less   Baseline MODI 22%    Status New   Target Date 05/30/17     PT LONG TERM GOAL #2   Title Patient will demonstrate improved posture awareness and pain control strategies to allow patient to sit/stand for >1 hour with pain 4/10 max   Baseline pain ranges from 3/10 up to 10/10 pain and unable to sit/stand >1 hour   Status New   Target Date 05/16/17     PT LONG TERM GOAL #3   Title Patient will demonstrate improved function with LEFS score of 52/80   Baseline LEFS 42/80   Status New   Target Date 05/16/17     PT LONG TERM GOAL #4   Title  Patient will be independent with home program for posture awareness, pain control, progressive exercises to allow patient to transition to self management once discharged from physical therapy   Baseline limited knowledge of appropriate pain control strategies and exercise progression without assistance   Status New   Target Date 05/30/17     PT LONG TERM GOAL #5   Title Patient will demonstrate improved function with LEFS score of 60/80   Baseline LEFS 42/80   Status New   Target Date 05/30/17               Plan - 04/20/17 1208    Clinical Impression  Statement Patient demonstrated improved endurance, strength and abiltiy to perform exercises with minimal cuing and guidance.    Rehab Potential Good   Clinical Impairments Affecting Rehab Potential (+)motivated, active lifestyle (-)chronic condition   PT Frequency 2x / week   PT Duration 6 weeks   PT Treatment/Interventions Moist Heat;Cryotherapy;Patient/family education;Neuromuscular re-education;Therapeutic exercise;Manual techniques   PT Next Visit Plan therapeutic exercise: stabilization, knee and hip strengthening exercises   PT Home Exercise Plan seated SLR, hip  adduction with ball and glute sets, hip abduction with resistive band, side lying clam, bridging with ball       Patient will benefit from skilled therapeutic intervention in order to improve the following deficits and impairments:  Decreased strength, Pain, Impaired perceived functional ability, Decreased activity tolerance, Decreased endurance, Increased muscle spasms, Difficulty walking, Decreased range of motion  Visit Diagnosis: Midline low back pain without sciatica, unspecified chronicity  Muscle weakness (generalized)  Left knee pain, unspecified chronicity  Right knee pain, unspecified chronicity     Problem List Patient Active Problem List   Diagnosis Date Noted  . Anemia 11/05/2015  . Health care maintenance 11/17/2014  . Umbilical hernia 07/37/1062  . Neoplasm of left breast, 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  . GERD (gastroesophageal reflux disease) 07/22/2012  . Diverticulosis 07/21/2012  . Hypertension 07/21/2012  . Hypercholesterolemia 07/21/2012  . Abnormal liver function test 07/21/2012  . Left carotid bruit 07/21/2012  . Hyperglycemia 07/21/2012  . Environmental allergies 07/21/2012  . Lumbar disc disease 07/21/2012    Jomarie Longs PT 04/21/2017, 12:10 PM  [facility] [address], [place], [address] Phone: [phone]   Fax:  [phone]  Name: Helen Marshall MRN: [id number] Date of Birth: 1946-02-27

## 2017-04-21 NOTE — Telephone Encounter (Signed)
Rx refilled x3

## 2017-04-21 NOTE — Telephone Encounter (Signed)
Ok to refill x 3 

## 2017-04-25 ENCOUNTER — Ambulatory Visit: Payer: Medicare Other | Admitting: Physical Therapy

## 2017-04-27 ENCOUNTER — Encounter: Payer: Self-pay | Admitting: Physical Therapy

## 2017-04-27 ENCOUNTER — Ambulatory Visit: Payer: Medicare Other | Admitting: Physical Therapy

## 2017-04-27 DIAGNOSIS — M25562 Pain in left knee: Secondary | ICD-10-CM

## 2017-04-27 DIAGNOSIS — M545 Low back pain, unspecified: Secondary | ICD-10-CM

## 2017-04-27 DIAGNOSIS — M6281 Muscle weakness (generalized): Secondary | ICD-10-CM

## 2017-04-27 DIAGNOSIS — M25561 Pain in right knee: Secondary | ICD-10-CM

## 2017-04-27 NOTE — Therapy (Signed)
[facility] [address] [place], [address] Phone: [phone]   Fax:  [phone]  Physical Therapy Treatment  Patient Details  Name: Helen Marshall MRN: [id number] Date of Birth: 1946-03-17 Referring Provider: Einar Pheasant MD  Encounter Date: 04/27/2017      PT End of Session - 04/27/17 1041    Visit Number 3   Number of Visits 12   Date for PT Re-Evaluation 05/30/17   Authorization Type 3   Authorization Time Period 10 (G code)   PT Start Time 1036   PT Stop Time 1118   PT Time Calculation (min) 42 min   Activity Tolerance Patient tolerated treatment well   Behavior During Therapy [facility] for tasks assessed/performed      Past Medical History:  Diagnosis Date  . Abnormal LFTs   . Abnormal mammogram 12/19/2012   left  . Allergy   . Anemia   . Cancer (HCC)    Breast  . Carotid bruit    left  . Cataract   . Colon polyps   . Diverticulosis   . Diverticulosis 2012  . Environmental allergies   . GERD (gastroesophageal reflux disease)   . Heart murmur   . Hypercholesterolemia   . Hyperglycemia   . Hypertension   . IBS (irritable bowel syndrome)   . Lipoma of colon   . Lumbar disc disease   . 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  . Tubular adenoma of colon     Past Surgical History:  Procedure Laterality Date  . ABDOMINAL HYSTERECTOMY  1989   fibroids  . ANKLE SURGERY Left 1999  . BREAST SURGERY Left 1970   biopsy  . CATARACT EXTRACTION, BILATERAL  June & July 2017  . CHOLECYSTECTOMY  2001  . COLONOSCOPY  06/02/2011   Verdie Shire, MD; diverticulosis, submucosal lipoma of the sigmoid colon.  Marland Kitchen HERNIA REPAIR  2001, 2004  . HERNIA REPAIR  01/22/2013   Repeat repair of umbilical defect, 2.5 cm. Primary repair.  Marland Kitchen NASAL SINUS SURGERY   1997  . rotator cuff surgery  1998  . SQUAMOUS CELL CARCINOMA EXCISION Right 05/2013   shoulder  . TUBAL LIGATION  1976  . UPPER GI ENDOSCOPY  08/22/14   Dr Lenna Sciara. [place]    There were no vitals filed for this visit.      Subjective Assessment - 04/27/17 1036    Subjective Patient reports she has been workiing out in the yard with bending and had increased pain in her back which has resolved with rest.    Pertinent History long history of back pain since helping with taking  pier out of water about 7-10 years ago and has had pain in back on/off since. Her knees have been worsening over the past year. 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    Limitations Standing;House hold activities;Walking;Sitting   How long can you sit comfortably? as long as she wants as long as she has her choice of seating   How long can you stand comfortably? 1 hour   How long can you walk comfortably? short and intermediate distances ok   Diagnostic tests X rays knees; back   Patient Stated Goals to be able to do normal daily tasks without the pain she is having   Currently in  Pain? Other (Comment)  very mild soreness in lower back         Objective: Gait: ambulating into clinic with decreased trunk rotation, decreased arm swing, increased lateral trunk flexion to left with stance left  Treatment: Therapeutic exercise; patient performed with instruction, assistance, verbal cues of therapist: Goal: improve strength, decrease knee pain, back pain, independent with home program Knee flexion with green resistive band x 15 reps each with assistance Bridging with ball x 10 and then with 5# weight on pelvis x 10 side lying clam with good  alignment of hips/trunk x 15 reps each side Stabilization  Raising 5# weight overhead x 15 reps in hook lying  Standing knee flexion at counter for balance: 5 reps each LE  Walking with holding (2) 5# weights in hands x 2 min. For core strength/LE strengthening  Hip  rotary machine 70# 15 reps hip extension each LE  OMEGA stabilization standing 10# rows and pull to front of thighs 15 reps each Seated reverse chin ups with 15# x 15 reps  Patient response to treatment: patient demonstrated improved technique with exercises with minimal to moderate VC and repeated demonstration for correct alignment. Improved motor control with repetition and cuing.         PT Education - 04/27/17 1100    Education provided Yes   Education Details exercise instruction   Person(s) Educated Patient   Methods Explanation;Demonstration;Verbal cues;Handout   Comprehension Verbalized understanding;Returned demonstration;Verbal cues required             PT Long Term Goals - 04/18/17 1716      PT LONG TERM GOAL #1   Title  Patient will demonstrate improved function with daily tasks and decreased back pain as indicated by MODI score of 15% or less   Baseline MODI 22%    Status New   Target Date 05/30/17     PT LONG TERM GOAL #2   Title Patient will demonstrate improved posture awareness and pain control strategies to allow patient to sit/stand for >1 hour with pain 4/10 max   Baseline pain ranges from 3/10 up to 10/10 pain and unable to sit/stand >1 hour   Status New   Target Date 05/16/17     PT LONG TERM GOAL #3   Title Patient will demonstrate improved function with LEFS score of 52/80   Baseline LEFS 42/80   Status New   Target Date 05/16/17     PT LONG TERM GOAL #4   Title  Patient will be independent with home program for posture awareness, pain control, progressive exercises to allow patient to transition to self management once discharged from physical therapy   Baseline limited knowledge of appropriate pain control strategies and exercise progression without assistance   Status New   Target Date 05/30/17     PT LONG TERM GOAL #5   Title Patient will demonstrate improved function with LEFS score of 60/80   Baseline LEFS 42/80   Status New    Target Date 05/30/17               Plan - 04/27/17 1041    Clinical Impression Statement Patient demonstrated improved endurance, stength and abiltiy to perform exercises with minimal to moderate cuing and guidance. Progressing steadily towards all goals.    Rehab Potential Good   Clinical Impairments Affecting Rehab Potential (+)motivated, active lifestyle (-)chronic condition   PT Frequency 2x / week   PT Duration 6 weeks   PT Treatment/Interventions Moist  Heat;Cryotherapy;Patient/family education;Neuromuscular re-education;Therapeutic exercise;Manual techniques   PT Next Visit Plan therapeutic exercise: stabilization, knee and hip strengthening exercises   PT Home Exercise Plan seated SLR, hip adduction with ball and glute sets, hip abduction with resistive band, side lying clam, bridging with ball       Patient will benefit from skilled therapeutic intervention in order to improve the following deficits and impairments:  Decreased strength, Pain, Impaired perceived functional ability, Decreased activity tolerance, Decreased endurance, Increased muscle spasms, Difficulty walking, Decreased range of motion  Visit Diagnosis: Midline low back pain without sciatica, unspecified chronicity  Muscle weakness (generalized)  Left knee pain, unspecified chronicity  Right knee pain, unspecified chronicity     Problem List Patient Active Problem List   Diagnosis Date Noted  . Anemia 11/05/2015  . Health care maintenance 11/17/2014  . Umbilical hernia 38/25/0539  . Neoplasm of left breast, 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  . GERD (gastroesophageal reflux disease) 07/22/2012  . Diverticulosis 07/21/2012  . Hypertension 07/21/2012  . Hypercholesterolemia 07/21/2012  . Abnormal liver function test 07/21/2012  . Left carotid bruit 07/21/2012  . Hyperglycemia 07/21/2012  . Environmental allergies 07/21/2012  . Lumbar disc disease  07/21/2012    Jomarie Longs PT 04/27/2017, 5:43 PM  [facility] [facility] [address], [place], [address] Phone: [phone]   Fax:  [phone]  Name: MAGDALYN ARENIVAS MRN: [id number] Date of Birth: 1946-03-06

## 2017-05-03 ENCOUNTER — Ambulatory Visit: Payer: Medicare Other | Attending: Internal Medicine | Admitting: Physical Therapy

## 2017-05-03 ENCOUNTER — Encounter: Payer: Self-pay | Admitting: Physical Therapy

## 2017-05-03 DIAGNOSIS — M25562 Pain in left knee: Secondary | ICD-10-CM | POA: Insufficient documentation

## 2017-05-03 DIAGNOSIS — M545 Low back pain, unspecified: Secondary | ICD-10-CM

## 2017-05-03 DIAGNOSIS — M25561 Pain in right knee: Secondary | ICD-10-CM | POA: Insufficient documentation

## 2017-05-03 DIAGNOSIS — M6281 Muscle weakness (generalized): Secondary | ICD-10-CM | POA: Insufficient documentation

## 2017-05-03 NOTE — Therapy (Signed)
[place] [facility] [address] [place], [address] Phone: [phone]   Fax:  [phone]  Physical Therapy Treatment  Patient Details  Name: Helen Marshall MRN: [id number] Date of Birth: 05-Sep-1945 Referring Provider: Einar Pheasant MD  Encounter Date: 05/03/2017      PT End of Session - 05/03/17 1000    Visit Number 4   Number of Visits 12   Date for PT Re-Evaluation 05/30/17   Authorization Type 4   Authorization Time Period 10 (G code)   PT Start Time 0908   PT Stop Time 0950   PT Time Calculation (min) 42 min   Activity Tolerance Patient tolerated treatment well   Behavior During Therapy [facility] for tasks assessed/performed      Past Medical History:  Diagnosis Date  . Abnormal LFTs   . Abnormal mammogram 12/19/2012   left  . Allergy   . Anemia   . Cancer (HCC)    Breast  . Carotid bruit    left  . Cataract   . Colon polyps   . Diverticulosis   . Diverticulosis 2012  . Environmental allergies   . GERD (gastroesophageal reflux disease)   . Heart murmur   . Hypercholesterolemia   . Hyperglycemia   . Hypertension   . IBS (irritable bowel syndrome)   . Lipoma of colon   . Lumbar disc disease   . 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  . Tubular adenoma of colon     Past Surgical History:  Procedure Laterality Date  . ABDOMINAL HYSTERECTOMY  1989   fibroids  . ANKLE SURGERY Left 1999  . BREAST SURGERY Left 1970   biopsy  . CATARACT EXTRACTION, BILATERAL  June & July 2017  . CHOLECYSTECTOMY  2001  . COLONOSCOPY  06/02/2011   Verdie Shire, MD; diverticulosis, submucosal lipoma of the sigmoid colon.  Marland Kitchen HERNIA REPAIR  2001, 2004  . HERNIA REPAIR  01/22/2013   Repeat repair of umbilical defect, 2.5 cm. Primary repair.  Marland Kitchen NASAL SINUS SURGERY   1997  . rotator cuff surgery  1998  . SQUAMOUS CELL CARCINOMA EXCISION Right 05/2013   shoulder  . TUBAL LIGATION  1976  . UPPER GI ENDOSCOPY  08/22/14   Dr Lenna Sciara. [place]    There were no vitals filed for this visit.      Subjective Assessment - 05/03/17 0912    Subjective Patient reports she is stiff and sore today due to vacuuming and mopping yesterday.   Pertinent History long history of back pain since helping with taking  pier out of water about 7-10 years ago and has had pain in back on/off since. Her knees have been worsening over the past year. 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    Limitations Standing;House hold activities;Walking;Sitting   How long can you sit comfortably? as long as she wants as long as she has her choice of seating   How long can you stand comfortably? 1 hour   How long can you walk comfortably? short and intermediate distances ok   Diagnostic tests X rays knees; back   Patient Stated Goals to be able to do normal daily tasks without the pain she is having   Currently in Pain? Yes   Pain Score 4    Pain  Location Back   Pain Orientation Right;Left   Pain Descriptors / Indicators Aching   Pain Type Chronic pain   Pain Onset More than a month ago   Pain Frequency Intermittent          Objective: Gait: ambulating into clinic with decreased trunk rotation, decreased arm swing, guarded posture  Treatment: Therapeutic exercise;patient performed with instruction, assistance, verbal cues of therapist: Goal: improve strength, decrease knee pain, back pain, independent with home program Sitting in chair with back supported with moist heat applied during exercises: goal: pain, spasms; no adverse reactions noted Knee flexion with green resistive band 2 x 15 reps each with assistance Knee extension with 2# ankle weights 2 x 15 Ankle DF green resistive band x 20 Ankle eversion bilateral with green band x 20 Ankle PF 4# on thigh 2 x 15 Hip adduction  with glute sets with ball x 15 Hip abduction with resistive band x 25  OMEGA stabilization standing 10# rows and pull to front of thighs 15 reps each Seated reverse chin ups with 15# x 15 reps  Patient response to treatment: Patient demonstrated improved technique with exercises with minimal to moderate cuing for new exercises and repetition for previously performed exercises. Improved gait pattern with decreased guarding noted at end of session. No increased pain reported, mild soreness reported in back at end of session.        PT Education - 05/03/17 1003    Education provided Yes   Education Details exercise intruction for sitting exercises    Person(s) Educated Patient   Methods Explanation;Demonstration;Verbal cues   Comprehension Verbalized understanding;Returned demonstration;Verbal cues required             PT Long Term Goals - 04/18/17 1716      PT LONG TERM GOAL #1   Title  Patient will demonstrate improved function with daily tasks and decreased back pain as indicated by MODI score of 15% or less   Baseline MODI 22%    Status New   Target Date 05/30/17     PT LONG TERM GOAL #2   Title Patient will demonstrate improved posture awareness and pain control strategies to allow patient to sit/stand for >1 hour with pain 4/10 max   Baseline pain ranges from 3/10 up to 10/10 pain and unable to sit/stand >1 hour   Status New   Target Date 05/16/17     PT LONG TERM GOAL #3   Title Patient will demonstrate improved function with LEFS score of 52/80   Baseline LEFS 42/80   Status New   Target Date 05/16/17     PT LONG TERM GOAL #4   Title  Patient will be independent with home program for posture awareness, pain control, progressive exercises to allow patient to transition to self management once discharged from physical therapy   Baseline limited knowledge of appropriate pain control strategies and exercise progression without assistance   Status New   Target  Date 05/30/17     PT LONG TERM GOAL #5   Title Patient will demonstrate improved function with LEFS score of 60/80   Baseline LEFS 42/80   Status New   Target Date 05/30/17               Plan - 05/03/17 1004    Clinical Impression Statement Patient with decresaed soreness in lower back following moist heat and improved gait pattern with decreased guarding at end of session. She required moderate cuing to perform modified exerciess  with good technique with repetition.    Rehab Potential Good   Clinical Impairments Affecting Rehab Potential (+)motivated, active lifestyle (-)chronic condition   PT Frequency 2x / week   PT Duration 6 weeks   PT Treatment/Interventions Moist Heat;Cryotherapy;Patient/family education;Neuromuscular re-education;Therapeutic exercise;Manual techniques   PT Next Visit Plan therapeutic exercise: stabilization, knee and hip strengthening exercises   PT Home Exercise Plan seated SLR, hip adduction with ball and glute sets, hip abduction with resistive band, side lying clam, bridging with ball; sitting calf raise, hip abduction, adduction, knee extension/flexion      Patient will benefit from skilled therapeutic intervention in order to improve the following deficits and impairments:  Decreased strength, Pain, Impaired perceived functional ability, Decreased activity tolerance, Decreased endurance, Increased muscle spasms, Difficulty walking, Decreased range of motion  Visit Diagnosis: Midline low back pain without sciatica, unspecified chronicity  Muscle weakness (generalized)  Left knee pain, unspecified chronicity  Right knee pain, unspecified chronicity     Problem List Patient Active Problem List   Diagnosis Date Noted  . Anemia 11/05/2015  . Health care maintenance 11/17/2014  . Umbilical hernia 74/07/8785  . Neoplasm of left breast, 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  . GERD (gastroesophageal  reflux disease) 07/22/2012  . Diverticulosis 07/21/2012  . Hypertension 07/21/2012  . Hypercholesterolemia 07/21/2012  . Abnormal liver function test 07/21/2012  . Left carotid bruit 07/21/2012  . Hyperglycemia 07/21/2012  . Environmental allergies 07/21/2012  . Lumbar disc disease 07/21/2012    Jomarie Longs PT 05/04/2017, 3:37 PM  [facility] [facility] [address] [place], [address] Phone: [phone]   Fax:  [phone]  Name: DEARIA WILMOUTH MRN: [id number] Date of Birth: 09-11-1945

## 2017-05-10 ENCOUNTER — Encounter: Payer: Medicare Other | Admitting: Physical Therapy

## 2017-05-17 ENCOUNTER — Ambulatory Visit: Payer: Medicare Other | Admitting: Physical Therapy

## 2017-05-17 ENCOUNTER — Encounter: Payer: Self-pay | Admitting: Physical Therapy

## 2017-05-17 DIAGNOSIS — M545 Low back pain, unspecified: Secondary | ICD-10-CM

## 2017-05-17 DIAGNOSIS — M6281 Muscle weakness (generalized): Secondary | ICD-10-CM

## 2017-05-17 DIAGNOSIS — M25562 Pain in left knee: Secondary | ICD-10-CM

## 2017-05-17 NOTE — Therapy (Signed)
[facility] [address] [place], [address] Phone: [phone]   Fax:  [phone]  Physical Therapy Treatment  Patient Details  Name: Helen Marshall MRN: [id number] Date of Birth: Mar 06, 1946 Referring Provider: Einar Pheasant MD  Encounter Date: 05/17/2017      PT End of Session - 05/17/17 1133    Visit Number 5   Number of Visits 12   Date for PT Re-Evaluation 05/30/17   Authorization Type 5   Authorization Time Period 10 (G code)   PT Start Time 1120   PT Stop Time 1200   PT Time Calculation (min) 40 min   Activity Tolerance Patient tolerated treatment well   Behavior During Therapy [facility] for tasks assessed/performed      Past Medical History:  Diagnosis Date  . Abnormal LFTs   . Abnormal mammogram 12/19/2012   left  . Allergy   . Anemia   . Cancer (HCC)    Breast  . Carotid bruit    left  . Cataract   . Colon polyps   . Diverticulosis   . Diverticulosis 2012  . Environmental allergies   . GERD (gastroesophageal reflux disease)   . Heart murmur   . Hypercholesterolemia   . Hyperglycemia   . Hypertension   . IBS (irritable bowel syndrome)   . Lipoma of colon   . Lumbar disc disease   . 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  . Tubular adenoma of colon     Past Surgical History:  Procedure Laterality Date  . ABDOMINAL HYSTERECTOMY  1989   fibroids  . ANKLE SURGERY Left 1999  . BREAST SURGERY Left 1970   biopsy  . CATARACT EXTRACTION, BILATERAL  June & July 2017  . CHOLECYSTECTOMY  2001  . COLONOSCOPY  06/02/2011   Verdie Shire, MD; diverticulosis, submucosal lipoma of the sigmoid colon.  Marland Kitchen HERNIA REPAIR  2001, 2004  . HERNIA REPAIR  01/22/2013   Repeat repair of umbilical defect, 2.5 cm. Primary repair.  Marland Kitchen NASAL SINUS SURGERY   1997  . rotator cuff surgery  1998  . SQUAMOUS CELL CARCINOMA EXCISION Right 05/2013   shoulder  . TUBAL LIGATION  1976  . UPPER GI ENDOSCOPY  08/22/14   Dr Lenna Sciara. [place]    There were no vitals filed for this visit.      Subjective Assessment - 05/17/17 1114    Subjective Patient reports she is having no pain today. She reports she has had pain back of right leg with sitting. She is dealing with family illness and was on phone for 10 min. talking with family to assist with getting medical attention.    Pertinent History long history of back pain since helping with taking  pier out of water about 7-10 years ago and has had pain in back on/off since. Her knees have been worsening over the past year. 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    Limitations Standing;House hold activities;Walking;Sitting   How long can you sit comfortably? as long as she wants as long as she has her choice of seating   How long can you stand comfortably? 1 hour   How long can you walk comfortably? short and intermediate distances ok   Diagnostic tests X rays knees; back   Patient Stated Goals  to be able to do normal daily tasks without the pain she is having   Currently in Pain? Yes   Pain Score 4    Pain Location Back   Pain Orientation Right;Left   Pain Descriptors / Indicators Aching   Pain Type Chronic pain   Pain Onset More than a month ago   Pain Frequency Intermittent         Objective: Observation: patient upset, crying at beginning of session Posture; WNL sitting and standing  Treatment: Therapeutic exercise;patient performed with instruction, assistance, verbal cues of therapist: Goal: improve strength, decrease knee pain, back pain, independent with home program  Hip rotary machine 70# 2 x 15 reps hip extension each LE Hip abduction 55# 2 x 10-15 reps each LE  OMEGA cable exercises Stabilization standing 10# forward modified Palloff press x 15 reps Single row standing 10# x  15 reps each UE Seated bilateral scapular row with 10# x 15 reps Seated chest press 10# x 10 reps Standing straight arm pull downs 15# x 15reps Seated reverse chin ups with 20# x 15 reps  Wall push ups partial ROM with feet close to wall to decrease strain on right shoulder 10 reps  Patient response to treatment: Patient demonstrated improved technique with exercises with minimal to moderate VC and repeatedly demonstration for correct alignment and performance of exercises.           PT Education - 05/17/17 1115    Education provided Yes   Education Details exercise insruction    Person(s) Educated Patient   Methods Explanation;Demonstration;Verbal cues   Comprehension Verbalized understanding;Returned demonstration;Verbal cues required             PT Long Term Goals - 04/18/17 1716      PT LONG TERM GOAL #1   Title  Patient will demonstrate improved function with daily tasks and decreased back pain as indicated by MODI score of 15% or less   Baseline MODI 22%    Status New   Target Date 05/30/17     PT LONG TERM GOAL #2   Title Patient will demonstrate improved posture awareness and pain control strategies to allow patient to sit/stand for >1 hour with pain 4/10 max   Baseline pain ranges from 3/10 up to 10/10 pain and unable to sit/stand >1 hour   Status New   Target Date 05/16/17     PT LONG TERM GOAL #3   Title Patient will demonstrate improved function with LEFS score of 52/80   Baseline LEFS 42/80   Status New   Target Date 05/16/17     PT LONG TERM GOAL #4   Title  Patient will be independent with home program for posture awareness, pain control, progressive exercises to allow patient to transition to self management once discharged from physical therapy   Baseline limited knowledge of appropriate pain control strategies and exercise progression without assistance   Status New   Target Date 05/30/17     PT LONG TERM GOAL #5   Title Patient will  demonstrate improved function with LEFS score of 60/80   Baseline LEFS 42/80   Status New   Target Date 05/30/17               Plan - 05/17/17 1134    Clinical Impression Statement Patient demonstrated improved strength with exercises and was able to advance strengthening for core without increased pain reported. Session was limited today due to having to take care of family issues  for part of session.   Rehab Potential Good   Clinical Impairments Affecting Rehab Potential (+)motivated, active lifestyle (-)chronic condition   PT Frequency 2x / week   PT Duration 6 weeks   PT Treatment/Interventions Moist Heat;Cryotherapy;Patient/family education;Neuromuscular re-education;Therapeutic exercise;Manual techniques   PT Next Visit Plan therapeutic exercise: stabilization, knee and hip strengthening exercises   PT Home Exercise Plan seated SLR, hip adduction with ball and glute sets, hip abduction with resistive band, side lying clam, bridging with ball; sitting calf raise, hip abduction, adduction, knee extension/flexion      Patient will benefit from skilled therapeutic intervention in order to improve the following deficits and impairments:  Decreased strength, Pain, Impaired perceived functional ability, Decreased activity tolerance, Decreased endurance, Increased muscle spasms, Difficulty walking, Decreased range of motion  Visit Diagnosis: Midline low back pain without sciatica, unspecified chronicity  Muscle weakness (generalized)  Left knee pain, unspecified chronicity     Problem List Patient Active Problem List   Diagnosis Date Noted  . Anemia 11/05/2015  . Health care maintenance 11/17/2014  . Umbilical hernia 82/01/155  . Neoplasm of left breast, 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  . GERD (gastroesophageal reflux disease) 07/22/2012  . Diverticulosis 07/21/2012  . Hypertension 07/21/2012  . Hypercholesterolemia  07/21/2012  . Abnormal liver function test 07/21/2012  . Left carotid bruit 07/21/2012  . Hyperglycemia 07/21/2012  . Environmental allergies 07/21/2012  . Lumbar disc disease 07/21/2012    Jomarie Longs PT 05/18/2017, 10:18 AM  [facility] [address] [place], [address] Phone: [phone]   Fax:  [phone]  Name: Helen Marshall MRN: [id number] Date of Birth: [birth year]

## 2017-05-19 ENCOUNTER — Encounter: Payer: Self-pay | Admitting: Physical Therapy

## 2017-05-19 ENCOUNTER — Ambulatory Visit: Payer: Medicare Other | Admitting: Physical Therapy

## 2017-05-19 DIAGNOSIS — M545 Low back pain, unspecified: Secondary | ICD-10-CM

## 2017-05-19 DIAGNOSIS — M25561 Pain in right knee: Secondary | ICD-10-CM

## 2017-05-19 DIAGNOSIS — M6281 Muscle weakness (generalized): Secondary | ICD-10-CM

## 2017-05-19 DIAGNOSIS — M25562 Pain in left knee: Secondary | ICD-10-CM

## 2017-05-20 NOTE — Therapy (Signed)
[facility] [address] [place], [address] Phone: [phone]   Fax:  [phone]  Physical Therapy Treatment  Patient Details  Name: Helen Marshall MRN: [id number] Date of Birth: February 13, 1946 Referring Provider: Einar Pheasant MD  Encounter Date: 05/19/2017      PT End of Session - 05/19/17 1800    Visit Number 6   Number of Visits 12   Date for PT Re-Evaluation 05/30/17   Authorization Type 6   Authorization Time Period 10 (G code)   PT Start Time 1707   PT Stop Time 1740   PT Time Calculation (min) 33 min   Activity Tolerance Patient tolerated treatment well   Behavior During Therapy [facility] for tasks assessed/performed      Past Medical History:  Diagnosis Date  . Abnormal LFTs   . Abnormal mammogram 12/19/2012   left  . Allergy   . Anemia   . Cancer (HCC)    Breast  . Carotid bruit    left  . Cataract   . Colon polyps   . Diverticulosis   . Diverticulosis 2012  . Environmental allergies   . GERD (gastroesophageal reflux disease)   . Heart murmur   . Hypercholesterolemia   . Hyperglycemia   . Hypertension   . IBS (irritable bowel syndrome)   . Lipoma of colon   . Lumbar disc disease   . 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  . Tubular adenoma of colon     Past Surgical History:  Procedure Laterality Date  . ABDOMINAL HYSTERECTOMY  1989   fibroids  . ANKLE SURGERY Left 1999  . BREAST SURGERY Left 1970   biopsy  . CATARACT EXTRACTION, BILATERAL  June & July 2017  . CHOLECYSTECTOMY  2001  . COLONOSCOPY  06/02/2011   Verdie Shire, MD; diverticulosis, submucosal lipoma of the sigmoid colon.  Marland Kitchen HERNIA REPAIR  2001, 2004  . HERNIA REPAIR  01/22/2013   Repeat repair of umbilical defect, 2.5 cm. Primary repair.  Marland Kitchen NASAL SINUS SURGERY   1997  . rotator cuff surgery  1998  . SQUAMOUS CELL CARCINOMA EXCISION Right 05/2013   shoulder  . TUBAL LIGATION  1976  . UPPER GI ENDOSCOPY  08/22/14   Dr Lenna Sciara. [place]    There were no vitals filed for this visit.      Subjective Assessment - 05/19/17 1711    Subjective Patient reports she is having no pain today. she feels the exercises with cable weights are helping and would like to review them today.   Pertinent History long history of back pain since helping with taking  pier out of water about 7-10 years ago and has had pain in back on/off since. Her knees have been worsening over the past year. 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    Limitations Standing;House hold activities;Walking;Sitting   How long can you sit comfortably? as long as she wants as long as she has her choice of seating   How long can you stand comfortably? 1 hour   How long can you walk comfortably? short and intermediate distances ok   Diagnostic tests X rays knees; back   Patient Stated Goals to be able to do normal daily tasks without the pain she is having   Currently in Pain?  No/denies        Objective: Posture; WNL sitting and standing  Treatment: Therapeutic exercise;patient performed with instruction, assistance, verbal cues of therapist: Goal: improve strength, decrease knee pain, back pain, independent with home program  Hip rotary machine 70#2 x 15 reps hip extension each LE Hip abduction 55# 2 x 15 reps each LE  OMEGA cable exercises Stabilization standing 15# forward modified Palloff press x 15 reps Single row standing 10# x 15 reps each UE Seated bilateral scapular row with 10# x 15 reps Seated chest press 10# x 10 reps Standing straight arm pull downs 15# 2 x 15reps Seated reverse chin ups with 20# 2 x 15 reps  Wall push ups partial ROM with feet close to wall to decrease strain on right shoulder 10 reps  Patient response to treatment: Patient demonstrated improved  technique with repetition and moderate cuing for correct posture and technique.           PT Education - 05/19/17 1740    Education provided Yes   Education Details exercise instruction   Person(s) Educated Patient   Methods Explanation;Demonstration;Verbal cues   Comprehension Verbalized understanding;Returned demonstration;Verbal cues required             PT Long Term Goals - 04/18/17 1716      PT LONG TERM GOAL #1   Title  Patient will demonstrate improved function with daily tasks and decreased back pain as indicated by MODI score of 15% or less   Baseline MODI 22%    Status New   Target Date 05/30/17     PT LONG TERM GOAL #2   Title Patient will demonstrate improved posture awareness and pain control strategies to allow patient to sit/stand for >1 hour with pain 4/10 max   Baseline pain ranges from 3/10 up to 10/10 pain and unable to sit/stand >1 hour   Status New   Target Date 05/16/17     PT LONG TERM GOAL #3   Title Patient will demonstrate improved function with LEFS score of 52/80   Baseline LEFS 42/80   Status New   Target Date 05/16/17     PT LONG TERM GOAL #4   Title  Patient will be independent with home program for posture awareness, pain control, progressive exercises to allow patient to transition to self management once discharged from physical therapy   Baseline limited knowledge of appropriate pain control strategies and exercise progression without assistance   Status New   Target Date 05/30/17     PT LONG TERM GOAL #5   Title Patient will demonstrate improved function with LEFS score of 60/80   Baseline LEFS 42/80   Status New   Target Date 05/30/17               Plan - 05/19/17 1745    Clinical Impression Statement Patient demonstrated improved strength and endurance with exercises with progression to increased intensity with less rest periods. she required minimal to moderate guidance and cuing to perform exercises with proper  technique and alignment.   Rehab Potential Good   Clinical Impairments Affecting Rehab Potential (+)motivated, active lifestyle (-)chronic condition   PT Frequency 2x / week   PT Duration 6 weeks   PT Treatment/Interventions Moist Heat;Cryotherapy;Patient/family education;Neuromuscular re-education;Therapeutic exercise;Manual techniques   PT Next Visit Plan therapeutic exercise: stabilization, knee and hip strengthening exercises   PT Home Exercise Plan seated SLR, hip adduction with ball and glute sets, hip abduction with resistive band, side lying  clam, bridging with ball; sitting calf raise, hip abduction, adduction, knee extension/flexion      Patient will benefit from skilled therapeutic intervention in order to improve the following deficits and impairments:  Decreased strength, Pain, Impaired perceived functional ability, Decreased activity tolerance, Decreased endurance, Increased muscle spasms, Difficulty walking, Decreased range of motion  Visit Diagnosis: Midline low back pain without sciatica, unspecified chronicity  Muscle weakness (generalized)  Left knee pain, unspecified chronicity  Right knee pain, unspecified chronicity     Problem List Patient Active Problem List   Diagnosis Date Noted  . Anemia 11/05/2015  . Health care maintenance 11/17/2014  . Umbilical hernia 97/09/6376  . Neoplasm of left breast, 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  . GERD (gastroesophageal reflux disease) 07/22/2012  . Diverticulosis 07/21/2012  . Hypertension 07/21/2012  . Hypercholesterolemia 07/21/2012  . Abnormal liver function test 07/21/2012  . Left carotid bruit 07/21/2012  . Hyperglycemia 07/21/2012  . Environmental allergies 07/21/2012  . Lumbar disc disease 07/21/2012    Jomarie Longs PT 05/20/2017, 10:29 PM  [facility] [address], [place],  [address] Phone: [phone]   Fax:  [phone]  Name: Helen Marshall MRN: [id number] Date of Birth: 1946/05/01

## 2017-05-24 ENCOUNTER — Encounter: Payer: Self-pay | Admitting: Physical Therapy

## 2017-05-24 ENCOUNTER — Ambulatory Visit: Payer: Medicare Other | Admitting: Physical Therapy

## 2017-05-24 DIAGNOSIS — M6281 Muscle weakness (generalized): Secondary | ICD-10-CM

## 2017-05-24 DIAGNOSIS — M545 Low back pain, unspecified: Secondary | ICD-10-CM

## 2017-05-24 DIAGNOSIS — M25562 Pain in left knee: Secondary | ICD-10-CM

## 2017-05-24 DIAGNOSIS — M25561 Pain in right knee: Secondary | ICD-10-CM

## 2017-05-24 NOTE — Therapy (Addendum)
[facility] [address] [place], [place], [address] Phone: [phone]   Fax:  [phone]  Physical Therapy Treatment  Patient Details  Name: Helen Marshall MRN: [id number] Date of Birth: 14-Mar-1946 Referring Provider: Einar Pheasant MD  Encounter Date: 05/24/2017       PT End of Session - 05/24/17 1037     Visit Number 7   Number of Visits 12   Date for PT Re-Evaluation 05/30/17   Authorization Type 7   Authorization Time Period 10 (G code)   PT Start Time [phone]   PT Stop Time 1017   PT Time Calculation (min) 40 min   Activity Tolerance Patient tolerated treatment well   Behavior During Therapy [facility] for tasks assessed/performed       Past Medical History:  Diagnosis Date   Abnormal LFTs    Abnormal mammogram 12/19/2012   left   Allergy    Anemia    Cancer ([place])    Breast   Carotid bruit    left   Cataract    Colon polyps    Diverticulosis    Diverticulosis 2012   Environmental allergies    GERD (gastroesophageal reflux disease)    Heart murmur    Hypercholesterolemia    Hyperglycemia    Hypertension    IBS (irritable bowel syndrome)    Lipoma of colon    Lumbar disc disease    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   Tubular adenoma of colon     Past Surgical History:  Procedure Laterality Date   ABDOMINAL HYSTERECTOMY  1989   fibroids   ANKLE SURGERY Left 1999   BREAST SURGERY Left 1970   biopsy   CATARACT EXTRACTION, BILATERAL  June & July 2017   CHOLECYSTECTOMY  2001   COLONOSCOPY  06/02/2011   Verdie Shire, MD; diverticulosis, submucosal lipoma of the sigmoid colon.   HERNIA REPAIR  2001, 2004   HERNIA REPAIR  01/22/2013   Repeat repair of umbilical defect, 2.5 cm. Primary repair.   NASAL SINUS SURGERY  1997   rotator cuff  surgery  1998   SQUAMOUS CELL CARCINOMA EXCISION Right 05/2013   shoulder   TUBAL LIGATION  1976   UPPER GI ENDOSCOPY  08/22/14   Dr Lenna Sciara. Hilarie Fredrickson    There were no vitals filed for this visit.       Subjective Assessment - 05/24/17 0939     Subjective Patient reports not having much pain today and is more isolated in left shoulder. She reports vacuuming and cleaning house with noticeable increased soreness in back and left shoulder.   Pertinent History long history of back pain since helping with taking  pier out of water about 7-10 years ago and has had pain in back on/off since. Her knees have been worsening over the past year. 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    Limitations Standing;House hold activities;Walking;Sitting   How long can you sit comfortably? as long as she wants as long as she has her choice of seating   How long can you stand comfortably? 1 hour   How long can you walk comfortably? short and intermediate distances ok   Diagnostic tests X rays knees; back   Patient Stated Goals to be able to do normal daily tasks without  the pain she is having   Currently in Pain? No/denies         Objective: Posture; WNL sitting and standing   Treatment: Therapeutic exercise; patient performed with instruction, assistance, verbal cues of therapist: Goal: improve strength, decrease knee pain, back pain, independent with home program    Hip rotary machine 85# 2 x 15 reps hip extension each LE Hip abduction 55# 2 x 15 reps each LE   OMEGA cable exercises Stabilization standing 15# forward modified Palloff press x 15 reps Single row standing 10# x 15 reps each UE Seated bilateral scapular row with 15# x 15 reps Seated chest press 10# x 10 reps, 5# x 10 reps Standing straight arm pull downs 15# 2 x 15reps Seated reverse chin ups with 20# 2 x 15 reps   Wall push ups partial ROM with feet close to wall to decrease strain on right shoulder 10 reps   Patient response  to treatment: Patient demonstrated improved technique with repetition and moderate cuing for correct posture and technique.             PT Education - 05/24/17 1035     Education provided Yes   Education Details Exercise instruction    Person(s) Educated Patient   Methods Explanation;Verbal cues   Comprehension Verbalized understanding;Verbal cues required               PT Long Term Goals - 04/18/17 1716       PT LONG TERM GOAL #1   Title  Patient will demonstrate improved function with daily tasks and decreased back pain as indicated by MODI score of 15% or less   Baseline MODI 22%    Status New   Target Date 05/30/17     PT LONG TERM GOAL #2   Title Patient will demonstrate improved posture awareness and pain control strategies to allow patient to sit/stand for >1 hour with pain 4/10 max   Baseline pain ranges from 3/10 up to 10/10 pain and unable to sit/stand >1 hour   Status New   Target Date 05/16/17     PT LONG TERM GOAL #3   Title Patient will demonstrate improved function with LEFS score of 52/80   Baseline LEFS 42/80   Status New   Target Date 05/16/17     PT LONG TERM GOAL #4   Title  Patient will be independent with home program for posture awareness, pain control, progressive exercises to allow patient to transition to self management once discharged from physical therapy   Baseline limited knowledge of appropriate pain control strategies and exercise progression without assistance   Status New   Target Date 05/30/17     PT LONG TERM GOAL #5   Title Patient will demonstrate improved function with LEFS score of 60/80   Baseline LEFS 42/80   Status New   Target Date 05/30/17                 Plan - 05/24/17 1039     Clinical Impression Statement Patient is progressing well towards goals with improved strength and endurance as demonstrated with increased intensity of exercise and minimal rest between exercises. She requires less cuing to  perform exercises with correct posture, positioning and technique and should continue to progress with additional physical therapy intervention.    Rehab Potential Good   Clinical Impairments Affecting Rehab Potential (+)motivated, active lifestyle (-)chronic condition   PT Frequency 2x / week   PT Duration 6 weeks  PT Treatment/Interventions Moist Heat;Cryotherapy;Patient/family education;Neuromuscular re-education;Therapeutic exercise;Manual techniques   PT Next Visit Plan therapeutic exercise: stabilization, knee and hip strengthening exercises   PT Home Exercise Plan seated SLR, hip adduction with ball and glute sets, hip abduction with resistive band, side lying clam, bridging with ball; sitting calf raise, hip abduction, adduction, knee extension/flexion       Patient will benefit from skilled therapeutic intervention in order to improve the following deficits and impairments:  Decreased strength, Pain, Impaired perceived functional ability, Decreased activity tolerance, Decreased endurance, Increased muscle spasms, Difficulty walking, Decreased range of motion  Visit Diagnosis: Midline low back pain without sciatica, unspecified chronicity  Muscle weakness (generalized)  Left knee pain, unspecified chronicity  Right knee pain, unspecified chronicity     Problem List Patient Active Problem List   Diagnosis Date Noted   Anemia 11/05/2015   Health care maintenance 28/41/3244   Umbilical hernia 08/04/7251   Neoplasm of left breast, 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   GERD (gastroesophageal reflux disease) 07/22/2012   Diverticulosis 07/21/2012   Hypertension 07/21/2012   Hypercholesterolemia 07/21/2012   Abnormal liver function test 07/21/2012   Left carotid bruit 07/21/2012   Hyperglycemia 07/21/2012   Environmental allergies 07/21/2012   Lumbar disc disease 07/21/2012    Jomarie Longs PT 05/24/2017, 10:43 AM  [facility] [address], [place], [address] Phone: [phone]   Fax:  [phone]  Name: Helen Marshall MRN: [id number] Date of Birth: Apr 12, 1946   PHYSICAL THERAPY DISCHARGE SUMMARY  Visits from Start of Care: 7 Plan: Patient agrees to discharge.  Patient goals were partially met. Patient is being discharged due to not returning after visit 7  Signing therapist doing d/c only, and did not see/treat patient.  Lyndee Hensen, PT, DPT 11:39 AM  09/08/22 .

## 2017-05-26 ENCOUNTER — Ambulatory Visit: Payer: Medicare Other | Admitting: Physical Therapy

## 2017-05-28 ENCOUNTER — Other Ambulatory Visit: Payer: Self-pay | Admitting: Internal Medicine

## 2017-06-27 ENCOUNTER — Telehealth: Payer: Self-pay | Admitting: Internal Medicine

## 2017-06-27 MED ORDER — BENAZEPRIL HCL 40 MG PO TABS: 40.0000 mg | ORAL_TABLET | Freq: Every day | ORAL | 1 refills | Status: DC

## 2017-06-27 MED ORDER — INTEGRA 62.5-62.5-40-3 MG PO CAPS: 1.0000 | ORAL_CAPSULE | Freq: Every day | ORAL | 2 refills | Status: DC

## 2017-06-27 NOTE — Telephone Encounter (Signed)
Copied from [place]. Topic: Quick Communication - See Telephone Encounter >> Jun 27, 2017  8:51 AM Robina Ade, Helene Kelp D wrote: CRM for notification. See Telephone encounter for: 06/27/17. Patient needs refill on her Fe Fum-FePoly-Vit C-Vit B3 (INTEGRA) 62.5-62.5-40-3 MG CAPS and benazepril (LOTENSIN) 40 MG tablet and send it to [facility]. Patient no longer will be getting her medication from [facility].

## 2017-06-27 NOTE — Telephone Encounter (Signed)
Pt called to verify medications refilled are to go to [facility]. No other concerns voiced at this time.

## 2017-07-12 ENCOUNTER — Ambulatory Visit: Payer: Medicare Other

## 2017-07-15 ENCOUNTER — Ambulatory Visit: Payer: Medicare Other

## 2017-07-19 ENCOUNTER — Encounter: Payer: Self-pay | Admitting: Internal Medicine

## 2017-07-19 ENCOUNTER — Ambulatory Visit: Payer: Medicare Other | Admitting: Internal Medicine

## 2017-07-19 DIAGNOSIS — I10 Essential (primary) hypertension: Secondary | ICD-10-CM | POA: Diagnosis not present

## 2017-07-19 DIAGNOSIS — J329 Chronic sinusitis, unspecified: Secondary | ICD-10-CM

## 2017-07-19 MED ORDER — INTEGRA 62.5-62.5-40-3 MG PO CAPS: 1.0000 | ORAL_CAPSULE | Freq: Every day | ORAL | 2 refills | Status: DC

## 2017-07-19 MED ORDER — AMOXICILLIN 875 MG PO TABS
875.0000 mg | ORAL_TABLET | Freq: Two times a day (BID) | ORAL | 0 refills | Status: DC
Start: 2017-07-19 — End: 2017-08-09

## 2017-07-19 NOTE — Patient Instructions (Signed)
Saline nasal spray - flush nose at least 2-3x/day  flonase nasal spray - 2 sprays each nostril one time per day  Do this in the evening.    Take a probiotic daily while you are on the antibiotic and for two weeks after completing the antibiotics.    Continue mucinex.

## 2017-07-19 NOTE — Progress Notes (Signed)
Patient ID: Helen Marshall, female   DOB: 02-20-1946, 71 y.o.   MRN: [id number]   Subjective:    Patient ID: Helen Marshall, female    DOB: [birth year], 71 y.o.   MRN: [id number]  HPI  Patient here for work in appt with concerns regarding a possible sinus infection.  Reports symptoms started two weeks ago.  Reports increased sinus pressure and congestion.  Increased sinus pressure/sinus headache.  Occasional light headedness.  Increased drainage.  Increased cough - productive of yellow mucus production.  No fever.  Taking mucinex and claritin.  Symptoms progressing.  Eating.  No vomiting.  Bowels stable.     Past Medical History:  Diagnosis Date  . Abnormal LFTs   . Abnormal mammogram 12/19/2012   left  . Allergy   . Anemia   . Cancer (HCC)    Breast  . Carotid bruit    left  . Cataract   . Colon polyps   . Diverticulosis   . Diverticulosis 2012  . Environmental allergies   . GERD (gastroesophageal reflux disease)   . Heart murmur   . Hypercholesterolemia   . Hyperglycemia   . Hypertension   . IBS (irritable bowel syndrome)   . Lipoma of colon   . Lumbar disc disease   . 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  . Tubular adenoma of colon    Past Surgical History:  Procedure Laterality Date  . ABDOMINAL HYSTERECTOMY  1989   fibroids  . ANKLE SURGERY Left 1999  . BREAST SURGERY Left 1970   biopsy  . CATARACT EXTRACTION, BILATERAL  June & July 2017  . CHOLECYSTECTOMY  2001  . COLONOSCOPY  06/02/2011   Verdie Shire, MD; diverticulosis, submucosal lipoma of the sigmoid colon.  Marland Kitchen HERNIA REPAIR  2001, 2004  . HERNIA REPAIR  01/22/2013   Repeat repair of umbilical defect, 2.5 cm. Primary repair.  Marland Kitchen NASAL SINUS SURGERY  1997  . rotator cuff surgery  1998  . SQUAMOUS CELL CARCINOMA EXCISION Right 05/2013   shoulder  . TUBAL  LIGATION  1976  . UPPER GI ENDOSCOPY  08/22/14   Dr Billie Lade   Family History  Problem Relation Age of Onset  . CVA Father   . Alcohol abuse Father   . Heart disease Mother        myocardial infarction  . Diabetes Mother   . Hypertension Brother        x2  . Alcohol abuse Brother   . Arthritis/Rheumatoid Sister        x2  . Breast cancer Sister 10  . Osteoarthritis Sister   . Asthma Sister   . Hypothyroidism Sister   . Cancer Other        breast - paternal first cousin  . Colon cancer Maternal Uncle   . Colon polyps Son   . Colon polyps Sister    Social History   Socioeconomic History  . Marital status: Married    Spouse name: None  . Number of children: 2  . Years of education: None  . Highest education level: None  Social Needs  . Financial resource strain: None  . Food insecurity - worry: None  . Food insecurity - inability: None  . Transportation needs - medical: None  . Transportation needs - non-medical: None  Occupational History  . Occupation:  Retired  Tobacco Use  . Smoking status: Never Smoker  . Smokeless tobacco: Never Used  Substance and Sexual Activity  . Alcohol use: No    Alcohol/week: 0.0 oz  . Drug use: No  . Sexual activity: No  Other Topics Concern  . None  Social History Narrative  . None    Outpatient Encounter Medications as of 07/19/2017  Medication Sig  . amLODipine (NORVASC) 5 MG tablet TAKE 1 TABLET BY MOUTH ONCE DAILY  . benazepril (LOTENSIN) 40 MG tablet Take 1 tablet (40 mg total) by mouth daily.  . calcium-vitamin D (CALCIUM 500+D) 500-400 MG-UNIT per tablet Take 1 tablet by mouth daily.   Marland Kitchen EPIPEN 2-PAK 0.3 MG/0.3ML SOAJ injection   . Fe Fum-FePoly-Vit C-Vit B3 (INTEGRA) 62.5-62.5-40-3 MG CAPS Take 1 capsule by mouth daily.  . fish oil-omega-3 fatty acids 1000 MG capsule 5 (five) times daily.   . fluticasone (FLONASE) 50 MCG/ACT nasal spray Place 2 sprays into both nostrils as needed.  . hydrochlorothiazide (HYDRODIURIL)  25 MG tablet TAKE 1 TABLET BY MOUTH ONCE DAILY  . Multiple Vitamin (MULTIVITAMIN) tablet Take 1 tablet by mouth daily.  . niacin (NIASPAN) 1000 MG CR tablet Take 1,000 mg by mouth 2 (two) times daily.  . potassium chloride (K-DUR) 10 MEQ tablet TAKE 1 TABLET BY MOUTH TWICE DAILY  . PROAIR HFA 108 (90 Base) MCG/ACT inhaler Inhale 2 puffs into the lungs every 4 (four) hours as needed.   . Probiotic Product (VSL#3 PO) Take by mouth. 1 capsul daily  . ranitidine (ZANTAC) 150 MG tablet TAKE 1 TABLET BY MOUTH TWICE DAILY AS NEEDED FOR HEARTBURN.  . tamoxifen (NOLVADEX) 20 MG tablet Take 1 tablet (20 mg total) by mouth daily.  Marland Kitchen triamcinolone cream (KENALOG) 0.1 % Apply 1 application topically as needed.   . [DISCONTINUED] Fe Fum-FePoly-Vit C-Vit B3 (INTEGRA) 62.5-62.5-40-3 MG CAPS Take 1 capsule by mouth daily.  Marland Kitchen amoxicillin (AMOXIL) 875 MG tablet Take 1 tablet (875 mg total) by mouth 2 (two) times daily.   No facility-administered encounter medications on file as of 07/19/2017.     Review of Systems  Constitutional: Negative for appetite change and unexpected weight change.  HENT: Positive for congestion, postnasal drip and sinus pressure.   Respiratory: Positive for cough. Negative for chest tightness and shortness of breath.   Cardiovascular: Negative for chest pain, palpitations and leg swelling.  Gastrointestinal: Negative for abdominal pain, nausea and vomiting.  Skin: Negative for color change and rash.  Neurological: Positive for light-headedness.       Sinus headache       Objective:    Physical Exam  Constitutional: She appears well-developed and well-nourished. No distress.  HENT:  Mouth/Throat: Oropharynx is clear and moist.  Nares - slightly erythematous turbinates.  Minimal tenderness to palpation over the sinuses.  TMs without erythema.    Eyes: Conjunctivae are normal. Right eye exhibits no discharge. Left eye exhibits no discharge.  Neck: Neck supple.  Cardiovascular:  Normal rate and regular rhythm.  Pulmonary/Chest: Breath sounds normal. No respiratory distress. She has no wheezes.  Lymphadenopathy:    She has no cervical adenopathy.  Skin: No rash noted. No erythema.    BP 136/70   Pulse 83   Temp 98.1 F (36.7 C) (Oral)   Ht 5\' 3"  (1.6 m)   Wt 180 lb (81.6 kg)   LMP 07/21/1988   SpO2 95%   BMI 31.89 kg/m  Wt Readings from Last 3 Encounters:  07/19/17 180  lb (81.6 kg)  03/30/17 179 lb 3.2 oz (81.3 kg)  11/19/16 181 lb 6.4 oz (82.3 kg)     Lab Results  Component Value Date   WBC 5.0 11/15/2016   HGB 13.2 11/15/2016   HCT 39.0 11/15/2016   PLT 193.0 11/15/2016   GLUCOSE 105 (H) 03/28/2017   CHOL 200 03/28/2017   TRIG 116.0 03/28/2017   HDL 75.50 03/28/2017   LDLDIRECT 101.4 05/07/2013   LDLCALC 102 (H) 03/28/2017   ALT 19 03/28/2017   AST 24 03/28/2017   NA 138 03/28/2017   K 3.8 03/28/2017   CL 102 03/28/2017   CREATININE 0.66 03/28/2017   BUN 11 03/28/2017   CO2 31 03/28/2017   TSH 1.43 03/28/2017   HGBA1C 5.5 03/28/2017   MICROALBUR 0.5 01/16/2014       Assessment & Plan:   Problem List Items Addressed This Visit    Hypertension    Blood pressure under good control.  Continue same medication regimen.  Follow pressures.  Follow metabolic panel.        Sinusitis    Persistent/worsening infection.  Concern over bacterial infection.  Saline nasal spray and steroid nasal spray as directed.  Mucinex/robitussin as directed.  Amoxicillin.  Take probiotic as directed. Follow.        Relevant Medications   amoxicillin (AMOXIL) 875 MG tablet       Einar Pheasant, MD

## 2017-07-21 ENCOUNTER — Encounter: Payer: Self-pay | Admitting: Internal Medicine

## 2017-07-21 DIAGNOSIS — J329 Chronic sinusitis, unspecified: Secondary | ICD-10-CM | POA: Insufficient documentation

## 2017-07-21 NOTE — Assessment & Plan Note (Signed)
Persistent/worsening infection.  Concern over bacterial infection.  Saline nasal spray and steroid nasal spray as directed.  Mucinex/robitussin as directed.  Amoxicillin.  Take probiotic as directed. Follow.

## 2017-07-21 NOTE — Assessment & Plan Note (Signed)
Blood pressure under good control.  Continue same medication regimen.  Follow pressures.  Follow metabolic panel.   

## 2017-08-08 ENCOUNTER — Encounter: Payer: Self-pay | Admitting: General Surgery

## 2017-08-09 ENCOUNTER — Other Ambulatory Visit: Payer: Self-pay

## 2017-08-09 ENCOUNTER — Ambulatory Visit (INDEPENDENT_AMBULATORY_CARE_PROVIDER_SITE_OTHER): Payer: Medicare Other

## 2017-08-09 VITALS — BP 110/70 | HR 73 | Temp 98.4°F | Resp 14 | Ht 62.5 in | Wt 180.0 lb

## 2017-08-09 DIAGNOSIS — Z0000 Encounter for general adult medical examination without abnormal findings: Secondary | ICD-10-CM

## 2017-08-09 DIAGNOSIS — Z1331 Encounter for screening for depression: Secondary | ICD-10-CM

## 2017-08-09 MED ORDER — PROAIR HFA 108 (90 BASE) MCG/ACT IN AERS: 2.0000 | INHALATION_SPRAY | RESPIRATORY_TRACT | 0 refills | Status: DC | PRN

## 2017-08-09 NOTE — Progress Notes (Signed)
Subjective:   Helen Marshall is a 72 y.o. female who presents for Medicare Annual (Subsequent) preventive examination.  Review of Systems:  No ROS.  Medicare Wellness Visit. Additional risk factors are reflected in the social history.  Cardiac Risk Factors include: advanced age (>62men, >26 women);hypertension     Objective:     Vitals: BP 110/70 (BP Location: Left Arm, Patient Position: Sitting, Cuff Size: Normal)   Pulse 73   Temp 98.4 F (36.9 C) (Oral)   Resp 14   Ht 5' 2.5" (1.588 m)   Wt 180 lb (81.6 kg)   LMP 07/21/1988   SpO2 96%   BMI 32.40 kg/m   Body mass index is 32.4 kg/m.  Advanced Directives 08/09/2017 04/18/2017 07/12/2016 03/04/2016 02/12/2016 07/17/2015 08/22/2014  Does Patient Have a Medical Advance Directive? Yes Yes Yes Yes Yes Yes Yes  Type of Paramedic of [place];Living will [address];Living will [place];Living will Living will;Healthcare Power of [place];Living will [place];Living will [place];Living will  Does patient want to make changes to medical advance directive? No - Patient declined - No - Patient declined - No - Patient declined No - Patient declined -  Copy of [place] in Chart? No - copy requested - No - copy requested - No - copy requested No - copy requested -    Tobacco Social History   Tobacco Use  Smoking Status Never Smoker  Smokeless Tobacco Never Used     Counseling given: Not Answered   Clinical Intake:  Pre-visit preparation completed: Yes  Pain : No/denies pain     Nutritional Status: BMI > 30  Obese Diabetes: No  How often do you need to have someone help you when you read instructions, pamphlets, or other written materials from your doctor or pharmacy?: 1 - Never  Interpreter Needed?: No     Past Medical History:  Diagnosis Date  . Abnormal LFTs   .  Abnormal mammogram 12/19/2012   left  . Allergy   . Anemia   . Cancer (HCC)    Breast  . Carotid bruit    left  . Cataract   . Colon polyps   . Diverticulosis   . Diverticulosis 2012  . Environmental allergies   . GERD (gastroesophageal reflux disease)   . Heart murmur   . Hypercholesterolemia   . Hyperglycemia   . Hypertension   . IBS (irritable bowel syndrome)   . Lipoma of colon   . Lumbar disc disease   . 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  . Tubular adenoma of colon    Past Surgical History:  Procedure Laterality Date  . ABDOMINAL HYSTERECTOMY  1989   fibroids  . ANKLE SURGERY Left 1999  . BREAST SURGERY Left 1970   biopsy  . CATARACT EXTRACTION, BILATERAL  June & July 2017  . CHOLECYSTECTOMY  2001  . COLONOSCOPY  06/02/2011   Verdie Shire, MD; diverticulosis, submucosal lipoma of the sigmoid colon.  Marland Kitchen HERNIA REPAIR  2001, 2004  . HERNIA REPAIR  01/22/2013   Repeat repair of umbilical defect, 2.5 cm. Primary repair.  Marland Kitchen NASAL SINUS SURGERY  1997  . rotator cuff surgery  1998  . SQUAMOUS CELL CARCINOMA EXCISION Right 05/2013   shoulder  .  TUBAL LIGATION  1976  . UPPER GI ENDOSCOPY  08/22/14   Dr Billie Lade   Family History  Problem Relation Age of Onset  . CVA Father   . Alcohol abuse Father   . Heart disease Mother        myocardial infarction  . Diabetes Mother   . Hypertension Brother        x2  . Alcohol abuse Brother   . Arthritis/Rheumatoid Sister        x2  . Breast cancer Sister 61  . Osteoarthritis Sister   . Asthma Sister   . Hypothyroidism Sister   . Cancer Other        breast - paternal first cousin  . Colon cancer Maternal Uncle   . Colon polyps Son   . Colon polyps Sister    Social History   Socioeconomic History  . Marital status: Married    Spouse name: None  . Number of  children: 2  . Years of education: None  . Highest education level: None  Social Needs  . Financial resource strain: None  . Food insecurity - worry: None  . Food insecurity - inability: None  . Transportation needs - medical: None  . Transportation needs - non-medical: None  Occupational History  . Occupation: Retired  Tobacco Use  . Smoking status: Never Smoker  . Smokeless tobacco: Never Used  Substance and Sexual Activity  . Alcohol use: No    Alcohol/week: 0.0 oz  . Drug use: No  . Sexual activity: No  Other Topics Concern  . None  Social History Narrative  . None    Outpatient Encounter Medications as of 08/09/2017  Medication Sig  . amLODipine (NORVASC) 5 MG tablet TAKE 1 TABLET BY MOUTH ONCE DAILY  . benazepril (LOTENSIN) 40 MG tablet Take 1 tablet (40 mg total) by mouth daily.  . calcium-vitamin D (CALCIUM 500+D) 500-400 MG-UNIT per tablet Take 1 tablet by mouth daily.   Marland Kitchen EPIPEN 2-PAK 0.3 MG/0.3ML SOAJ injection   . Fe Fum-FePoly-Vit C-Vit B3 (INTEGRA) 62.5-62.5-40-3 MG CAPS Take 1 capsule by mouth daily.  . fish oil-omega-3 fatty acids 1000 MG capsule 5 (five) times daily.   . fluticasone (FLONASE) 50 MCG/ACT nasal spray Place 2 sprays into both nostrils as needed.  . hydrochlorothiazide (HYDRODIURIL) 25 MG tablet TAKE 1 TABLET BY MOUTH ONCE DAILY  . Multiple Vitamin (MULTIVITAMIN) tablet Take 1 tablet by mouth daily.  . niacin (NIASPAN) 1000 MG CR tablet Take 1,000 mg by mouth 2 (two) times daily.  . potassium chloride (K-DUR) 10 MEQ tablet TAKE 1 TABLET BY MOUTH TWICE DAILY  . PROAIR HFA 108 (90 Base) MCG/ACT inhaler Inhale 2 puffs into the lungs every 4 (four) hours as needed.   . Probiotic Product (VSL#3 PO) Take by mouth. 1 capsul daily  . ranitidine (ZANTAC) 150 MG tablet TAKE 1 TABLET BY MOUTH TWICE DAILY AS NEEDED FOR HEARTBURN.  . tamoxifen (NOLVADEX) 20 MG tablet Take 1 tablet (20 mg total) by mouth daily.  Marland Kitchen triamcinolone cream (KENALOG) 0.1 % Apply 1  application topically as needed.   . [DISCONTINUED] amoxicillin (AMOXIL) 875 MG tablet Take 1 tablet (875 mg total) by mouth 2 (two) times daily.   No facility-administered encounter medications on file as of 08/09/2017.     Activities of Daily Living In your present state of health, do you have any difficulty performing the following activities: 08/09/2017  Hearing? N  Vision? N  Difficulty concentrating or making  decisions? N  Walking or climbing stairs? N  Dressing or bathing? N  Doing errands, shopping? N  Preparing Food and eating ? N  Using the Toilet? N  In the past six months, have you accidently leaked urine? N  Do you have problems with loss of bowel control? N  Managing your Medications? N  Managing your Finances? N  Housekeeping or managing your Housekeeping? N  Some recent data might be hidden    Patient Care Team: Einar Pheasant, MD as PCP - General (Internal Medicine) Bary Castilla Forest Gleason, MD (General Surgery)    Assessment:   This is a routine wellness examination for [employer]. The goal of the wellness visit is to assist the patient how to close the gaps in care and create a preventative care plan for the patient.   The roster of all physicians providing medical care to patient is listed in the Snapshot section of the chart.  Taking calcium VIT D as appropriate/Osteoporosis risk reviewed.    Safety issues reviewed; Smoke and carbon monoxide detectors in the home. No firearms in the home.  Wears seatbelts when driving or riding with others. Patient does wear sunscreen or protective clothing when in direct sunlight. No violence in the home.  Depression- PHQ 2 &9 complete.  No signs/symptoms or verbal communication regarding little pleasure in doing things, feeling down, depressed or hopeless. No changes in sleeping, energy, eating, concentrating.  No thoughts of self harm or harm towards others.  Time spent on this topic is 10 minutes.   Patient is alert, normal  appearance, oriented to person/place/and time. Correctly identified the president of the [place], recall of 3/3 words, and performing simple calculations. Displays appropriate judgement and can read correct time from watch face.   No new identified risk were noted.  No failures at ADL's or IADL's.    BMI- discussed the importance of a healthy diet, water intake and the benefits of aerobic exercise. Educational material provided.   24 hour diet recall: Regular diet  Dental- every 6  months.  Eye- Visual acuity not assessed per patient preference since they have regular follow up with the ophthalmologist.  Wears corrective lenses.  Sleep patterns- Sleeps 7-8 hours at night.  Wakes feeling rested  Health maintenance gaps- closed.  Patient Concerns: None at this time. Follow up with PCP as needed.  Exercise Activities and Dietary recommendations Current Exercise Habits: Home exercise routine, Time (Minutes): 20, Frequency (Times/Week): 2, Weekly Exercise (Minutes/Week): 40, Intensity: Mild  Goals    . Increase physical activity     Stay active and exercise.  Chair exercises as demonstrated.  Increase as tolerated.  Education provided.         Fall Risk Fall Risk  08/09/2017 07/19/2017 07/12/2016 11/05/2015 07/17/2015  Falls in the past year? No No No No No    Depression Screen PHQ 2/9 Scores 08/09/2017 07/19/2017 07/12/2016 11/05/2015  PHQ - 2 Score 0 0 0 0  PHQ- 9 Score 0 - - -     Cognitive Function MMSE - Mini Mental State Exam 08/09/2017 07/12/2016 07/17/2015  Orientation to time 5 5 5   Orientation to Place 5 5 5   Registration 3 3 3   Attention/ Calculation 5 5 5   Recall 3 3 3   Language- name 2 objects 2 2 2   Language- repeat 1 1 1   Language- follow 3 step command 3 3 3   Language- read & follow direction 1 1 1   Write a sentence 1 1 1   Copy  design 1 1 1   Total score 30 30 30         Immunization History  Administered Date(s) Administered  . Influenza Split 05/16/2012  .  Influenza, High Dose Seasonal PF 05/15/2016, 03/30/2017  . Influenza,inj,Quad PF,6+ Mos 05/09/2013, 05/23/2014, 07/03/2015  . Pneumococcal Conjugate-13 09/13/2013  . Pneumococcal Polysaccharide-23 11/08/2014  . Tdap 04/26/2014  . Zoster 08/02/2009   Screening Tests Health Maintenance  Topic Date Due  . MAMMOGRAM  08/08/2018  . COLONOSCOPY  03/04/2021  . TETANUS/TDAP  04/26/2024  . INFLUENZA VACCINE  Completed  . DEXA SCAN  Completed  . Hepatitis C Screening  Completed  . PNA vac Low Risk Adult  Completed      Plan:    End of life planning; Advance aging; Advanced directives discussed. Copy of current HCPOA/Living Will requested.    I have personally reviewed and noted the following in the patient's chart:   . Medical and social history . Use of alcohol, tobacco or illicit drugs  . Current medications and supplements . Functional ability and status . Nutritional status . Physical activity . Advanced directives . List of other physicians . Hospitalizations, surgeries, and ER visits in previous 12 months . Vitals . Screenings to include cognitive, depression, and falls . Referrals and appointments  In addition, I have reviewed and discussed with patient certain preventive protocols, quality metrics, and best practice recommendations. A written personalized care plan for preventive services as well as general preventive health recommendations were provided to patient.     Varney Biles, LPN  5/0/7225   Reviewed above information.  Agree with assessment and plan.    Dr Nicki Reaper

## 2017-08-09 NOTE — Patient Instructions (Addendum)
  Helen Marshall , Thank you for taking time to come for your Medicare Wellness Visit. I appreciate your ongoing commitment to your health goals. Please review the following plan we discussed and let me know if I can assist you in the future.   Follow up with Dr. Nicki Reaper as needed.    Bring a copy of your [place] and/or Living Will to be scanned into chart.  Have a great day!  These are the goals we discussed: Goals    . Increase physical activity     Stay active and exercise.  Chair exercises as demonstrated.  Increase as tolerated.  Education provided.         This is a list of the screening recommended for you and due dates:  Health Maintenance  Topic Date Due  . Mammogram  08/08/2018  . Colon Cancer Screening  03/04/2021  . Tetanus Vaccine  04/26/2024  . Flu Shot  Completed  . DEXA scan (bone density measurement)  Completed  .  Hepatitis C: One time screening is recommended by Center for Disease Control  (CDC) for  adults born from [birth year] through 1966.   Completed  . Pneumonia vaccines  Completed

## 2017-08-11 ENCOUNTER — Encounter: Payer: Self-pay | Admitting: Internal Medicine

## 2017-08-11 ENCOUNTER — Ambulatory Visit: Payer: Medicare Other | Admitting: Internal Medicine

## 2017-08-11 ENCOUNTER — Other Ambulatory Visit (INDEPENDENT_AMBULATORY_CARE_PROVIDER_SITE_OTHER): Payer: Medicare Other

## 2017-08-11 DIAGNOSIS — I10 Essential (primary) hypertension: Secondary | ICD-10-CM

## 2017-08-11 DIAGNOSIS — R739 Hyperglycemia, unspecified: Secondary | ICD-10-CM

## 2017-08-11 DIAGNOSIS — E7800 Pure hypercholesterolemia, unspecified: Secondary | ICD-10-CM

## 2017-08-11 DIAGNOSIS — R7989 Other specified abnormal findings of blood chemistry: Secondary | ICD-10-CM

## 2017-08-11 DIAGNOSIS — K5730 Diverticulosis of large intestine without perforation or abscess without bleeding: Secondary | ICD-10-CM

## 2017-08-11 DIAGNOSIS — M519 Unspecified thoracic, thoracolumbar and lumbosacral intervertebral disc disorder: Secondary | ICD-10-CM | POA: Diagnosis not present

## 2017-08-11 DIAGNOSIS — K219 Gastro-esophageal reflux disease without esophagitis: Secondary | ICD-10-CM

## 2017-08-11 DIAGNOSIS — D0512 Intraductal carcinoma in situ of left breast: Secondary | ICD-10-CM | POA: Diagnosis not present

## 2017-08-11 DIAGNOSIS — R945 Abnormal results of liver function studies: Secondary | ICD-10-CM

## 2017-08-11 LAB — HEPATIC FUNCTION PANEL
ALBUMIN: 3.9 g/dL (ref 3.5–5.2)
ALT: 15 U/L (ref 0–35)
AST: 21 U/L (ref 0–37)
Alkaline Phosphatase: 41 U/L (ref 39–117)
BILIRUBIN DIRECT: 0.1 mg/dL (ref 0.0–0.3)
TOTAL PROTEIN: 6.5 g/dL (ref 6.0–8.3)
Total Bilirubin: 0.5 mg/dL (ref 0.2–1.2)

## 2017-08-11 LAB — BASIC METABOLIC PANEL
BUN: 9 mg/dL (ref 6–23)
CALCIUM: 9.2 mg/dL (ref 8.4–10.5)
CHLORIDE: 101 meq/L (ref 96–112)
CO2: 29 meq/L (ref 19–32)
Creatinine, Ser: 0.69 mg/dL (ref 0.40–1.20)
GFR: 88.88 mL/min (ref >60.00)
Glucose, Bld: 111 mg/dL — ABNORMAL HIGH (ref 70–99)
POTASSIUM: 3.7 meq/L (ref 3.5–5.1)
SODIUM: 138 meq/L (ref 135–145)

## 2017-08-11 LAB — LIPID PANEL
CHOL/HDL RATIO: 2
Cholesterol: 191 mg/dL (ref 0–200)
HDL: 83.8 mg/dL (ref >39.00)
LDL Cholesterol: 94 mg/dL (ref 0–99)
NonHDL: 106.76
TRIGLYCERIDES: 66 mg/dL (ref 0.0–149.0)
VLDL: 13.2 mg/dL (ref 0.0–40.0)

## 2017-08-11 LAB — HEMOGLOBIN A1C: Hgb A1c MFr Bld: 5.7 % (ref 4.6–6.5)

## 2017-08-11 NOTE — Progress Notes (Signed)
Pre visit review using our clinic review tool, if applicable. No additional management support is needed unless otherwise documented below in the visit note. 

## 2017-08-11 NOTE — Progress Notes (Signed)
Patient ID: Helen Marshall, female   DOB: Feb 18, 1946, 72 y.o.   MRN: [id number]   Subjective:    Patient ID: Helen Marshall, female    DOB: [birth year], 72 y.o.   MRN: [id number]  HPI  Patient here for a scheduled follow up.  She reports she is doing relatively well.  Staying active.  No chest pain.  No sob.  No acid reflux.  Bowels stable. Recently saw rheumatology.  Diagnosed with DDD.  Went to physical therapy.  Discussed continuing exercises.  Overall stable.  Planned f/u in 6 months from last visit (04/2017).  Handling stress.       Past Medical History:  Diagnosis Date  . Abnormal LFTs   . Abnormal mammogram 12/19/2012   left  . Allergy   . Anemia   . Cancer (HCC)    Breast  . Carotid bruit    left  . Cataract   . Colon polyps   . Diverticulosis   . Diverticulosis 2012  . Environmental allergies   . GERD (gastroesophageal reflux disease)   . Heart murmur   . Hypercholesterolemia   . Hyperglycemia   . Hypertension   . IBS (irritable bowel syndrome)   . Lipoma of colon   . Lumbar disc disease   . 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  . Tubular adenoma of colon    Past Surgical History:  Procedure Laterality Date  . ABDOMINAL HYSTERECTOMY  1989   fibroids  . ANKLE SURGERY Left 1999  . BREAST SURGERY Left 1970   biopsy  . CATARACT EXTRACTION, BILATERAL  June & July 2017  . CHOLECYSTECTOMY  2001  . COLONOSCOPY  06/02/2011   Verdie Shire, MD; diverticulosis, submucosal lipoma of the sigmoid colon.  Marland Kitchen HERNIA REPAIR  2001, 2004  . HERNIA REPAIR  01/22/2013   Repeat repair of umbilical defect, 2.5 cm. Primary repair.  Marland Kitchen NASAL SINUS SURGERY  1997  . rotator cuff surgery  1998  . SQUAMOUS CELL CARCINOMA EXCISION Right 05/2013   shoulder  . TUBAL LIGATION  1976  . UPPER GI ENDOSCOPY  08/22/14   Dr Billie Lade   Family  History  Problem Relation Age of Onset  . CVA Father   . Alcohol abuse Father   . Heart disease Mother        myocardial infarction  . Diabetes Mother   . Hypertension Brother        x2  . Alcohol abuse Brother   . Arthritis/Rheumatoid Sister        x2  . Breast cancer Sister 3  . Osteoarthritis Sister   . Asthma Sister   . Hypothyroidism Sister   . Cancer Other        breast - paternal first cousin  . Colon cancer Maternal Uncle   . Colon polyps Son   . Colon polyps Sister    Social History   Socioeconomic History  . Marital status: Married    Spouse name: None  . Number of children: 2  . Years of education: None  . Highest education level: None  Social Needs  . Financial resource strain: None  . Food insecurity - worry: None  . Food insecurity - inability: None  . Transportation needs - medical: None  . Transportation needs - non-medical: None  Occupational History  . Occupation: Retired  Tobacco Use  . Smoking status: Never Smoker  . Smokeless tobacco: Never Used  Substance and Sexual Activity  . Alcohol use: No    Alcohol/week: 0.0 oz  . Drug use: No  . Sexual activity: No  Other Topics Concern  . None  Social History Narrative  . None    Outpatient Encounter Medications as of 08/11/2017  Medication Sig  . amLODipine (NORVASC) 5 MG tablet TAKE 1 TABLET BY MOUTH ONCE DAILY  . benazepril (LOTENSIN) 40 MG tablet Take 1 tablet (40 mg total) by mouth daily.  . calcium-vitamin D (CALCIUM 500+D) 500-400 MG-UNIT per tablet Take 1 tablet by mouth daily.   Marland Kitchen EPIPEN 2-PAK 0.3 MG/0.3ML SOAJ injection   . Fe Fum-FePoly-Vit C-Vit B3 (INTEGRA) 62.5-62.5-40-3 MG CAPS Take 1 capsule by mouth daily.  . fish oil-omega-3 fatty acids 1000 MG capsule 5 (five) times daily.   . fluticasone (FLONASE) 50 MCG/ACT nasal spray Place 2 sprays into both nostrils as needed.  . hydrochlorothiazide (HYDRODIURIL) 25 MG tablet TAKE 1 TABLET BY MOUTH ONCE DAILY  . Multiple Vitamin  (MULTIVITAMIN) tablet Take 1 tablet by mouth daily.  . niacin (NIASPAN) 1000 MG CR tablet Take 1,000 mg by mouth 2 (two) times daily.  . potassium chloride (K-DUR) 10 MEQ tablet TAKE 1 TABLET BY MOUTH TWICE DAILY  . PROAIR HFA 108 (90 Base) MCG/ACT inhaler Inhale 2 puffs into the lungs every 4 (four) hours as needed.  . Probiotic Product (VSL#3 PO) Take by mouth. 1 capsul daily  . ranitidine (ZANTAC) 150 MG tablet TAKE 1 TABLET BY MOUTH TWICE DAILY AS NEEDED FOR HEARTBURN.  . tamoxifen (NOLVADEX) 20 MG tablet Take 1 tablet (20 mg total) by mouth daily.  Marland Kitchen triamcinolone cream (KENALOG) 0.1 % Apply 1 application topically as needed.    No facility-administered encounter medications on file as of 08/11/2017.     Review of Systems  Constitutional: Negative for appetite change and unexpected weight change.  HENT: Negative for congestion and sinus pressure.   Respiratory: Negative for cough, chest tightness and shortness of breath.   Cardiovascular: Negative for chest pain, palpitations and leg swelling.  Gastrointestinal: Negative for abdominal pain, diarrhea, nausea and vomiting.  Genitourinary: Negative for difficulty urinating and dysuria.  Musculoskeletal: Negative for back pain and joint swelling.  Skin: Negative for color change and rash.  Neurological: Negative for dizziness, light-headedness and headaches.  Psychiatric/Behavioral: Negative for agitation and dysphoric mood.       Objective:     Blood pressure rechecked by me:  122/78  Physical Exam  Constitutional: She appears well-developed and well-nourished. No distress.  HENT:  Nose: Nose normal.  Mouth/Throat: Oropharynx is clear and moist.  Neck: Neck supple. No thyromegaly present.  Cardiovascular: Normal rate and regular rhythm.  Pulmonary/Chest: Breath sounds normal. No respiratory distress. She has no wheezes.  Abdominal: Soft. Bowel sounds are normal. There is no tenderness.  Musculoskeletal: She exhibits no edema  or tenderness.  Lymphadenopathy:    She has no cervical adenopathy.  Skin: No rash noted. No erythema.  Psychiatric: She has a normal mood and affect. Her behavior is normal.    BP 122/78   Pulse 77   Temp 98 F (36.7 C) (Oral)   Ht 5' 2.5" (1.588 m)   Wt 180 lb 9.6 oz (81.9 kg)   LMP 07/21/1988   SpO2 94%   BMI 32.51 kg/m  Wt Readings from Last 3 Encounters:  08/11/17 180 lb 9.6 oz (81.9 kg)  08/09/17  180 lb (81.6 kg)  07/19/17 180 lb (81.6 kg)     Lab Results  Component Value Date   WBC 5.0 11/15/2016   HGB 13.2 11/15/2016   HCT 39.0 11/15/2016   PLT 193.0 11/15/2016   GLUCOSE 111 (H) 08/11/2017   CHOL 191 08/11/2017   TRIG 66.0 08/11/2017   HDL 83.80 08/11/2017   LDLDIRECT 101.4 05/07/2013   LDLCALC 94 08/11/2017   ALT 15 08/11/2017   AST 21 08/11/2017   NA 138 08/11/2017   K 3.7 08/11/2017   CL 101 08/11/2017   CREATININE 0.69 08/11/2017   BUN 9 08/11/2017   CO2 29 08/11/2017   TSH 1.43 03/28/2017   HGBA1C 5.7 08/11/2017   MICROALBUR 0.5 01/16/2014    Dg Bone Density  Result Date: 12/01/2016 EXAM: DUAL X-RAY ABSORPTIOMETRY (DXA) FOR BONE MINERAL DENSITY IMPRESSION: Dear Dr Einar Pheasant, Your patient Helen Marshall completed a BMD test on 12/01/2016 using the [place] (analysis version: 14.10) manufactured by [employer]. The following summarizes the results of our evaluation. PATIENT BIOGRAPHICAL: Name: Helen Marshall, Helen Marshall Patient ID: [id number] Birth Date: Aug 13, 1945 Height: 63.0 in. Gender: Female Exam Date: 12/01/2016 Weight: 177.6 lbs. Indications: Advanced Age, arthritis, Caucasian, Family Hx of Osteoporosis, high risk meds, History of Fracture (Adult), hx breast ca, Hysterectomy, POSTmenopausal Fractures: right tib/fib Treatments: aspirin, Calcium, multivitamin, tamoxifen, Vitamin D ASSESSMENT: The BMD measured at Femur Neck Left is 0.851 g/cm2 with a T-score of -1.3. This patient is considered osteopenic according to [facility]) criteria.L2 &3 were excluded due to degenerative changes. Site Region Measured Measured WHO Young Adult BMD Date       Age      Classification T-score DualFemur Neck Left 12/01/2016 71.3 Osteopenia -1.3 0.851 g/cm2 AP Spine L1-L4 (L2,L3) 12/01/2016 71.3 Normal -0.6 1.096 g/cm2 World Health Organization [facility]) criteria for post-menopausal, Caucasian Women: Normal:       T-score at or above -1 SD Osteopenia:   T-score between -1 and -2.5 SD Osteoporosis: T-score at or below -2.5 SD RECOMMENDATIONS: [place] recommends that FDA-approved medical therapies be considered in postemenopausal women and men age 33 or older with a: 1. Hip or vertebral (clinical or morphometric) fracture. 2. T-score of < -2.5at the spine or hip. 3. Ten-year fracture probability by FRAX of 3% or greater for hip fracture or 20% or greater for major osteoporotic fracture. All treatment decisions require clinical judgment and consideration of individual patient factors, including patient preferences, co-morbidities, previous drug use, risk factors not captured in the FRAX model (e.g. falls, vitamin D deficiency, increased bone turnover, interval significant decline in bone density) and possible under - or over-estimation of fracture risk by FRAX. All patients should ensure an adequate intake of dietary calcium (1200 mg/d) and vitamin D (800 IU daily) unless contraindicated. FOLLOW-UP: People with diagnosed cases of osteoporosis or at high risk for fracture should have regular bone mineral density tests. For patients eligible for Medicare, routine testing is allowed once every 2 years. The testing frequency can be increased to one year for patients who have rapidly progressing disease, those who are receiving or discontinuing medical therapy to restore bone mass, or have additional risk factors. I have reviewed this report, and agree with the above findings. [facility]iology Dear Dr Einar Pheasant, Your  patient Helen Marshall completed a FRAX assessment on 12/01/2016 using the [place] (analysis version: 14.10) manufactured by [employer]. The following summarizes the results of our  evaluation. PATIENT BIOGRAPHICAL: Name: Helen Marshall, Helen Marshall Patient ID: [id number] Birth Date: 1945-10-20 Height:    63.0 in. Gender:     Female    [age]       Weight:    177.6 lbs. Ethnicity:  White                            Exam Date: 12/01/2016 FRAX* RESULTS:  (version: 3.5) 10-year Probability of Fracture1 Major Osteoporotic Fracture2 Hip Fracture 15.0% 2.0% Population: [place] (Caucasian) Risk Factors: History of Fracture (Adult) Based on Femur (Left) Neck BMD 1 -The 10-year probability of fracture may be lower than reported if the patient has received treatment. 2 -Major Osteoporotic Fracture: Clinical Spine, Forearm, Hip or Shoulder *FRAX is a Materials engineer of the [employer] for Metabolic Bone Disease, a [place] (WHO) [facility]. ASSESSMENT: The probability of a major osteoporotic fracture is 15.0% within the next ten years. The probability of a hip fracture is 2.0% within the next ten years. Electronically Signed   By: Rolm Baptise M.D.   On: 12/01/2016 10:46       Assessment & Plan:   Problem List Items Addressed This Visit    Abnormal liver function test    Diet and exercise.  Follow liver panel.        Relevant Orders   Hepatic function panel   Diverticulosis    Bowels stable.  Last colonoscopy 03/2016.        GERD (gastroesophageal reflux disease)    Currently doing well.  No upper symptoms reported.        Hypercholesterolemia    Has tried multiple statin medications.  Intolerant.  Have discussed other treatment options.  Low cholesterol diet and exercise.  Follow lipid panel.       Relevant Orders   Lipid panel   Hyperglycemia    Low carb diet and exercise.  Follow met b and a1c.       Relevant Orders    Hemoglobin A1c   Hypertension    Blood pressure under good control.  Continue same medication regimen.  Follow pressures.  Follow metabolic panel.        Relevant Orders   CBC with Differential/Platelet   Basic metabolic panel   Lumbar disc disease    DDD. Saw rheumatology.  PT.  Continue home exercises.        Neoplasm of left breast, primary tumor 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)    On tamoxifen.  Followed by Dr Bary Castilla.  Mammogram 08/23/16 - Birads II.  Continue f/u with Dr Bary Castilla.            Einar Pheasant, MD

## 2017-08-14 ENCOUNTER — Encounter: Payer: Self-pay | Admitting: Internal Medicine

## 2017-08-14 NOTE — Assessment & Plan Note (Signed)
Currently doing well.  No upper symptoms reported.

## 2017-08-14 NOTE — Assessment & Plan Note (Signed)
Low carb diet and exercise.  Follow met b and a1c.  

## 2017-08-14 NOTE — Assessment & Plan Note (Signed)
Blood pressure under good control.  Continue same medication regimen.  Follow pressures.  Follow metabolic panel.   

## 2017-08-14 NOTE — Assessment & Plan Note (Signed)
On tamoxifen.  Followed by Dr Bary Castilla.  Mammogram 08/23/16 - Birads II.  Continue f/u with Dr Bary Castilla.

## 2017-08-14 NOTE — Assessment & Plan Note (Signed)
Bowels stable.  Last colonoscopy 03/2016.

## 2017-08-14 NOTE — Assessment & Plan Note (Signed)
DDD. Saw rheumatology.  PT.  Continue home exercises.

## 2017-08-14 NOTE — Assessment & Plan Note (Signed)
Diet and exercise.  Follow liver panel.   

## 2017-08-14 NOTE — Assessment & Plan Note (Signed)
Has tried multiple statin medications.  Intolerant.  Have discussed other treatment options.  Low cholesterol diet and exercise.  Follow lipid panel.

## 2017-08-16 ENCOUNTER — Encounter: Payer: Self-pay | Admitting: General Surgery

## 2017-08-16 ENCOUNTER — Ambulatory Visit (INDEPENDENT_AMBULATORY_CARE_PROVIDER_SITE_OTHER): Payer: Medicare Other | Admitting: General Surgery

## 2017-08-16 VITALS — BP 132/70 | HR 69 | Resp 14 | Ht 63.0 in | Wt 173.0 lb

## 2017-08-16 DIAGNOSIS — D0512 Intraductal carcinoma in situ of left breast: Secondary | ICD-10-CM

## 2017-08-16 NOTE — Patient Instructions (Addendum)
The patient has been asked to return for her six months  with a left diagnostic mammogram.  Patient to call after mammogram Than six months bilateral diagnotic mammogram and follow up here.The patient is aware to call back for any questions or concerns.

## 2017-08-16 NOTE — Progress Notes (Signed)
Patient ID: Helen Marshall, female   DOB: Nov 28, 1945, 72 y.o.   MRN: [id number]  Chief Complaint  Patient presents with  . Follow-up    HPI Helen Marshall is a 72 y.o. female who presents for a breast evaluation. The most recent mammogram was done on 08/08/2017.  Patient does perform regular self breast checks and gets regular mammograms done.   Marland KitchenHPI  Past Medical History:  Diagnosis Date  . Abnormal LFTs   . Abnormal mammogram 12/19/2012   left  . Allergy   . Anemia   . Cancer (HCC)    Breast  . Carotid bruit    left  . Cataract   . Colon polyps   . Diverticulosis   . Diverticulosis 2012  . Environmental allergies   . GERD (gastroesophageal reflux disease)   . Heart murmur   . Hypercholesterolemia   . Hyperglycemia   . Hypertension   . IBS (irritable bowel syndrome)   . Lipoma of colon   . Lumbar disc disease   . 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  . Tubular adenoma of colon     Past Surgical History:  Procedure Laterality Date  . ABDOMINAL HYSTERECTOMY  1989   fibroids  . ANKLE SURGERY Left 1999  . BREAST SURGERY Left 1970   biopsy  . CATARACT EXTRACTION, BILATERAL  June & July 2017  . CHOLECYSTECTOMY  2001  . COLONOSCOPY  06/02/2011   Verdie Shire, MD; diverticulosis, submucosal lipoma of the sigmoid colon.  Marland Kitchen HERNIA REPAIR  2001, 2004  . HERNIA REPAIR  01/22/2013   Repeat repair of umbilical defect, 2.5 cm. Primary repair.  Marland Kitchen NASAL SINUS SURGERY  1997  . rotator cuff surgery  1998  . SQUAMOUS CELL CARCINOMA EXCISION Right 05/2013   shoulder  . TUBAL LIGATION  1976  . UPPER GI ENDOSCOPY  08/22/14   Dr Billie Lade    Family History  Problem Relation Age of Onset  . CVA Father   . Alcohol abuse Father   . Heart disease Mother        myocardial infarction  . Diabetes Mother   . Hypertension Brother      x2  . Alcohol abuse Brother   . Arthritis/Rheumatoid Sister        x2  . Breast cancer Sister 31  . Osteoarthritis Sister   . Asthma Sister   . Hypothyroidism Sister   . Cancer Other        breast - paternal first cousin  . Colon cancer Maternal Uncle   . Colon polyps Son   . Colon polyps Sister     Social History Social History   Tobacco Use  . Smoking status: Never Smoker  . Smokeless tobacco: Never Used  Substance Use Topics  . Alcohol use: No    Alcohol/week: 0.0 oz  . Drug use: No    Allergies  Allergen Reactions  . Crestor [Rosuvastatin] Other (See Comments)    Leg cramping   . Demerol [Meperidine] Nausea And Vomiting  . Latex Other (See Comments)    Redness, hands breakout  . Lipitor [Atorvastatin] Other (See Comments)    Intolerant   . Lovastatin Other (See Comments)    Elevated CK  . Pravastatin Other (See Comments)    intolerant  . Tramadol Nausea Only  . Vicodin [Hydrocodone-Acetaminophen] Other (  See Comments)    itching  . Zocor [Simvastatin] Other (See Comments)    Intolerant     Current Outpatient Medications  Medication Sig Dispense Refill  . amLODipine (NORVASC) 5 MG tablet TAKE 1 TABLET BY MOUTH ONCE DAILY 90 tablet 1  . benazepril (LOTENSIN) 40 MG tablet Take 1 tablet (40 mg total) by mouth daily. 90 tablet 1  . calcium-vitamin D (CALCIUM 500+D) 500-400 MG-UNIT per tablet Take 1 tablet by mouth daily.     Marland Kitchen EPIPEN 2-PAK 0.3 MG/0.3ML SOAJ injection     . Fe Fum-FePoly-Vit C-Vit B3 (INTEGRA) 62.5-62.5-40-3 MG CAPS Take 1 capsule by mouth daily. 30 each 2  . fish oil-omega-3 fatty acids 1000 MG capsule 5 (five) times daily.     . fluticasone (FLONASE) 50 MCG/ACT nasal spray Place 2 sprays into both nostrils as needed. 16 g 5  . hydrochlorothiazide (HYDRODIURIL) 25 MG tablet TAKE 1 TABLET BY MOUTH ONCE DAILY 90 tablet 1  . Multiple Vitamin (MULTIVITAMIN) tablet Take 1 tablet by mouth daily.    . niacin (NIASPAN) 1000 MG CR tablet Take 1,000  mg by mouth 2 (two) times daily.    . potassium chloride (K-DUR) 10 MEQ tablet TAKE 1 TABLET BY MOUTH TWICE DAILY 60 tablet 1  . PROAIR HFA 108 (90 Base) MCG/ACT inhaler Inhale 2 puffs into the lungs every 4 (four) hours as needed. 1 Inhaler 0  . Probiotic Product (VSL#3 PO) Take by mouth. 1 capsul daily    . ranitidine (ZANTAC) 150 MG tablet TAKE 1 TABLET BY MOUTH TWICE DAILY AS NEEDED FOR HEARTBURN. 60 tablet 3  . tamoxifen (NOLVADEX) 20 MG tablet Take 1 tablet (20 mg total) by mouth daily. 90 tablet 3  . triamcinolone cream (KENALOG) 0.1 % Apply 1 application topically as needed.      No current facility-administered medications for this visit.     Review of Systems Review of Systems  Constitutional: Negative.   Respiratory: Negative.   Cardiovascular: Negative.     Blood pressure 132/70, pulse 69, resp. rate 14, height 5\' 3"  (1.6 m), weight 173 lb (78.5 kg), last menstrual period 07/21/1988.  Physical Exam Physical Exam  Constitutional: She is oriented to person, place, and time. She appears well-developed and well-nourished.  Eyes: Conjunctivae are normal. No scleral icterus.  Neck: Neck supple.  Cardiovascular: Normal rate and regular rhythm.  Murmur heard.  Systolic murmur is present with a grade of 2/6. Pulmonary/Chest: Effort normal and breath sounds normal. Right breast exhibits no inverted nipple, no mass, no nipple discharge, no skin change and no tenderness. Left breast exhibits no inverted nipple, no mass, no nipple discharge, no skin change and no tenderness.    Abdominal: Normal appearance. There is no hepatomegaly. A hernia ( small umbilical hernia) is present.  Lymphadenopathy:    She has no cervical adenopathy.    She has no axillary adenopathy.  Neurological: She is alert and oriented to person, place, and time.  Skin: Skin is warm and dry.    Data Reviewed Bilateral diagnostic mammograms completed at [facility] on August 08, 2016 were reviewed.  Postsurgical  changes, better appreciated due to visualization of the lateral breast.  Six-month follow-up recommended.  BI-RADS-3. These films were reviewed.  Assessment    Healthy 72 year old woman status post wide excision for initial complex sclerosing lesion with identification of low-grade DCIS.  Negative sentinel node based on questionable microinvasion on original biopsy.    Plan    Patient continues to tolerated  tamoxifen well.  She is now 4.5 years into a planned 5-year therapy.  Will not mandate repeat office exam in 6 months, but she has been asked to call when she has her left mammogram in 6 months so I can review them.  If there are no interval changes we will plan for bilateral diagnostic mammograms in 1 year with office visit to follow.     The patient has been asked to have a left diagnostic mammogram in six months..  Patient to call after mammogram completed. Will not require office visit at that time unless a new mammographic finding is idetified.   Bilateral diagnotic mammogram and OV in one year. .The patient is aware to call back for any questions or concerns.    HPI, Physical Exam, Assessment and Plan have been scribed under the direction and in the presence of Hervey Ard, MD.  Gaspar Cola, CMA  I have completed the exam and reviewed the above documentation for accuracy and completeness.  I agree with the above.  Haematologist has been used and any errors in dictation or transcription are unintentional.  Hervey Ard, M.D., F.A.C.S.  Helen Marshall 08/16/2017, 9:36 AM

## 2017-08-18 ENCOUNTER — Telehealth: Payer: Self-pay | Admitting: *Deleted

## 2017-08-18 MED ORDER — AMLODIPINE BESYLATE 5 MG PO TABS: 5.0000 mg | ORAL_TABLET | Freq: Every day | ORAL | 1 refills | Status: DC

## 2017-08-18 MED ORDER — HYDROCHLOROTHIAZIDE 25 MG PO TABS: 25.0000 mg | ORAL_TABLET | Freq: Every day | ORAL | 1 refills | Status: DC

## 2017-08-18 MED ORDER — ALBUTEROL SULFATE HFA 108 (90 BASE) MCG/ACT IN AERS: 2.0000 | INHALATION_SPRAY | RESPIRATORY_TRACT | 2 refills | Status: DC | PRN

## 2017-08-18 NOTE — Telephone Encounter (Signed)
Okay to refill Potassium? Last written on: 11/09/16 for #60 with 1 refill.

## 2017-08-19 ENCOUNTER — Other Ambulatory Visit: Payer: Self-pay

## 2017-08-19 MED ORDER — POTASSIUM CHLORIDE ER 10 MEQ PO TBCR: 10.0000 meq | EXTENDED_RELEASE_TABLET | Freq: Every day | ORAL | 1 refills | Status: DC

## 2017-08-19 NOTE — Telephone Encounter (Signed)
Refill sent in to [facility] and patient aware.

## 2017-08-19 NOTE — Telephone Encounter (Signed)
Please call pt and confirm if she is still taking the potassium and if so, how often is she taking.  (med history reveals rx last written on 10/2016).

## 2017-08-19 NOTE — Telephone Encounter (Signed)
FYI

## 2017-08-19 NOTE — Telephone Encounter (Signed)
Ok to refill if taking regularly.  Please send in refill.

## 2017-08-19 NOTE — Telephone Encounter (Signed)
Left message for patient to clarify where she would like medication sent and how many times a day she is taking.

## 2017-08-19 NOTE — Telephone Encounter (Signed)
Patient states she is taking Potassium 10 mEq daily but has been out for a day or two waiting on refill. Told patient that our system shows that she last had it filled in April of 2018 and she stated that there is no way that could be right cause she is sure she takes it daily.

## 2017-08-19 NOTE — Telephone Encounter (Signed)
Pt calling back and states she takes it once a day and [facility] [address] In [place]

## 2017-08-29 ENCOUNTER — Other Ambulatory Visit: Payer: Self-pay

## 2017-08-29 MED ORDER — RANITIDINE HCL 150 MG PO TABS: ORAL_TABLET | ORAL | 1 refills | Status: DC

## 2017-08-31 ENCOUNTER — Other Ambulatory Visit: Payer: Self-pay | Admitting: *Deleted

## 2017-08-31 MED ORDER — POTASSIUM CHLORIDE ER 10 MEQ PO TBCR: 10.0000 meq | EXTENDED_RELEASE_TABLET | Freq: Every day | ORAL | 1 refills | Status: DC

## 2017-09-01 ENCOUNTER — Telehealth: Payer: Self-pay

## 2017-09-01 NOTE — Telephone Encounter (Signed)
Copied from [place] [id number]. Topic: General - Other >> Sep 01, 2017 10:47 AM Carolyn Stare wrote:   Margreta Journey with [facility] would like a call back concerning the below med   potassium chloride (K-DUR) 10 MEQ tablet  1 [phone]   ref [phone]

## 2017-09-02 NOTE — Telephone Encounter (Signed)
Clarified [facility] with [facility]. 10 mEq daily.

## 2017-09-02 NOTE — Telephone Encounter (Signed)
[facility] wanting clarification if pt should be on KlorCon or KDur. Also requesting to confirm decreased in dosage. Dr's line [phone]   Reference # [id number].

## 2017-11-10 ENCOUNTER — Telehealth: Payer: Self-pay | Admitting: Internal Medicine

## 2017-11-10 NOTE — Telephone Encounter (Signed)
Copied from [place] [id number]. Topic: Quick Communication - Rx Refill/Question >> Nov 10, 2017 11:46 AM Lennox Solders wrote: Medication: integra . Pt did not contact pharm. Pt needs [facility].

## 2017-11-10 NOTE — Telephone Encounter (Signed)
Integra   Needs a new RX for [employer] sent to [facility], [address]  LOV 08/11/17 with Dr. Nicki Reaper.

## 2017-11-11 ENCOUNTER — Other Ambulatory Visit: Payer: Self-pay | Admitting: Internal Medicine

## 2017-11-11 DIAGNOSIS — D649 Anemia, unspecified: Secondary | ICD-10-CM

## 2017-11-11 MED ORDER — INTEGRA 62.5-62.5-40-3 MG PO CAPS: 1.0000 | ORAL_CAPSULE | Freq: Every day | ORAL | 0 refills | Status: DC

## 2017-11-11 NOTE — Progress Notes (Signed)
Order placed for f/u lab.   

## 2017-11-11 NOTE — Telephone Encounter (Signed)
rx sent in for integra #90 with no refills.  Has lab appt checked 11/2017.

## 2017-11-11 NOTE — Progress Notes (Signed)
rx sent in for integra.  

## 2017-12-02 ENCOUNTER — Other Ambulatory Visit (INDEPENDENT_AMBULATORY_CARE_PROVIDER_SITE_OTHER): Payer: Medicare Other

## 2017-12-02 DIAGNOSIS — I10 Essential (primary) hypertension: Secondary | ICD-10-CM | POA: Diagnosis not present

## 2017-12-02 DIAGNOSIS — R739 Hyperglycemia, unspecified: Secondary | ICD-10-CM | POA: Diagnosis not present

## 2017-12-02 DIAGNOSIS — D649 Anemia, unspecified: Secondary | ICD-10-CM

## 2017-12-02 DIAGNOSIS — E7800 Pure hypercholesterolemia, unspecified: Secondary | ICD-10-CM

## 2017-12-02 DIAGNOSIS — R7989 Other specified abnormal findings of blood chemistry: Secondary | ICD-10-CM

## 2017-12-02 DIAGNOSIS — R945 Abnormal results of liver function studies: Secondary | ICD-10-CM

## 2017-12-02 LAB — LIPID PANEL
CHOLESTEROL: 188 mg/dL (ref 0–200)
HDL: 82.2 mg/dL (ref >39.00)
LDL CALC: 91 mg/dL (ref 0–99)
NonHDL: 105.44
Total CHOL/HDL Ratio: 2
Triglycerides: 71 mg/dL (ref 0.0–149.0)
VLDL: 14.2 mg/dL (ref 0.0–40.0)

## 2017-12-02 LAB — BASIC METABOLIC PANEL
BUN: 13 mg/dL (ref 6–23)
CALCIUM: 9 mg/dL (ref 8.4–10.5)
CO2: 28 mEq/L (ref 19–32)
Chloride: 104 mEq/L (ref 96–112)
Creatinine, Ser: 0.66 mg/dL (ref 0.40–1.20)
GFR: 93.47 mL/min (ref >60.00)
GLUCOSE: 103 mg/dL — AB (ref 70–99)
POTASSIUM: 3.9 meq/L (ref 3.5–5.1)
SODIUM: 141 meq/L (ref 135–145)

## 2017-12-02 LAB — CBC WITH DIFFERENTIAL/PLATELET
BASOS ABS: 0.1 10*3/uL (ref 0.0–0.1)
Basophils Relative: 1.3 % (ref 0.0–3.0)
EOS ABS: 0.2 10*3/uL (ref 0.0–0.7)
Eosinophils Relative: 4.4 % (ref 0.0–5.0)
HCT: 38.1 % (ref 36.0–46.0)
Hemoglobin: 13 g/dL (ref 12.0–15.0)
LYMPHS ABS: 1.6 10*3/uL (ref 0.7–4.0)
Lymphocytes Relative: 41.8 % (ref 12.0–46.0)
MCHC: 34 g/dL (ref 30.0–36.0)
MCV: 93.7 fl (ref 78.0–100.0)
MONO ABS: 0.6 10*3/uL (ref 0.1–1.0)
Monocytes Relative: 14.2 % — ABNORMAL HIGH (ref 3.0–12.0)
NEUTROS PCT: 38.3 % — AB (ref 43.0–77.0)
Neutro Abs: 1.5 10*3/uL (ref 1.4–7.7)
PLATELETS: 192 10*3/uL (ref 150.0–400.0)
RBC: 4.07 Mil/uL (ref 3.87–5.11)
RDW: 13.9 % (ref 11.5–15.5)
WBC: 3.9 10*3/uL — ABNORMAL LOW (ref 4.0–10.5)

## 2017-12-02 LAB — HEPATIC FUNCTION PANEL
ALBUMIN: 3.8 g/dL (ref 3.5–5.2)
ALT: 18 U/L (ref 0–35)
AST: 25 U/L (ref 0–37)
Alkaline Phosphatase: 46 U/L (ref 39–117)
Bilirubin, Direct: 0.1 mg/dL (ref 0.0–0.3)
TOTAL PROTEIN: 6 g/dL (ref 6.0–8.3)
Total Bilirubin: 0.4 mg/dL (ref 0.2–1.2)

## 2017-12-02 LAB — FERRITIN: Ferritin: 24.1 ng/mL (ref 10.0–291.0)

## 2017-12-02 LAB — HEMOGLOBIN A1C: HEMOGLOBIN A1C: 5.7 % (ref 4.6–6.5)

## 2017-12-05 ENCOUNTER — Telehealth: Payer: Self-pay

## 2017-12-05 NOTE — Telephone Encounter (Signed)
Patient notified of lab results and verbalized understanding she has an appt on 12-13-17 does she need to get labs prior to this appointment? Copied from [place]. Topic: General - Call Back - No Documentation >> Dec 05, 2017  3:05 PM Arletha Grippe wrote: Reason for CRM: pt is returning a call about labs - no crm -please call cell [phone] Pt is out of town

## 2017-12-07 ENCOUNTER — Other Ambulatory Visit: Payer: Self-pay | Admitting: Internal Medicine

## 2017-12-07 NOTE — Telephone Encounter (Signed)
Pt.notified

## 2017-12-07 NOTE — Telephone Encounter (Signed)
Reviewed phone message.  She was notified of lab results.  Lab result note states that "will schedule f/u lab at her appt".  Please notify pt that she does not needs labs prior to appt.

## 2017-12-07 NOTE — Telephone Encounter (Signed)
Patient is requesting to have labs done prior to her appt

## 2017-12-09 ENCOUNTER — Other Ambulatory Visit: Payer: Medicare Other

## 2017-12-13 ENCOUNTER — Ambulatory Visit (INDEPENDENT_AMBULATORY_CARE_PROVIDER_SITE_OTHER): Payer: Medicare Other | Admitting: Internal Medicine

## 2017-12-13 VITALS — BP 118/70 | HR 73 | Temp 98.1°F | Resp 18 | Ht 63.0 in | Wt 180.4 lb

## 2017-12-13 DIAGNOSIS — I10 Essential (primary) hypertension: Secondary | ICD-10-CM

## 2017-12-13 DIAGNOSIS — R945 Abnormal results of liver function studies: Secondary | ICD-10-CM | POA: Diagnosis not present

## 2017-12-13 DIAGNOSIS — R7989 Other specified abnormal findings of blood chemistry: Secondary | ICD-10-CM

## 2017-12-13 DIAGNOSIS — D0512 Intraductal carcinoma in situ of left breast: Secondary | ICD-10-CM

## 2017-12-13 DIAGNOSIS — Z0000 Encounter for general adult medical examination without abnormal findings: Secondary | ICD-10-CM

## 2017-12-13 DIAGNOSIS — E7800 Pure hypercholesterolemia, unspecified: Secondary | ICD-10-CM | POA: Diagnosis not present

## 2017-12-13 DIAGNOSIS — D72819 Decreased white blood cell count, unspecified: Secondary | ICD-10-CM

## 2017-12-13 DIAGNOSIS — R739 Hyperglycemia, unspecified: Secondary | ICD-10-CM | POA: Diagnosis not present

## 2017-12-13 NOTE — Assessment & Plan Note (Addendum)
Physical today 12/13/17.  Mammogram 08/2017.  Recommended f/u 6 month mammogram.  Followed by Dr Bary Castilla.  Colonoscopy 2017 - diverticulosis.

## 2017-12-13 NOTE — Progress Notes (Signed)
Patient ID: Helen Marshall, female   DOB: [birth year], 72 y.o.   MRN: [id number]

## 2017-12-13 NOTE — Progress Notes (Signed)
Patient ID: Helen Marshall, female   DOB: 14-Nov-1945, 72 y.o.   MRN: [id number]   Subjective:    Patient ID: Helen Marshall, female    DOB: July 24, 1946, 72 y.o.   MRN: [id number]  HPI  Patient here for her physical.  Sees Dr Bary Castilla for f/u of her breasts.  (low grade DCIS).  On tamoxifen.  Last mammogram 08/2017.  Recommended 6 month f/u mammogram.  States she plans to f/u with him regarding her mammogram.  She reports some fatigue.  Reports nothing out of the ordinary.  No chest pain.  Tries to stay active.  No sob.  No acid reflux.  No abdominal pain.  Bowels moving.  No urine change.  Discussed recent lab results.     Past Medical History:  Diagnosis Date  . Abnormal LFTs   . Abnormal mammogram 12/19/2012   left  . Allergy   . Anemia   . Cancer (HCC)    Breast  . Carotid bruit    left  . Cataract   . Colon polyps   . Diverticulosis   . Diverticulosis 2012  . Environmental allergies   . GERD (gastroesophageal reflux disease)   . Heart murmur   . Hypercholesterolemia   . Hyperglycemia   . Hypertension   . IBS (irritable bowel syndrome)   . Lipoma of colon   . Lumbar disc disease   . 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  . Tubular adenoma of colon    Past Surgical History:  Procedure Laterality Date  . ABDOMINAL HYSTERECTOMY  1989   fibroids  . ANKLE SURGERY Left 1999  . BREAST SURGERY Left 1970   biopsy  . CATARACT EXTRACTION, BILATERAL  June & July 2017  . CHOLECYSTECTOMY  2001  . COLONOSCOPY  06/02/2011   Verdie Shire, MD; diverticulosis, submucosal lipoma of the sigmoid colon.  Marland Kitchen HERNIA REPAIR  2001, 2004  . HERNIA REPAIR  01/22/2013   Repeat repair of umbilical defect, 2.5 cm. Primary repair.  Marland Kitchen NASAL SINUS SURGERY  1997  . rotator cuff surgery  1998  . SQUAMOUS CELL CARCINOMA EXCISION Right 05/2013   shoulder    . TUBAL LIGATION  1976  . UPPER GI ENDOSCOPY  08/22/14   Dr Billie Lade   Family History  Problem Relation Age of Onset  . CVA Father   . Alcohol abuse Father   . Heart disease Mother        myocardial infarction  . Diabetes Mother   . Hypertension Brother        x2  . Alcohol abuse Brother   . Arthritis/Rheumatoid Sister        x2  . Breast cancer Sister 34  . Osteoarthritis Sister   . Asthma Sister   . Hypothyroidism Sister   . Cancer Other        breast - paternal first cousin  . Colon cancer Maternal Uncle   . Colon polyps Son   . Colon polyps Sister    Social History   Socioeconomic History  . Marital status: Married    Spouse name: Not on file  . Number of children: 2  . Years of education: Not on file  . Highest education level: Not on file  Occupational History  . Occupation: Retired  Scientific laboratory technician  . Financial resource strain: Not on  file  . Food insecurity:    Worry: Not on file    Inability: Not on file  . Transportation needs:    Medical: Not on file    Non-medical: Not on file  Tobacco Use  . Smoking status: Never Smoker  . Smokeless tobacco: Never Used  Substance and Sexual Activity  . Alcohol use: No    Alcohol/week: 0.0 oz  . Drug use: No  . Sexual activity: Never  Lifestyle  . Physical activity:    Days per week: Not on file    Minutes per session: Not on file  . Stress: Not on file  Relationships  . Social connections:    Talks on phone: Not on file    Gets together: Not on file    Attends religious service: Not on file    Active member of club or organization: Not on file    Attends meetings of clubs or organizations: Not on file    Relationship status: Not on file  Other Topics Concern  . Not on file  Social History Narrative  . Not on file    Outpatient Encounter Medications as of 12/13/2017  Medication Sig  . albuterol (PROVENTIL HFA;VENTOLIN HFA) 108 (90 Base) MCG/ACT inhaler Inhale 2 puffs into the lungs every 4 (four)  hours as needed for wheezing or shortness of breath.  Marland Kitchen amLODipine (NORVASC) 5 MG tablet Take 1 tablet (5 mg total) by mouth daily.  . benazepril (LOTENSIN) 40 MG tablet TAKE 1 TABLET DAILY  . calcium-vitamin D (CALCIUM 500+D) 500-400 MG-UNIT per tablet Take 1 tablet by mouth daily.   Marland Kitchen EPIPEN 2-PAK 0.3 MG/0.3ML SOAJ injection   . Fe Fum-FePoly-Vit C-Vit B3 (INTEGRA) 62.5-62.5-40-3 MG CAPS Take 1 capsule by mouth daily.  . fish oil-omega-3 fatty acids 1000 MG capsule 5 (five) times daily.   . fluticasone (FLONASE) 50 MCG/ACT nasal spray Place 2 sprays into both nostrils as needed.  . hydrochlorothiazide (HYDRODIURIL) 25 MG tablet Take 1 tablet (25 mg total) by mouth daily.  . Multiple Vitamin (MULTIVITAMIN) tablet Take 1 tablet by mouth daily.  . niacin (NIASPAN) 1000 MG CR tablet Take 1,000 mg by mouth 2 (two) times daily.  . potassium chloride (K-DUR) 10 MEQ tablet Take 1 tablet (10 mEq total) by mouth daily.  . Probiotic Product (VSL#3 PO) Take by mouth. 1 capsul daily  . ranitidine (ZANTAC) 150 MG tablet TAKE 1 TABLET BY MOUTH TWICE DAILY AS NEEDED FOR HEARTBURN.  . tamoxifen (NOLVADEX) 20 MG tablet Take 1 tablet (20 mg total) by mouth daily.  Marland Kitchen triamcinolone cream (KENALOG) 0.1 % Apply 1 application topically as needed.    No facility-administered encounter medications on file as of 12/13/2017.     Review of Systems  Constitutional: Negative for appetite change and unexpected weight change.  HENT: Negative for congestion and sinus pressure.   Eyes: Negative for pain and visual disturbance.  Respiratory: Negative for cough, chest tightness and shortness of breath.   Cardiovascular: Negative for chest pain, palpitations and leg swelling.  Gastrointestinal: Negative for abdominal pain, diarrhea, nausea and vomiting.  Genitourinary: Negative for difficulty urinating and dysuria.  Musculoskeletal: Negative for joint swelling and myalgias.  Skin: Negative for color change and rash.   Neurological: Negative for dizziness, light-headedness and headaches.  Hematological: Negative for adenopathy. Does not bruise/bleed easily.  Psychiatric/Behavioral: Negative for agitation and dysphoric mood.       Objective:     Blood pressure rechecked by me:  130/72  Physical  Exam  Constitutional: She is oriented to person, place, and time. She appears well-developed and well-nourished. No distress.  HENT:  Nose: Nose normal.  Mouth/Throat: Oropharynx is clear and moist.  Eyes: Right eye exhibits no discharge. Left eye exhibits no discharge. No scleral icterus.  Neck: Neck supple. No thyromegaly present.  Cardiovascular: Normal rate and regular rhythm.  Pulmonary/Chest: Breath sounds normal. No accessory muscle usage. No tachypnea. No respiratory distress. She has no decreased breath sounds. She has no wheezes. She has no rhonchi. Right breast exhibits no inverted nipple, no mass, no nipple discharge and no tenderness (no axillary adenopathy). Left breast exhibits no inverted nipple, no mass, no nipple discharge and no tenderness (no axilarry adenopathy).  Abdominal: Soft. Bowel sounds are normal. There is no tenderness.  Musculoskeletal: She exhibits no edema or tenderness.  Lymphadenopathy:    She has no cervical adenopathy.  Neurological: She is alert and oriented to person, place, and time.  Skin: No rash noted. No erythema.  Psychiatric: She has a normal mood and affect. Her behavior is normal.    BP 118/70 (BP Location: Left Arm, Patient Position: Sitting, Cuff Size: Large)   Pulse 73   Temp 98.1 F (36.7 C) (Oral)   Resp 18   Ht '5\' 3"'  (1.6 m)   Wt 180 lb 6.4 oz (81.8 kg)   LMP 07/21/1988   SpO2 98%   BMI 31.96 kg/m  Wt Readings from Last 3 Encounters:  12/13/17 180 lb 6.4 oz (81.8 kg)  08/16/17 173 lb (78.5 kg)  08/11/17 180 lb 9.6 oz (81.9 kg)     Lab Results  Component Value Date   WBC 3.9 (L) 12/02/2017   HGB 13.0 12/02/2017   HCT 38.1 12/02/2017    PLT 192.0 12/02/2017   GLUCOSE 103 (H) 12/02/2017   CHOL 188 12/02/2017   TRIG 71.0 12/02/2017   HDL 82.20 12/02/2017   LDLDIRECT 101.4 05/07/2013   LDLCALC 91 12/02/2017   ALT 18 12/02/2017   AST 25 12/02/2017   NA 141 12/02/2017   K 3.9 12/02/2017   CL 104 12/02/2017   CREATININE 0.66 12/02/2017   BUN 13 12/02/2017   CO2 28 12/02/2017   TSH 1.43 03/28/2017   HGBA1C 5.7 12/02/2017   MICROALBUR 0.5 01/16/2014    Dg Bone Density  Result Date: 12/01/2016 EXAM: DUAL X-RAY ABSORPTIOMETRY (DXA) FOR BONE MINERAL DENSITY IMPRESSION: Dear Dr Einar Pheasant, Your patient Genese Quebedeaux completed a BMD test on 12/01/2016 using the [place] (analysis version: 14.10) manufactured by [employer]. The following summarizes the results of our evaluation. PATIENT BIOGRAPHICAL: Name: Cabela, Pacifico Patient ID: [id number] Birth Date: 09-24-1945 Height: 63.0 in. Gender: Female Exam Date: 12/01/2016 Weight: 177.6 lbs. Indications: Advanced Age, arthritis, Caucasian, Family Hx of Osteoporosis, high risk meds, History of Fracture (Adult), hx breast ca, Hysterectomy, POSTmenopausal Fractures: right tib/fib Treatments: aspirin, Calcium, multivitamin, tamoxifen, Vitamin D ASSESSMENT: The BMD measured at Femur Neck Left is 0.851 g/cm2 with a T-score of -1.3. This patient is considered osteopenic according to [facility]) criteria.L2 &3 were excluded due to degenerative changes. Site Region Measured Measured WHO Young Adult BMD Date       Age      Classification T-score DualFemur Neck Left 12/01/2016 71.3 Osteopenia -1.3 0.851 g/cm2 AP Spine L1-L4 (L2,L3) 12/01/2016 71.3 Normal -0.6 1.096 g/cm2 World Health Organization [facility]) criteria for post-menopausal, Caucasian Women: Normal:       T-score at or above -1 SD Osteopenia:  T-score between -1 and -2.5 SD Osteoporosis: T-score at or below -2.5 SD RECOMMENDATIONS: [place] recommends that FDA-approved  medical therapies be considered in postemenopausal women and men age 86 or older with a: 1. Hip or vertebral (clinical or morphometric) fracture. 2. T-score of < -2.5at the spine or hip. 3. Ten-year fracture probability by FRAX of 3% or greater for hip fracture or 20% or greater for major osteoporotic fracture. All treatment decisions require clinical judgment and consideration of individual patient factors, including patient preferences, co-morbidities, previous drug use, risk factors not captured in the FRAX model (e.g. falls, vitamin D deficiency, increased bone turnover, interval significant decline in bone density) and possible under - or over-estimation of fracture risk by FRAX. All patients should ensure an adequate intake of dietary calcium (1200 mg/d) and vitamin D (800 IU daily) unless contraindicated. FOLLOW-UP: People with diagnosed cases of osteoporosis or at high risk for fracture should have regular bone mineral density tests. For patients eligible for Medicare, routine testing is allowed once every 2 years. The testing frequency can be increased to one year for patients who have rapidly progressing disease, those who are receiving or discontinuing medical therapy to restore bone mass, or have additional risk factors. I have reviewed this report, and agree with the above findings. [facility]iology Dear Dr Einar Pheasant, Your patient JALAIYA OYSTER completed a FRAX assessment on 12/01/2016 using the [place] (analysis version: 14.10) manufactured by [employer]. The following summarizes the results of our evaluation. PATIENT BIOGRAPHICAL: Name: Mercy, Leppla Patient ID: [id number] Birth Date: 1945-10-05 Height:    63.0 in. Gender:     [age]    [age]       Weight:    177.6 lbs. Ethnicity:  White                            Exam Date: 12/01/2016 FRAX* RESULTS:  (version: 3.5) 10-year Probability of Fracture1 Major Osteoporotic Fracture2 Hip Fracture 15.0% 2.0%  Population: [place] (Caucasian) Risk Factors: History of Fracture (Adult) Based on Femur (Left) Neck BMD 1 -The 10-year probability of fracture may be lower than reported if the patient has received treatment. 2 -Major Osteoporotic Fracture: Clinical Spine, Forearm, Hip or Shoulder *FRAX is a Materials engineer of the [employer] for Metabolic Bone Disease, a [employer] (WHO) [facility]. ASSESSMENT: The probability of a major osteoporotic fracture is 15.0% within the next ten years. The probability of a hip fracture is 2.0% within the next ten years. Electronically Signed   By: Rolm Baptise M.D.   On: 12/01/2016 10:46       Assessment & Plan:   Problem List Items Addressed This Visit    Abnormal liver function test    Diet and exercise.  Follow liver panel.        Health care maintenance    Physical today 12/13/17.  Mammogram 08/2017.  Recommended f/u 6 month mammogram.  Followed by Dr Bary Castilla.  Colonoscopy 2017 - diverticulosis.        Hypercholesterolemia    Intolerant to multiple statins.  Have discussed other treatment options.  Follow lipid panel.  Low cholesterol diet and exercise.        Hyperglycemia    Low carb diet and exercise.  Follow met b and a1c.       Hypertension    Blood  pressure under good control.  Continue same medication regimen.  Follow pressures.  Follow metabolic panel.        Neoplasm of left breast, primary tumor 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)    On tamoxifen.  Followed by Dr Bary Castilla.  Due f/u mammogram in 12/2017.         Other Visit Diagnoses    Routine general medical examination at a health care facility    -  Primary   Leukopenia, unspecified type       Relevant Orders   CBC with Differential/Platelet       Einar Pheasant, MD

## 2017-12-20 ENCOUNTER — Encounter: Payer: Self-pay | Admitting: Internal Medicine

## 2017-12-20 NOTE — Assessment & Plan Note (Signed)
On tamoxifen.  Followed by Dr Bary Castilla.  Due f/u mammogram in 12/2017.

## 2017-12-20 NOTE — Assessment & Plan Note (Signed)
Diet and exercise.  Follow liver panel.   

## 2017-12-20 NOTE — Assessment & Plan Note (Signed)
Blood pressure under good control.  Continue same medication regimen.  Follow pressures.  Follow metabolic panel.   

## 2017-12-20 NOTE — Assessment & Plan Note (Signed)
Low carb diet and exercise.  Follow met b and a1c.  

## 2017-12-20 NOTE — Assessment & Plan Note (Signed)
Intolerant to multiple statins.  Have discussed other treatment options.  Follow lipid panel.  Low cholesterol diet and exercise.

## 2018-01-15 ENCOUNTER — Encounter: Payer: Self-pay | Admitting: Internal Medicine

## 2018-01-17 NOTE — Telephone Encounter (Signed)
Please call pt and have her postpone her lab draw scheduled for Thursday.  Reschedule to be done in the next 2-3 weeks.  The shingles vaccine she received should still be effective.

## 2018-01-17 NOTE — Telephone Encounter (Signed)
Left message to call back  

## 2018-01-17 NOTE — Telephone Encounter (Signed)
Pt scheduled for labs in two weeks

## 2018-01-17 NOTE — Telephone Encounter (Signed)
Pt returned call - she will be at the dentist and will be unavailable from 11 am to 1140 Please call [phone]

## 2018-01-19 ENCOUNTER — Other Ambulatory Visit: Payer: Medicare Other

## 2018-01-30 ENCOUNTER — Other Ambulatory Visit: Payer: Self-pay | Admitting: Internal Medicine

## 2018-02-07 ENCOUNTER — Other Ambulatory Visit (INDEPENDENT_AMBULATORY_CARE_PROVIDER_SITE_OTHER): Payer: Medicare Other

## 2018-02-07 DIAGNOSIS — D72819 Decreased white blood cell count, unspecified: Secondary | ICD-10-CM

## 2018-02-07 LAB — CBC WITH DIFFERENTIAL/PLATELET
BASOS PCT: 0.8 % (ref 0.0–3.0)
Basophils Absolute: 0 10*3/uL (ref 0.0–0.1)
EOS PCT: 2.8 % (ref 0.0–5.0)
Eosinophils Absolute: 0.1 10*3/uL (ref 0.0–0.7)
HEMATOCRIT: 35.8 % — AB (ref 36.0–46.0)
Hemoglobin: 12.2 g/dL (ref 12.0–15.0)
LYMPHS PCT: 34.2 % (ref 12.0–46.0)
Lymphs Abs: 1.8 10*3/uL (ref 0.7–4.0)
MCHC: 34 g/dL (ref 30.0–36.0)
MCV: 94.7 fl (ref 78.0–100.0)
MONOS PCT: 10.2 % (ref 3.0–12.0)
Monocytes Absolute: 0.5 10*3/uL (ref 0.1–1.0)
NEUTROS ABS: 2.7 10*3/uL (ref 1.4–7.7)
Neutrophils Relative %: 52 % (ref 43.0–77.0)
PLATELETS: 255 10*3/uL (ref 150.0–400.0)
RBC: 3.78 Mil/uL — ABNORMAL LOW (ref 3.87–5.11)
RDW: 14.8 % (ref 11.5–15.5)
WBC: 5.3 10*3/uL (ref 4.0–10.5)

## 2018-02-08 ENCOUNTER — Encounter: Payer: Self-pay | Admitting: Internal Medicine

## 2018-02-09 ENCOUNTER — Telehealth: Payer: Self-pay

## 2018-02-09 NOTE — Telephone Encounter (Signed)
I called patient & let her know that wbc was within normal limits. She could let us know if she needed anything. Patient had no further questions.

## 2018-02-22 ENCOUNTER — Ambulatory Visit: Payer: Self-pay | Admitting: *Deleted

## 2018-02-22 NOTE — Telephone Encounter (Signed)
Patient on week 6 of shingles, would like to know if there is anything she can take for nerve pain.      Patient states she is having a lot of nerve pain with the residual healing area. She had the shingles on R hip and she has already finished her treatment. She is having nerve pain and would like help with that. Call to office- they agree patient will need to be seen- appointment made for patient in morning.  Reason for Disposition . [1] Request for URGENT new prescription or refill of "essential" medication (i.e., likelihood of harm to patient if not taken) AND [2] triager unable to fill per unit policy  Answer Assessment - Initial Assessment Questions 1. SYMPTOMS: "Do you have any symptoms?"     Pain in area of shingles infection 2. SEVERITY: If symptoms are present, ask "Are they mild, moderate or severe?"     Moderate/severe- hip area so moving and wearing clothing can be irritating to site  Protocols used: MEDICATION QUESTION CALL-A-AH

## 2018-02-22 NOTE — Progress Notes (Signed)
Subjective:    Helen Marshall ID: Helen Marshall, female    DOB: Jun 15, 1946, 72 y.o.   MRN: [id number]  HPI  Helen Marshall is a 72 year old female who presents today with pain related to shingles that have been present for 6 weeks. Helen Marshall was evaluated and treated for shingles in [facility] and was prescribed valacylovir. Helen Marshall was provided muprocin 2% and oxycodone acetaminophen 5-325 mg x 3 days for discomfort.   Appearance of rash at onset: approximately January 05, 2018 Prior history of this rash: No Initial distribution: Right hip Discomfort associated. Pain described as burning and tingling. This can also be sore at times Associated symptoms: Occasional pruritus Change in rash: Moved to inner thighs Denies: fever, chills sweats, myalgias, difficulty swallowing, hoarseness, SOB, N/V, abdominal pain, or tightening of throat.  No new exposures of soaps, lotions, laundry detergent, fabric softeners, foods, medications, plants, animals, or insects.   Previous treatment: Valacylovir; Helen Marshall completed course and no new vesicles have appeared however pain is present and not improving. Sick contact/travel: No recent sick contact exposure.  Pertinent Past Medical History- HTN, GERD   Review of Systems  Constitutional: Negative for chills, fatigue and fever.  Respiratory: Negative for cough, shortness of breath and wheezing.   Cardiovascular: Negative for chest pain and palpitations.  Gastrointestinal: Negative for abdominal pain, diarrhea, nausea and vomiting.  Genitourinary: Negative for dysuria, frequency, hematuria and urgency.  Musculoskeletal: Negative for myalgias.  Skin: Positive for rash.  Neurological: Negative for dizziness, weakness, light-headedness and headaches.   Past Medical History:  Diagnosis Date  . Abnormal LFTs   . Abnormal mammogram 12/19/2012   left  . Allergy   . Anemia   . Cancer (HCC)    Breast  . Carotid bruit    left  . Cataract   . Colon polyps   . Diverticulosis    . Diverticulosis 2012  . Environmental allergies   . GERD (gastroesophageal reflux disease)   . Heart murmur   . Hypercholesterolemia   . Hyperglycemia   . Hypertension   . IBS (irritable bowel syndrome)   . Lipoma of colon   . Lumbar disc disease   . 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  . Tubular adenoma of colon      Social History   Socioeconomic History  . Marital status: Married    Spouse name: Not on file  . Number of children: 2  . Years of education: Not on file  . Highest education level: Not on file  Occupational History  . Occupation: Retired  Scientific laboratory technician  . Financial resource strain: Not on file  . Food insecurity:    Worry: Not on file    Inability: Not on file  . Transportation needs:    Medical: Not on file    Non-medical: Not on file  Tobacco Use  . Smoking status: Never Smoker  . Smokeless tobacco: Never Used  Substance and Sexual Activity  . Alcohol use: No    Alcohol/week: 0.0 oz  . Drug use: No  . Sexual activity: Never  Lifestyle  . Physical activity:    Days per week: Not on file    Minutes per session: Not on file  . Stress: Not on file  Relationships  . Social connections:    Talks on phone: Not on file  Gets together: Not on file    Attends religious service: Not on file    Active member of club or organization: Not on file    Attends meetings of clubs or organizations: Not on file    Relationship status: Not on file  . Intimate partner violence:    Fear of current or ex partner: Not on file    Emotionally abused: Not on file    Physically abused: Not on file    Forced sexual activity: Not on file  Other Topics Concern  . Not on file  Social History Narrative  . Not on file    Past Surgical History:  Procedure Laterality Date  . ABDOMINAL HYSTERECTOMY  1989    fibroids  . ANKLE SURGERY Left 1999  . BREAST SURGERY Left 1970   biopsy  . CATARACT EXTRACTION, BILATERAL  June & July 2017  . CHOLECYSTECTOMY  2001  . COLONOSCOPY  06/02/2011   Verdie Shire, MD; diverticulosis, submucosal lipoma of the sigmoid colon.  Marland Kitchen HERNIA REPAIR  2001, 2004  . HERNIA REPAIR  01/22/2013   Repeat repair of umbilical defect, 2.5 cm. Primary repair.  Marland Kitchen NASAL SINUS SURGERY  1997  . rotator cuff surgery  1998  . SQUAMOUS CELL CARCINOMA EXCISION Right 05/2013   shoulder  . TUBAL LIGATION  1976  . UPPER GI ENDOSCOPY  08/22/14   Dr Billie Lade    Family History  Problem Relation Age of Onset  . CVA Father   . Alcohol abuse Father   . Heart disease Mother        myocardial infarction  . Diabetes Mother   . Hypertension Brother        x2  . Alcohol abuse Brother   . Arthritis/Rheumatoid Sister        x2  . Breast cancer Sister 72  . Osteoarthritis Sister   . Asthma Sister   . Hypothyroidism Sister   . Cancer Other        breast - paternal first cousin  . Colon cancer Maternal Uncle   . Colon polyps Son   . Colon polyps Sister     Allergies  Allergen Reactions  . Crestor [Rosuvastatin] Other (See Comments)    Leg cramping   . Demerol [Meperidine] Nausea And Vomiting  . Latex Other (See Comments)    Redness, hands breakout  . Lipitor [Atorvastatin] Other (See Comments)    Intolerant   . Lovastatin Other (See Comments)    Elevated CK  . Pravastatin Other (See Comments)    intolerant  . Tramadol Nausea Only  . Vicodin [Hydrocodone-Acetaminophen] Other (See Comments)    itching  . Zocor [Simvastatin] Other (See Comments)    Intolerant     Current Outpatient Medications on File Prior to Visit  Medication Sig Dispense Refill  . albuterol (PROVENTIL HFA;VENTOLIN HFA) 108 (90 Base) MCG/ACT inhaler Inhale 2 puffs into the lungs every 4 (four) hours as needed for wheezing or shortness of breath. 1 Inhaler 2  . amLODipine (NORVASC) 5 MG tablet TAKE 1  TABLET DAILY 90 tablet 1  . benazepril (LOTENSIN) 40 MG tablet TAKE 1 TABLET DAILY 90 tablet 1  . calcium-vitamin D (CALCIUM 500+D) 500-400 MG-UNIT per tablet Take 1 tablet by mouth daily.     Marland Kitchen EPIPEN 2-PAK 0.3 MG/0.3ML SOAJ injection     . Fe Fum-FePoly-Vit C-Vit B3 (INTEGRA) 62.5-62.5-40-3 MG CAPS Take 1 capsule by mouth daily. 90 each 0  . fish oil-omega-3 fatty acids 1000  MG capsule 5 (five) times daily.     . fluticasone (FLONASE) 50 MCG/ACT nasal spray Place 2 sprays into both nostrils as needed. 16 g 5  . hydrochlorothiazide (HYDRODIURIL) 25 MG tablet TAKE 1 TABLET DAILY 90 tablet 1  . Multiple Vitamin (MULTIVITAMIN) tablet Take 1 tablet by mouth daily.    . niacin (NIASPAN) 1000 MG CR tablet Take 1,000 mg by mouth 2 (two) times daily.    . potassium chloride (K-DUR) 10 MEQ tablet Take 1 tablet (10 mEq total) by mouth daily. 90 tablet 1  . Probiotic Product (VSL#3 PO) Take by mouth. 1 capsul daily    . ranitidine (ZANTAC) 150 MG tablet TAKE 1 TABLET BY MOUTH TWICE DAILY AS NEEDED FOR HEARTBURN. 180 tablet 1  . tamoxifen (NOLVADEX) 20 MG tablet Take 1 tablet (20 mg total) by mouth daily. 90 tablet 3  . triamcinolone cream (KENALOG) 0.1 % Apply 1 application topically as needed.      No current facility-administered medications on file prior to visit.     BP 134/78 (BP Location: Left Arm, Helen Marshall Position: Sitting, Cuff Size: Large)   Pulse 81   Temp 98.3 F (36.8 C) (Oral)   Wt 180 lb 4 oz (81.8 kg)   LMP 07/21/1988   SpO2 96%   BMI 31.93 kg/m       Objective:   Physical Exam  Constitutional: Helen Marshall is oriented to person, place, and time. Helen Marshall appears well-developed and well-nourished.  Eyes: Pupils are equal, round, and reactive to light. No scleral icterus.  Neck: Neck supple.  Cardiovascular: Normal rate, regular rhythm and intact distal pulses.  Pulmonary/Chest: Effort normal and breath sounds normal. Helen Marshall has no wheezes. Helen Marshall has no rales.  Abdominal: Soft. Bowel sounds  are normal. There is no tenderness.  Musculoskeletal: Helen Marshall exhibits no edema.  Lymphadenopathy:    Helen Marshall has no cervical adenopathy.  Neurological: Helen Marshall is alert and oriented to person, place, and time.  Skin: Skin is warm and dry. Capillary refill takes less than 2 seconds. No erythema.  Resolving herpes zoster lesions on right buttock and upper left inner thigh. No new vesicles noted.   Psychiatric: Helen Marshall has a normal mood and affect. Helen Marshall behavior is normal. Judgment and thought content normal.        Assessment & Plan:  1. Post herpetic neuralgia Herpes zoster is resolving; no new lesions noted today; pain has remained. We discussed treatment options including topical such as lidocaine 3% TID and Helen Marshall is not interested in topical at this time. Will trial gabapentin 300 mg once today, then move to 300 mg BID for discomfort. Advised Helen Marshall to let us know how Helen Marshall is is doing and dose can be titrated is needed. Reviewed precautions related to gabapentin including drowsiness and Helen Marshall will avoid activities such as driving when taking this medication. Helen Marshall will also taper down from two tablets to one tablet if pain is improving. Advised close follow up with PCP and we discussed that it is challenging to determine how long discomfort will last. Helen Marshall will let us know how Helen Marshall is doing and further evaluation and treatment plan will be determined at that point.  - gabapentin (NEURONTIN) 300 MG capsule; Take 300 mg (1 tab) today, then you can take 300 mg (2 tabs) twice daily as needed  Dispense: 60 capsule; Refill: 0  Delano Metz, FNP-C

## 2018-02-22 NOTE — Telephone Encounter (Signed)
Patient has been scheduled to see Gwynneth Munson for 02-23-18.

## 2018-02-23 ENCOUNTER — Encounter: Payer: Self-pay | Admitting: Family Medicine

## 2018-02-23 ENCOUNTER — Ambulatory Visit: Payer: Medicare Other | Admitting: Family Medicine

## 2018-02-23 VITALS — BP 134/78 | HR 81 | Temp 98.3°F | Wt 180.2 lb

## 2018-02-23 DIAGNOSIS — B0229 Other postherpetic nervous system involvement: Secondary | ICD-10-CM

## 2018-02-23 MED ORDER — GABAPENTIN 300 MG PO CAPS: ORAL_CAPSULE | ORAL | 0 refills | Status: DC

## 2018-02-23 NOTE — Patient Instructions (Signed)
Please take onet tablet today, then you can take one tablet twice daily starting tomorrow if needed. You can try the over the counter cream such as aspercreme that has lidocaine also.  Monitor for symptom relief and follow up with Dr. Nicki Reaper if no improvement.  Postherpetic Neuralgia Postherpetic neuralgia (PHN) is nerve pain that occurs after a shingles infection. Shingles is a painful rash that appears on one side of the body, usually on your trunk or face. Shingles is caused by the varicella-zoster virus. This is the same virus that causes chickenpox. In people who have had chickenpox, the virus can resurface years later and cause shingles. You may have PHN if you continue to have pain for 3 months after your shingles rash has gone away. PHN appears in the same area where you had the shingles rash. For most people, PHN goes away within 1 year. Getting a vaccination for shingles can prevent PHN. This vaccine is recommended for people older than 50. It may prevent shingles and may also lower your risk of PHN if you do get shingles. What are the causes? PHN is caused by damage to your nerves from the varicella-zoster virus. This damage makes your nerves overly sensitive. What increases the risk? Aging is the biggest risk factor for developing PHN. Most people who get PHN are older than 36. Other risk factors include:  Having very bad pain before your shingles rash starts.  Having a very bad rash.  Having shingles in the nerve that supplies your face and eye (trigeminal nerve).  What are the signs or symptoms? Pain is the main symptom of PHN. The pain is often very bad and may be described as stabbing, burning, or feeling like an electric shock. The pain may come and go or may be there all the time. Pain may be triggered by light touches on the skin or changes in temperature. You may have itching along with the pain. How is this diagnosed? Your health care provider may diagnose PHN based on your  symptoms and your history of shingles. Lab studies and other diagnostic tests are usually not needed. How is this treated? There is no cure for PHN. Treatment for PHN will focus on pain relief. Over-the-counter pain relievers do not usually relieve PHN pain. You may need to work with a pain specialist. Treatment may include:  Antidepressant medicines to help with pain and improve sleep.  Antiseizure medicines to relieve nerve pain.  Strong pain relievers (opioids).  A numbing patch worn on the skin (lidocaine patch).  Follow these instructions at home: It may take a long time to recover from PHN. Work closely with your health care provider, and have a good support system at home.  Take all medicines as directed by your health care provider.  Wear loose, comfortable clothing.  Cover sensitive areas with a dressing to reduce friction from clothing rubbing on the area.  If cold does not make your pain worse, try applying a cool compress or cooling gel pack to the area.  Talk to your health care provider if you feel depressed or desperate. Living with long-term pain can be depressing.  Contact a health care provider if:  Your medicine is not helping.  You are struggling to manage your pain at home. This information is not intended to replace advice given to you by your health care provider. Make sure you discuss any questions you have with your health care provider. Document Released: 10/09/2002 Document Revised: 12/25/2015 Document Reviewed: 07/10/2013 Elsevier  Interactive Patient Education  [employer].

## 2018-03-06 ENCOUNTER — Encounter: Payer: Self-pay | Admitting: General Surgery

## 2018-03-06 ENCOUNTER — Other Ambulatory Visit: Payer: Self-pay | Admitting: Internal Medicine

## 2018-03-17 ENCOUNTER — Other Ambulatory Visit: Payer: Self-pay | Admitting: Family Medicine

## 2018-03-17 DIAGNOSIS — B0229 Other postherpetic nervous system involvement: Secondary | ICD-10-CM

## 2018-03-27 ENCOUNTER — Telehealth: Payer: Self-pay | Admitting: *Deleted

## 2018-03-27 DIAGNOSIS — B0229 Other postherpetic nervous system involvement: Secondary | ICD-10-CM

## 2018-03-27 NOTE — Telephone Encounter (Signed)
Copied from [place] [id number]. Topic: General - Other >> Mar 27, 2018 11:13 AM Carolyn Stare wrote:  Pt said Helen Marshall RX her the below med and is asking for a refill said she has shingles on her low hip and her under wear keeping it irritated and is asking for a refill   gabapentin (NEURONTIN) 300 MG capsule

## 2018-03-27 NOTE — Telephone Encounter (Signed)
I am ok to refill gabapentin, but need to clarify how she is taking.  Gabapentin 300mg  q day or bid?

## 2018-03-27 NOTE — Telephone Encounter (Signed)
Ok to refill Gabapentin given by NP Piedad Climes on 02/23/18 for  Shingles , patient still having irritation at panty line on left hip.

## 2018-03-28 NOTE — Telephone Encounter (Signed)
Patient staed she is taking the gabapentin 300 mg twice daily.

## 2018-03-28 NOTE — Telephone Encounter (Signed)
Ok to send in rx for gabapentin bid. #60 with one refill.  I was going to send in rx, but I did not know which pharmacy.  (has [place] and [facility] listed.

## 2018-03-29 MED ORDER — GABAPENTIN 300 MG PO CAPS: ORAL_CAPSULE | ORAL | 1 refills | Status: DC

## 2018-03-29 NOTE — Telephone Encounter (Signed)
Script sent to [facility], patient notified.

## 2018-04-06 NOTE — Telephone Encounter (Signed)
Patient stated that her pharmacy contacted the office for a reason to why she needs the gabapentin. They told her today they have not heard back from Dr Nicki Reaper. Patient would like to move forward with this so she can get her refill. Please advise.

## 2018-04-06 NOTE — Telephone Encounter (Signed)
Patient states that a prior authorization is needed.

## 2018-04-10 NOTE — Telephone Encounter (Signed)
PA completed. Approved. Patient is aware

## 2018-04-17 ENCOUNTER — Telehealth: Payer: Self-pay | Admitting: *Deleted

## 2018-04-17 ENCOUNTER — Other Ambulatory Visit: Payer: Self-pay | Admitting: Internal Medicine

## 2018-04-17 DIAGNOSIS — E7800 Pure hypercholesterolemia, unspecified: Secondary | ICD-10-CM

## 2018-04-17 DIAGNOSIS — R739 Hyperglycemia, unspecified: Secondary | ICD-10-CM

## 2018-04-17 DIAGNOSIS — R7989 Other specified abnormal findings of blood chemistry: Secondary | ICD-10-CM

## 2018-04-17 DIAGNOSIS — I10 Essential (primary) hypertension: Secondary | ICD-10-CM

## 2018-04-17 DIAGNOSIS — R945 Abnormal results of liver function studies: Secondary | ICD-10-CM

## 2018-04-17 NOTE — Telephone Encounter (Signed)
Orders placed for f/u labs.  

## 2018-04-17 NOTE — Telephone Encounter (Signed)
Pt has lab appt on Wed 04/19/18. Please place future orders

## 2018-04-17 NOTE — Progress Notes (Signed)
Orders placed for f/u labs.  

## 2018-04-19 ENCOUNTER — Other Ambulatory Visit (INDEPENDENT_AMBULATORY_CARE_PROVIDER_SITE_OTHER): Payer: Medicare Other

## 2018-04-19 DIAGNOSIS — R739 Hyperglycemia, unspecified: Secondary | ICD-10-CM | POA: Diagnosis not present

## 2018-04-19 DIAGNOSIS — R945 Abnormal results of liver function studies: Secondary | ICD-10-CM | POA: Diagnosis not present

## 2018-04-19 DIAGNOSIS — E7800 Pure hypercholesterolemia, unspecified: Secondary | ICD-10-CM

## 2018-04-19 DIAGNOSIS — I10 Essential (primary) hypertension: Secondary | ICD-10-CM

## 2018-04-19 DIAGNOSIS — R7989 Other specified abnormal findings of blood chemistry: Secondary | ICD-10-CM

## 2018-04-19 LAB — BASIC METABOLIC PANEL
BUN: 9 mg/dL (ref 6–23)
CO2: 29 mEq/L (ref 19–32)
CREATININE: 0.65 mg/dL (ref 0.40–1.20)
Calcium: 9.2 mg/dL (ref 8.4–10.5)
Chloride: 104 mEq/L (ref 96–112)
GFR: 95.04 mL/min (ref >60.00)
Glucose, Bld: 108 mg/dL — ABNORMAL HIGH (ref 70–99)
POTASSIUM: 3.8 meq/L (ref 3.5–5.1)
Sodium: 138 mEq/L (ref 135–145)

## 2018-04-19 LAB — MICROALBUMIN / CREATININE URINE RATIO
Creatinine,U: 49.8 mg/dL
Microalb Creat Ratio: 1.4 mg/g (ref 0.0–30.0)
Microalb, Ur: <0.7 mg/dL (ref 0.0–1.9)

## 2018-04-19 LAB — HEPATIC FUNCTION PANEL
ALK PHOS: 44 U/L (ref 39–117)
ALT: 16 U/L (ref 0–35)
AST: 24 U/L (ref 0–37)
Albumin: 3.9 g/dL (ref 3.5–5.2)
BILIRUBIN TOTAL: 0.4 mg/dL (ref 0.2–1.2)
Bilirubin, Direct: 0.1 mg/dL (ref 0.0–0.3)
Total Protein: 6.3 g/dL (ref 6.0–8.3)

## 2018-04-19 LAB — LIPID PANEL
Cholesterol: 195 mg/dL (ref 0–200)
HDL: 75.6 mg/dL (ref >39.00)
LDL Cholesterol: 101 mg/dL — ABNORMAL HIGH (ref 0–99)
NONHDL: 119.58
Total CHOL/HDL Ratio: 3
Triglycerides: 91 mg/dL (ref 0.0–149.0)
VLDL: 18.2 mg/dL (ref 0.0–40.0)

## 2018-04-19 LAB — TSH: TSH: 1.54 u[IU]/mL (ref 0.35–4.50)

## 2018-04-19 LAB — HEMOGLOBIN A1C: Hgb A1c MFr Bld: 5.9 % (ref 4.6–6.5)

## 2018-04-21 ENCOUNTER — Encounter: Payer: Self-pay | Admitting: Internal Medicine

## 2018-04-21 ENCOUNTER — Ambulatory Visit: Payer: Medicare Other | Admitting: Internal Medicine

## 2018-04-21 DIAGNOSIS — D0512 Intraductal carcinoma in situ of left breast: Secondary | ICD-10-CM

## 2018-04-21 DIAGNOSIS — Z9109 Other allergy status, other than to drugs and biological substances: Secondary | ICD-10-CM

## 2018-04-21 DIAGNOSIS — D649 Anemia, unspecified: Secondary | ICD-10-CM

## 2018-04-21 DIAGNOSIS — K219 Gastro-esophageal reflux disease without esophagitis: Secondary | ICD-10-CM

## 2018-04-21 DIAGNOSIS — R7989 Other specified abnormal findings of blood chemistry: Secondary | ICD-10-CM

## 2018-04-21 DIAGNOSIS — E7800 Pure hypercholesterolemia, unspecified: Secondary | ICD-10-CM

## 2018-04-21 DIAGNOSIS — R945 Abnormal results of liver function studies: Secondary | ICD-10-CM | POA: Diagnosis not present

## 2018-04-21 DIAGNOSIS — I10 Essential (primary) hypertension: Secondary | ICD-10-CM

## 2018-04-21 DIAGNOSIS — R739 Hyperglycemia, unspecified: Secondary | ICD-10-CM

## 2018-04-21 MED ORDER — BENAZEPRIL HCL 40 MG PO TABS: 40.0000 mg | ORAL_TABLET | Freq: Every day | ORAL | 1 refills | Status: DC

## 2018-04-21 MED ORDER — AMLODIPINE BESYLATE 5 MG PO TABS: 5.0000 mg | ORAL_TABLET | Freq: Every day | ORAL | 1 refills | Status: DC

## 2018-04-21 MED ORDER — HYDROCHLOROTHIAZIDE 25 MG PO TABS: 25.0000 mg | ORAL_TABLET | Freq: Every day | ORAL | 1 refills | Status: DC

## 2018-04-21 MED ORDER — POLYSACCHARIDE IRON COMPLEX 150 MG PO CAPS: 150.0000 mg | ORAL_CAPSULE | Freq: Every day | ORAL | 1 refills | Status: DC

## 2018-04-21 NOTE — Patient Instructions (Signed)
Can take pepcid 20mg  per day.

## 2018-04-21 NOTE — Progress Notes (Signed)
Patient ID: Helen Marshall, female   DOB: 22-Jul-1946, 72 y.o.   MRN: [id number]   Subjective:    Patient ID: Helen Marshall, female    DOB: 1945-12-20, 72 y.o.   MRN: [id number]  HPI  Patient here for a scheduled follow up.  She reports she is doing relatively well.  Tries to stay active.  Was having some right lower back and right side discomfort.  Started one to two weeks ago.  No injury.  No significant pain now.  Desires no further w/up.  She reports that she has noticed some increased burping.  Off zantac.  Stopped because of warnings she heard on TV.  Has noticed some increase in symptoms since stopping.  No chest pain.  No sob.  No abdominal pain.  Bowels stable.  Noticed some ear discomfort.  Sees Dr Tami Ribas.     Past Medical History:  Diagnosis Date  . Abnormal LFTs   . Abnormal mammogram 12/19/2012   left  . Allergy   . Anemia   . Cancer (HCC)    Breast  . Carotid bruit    left  . Cataract   . Colon polyps   . Diverticulosis   . Diverticulosis 2012  . Environmental allergies   . GERD (gastroesophageal reflux disease)   . Heart murmur   . Hypercholesterolemia   . Hyperglycemia   . Hypertension   . IBS (irritable bowel syndrome)   . Lipoma of colon   . Lumbar disc disease   . 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  . Tubular adenoma of colon    Past Surgical History:  Procedure Laterality Date  . ABDOMINAL HYSTERECTOMY  1989   fibroids  . ANKLE SURGERY Left 1999  . BREAST SURGERY Left 1970   biopsy  . CATARACT EXTRACTION, BILATERAL  June & July 2017  . CHOLECYSTECTOMY  2001  . COLONOSCOPY  06/02/2011   Verdie Shire, MD; diverticulosis, submucosal lipoma of the sigmoid colon.  Marland Kitchen HERNIA REPAIR  2001, 2004  . HERNIA REPAIR  01/22/2013   Repeat repair of umbilical defect, 2.5 cm. Primary repair.  Marland Kitchen NASAL SINUS  SURGERY  1997  . rotator cuff surgery  1998  . SQUAMOUS CELL CARCINOMA EXCISION Right 05/2013   shoulder  . TUBAL LIGATION  1976  . UPPER GI ENDOSCOPY  08/22/14   Dr Billie Lade   Family History  Problem Relation Age of Onset  . CVA Father   . Alcohol abuse Father   . Heart disease Mother        myocardial infarction  . Diabetes Mother   . Hypertension Brother        x2  . Alcohol abuse Brother   . Arthritis/Rheumatoid Sister        x2  . Breast cancer Sister 37  . Osteoarthritis Sister   . Asthma Sister   . Hypothyroidism Sister   . Cancer Other        breast - paternal first cousin  . Colon cancer Maternal Uncle   . Colon polyps Son   . Colon polyps Sister    Social History   Socioeconomic History  . Marital status: Married    Spouse name: Not on file  . Number of children: 2  . Years of education: Not on file  . Highest education level: Not on  file  Occupational History  . Occupation: Retired  Scientific laboratory technician  . Financial resource strain: Not on file  . Food insecurity:    Worry: Not on file    Inability: Not on file  . Transportation needs:    Medical: Not on file    Non-medical: Not on file  Tobacco Use  . Smoking status: Never Smoker  . Smokeless tobacco: Never Used  Substance and Sexual Activity  . Alcohol use: No    Alcohol/week: 0.0 standard drinks  . Drug use: No  . Sexual activity: Never  Lifestyle  . Physical activity:    Days per week: Not on file    Minutes per session: Not on file  . Stress: Not on file  Relationships  . Social connections:    Talks on phone: Not on file    Gets together: Not on file    Attends religious service: Not on file    Active member of club or organization: Not on file    Attends meetings of clubs or organizations: Not on file    Relationship status: Not on file  Other Topics Concern  . Not on file  Social History Narrative  . Not on file    Outpatient Encounter Medications as of 04/21/2018  Medication Sig    . amLODipine (NORVASC) 5 MG tablet Take 1 tablet (5 mg total) by mouth daily.  . benazepril (LOTENSIN) 40 MG tablet Take 1 tablet (40 mg total) by mouth daily.  . calcium-vitamin D (CALCIUM 500+D) 500-400 MG-UNIT per tablet Take 1 tablet by mouth daily.   Marland Kitchen EPIPEN 2-PAK 0.3 MG/0.3ML SOAJ injection   . fish oil-omega-3 fatty acids 1000 MG capsule 5 (five) times daily.   . fluticasone (FLONASE) 50 MCG/ACT nasal spray Place 2 sprays into both nostrils as needed.  . gabapentin (NEURONTIN) 300 MG capsule Take 300 mg (1 tab) today, then you can take 300 mg (2 tabs) twice daily as needed  . hydrochlorothiazide (HYDRODIURIL) 25 MG tablet Take 1 tablet (25 mg total) by mouth daily.  . Multiple Vitamin (MULTIVITAMIN) tablet Take 1 tablet by mouth daily.  . niacin (NIASPAN) 1000 MG CR tablet Take 1,000 mg by mouth 2 (two) times daily.  . potassium chloride (K-DUR,KLOR-CON) 10 MEQ tablet TAKE 1 TABLET DAILY  . Probiotic Product (VSL#3 PO) Take by mouth. 1 capsul daily  . ranitidine (ZANTAC) 150 MG tablet TAKE 1 TABLET BY MOUTH TWICE DAILY AS NEEDED FOR HEARTBURN.  . tamoxifen (NOLVADEX) 20 MG tablet Take 1 tablet (20 mg total) by mouth daily.  Marland Kitchen triamcinolone cream (KENALOG) 0.1 % Apply 1 application topically as needed.   . VENTOLIN HFA 108 (90 Base) MCG/ACT inhaler USE 2 INHALATIONS EVERY 4 HOURS AS NEEDED FOR WHEEZING OR SHORTNESS OF BREATH  . [DISCONTINUED] amLODipine (NORVASC) 5 MG tablet TAKE 1 TABLET DAILY  . [DISCONTINUED] benazepril (LOTENSIN) 40 MG tablet TAKE 1 TABLET DAILY  . [DISCONTINUED] Fe Fum-FePoly-Vit C-Vit B3 (INTEGRA) 62.5-62.5-40-3 MG CAPS Take 1 capsule by mouth daily.  . [DISCONTINUED] hydrochlorothiazide (HYDRODIURIL) 25 MG tablet TAKE 1 TABLET DAILY  . [DISCONTINUED] iron polysaccharides (NU-IRON) 150 MG capsule Take 1 capsule (150 mg total) by mouth daily.   No facility-administered encounter medications on file as of 04/21/2018.     Review of Systems  Constitutional:  Negative for appetite change and unexpected weight change.  HENT: Negative for congestion and sinus pressure.   Respiratory: Negative for cough, chest tightness and shortness of breath.   Cardiovascular: Negative  for chest pain, palpitations and leg swelling.  Gastrointestinal: Negative for abdominal pain, diarrhea, nausea and vomiting.       Increased burping as outlined.  Off zantac.    Genitourinary: Negative for difficulty urinating and dysuria.  Musculoskeletal: Negative for joint swelling and myalgias.  Skin: Negative for color change and rash.  Neurological: Negative for dizziness, light-headedness and headaches.  Psychiatric/Behavioral: Negative for agitation and dysphoric mood.       Objective:    Physical Exam  Constitutional: She appears well-developed and well-nourished. No distress.  HENT:  Nose: Nose normal.  Mouth/Throat: Oropharynx is clear and moist.  Neck: Neck supple. No thyromegaly present.  Cardiovascular: Normal rate and regular rhythm.  Pulmonary/Chest: Breath sounds normal. No respiratory distress. She has no wheezes.  Abdominal: Soft. Bowel sounds are normal. There is no tenderness.  Musculoskeletal: She exhibits no edema or tenderness.  Lymphadenopathy:    She has no cervical adenopathy.  Skin: No rash noted. No erythema.  Psychiatric: She has a normal mood and affect. Her behavior is normal.    BP 118/72 (BP Location: Left Arm, Patient Position: Sitting, Cuff Size: Normal)   Pulse 90   Temp 97.7 F (36.5 C) (Oral)   Resp 18   Wt 180 lb 12.8 oz (82 kg)   LMP 07/21/1988   SpO2 96%   BMI 32.03 kg/m  Wt Readings from Last 3 Encounters:  04/21/18 180 lb 12.8 oz (82 kg)  02/23/18 180 lb 4 oz (81.8 kg)  12/13/17 180 lb 6.4 oz (81.8 kg)     Lab Results  Component Value Date   WBC 5.3 02/07/2018   HGB 12.2 02/07/2018   HCT 35.8 (L) 02/07/2018   PLT 255.0 02/07/2018   GLUCOSE 108 (H) 04/19/2018   CHOL 195 04/19/2018   TRIG 91.0 04/19/2018    HDL 75.60 04/19/2018   LDLDIRECT 101.4 05/07/2013   LDLCALC 101 (H) 04/19/2018   ALT 16 04/19/2018   AST 24 04/19/2018   NA 138 04/19/2018   K 3.8 04/19/2018   CL 104 04/19/2018   CREATININE 0.65 04/19/2018   BUN 9 04/19/2018   CO2 29 04/19/2018   TSH 1.54 04/19/2018   HGBA1C 5.9 04/19/2018   MICROALBUR <0.7 04/19/2018    Dg Bone Density  Result Date: 12/01/2016 EXAM: DUAL X-RAY ABSORPTIOMETRY (DXA) FOR BONE MINERAL DENSITY IMPRESSION: Dear Dr Einar Pheasant, Your patient Barbaraann Avans completed a BMD test on 12/01/2016 using the [place] (analysis version: 14.10) manufactured by [employer]. The following summarizes the results of our evaluation. PATIENT BIOGRAPHICAL: Name: Efrat, Zuidema Patient ID: [id number] Birth Date: February 28, 1946 Height: 63.0 in. Gender: Female Exam Date: 12/01/2016 Weight: 177.6 lbs. Indications: Advanced Age, arthritis, Caucasian, Family Hx of Osteoporosis, high risk meds, History of Fracture (Adult), hx breast ca, Hysterectomy, POSTmenopausal Fractures: right tib/fib Treatments: aspirin, Calcium, multivitamin, tamoxifen, Vitamin D ASSESSMENT: The BMD measured at Femur Neck Left is 0.851 g/cm2 with a T-score of -1.3. This patient is considered osteopenic according to [facility]) criteria.L2 &3 were excluded due to degenerative changes. Site Region Measured Measured WHO Young Adult BMD Date       Age      Classification T-score DualFemur Neck Left 12/01/2016 71.3 Osteopenia -1.3 0.851 g/cm2 AP Spine L1-L4 (L2,L3) 12/01/2016 71.3 Normal -0.6 1.096 g/cm2 World Health Organization [facility]) criteria for post-menopausal, Caucasian Women: Normal:       T-score at or above -1 SD Osteopenia:   T-score between -1 and -  2.5 SD Osteoporosis: T-score at or below -2.5 SD RECOMMENDATIONS: [place] recommends that FDA-approved medical therapies be considered in postemenopausal women and men age 46 or older with a: 1. Hip or  vertebral (clinical or morphometric) fracture. 2. T-score of < -2.5at the spine or hip. 3. Ten-year fracture probability by FRAX of 3% or greater for hip fracture or 20% or greater for major osteoporotic fracture. All treatment decisions require clinical judgment and consideration of individual patient factors, including patient preferences, co-morbidities, previous drug use, risk factors not captured in the FRAX model (e.g. falls, vitamin D deficiency, increased bone turnover, interval significant decline in bone density) and possible under - or over-estimation of fracture risk by FRAX. All patients should ensure an adequate intake of dietary calcium (1200 mg/d) and vitamin D (800 IU daily) unless contraindicated. FOLLOW-UP: People with diagnosed cases of osteoporosis or at high risk for fracture should have regular bone mineral density tests. For patients eligible for Medicare, routine testing is allowed once every 2 years. The testing frequency can be increased to one year for patients who have rapidly progressing disease, those who are receiving or discontinuing medical therapy to restore bone mass, or have additional risk factors. I have reviewed this report, and agree with the above findings. [facility] Radiology Dear Dr Einar Pheasant, Your patient OLYVIA GOPAL completed a FRAX assessment on 12/01/2016 using the [place] (analysis version: 14.10) manufactured by [employer]. The following summarizes the results of our evaluation. PATIENT BIOGRAPHICAL: Name: Layana, Konkel Patient ID: [id number] Birth Date: 1946/02/03 Height:    63.0 in. Gender:     Female    [age]       Weight:    177.6 lbs. Ethnicity:  White                            Exam Date: 12/01/2016 FRAX* RESULTS:  (version: 3.5) 10-year Probability of Fracture1 Major Osteoporotic Fracture2 Hip Fracture 15.0% 2.0% Population: [place] (Caucasian) Risk Factors: History of Fracture (Adult) Based on Femur (Left) Neck BMD  1 -The 10-year probability of fracture may be lower than reported if the patient has received treatment. 2 -Major Osteoporotic Fracture: Clinical Spine, Forearm, Hip or Shoulder *FRAX is a Materials engineer of the [employer] for Metabolic Bone Disease, a [place] (WHO) [facility]. ASSESSMENT: The probability of a major osteoporotic fracture is 15.0% within the next ten years. The probability of a hip fracture is 2.0% within the next ten years. Electronically Signed   By: Rolm Baptise M.D.   On: 12/01/2016 10:46       Assessment & Plan:   Problem List Items Addressed This Visit    Abnormal liver function test    Diet and exercise.  Follow liver panel.        Anemia    Follow cbc and ferritin.        Environmental allergies    Followed by Dr Tami Ribas.        GERD (gastroesophageal reflux disease)    Off zantac.  Increased burping.  Start pepcid.  Follow.  Notify me if persistent symptoms.        Hypercholesterolemia    Intolerant to multiple statin medications.  Have discussed other treatment options.  She wants to hold on additional medication.  Follow lipid panel.        Relevant  Medications   amLODipine (NORVASC) 5 MG tablet   benazepril (LOTENSIN) 40 MG tablet   hydrochlorothiazide (HYDRODIURIL) 25 MG tablet   Hyperglycemia    Low carb diet and exercise.  Follow met b and a1c.        Hypertension    Blood pressure under good control.  Continue same medication regimen.  Follow pressures.  Follow metabolic panel.        Relevant Medications   amLODipine (NORVASC) 5 MG tablet   benazepril (LOTENSIN) 40 MG tablet   hydrochlorothiazide (HYDRODIURIL) 25 MG tablet   Neoplasm of left breast, primary tumor 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)    On tamoxifen.  Followed by Dr Bary Castilla.  Had mammogram (left) 03/2018.  Due f/u bilateral diagnostic mammogram - 6 months.            Einar Pheasant, MD

## 2018-04-26 ENCOUNTER — Telehealth: Payer: Self-pay | Admitting: Internal Medicine

## 2018-04-26 MED ORDER — POLYSACCHARIDE IRON COMPLEX 150 MG PO CAPS: 150.0000 mg | ORAL_CAPSULE | Freq: Every day | ORAL | 1 refills | Status: DC

## 2018-04-26 NOTE — Telephone Encounter (Signed)
Copied from [place] [id number]. Topic: Quick Communication - Rx Refill/Question >> Apr 26, 2018 11:07 AM Alfredia Ferguson R wrote: Medication: iron polysaccharides (NU-IRON) 150 MG capsule  Has the patient contacted their pharmacy? Yes, [facility] will not fill.  Preferred Pharmacy (with phone number or street name): [id number] - [place], [place] [phone] (Phone) [phone] (Fax)    Agent: Please be advised that RX refills may take up to 3 business days. We ask that you follow-up with your pharmacy.

## 2018-04-30 ENCOUNTER — Encounter: Payer: Self-pay | Admitting: Internal Medicine

## 2018-04-30 NOTE — Assessment & Plan Note (Signed)
Intolerant to multiple statin medications.  Have discussed other treatment options.  She wants to hold on additional medication.  Follow lipid panel.

## 2018-04-30 NOTE — Assessment & Plan Note (Signed)
Off zantac.  Increased burping.  Start pepcid.  Follow.  Notify me if persistent symptoms.

## 2018-04-30 NOTE — Assessment & Plan Note (Signed)
On tamoxifen.  Followed by Dr Bary Castilla.  Had mammogram (left) 03/2018.  Due f/u bilateral diagnostic mammogram - 6 months.

## 2018-04-30 NOTE — Assessment & Plan Note (Signed)
Low carb diet and exercise.  Follow met b and a1c.   

## 2018-04-30 NOTE — Assessment & Plan Note (Signed)
Blood pressure under good control.  Continue same medication regimen.  Follow pressures.  Follow metabolic panel.   

## 2018-04-30 NOTE — Assessment & Plan Note (Signed)
Diet and exercise.  Follow liver panel.   

## 2018-04-30 NOTE — Assessment & Plan Note (Signed)
Follow cbc and ferritin.  

## 2018-04-30 NOTE — Assessment & Plan Note (Signed)
Followed by Dr Tami Ribas.

## 2018-05-06 ENCOUNTER — Other Ambulatory Visit: Payer: Self-pay | Admitting: Internal Medicine

## 2018-05-06 DIAGNOSIS — B0229 Other postherpetic nervous system involvement: Secondary | ICD-10-CM

## 2018-06-13 ENCOUNTER — Telehealth: Payer: Self-pay | Admitting: General Surgery

## 2018-06-13 NOTE — Telephone Encounter (Signed)
She would like for the Breast Cancer Index to be done, she doesn't mind taking the medication if it will benefit her. [facility] order, pathology and forms faxed

## 2018-06-13 NOTE — Telephone Encounter (Signed)
Patient is calling said she saw Dr. Bary Castilla over five years ago and was told to take a hormone pill for five years and patient just finished her last one, patient said she doesn't want to take it if she doesn't have too, and she comes back in January to see Dr. Bary Castilla. Please call patient and advise.

## 2018-06-27 ENCOUNTER — Telehealth: Payer: Self-pay | Admitting: General Surgery

## 2018-06-27 NOTE — Telephone Encounter (Signed)
Patient is calling back in reference to know if she  should continue to take the tamoxifen. Patient still has two refills and would like to know if she should continue taking this until her follow up with Dr Bary Castilla in January (in recall box for 08/16/2018) . She states that she called about this previously. Patient states she was advised that she would be getting a phone call from someone that would be seeing if insurance would cover. She has not received a phone call at this time from anyone in reference to this. Please call patient at [phone] with information that can be provided.   The Breast Cancer Index Test Requisition Form is scanned in the chart and sent on 06/13/18.

## 2018-06-27 NOTE — Telephone Encounter (Signed)
We have not relieved results, aware it takes several weeks. She will get 30 day refill on her medication, pt agrees.

## 2018-07-14 ENCOUNTER — Ambulatory Visit: Payer: Medicare Other | Admitting: Internal Medicine

## 2018-07-31 ENCOUNTER — Ambulatory Visit: Payer: Medicare Other | Admitting: Internal Medicine

## 2018-07-31 ENCOUNTER — Encounter: Payer: Self-pay | Admitting: Internal Medicine

## 2018-07-31 VITALS — BP 126/58 | HR 89 | Temp 97.9°F | Ht 63.0 in | Wt 180.6 lb

## 2018-07-31 DIAGNOSIS — K219 Gastro-esophageal reflux disease without esophagitis: Secondary | ICD-10-CM | POA: Diagnosis not present

## 2018-07-31 DIAGNOSIS — R945 Abnormal results of liver function studies: Secondary | ICD-10-CM

## 2018-07-31 DIAGNOSIS — J329 Chronic sinusitis, unspecified: Secondary | ICD-10-CM | POA: Diagnosis not present

## 2018-07-31 DIAGNOSIS — I10 Essential (primary) hypertension: Secondary | ICD-10-CM

## 2018-07-31 DIAGNOSIS — R739 Hyperglycemia, unspecified: Secondary | ICD-10-CM

## 2018-07-31 DIAGNOSIS — D649 Anemia, unspecified: Secondary | ICD-10-CM

## 2018-07-31 DIAGNOSIS — E7800 Pure hypercholesterolemia, unspecified: Secondary | ICD-10-CM

## 2018-07-31 DIAGNOSIS — R7989 Other specified abnormal findings of blood chemistry: Secondary | ICD-10-CM

## 2018-07-31 DIAGNOSIS — D0512 Intraductal carcinoma in situ of left breast: Secondary | ICD-10-CM

## 2018-07-31 MED ORDER — POTASSIUM CHLORIDE CRYS ER 10 MEQ PO TBCR: 10.0000 meq | EXTENDED_RELEASE_TABLET | Freq: Every day | ORAL | 1 refills | Status: DC

## 2018-07-31 MED ORDER — ALBUTEROL SULFATE HFA 108 (90 BASE) MCG/ACT IN AERS: 2.0000 | INHALATION_SPRAY | Freq: Four times a day (QID) | RESPIRATORY_TRACT | 1 refills | Status: DC | PRN

## 2018-07-31 MED ORDER — AMLODIPINE BESYLATE 5 MG PO TABS: 5.0000 mg | ORAL_TABLET | Freq: Every day | ORAL | 1 refills | Status: DC

## 2018-07-31 MED ORDER — HYDROCHLOROTHIAZIDE 25 MG PO TABS: 25.0000 mg | ORAL_TABLET | Freq: Every day | ORAL | 1 refills | Status: DC

## 2018-07-31 NOTE — Progress Notes (Signed)
Pre visit review using our clinic review tool, if applicable. No additional management support is needed unless otherwise documented below in the visit note. 

## 2018-07-31 NOTE — Progress Notes (Signed)
Patient ID: Helen Marshall, female   DOB: November 14, 1945, 72 y.o.   MRN: [id number]   Subjective:    Patient ID: Helen Marshall, female    DOB: [birth year], 72 y.o.   MRN: [id number]  HPI  Patient here for a scheduled follow up.  Recent sinus infection.  Saw Dr Helen Marshall several days ago.  Treated with prednisone and abx.  Better now.  Will complete course of medications.  Breathing stable.  No cough or chest congestion.  Has to change to pro air secondary to insurance.  No chest pain. No acid reflux.  No abdominal pain.  Bowels moving.  Off tamoxifen.  Handling stress.     Past Medical History:  Diagnosis Date  . Abnormal LFTs   . Abnormal mammogram 12/19/2012   left  . Allergy   . Anemia   . Cancer (HCC)    Breast  . Carotid bruit    left  . Cataract   . Colon polyps   . Diverticulosis   . Diverticulosis 2012  . Environmental allergies   . GERD (gastroesophageal reflux disease)   . Heart murmur   . Hypercholesterolemia   . Hyperglycemia   . Hypertension   . IBS (irritable bowel syndrome)   . Lipoma of colon   . Lumbar disc disease   . 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  . Tubular adenoma of colon    Past Surgical History:  Procedure Laterality Date  . ABDOMINAL HYSTERECTOMY  1989   fibroids  . ANKLE SURGERY Left 1999  . BREAST SURGERY Left 1970   biopsy  . CATARACT EXTRACTION, BILATERAL  June & July 2017  . CHOLECYSTECTOMY  2001  . COLONOSCOPY  06/02/2011   Helen Shire, MD; diverticulosis, submucosal lipoma of the sigmoid colon.  Marland Kitchen HERNIA REPAIR  2001, 2004  . HERNIA REPAIR  01/22/2013   Repeat repair of umbilical defect, 2.5 cm. Primary repair.  Marland Kitchen NASAL SINUS SURGERY  1997  . rotator cuff surgery  1998  . SQUAMOUS CELL CARCINOMA EXCISION Right 05/2013   shoulder  . TUBAL LIGATION  1976  . UPPER GI ENDOSCOPY   08/22/14   Dr Helen Marshall   Family History  Problem Relation Age of Onset  . CVA Father   . Alcohol abuse Father   . Heart disease Mother        myocardial infarction  . Diabetes Mother   . Hypertension Brother        x2  . Alcohol abuse Brother   . Arthritis/Rheumatoid Sister        x2  . Breast cancer Sister 26  . Osteoarthritis Sister   . Asthma Sister   . Hypothyroidism Sister   . Cancer Other        breast - paternal first cousin  . Colon cancer Maternal Uncle   . Colon polyps Son   . Colon polyps Sister    Social History   Socioeconomic History  . Marital status: Married    Spouse name: Not on file  . Number of children: 2  . Years of education: Not on file  . Highest education level: Not on file  Occupational History  . Occupation: Retired  Scientific laboratory technician  . Financial resource strain: Not on file  . Food insecurity:    Worry: Not on file  Inability: Not on file  . Transportation needs:    Medical: Not on file    Non-medical: Not on file  Tobacco Use  . Smoking status: Never Smoker  . Smokeless tobacco: Never Used  Substance and Sexual Activity  . Alcohol use: No    Alcohol/week: 0.0 standard drinks  . Drug use: No  . Sexual activity: Never  Lifestyle  . Physical activity:    Days per week: Not on file    Minutes per session: Not on file  . Stress: Not on file  Relationships  . Social connections:    Talks on phone: Not on file    Gets together: Not on file    Attends religious service: Not on file    Active member of club or organization: Not on file    Attends meetings of clubs or organizations: Not on file    Relationship status: Not on file  Other Topics Concern  . Not on file  Social History Narrative  . Not on file    Outpatient Encounter Medications as of 07/31/2018  Medication Sig  . amLODipine (NORVASC) 5 MG tablet Take 1 tablet (5 mg total) by mouth daily.  . benazepril (LOTENSIN) 40 MG tablet Take 1 tablet (40 mg total) by mouth  daily.  . calcium-vitamin D (CALCIUM 500+D) 500-400 MG-UNIT per tablet Take 1 tablet by mouth daily.   . fish oil-omega-3 fatty acids 1000 MG capsule 5 (five) times daily.   . fluticasone (FLONASE) 50 MCG/ACT nasal spray Place 2 sprays into both nostrils as needed.  . hydrochlorothiazide (HYDRODIURIL) 25 MG tablet Take 1 tablet (25 mg total) by mouth daily.  . iron polysaccharides (NU-IRON) 150 MG capsule Take 1 capsule (150 mg total) by mouth daily.  . Multiple Vitamin (MULTIVITAMIN) tablet Take 1 tablet by mouth daily.  . niacin (NIASPAN) 1000 MG CR tablet Take 1,000 mg by mouth 2 (two) times daily.  . potassium chloride (K-DUR,KLOR-CON) 10 MEQ tablet Take 1 tablet (10 mEq total) by mouth daily.  . Probiotic Product (VSL#3 PO) Take by mouth. 1 capsul daily  . ranitidine (ZANTAC) 150 MG tablet TAKE 1 TABLET BY MOUTH TWICE DAILY AS NEEDED FOR HEARTBURN.  . tamoxifen (NOLVADEX) 20 MG tablet Take 1 tablet (20 mg total) by mouth daily.  Marland Kitchen triamcinolone cream (KENALOG) 0.1 % Apply 1 application topically as needed.   . [DISCONTINUED] amLODipine (NORVASC) 5 MG tablet Take 1 tablet (5 mg total) by mouth daily.  . [DISCONTINUED] EPIPEN 2-PAK 0.3 MG/0.3ML SOAJ injection   . [DISCONTINUED] gabapentin (NEURONTIN) 300 MG capsule Take 300 mg (1 tab) today, then you can take 300 mg (2 tabs) twice daily as needed  . [DISCONTINUED] hydrochlorothiazide (HYDRODIURIL) 25 MG tablet Take 1 tablet (25 mg total) by mouth daily.  . [DISCONTINUED] potassium chloride (K-DUR,KLOR-CON) 10 MEQ tablet TAKE 1 TABLET DAILY  . [DISCONTINUED] VENTOLIN HFA 108 (90 Base) MCG/ACT inhaler USE 2 INHALATIONS EVERY 4 HOURS AS NEEDED FOR WHEEZING OR SHORTNESS OF BREATH  . albuterol (PROVENTIL HFA;VENTOLIN HFA) 108 (90 Base) MCG/ACT inhaler Inhale 2 puffs into the lungs every 6 (six) hours as needed for wheezing or shortness of breath. Needs pro air   No facility-administered encounter medications on file as of 07/31/2018.     Review  of Systems  Constitutional: Negative for appetite change and unexpected weight change.  HENT: Positive for congestion.        Recent sinus infection as outlined.  Better.  Being treated.  Respiratory: Negative for cough, chest tightness and shortness of breath.   Cardiovascular: Negative for chest pain, palpitations and leg swelling.  Gastrointestinal: Negative for abdominal pain, diarrhea, nausea and vomiting.  Genitourinary: Negative for difficulty urinating and dysuria.  Musculoskeletal: Negative for joint swelling and myalgias.  Skin: Negative for color change and rash.  Neurological: Negative for dizziness, light-headedness and headaches.  Psychiatric/Behavioral: Negative for agitation and dysphoric mood.       Objective:    Physical Exam Constitutional:      General: She is not in acute distress.    Appearance: Normal appearance.  HENT:     Nose: Nose normal. No congestion.     Mouth/Throat:     Pharynx: No oropharyngeal exudate or posterior oropharyngeal erythema.  Neck:     Musculoskeletal: Neck supple. No muscular tenderness.     Thyroid: No thyromegaly.  Cardiovascular:     Rate and Rhythm: Normal rate and regular rhythm.  Pulmonary:     Effort: No respiratory distress.     Breath sounds: Normal breath sounds. No wheezing.  Abdominal:     General: Bowel sounds are normal.     Palpations: Abdomen is soft.     Tenderness: There is no abdominal tenderness.  Musculoskeletal:        General: No swelling or tenderness.  Lymphadenopathy:     Cervical: No cervical adenopathy.  Skin:    Findings: No erythema or rash.  Neurological:     Mental Status: She is alert.  Psychiatric:        Mood and Affect: Mood normal.        Behavior: Behavior normal.     BP (!) 126/58   Pulse 89   Temp 97.9 F (36.6 C) (Oral)   Ht 5' 3" (1.6 m)   Wt 180 lb 9.6 oz (81.9 kg)   LMP 07/21/1988   SpO2 96%   BMI 31.99 kg/m  Wt Readings from Last 3 Encounters:  07/31/18 180 lb  9.6 oz (81.9 kg)  04/21/18 180 lb 12.8 oz (82 kg)  02/23/18 180 lb 4 oz (81.8 kg)     Lab Results  Component Value Date   WBC 5.3 02/07/2018   HGB 12.2 02/07/2018   HCT 35.8 (L) 02/07/2018   PLT 255.0 02/07/2018   GLUCOSE 108 (H) 04/19/2018   CHOL 195 04/19/2018   TRIG 91.0 04/19/2018   HDL 75.60 04/19/2018   LDLDIRECT 101.4 05/07/2013   LDLCALC 101 (H) 04/19/2018   ALT 16 04/19/2018   AST 24 04/19/2018   NA 138 04/19/2018   K 3.8 04/19/2018   CL 104 04/19/2018   CREATININE 0.65 04/19/2018   BUN 9 04/19/2018   CO2 29 04/19/2018   TSH 1.54 04/19/2018   HGBA1C 5.9 04/19/2018   MICROALBUR <0.7 04/19/2018    Dg Bone Density  Result Date: 12/01/2016 EXAM: DUAL X-RAY ABSORPTIOMETRY (DXA) FOR BONE MINERAL DENSITY IMPRESSION: Dear Dr Einar Pheasant, Your patient Raziya Aveni completed a BMD test on 12/01/2016 using the [place] (analysis version: 14.10) manufactured by [employer]. The following summarizes the results of our evaluation. PATIENT BIOGRAPHICAL: Name: Seraphina, Mitchner Patient ID: [id number] Birth Date: Apr 07, 1946 Height: 63.0 in. Gender: Female Exam Date: 12/01/2016 Weight: 177.6 lbs. Indications: Advanced Age, arthritis, Caucasian, Family Hx of Osteoporosis, high risk meds, History of Fracture (Adult), hx breast ca, Hysterectomy, POSTmenopausal Fractures: right tib/fib Treatments: aspirin, Calcium, multivitamin, tamoxifen, Vitamin D ASSESSMENT: The BMD measured at Femur Neck Left is 0.851  g/cm2 with a T-score of -1.3. This patient is considered osteopenic according to [facility]) criteria.L2 &3 were excluded due to degenerative changes. Site Region Measured Measured WHO Young Adult BMD Date       Age      Classification T-score DualFemur Neck Left 12/01/2016 71.3 Osteopenia -1.3 0.851 g/cm2 AP Spine L1-L4 (L2,L3) 12/01/2016 71.3 Normal -0.6 1.096 g/cm2 World Health Organization [facility]) criteria for post-menopausal, Caucasian Women:  Normal:       T-score at or above -1 SD Osteopenia:   T-score between -1 and -2.5 SD Osteoporosis: T-score at or below -2.5 SD RECOMMENDATIONS: [place] recommends that FDA-approved medical therapies be considered in postemenopausal women and men age 73 or older with a: 1. Hip or vertebral (clinical or morphometric) fracture. 2. T-score of < -2.5at the spine or hip. 3. Ten-year fracture probability by FRAX of 3% or greater for hip fracture or 20% or greater for major osteoporotic fracture. All treatment decisions require clinical judgment and consideration of individual patient factors, including patient preferences, co-morbidities, previous drug use, risk factors not captured in the FRAX model (e.g. falls, vitamin D deficiency, increased bone turnover, interval significant decline in bone density) and possible under - or over-estimation of fracture risk by FRAX. All patients should ensure an adequate intake of dietary calcium (1200 mg/d) and vitamin D (800 IU daily) unless contraindicated. FOLLOW-UP: People with diagnosed cases of osteoporosis or at high risk for fracture should have regular bone mineral density tests. For patients eligible for Medicare, routine testing is allowed once every 2 years. The testing frequency can be increased to one year for patients who have rapidly progressing disease, those who are receiving or discontinuing medical therapy to restore bone mass, or have additional risk factors. I have reviewed this report, and agree with the above findings. [facility] Dear Dr Einar Pheasant, Your patient MANNAT BENEDETTI completed a FRAX assessment on 12/01/2016 using the [place] (analysis version: 14.10) manufactured by [employer]. The following summarizes the results of our evaluation. PATIENT BIOGRAPHICAL: Name: Keziah, Avis Patient ID: [id number] Birth Date: [birth year] Height:    63.0 in. Gender:     [age]    [age]        Weight:    177.6 lbs. Ethnicity:  White                            Exam Date: 12/01/2016 FRAX* RESULTS:  (version: 3.5) 10-year Probability of Fracture1 Major Osteoporotic Fracture2 Hip Fracture 15.0% 2.0% Population: [place] (Caucasian) Risk Factors: History of Fracture (Adult) Based on Femur (Left) Neck BMD 1 -The 10-year probability of fracture may be lower than reported if the patient has received treatment. 2 -Major Osteoporotic Fracture: Clinical Spine, Forearm, Hip or Shoulder *FRAX is a Materials engineer of the [employer] for Metabolic Bone Disease, a [place] (WHO) [facility]. ASSESSMENT: The probability of a major osteoporotic fracture is 15.0% within the next ten years. The probability of a hip fracture is 2.0% within the next ten years. Electronically Signed   By: Rolm Baptise M.D.   On: 12/01/2016 10:46       Assessment & Plan:   Problem List Items Addressed This Visit    Abnormal liver function test    Diet and exercise.  Follow liver panel.  Relevant Orders   Hepatic function panel   Anemia    Follow cbc and ferritin.        GERD (gastroesophageal reflux disease)    Some burping occasionally.  No increased acid reflux.  Overall she feels things are stable.  pepcid as directed.        Hypercholesterolemia    Intolerant to multiple statin medications.  Have discussed other treatment options.  She wanted to hold on additional medication.  Low cholesterol diet and exercise.  Follow lipid panel.        Relevant Medications   amLODipine (NORVASC) 5 MG tablet   hydrochlorothiazide (HYDRODIURIL) 25 MG tablet   Other Relevant Orders   Lipid panel   Hyperglycemia    Low carb diet and exercise.  Follow met b and a1c.        Relevant Orders   Hemoglobin A1c   Hypertension    Blood pressure under good control.  Continue same medication regimen.  Follow pressures.  Follow metabolic panel.        Relevant Medications    amLODipine (NORVASC) 5 MG tablet   hydrochlorothiazide (HYDRODIURIL) 25 MG tablet   Other Relevant Orders   Basic metabolic panel   Neoplasm of left breast, primary tumor 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)    Followed by Dr Bary Castilla.  Was on tamoxifen.        Sinusitis - Primary       Einar Pheasant, MD

## 2018-07-31 NOTE — Patient Instructions (Signed)
pepcid 20mg  - take one per day (instead of zantac)

## 2018-08-02 ENCOUNTER — Encounter: Payer: Self-pay | Admitting: Internal Medicine

## 2018-08-02 NOTE — Assessment & Plan Note (Signed)
Blood pressure under good control.  Continue same medication regimen.  Follow pressures.  Follow metabolic panel.   

## 2018-08-02 NOTE — Assessment & Plan Note (Signed)
Follow cbc and ferritin.  

## 2018-08-02 NOTE — Assessment & Plan Note (Signed)
Intolerant to multiple statin medications.  Have discussed other treatment options.  She wanted to hold on additional medication.  Low cholesterol diet and exercise.  Follow lipid panel.

## 2018-08-02 NOTE — Assessment & Plan Note (Signed)
Low carb diet and exercise.  Follow met b and a1c.   

## 2018-08-02 NOTE — Assessment & Plan Note (Signed)
Some burping occasionally.  No increased acid reflux.  Overall she feels things are stable.  pepcid as directed.

## 2018-08-02 NOTE — Assessment & Plan Note (Signed)
Followed by Dr Bary Castilla.  Was on tamoxifen.

## 2018-08-02 NOTE — Assessment & Plan Note (Signed)
Diet and exercise.  Follow liver panel.   

## 2018-08-08 ENCOUNTER — Telehealth: Payer: Self-pay | Admitting: General Surgery

## 2018-08-08 NOTE — Telephone Encounter (Signed)
Patient was calling just to inform Dr. Bary Castilla that when she was showering she found a lump on her left breast. Please call patient and advise.

## 2018-08-08 NOTE — Telephone Encounter (Signed)
She denies redness or swelling, only tender to touch. I talked with the patient and she is aware that she can see her PCP sooner if things worsen or change, also offered another physician here at [facility] if needed, pt agrees.

## 2018-08-10 ENCOUNTER — Telehealth: Payer: Self-pay | Admitting: Internal Medicine

## 2018-08-10 ENCOUNTER — Ambulatory Visit (INDEPENDENT_AMBULATORY_CARE_PROVIDER_SITE_OTHER): Payer: Medicare Other

## 2018-08-10 VITALS — BP 120/80 | HR 86 | Temp 97.6°F | Resp 16 | Ht 62.5 in | Wt 177.1 lb

## 2018-08-10 DIAGNOSIS — Z0000 Encounter for general adult medical examination without abnormal findings: Secondary | ICD-10-CM

## 2018-08-10 NOTE — Progress Notes (Signed)
Subjective:   Helen Marshall is a 73 y.o. female who presents for Medicare Annual (Subsequent) preventive examination.  Review of Systems:  No ROS.  Medicare Wellness Visit. Additional risk factors are reflected in the social history. Cardiac Risk Factors include: advanced age (>43men, >45 women);hypertension     Objective:     Vitals: BP 120/80 (BP Location: Right Arm, Patient Position: Sitting, Cuff Size: Normal)   Pulse 86   Temp 97.6 F (36.4 C) (Oral)   Resp 16   Ht 5' 2.5" (1.588 m)   Wt 177 lb 1.9 oz (80.3 kg)   LMP 07/21/1988   SpO2 98%   BMI 31.88 kg/m   Body mass index is 31.88 kg/m.  Advanced Directives 08/10/2018 08/09/2017 04/18/2017 07/12/2016 03/04/2016 02/12/2016 07/17/2015  Does Patient Have a Medical Advance Directive? Yes Yes Yes Yes Yes Yes Yes  Type of Advance Directive Living will;Healthcare Power of [place];Living will [place];Living will [place];Living will Living will;Healthcare Power of [place];Living will [place];Living will  Does patient want to make changes to medical advance directive? No - Patient declined No - Patient declined - No - Patient declined - No - Patient declined No - Patient declined  Copy of [place] in Chart? No - copy requested No - copy requested - No - copy requested - No - copy requested No - copy requested    Tobacco Social History   Tobacco Use  Smoking Status Never Smoker  Smokeless Tobacco Never Used     Counseling given: Not Answered   Clinical Intake:  Pre-visit preparation completed: Yes  Pain : No/denies pain     Diabetes: No  How often do you need to have someone help you when you read instructions, pamphlets, or other written materials from your doctor or pharmacy?: 1 - Never  Interpreter Needed?: No     Past Medical History:  Diagnosis Date  . Abnormal LFTs     . Abnormal mammogram 12/19/2012   left  . Allergy   . Anemia   . Cancer (HCC)    Breast  . Carotid bruit    left  . Cataract   . Colon polyps   . Diverticulosis   . Diverticulosis 2012  . Environmental allergies   . GERD (gastroesophageal reflux disease)   . Heart murmur   . Hypercholesterolemia   . Hyperglycemia   . Hypertension   . IBS (irritable bowel syndrome)   . Lipoma of colon   . Lumbar disc disease   . 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  . Tubular adenoma of colon    Past Surgical History:  Procedure Laterality Date  . ABDOMINAL HYSTERECTOMY  1989   fibroids  . ANKLE SURGERY Left 1999  . BREAST SURGERY Left 1970   biopsy  . CATARACT EXTRACTION, BILATERAL  June & July 2017  . CHOLECYSTECTOMY  2001  . COLONOSCOPY  06/02/2011   Verdie Shire, MD; diverticulosis, submucosal lipoma of the sigmoid colon.  Marland Kitchen HERNIA REPAIR  2001, 2004  . HERNIA REPAIR  01/22/2013   Repeat repair of umbilical defect, 2.5 cm. Primary repair.  Marland Kitchen NASAL SINUS SURGERY  1997  . rotator cuff surgery  1998  . SQUAMOUS CELL CARCINOMA EXCISION Right 05/2013  shoulder  . TUBAL LIGATION  1976  . UPPER GI ENDOSCOPY  08/22/14   Dr Billie Lade   Family History  Problem Relation Age of Onset  . CVA Father   . Alcohol abuse Father   . Heart disease Mother        myocardial infarction  . Diabetes Mother   . Hearing loss Mother   . Hypertension Brother        x2  . Arthritis Brother   . Hearing loss Brother   . Alcohol abuse Brother   . Arthritis Brother   . Hearing loss Brother   . Arthritis/Rheumatoid Sister        x2  . Breast cancer Sister 21  . Arthritis Sister   . Hearing loss Sister   . Arthritis Sister   . Hearing loss Sister   . Asthma Sister   . Arthritis Sister   . Hearing loss Sister   . Hypothyroidism Sister   .  Arthritis Sister   . Hearing loss Sister   . Cancer Other        breast - paternal first cousin  . Colon cancer Maternal Uncle   . Colon polyps Son   . Colon polyps Sister   . Arthritis Sister   . Hearing loss Sister    Social History   Socioeconomic History  . Marital status: Married    Spouse name: Not on file  . Number of children: 2  . Years of education: Not on file  . Highest education level: Not on file  Occupational History  . Occupation: Retired  Scientific laboratory technician  . Financial resource strain: Not hard at all  . Food insecurity:    Worry: Never true    Inability: Never true  . Transportation needs:    Medical: No    Non-medical: No  Tobacco Use  . Smoking status: Never Smoker  . Smokeless tobacco: Never Used  Substance and Sexual Activity  . Alcohol use: No    Alcohol/week: 0.0 standard drinks  . Drug use: No  . Sexual activity: Never  Lifestyle  . Physical activity:    Days per week: 0 days    Minutes per session: Not on file  . Stress: Not at all  Relationships  . Social connections:    Talks on phone: Not on file    Gets together: Not on file    Attends religious service: Not on file    Active member of club or organization: Not on file    Attends meetings of clubs or organizations: Not on file    Relationship status: Married  Other Topics Concern  . Not on file  Social History Narrative  . Not on file    Outpatient Encounter Medications as of 08/10/2018  Medication Sig  . albuterol (PROVENTIL HFA;VENTOLIN HFA) 108 (90 Base) MCG/ACT inhaler Inhale 2 puffs into the lungs every 6 (six) hours as needed for wheezing or shortness of breath. Needs pro air  . amLODipine (NORVASC) 5 MG tablet Take 1 tablet (5 mg total) by mouth daily.  . benazepril (LOTENSIN) 40 MG tablet Take 1 tablet (40 mg total) by mouth daily.  . calcium-vitamin D (CALCIUM 500+D) 500-400 MG-UNIT per tablet Take 1 tablet by mouth daily.   . fish oil-omega-3 fatty acids 1000 MG capsule 5  (five) times daily.   . fluticasone (FLONASE) 50 MCG/ACT nasal spray Place 2 sprays into both nostrils as needed.  . hydrochlorothiazide (HYDRODIURIL) 25 MG tablet Take 1  tablet (25 mg total) by mouth daily.  . iron polysaccharides (NU-IRON) 150 MG capsule Take 1 capsule (150 mg total) by mouth daily.  . Multiple Vitamin (MULTIVITAMIN) tablet Take 1 tablet by mouth daily.  . niacin (NIASPAN) 1000 MG CR tablet Take 1,000 mg by mouth 2 (two) times daily.  . potassium chloride (K-DUR,KLOR-CON) 10 MEQ tablet Take 1 tablet (10 mEq total) by mouth daily.  . Probiotic Product (VSL#3 PO) Take by mouth. 1 capsul daily  . ranitidine (ZANTAC) 150 MG tablet TAKE 1 TABLET BY MOUTH TWICE DAILY AS NEEDED FOR HEARTBURN.  . tamoxifen (NOLVADEX) 20 MG tablet Take 1 tablet (20 mg total) by mouth daily.  Marland Kitchen triamcinolone cream (KENALOG) 0.1 % Apply 1 application topically as needed.    No facility-administered encounter medications on file as of 08/10/2018.     Activities of Daily Living In your present state of health, do you have any difficulty performing the following activities: 08/10/2018  Hearing? N  Vision? N  Difficulty concentrating or making decisions? N  Walking or climbing stairs? N  Dressing or bathing? N  Doing errands, shopping? N  Preparing Food and eating ? N  Using the Toilet? N  In the past six months, have you accidently leaked urine? N  Do you have problems with loss of bowel control? N  Managing your Medications? N  Managing your Finances? N  Housekeeping or managing your Housekeeping? N  Some recent data might be hidden    Patient Care Team: Einar Pheasant, MD as PCP - General (Internal Medicine) Bary Castilla Forest Gleason, MD (General Surgery)    Assessment:   This is a routine wellness examination for [employer].  Notes discovery of lump L breast 1 week ago. Mammogram scheduled 1/10. Tearful when discussing. Appointment offered with pcp, declined. Encouraged to follow up as needed.    Return on 1/24 for fasting labs.  Health Screenings  Mammogram-03/06/18 Colonoscopy-03/04/16 Bone Density-12/01/16 Glaucoma -none reported Hearing- no difficulty hearing Hemoglobin A1C-(5.9) Cholesterol-04/19/18 (195)  Social  Alcohol intake-none Smoking history- none Smokers in home? none Illicit drug use? none Exercise-none. Plans to walk.  Diet-regular Sexually Active -never  Safety  Patient feels safe at home.  Patient does have smoke detectors at home  Patient does wear sunscreen or protective clothing when in direct sunlight  Patient does wear seat belt when driving or riding with others.   Activities of Daily Living Patient can do their own household chores. Denies needing assistance with: driving, feeding themselves, getting from bed to chair, getting to the toilet, bathing/showering, dressing, managing money, climbing flight of stairs, or preparing meals.   Depression Screen Not resting well, harder to focus due to recent lump finding in L breast. Appetite is good. Encouraged to schedule OV with pcp as needed.    Fall Screen Patient denies being afraid of falling or falling in the last year.   Memory Screen Patient denies problems with memory, misplacing items, and is able to balance checkbook/bank accounts.  Patient is alert, normal appearance, oriented to person/place/and time. Correctly identified the president of the [place], recall of 2/3 objects, and performing simple calculations.  Patient displays appropriate judgement and can read correct time from watch face.   Immunizations The following Immunizations are up to date: Influenza, shingles, pneumonia, and tetanus.   Other Providers Patient Care Team: Einar Pheasant, MD as PCP - General (Internal Medicine) Bary Castilla Forest Gleason, MD (General Surgery)  Exercise Activities and Dietary recommendations Current Exercise Habits: The patient does not  participate in regular exercise at present  Goals      Patient  Stated   . Weight (lb) < 177 lb (80.3 kg) (pt-stated)       Fall Risk Fall Risk  08/10/2018 08/09/2017 07/19/2017 07/12/2016 11/05/2015  Falls in the past year? 0 No No No No   Depression Screen PHQ 2/9 Scores 08/10/2018 08/09/2017 07/19/2017 07/12/2016  PHQ - 2 Score 1 0 0 0  PHQ- 9 Score - 0 - -     Cognitive Function MMSE - Mini Mental State Exam 08/09/2017 07/12/2016 07/17/2015  Orientation to time 5 5 5   Orientation to Place 5 5 5   Registration 3 3 3   Attention/ Calculation 5 5 5   Recall 3 3 3   Language- name 2 objects 2 2 2   Language- repeat 1 1 1   Language- follow 3 step command 3 3 3   Language- read & follow direction 1 1 1   Write a sentence 1 1 1   Copy design 1 1 1   Total score 30 30 30      6CIT Screen 08/10/2018  What Year? 0 points  What month? 0 points  What time? 0 points  Count back from 20 0 points  Months in reverse 0 points  Repeat phrase 0 points  Total Score 0    Immunization History  Administered Date(s) Administered  . Influenza Split 05/16/2012, 04/02/2017  . Influenza, High Dose Seasonal PF 05/15/2016, 03/30/2017, 05/15/2018  . Influenza,inj,Quad PF,6+ Mos 05/09/2013, 05/23/2014, 07/03/2015  . Pneumococcal Conjugate-13 09/13/2013  . Pneumococcal Polysaccharide-23 11/08/2014  . Pneumococcal-Unspecified 08/02/2009  . Tdap 04/26/2014  . Zoster 08/02/2009  . Zoster Recombinat (Shingrix) 01/03/2018, 03/29/2018   Screening Tests Health Maintenance  Topic Date Due  . MAMMOGRAM  03/07/2019  . COLONOSCOPY  03/04/2021  . TETANUS/TDAP  04/26/2024  . INFLUENZA VACCINE  Completed  . DEXA SCAN  Completed  . Hepatitis C Screening  Completed  . PNA vac Low Risk Adult  Completed       Plan:    End of life planning; Advance aging; Advanced directives discussed. Copy of current HCPOA/Living Will requested.    I have personally reviewed and noted the following in the patient's chart:   . Medical and social history . Use of alcohol, tobacco or illicit  drugs  . Current medications and supplements . Functional ability and status . Nutritional status . Physical activity . Advanced directives . List of other physicians . Hospitalizations, surgeries, and ER visits in previous 12 months . Vitals . Screenings to include cognitive, depression, and falls . Referrals and appointments  In addition, I have reviewed and discussed with patient certain preventive protocols, quality metrics, and best practice recommendations. A written personalized care plan for preventive services as well as general preventive health recommendations were provided to patient.     Varney Biles, LPN  09/30/5398   Reviewed above.  F/u regarding breast nodule.  In reviewing, it appears she has been in contact with Dr Dwyane Luo office.    Dr Nicki Reaper

## 2018-08-10 NOTE — Telephone Encounter (Signed)
-----   Message from Dia Crawford, LPN sent at 11/01/3242  3:34 PM EST ----- Regarding: RE: f/u breast nodule Yes she is aware of appointment scheduled with Dr. Bary Castilla 2/18. No follow up with you necessary at this time.  Encouraged her to call as needed or if anything we can do.   Helen Marshall ----- Message ----- From: Einar Pheasant, MD Sent: 08/10/2018   1:40 PM EST To: Bryson Corona O'Brien-Blaney, LPN Subject: f/u breast nodule                              I reviewed your note regarding her breast nodule.  It appears she has been in contact with Dr Dwyane Luo office.  Please f/u with her and confirm this is correct and make sure she is aware if I need to see her to let me know.    Thanks   Dr Nicki Reaper

## 2018-08-10 NOTE — Patient Instructions (Addendum)
  Helen Marshall , Thank you for taking time to come for your Medicare Wellness Visit. I appreciate your ongoing commitment to your health goals. Please review the following plan we discussed and let me know if I can assist you in the future.   Follow up as needed.    Bring a copy of your [place] and/or Living Will to be scanned into chart.  Have a great day!  These are the goals we discussed: Goals      Patient Stated   . Weight (lb) < 177 lb (80.3 kg) (pt-stated)       This is a list of the screening recommended for you and due dates:  Health Maintenance  Topic Date Due  . Mammogram  03/07/2019  . Colon Cancer Screening  03/04/2021  . Tetanus Vaccine  04/26/2024  . Flu Shot  Completed  . DEXA scan (bone density measurement)  Completed  .  Hepatitis C: One time screening is recommended by Center for Disease Control  (CDC) for  adults born from [birth year] through 1966.   Completed  . Pneumonia vaccines  Completed

## 2018-08-25 ENCOUNTER — Telehealth: Payer: Self-pay | Admitting: Internal Medicine

## 2018-08-25 ENCOUNTER — Other Ambulatory Visit (INDEPENDENT_AMBULATORY_CARE_PROVIDER_SITE_OTHER): Payer: Medicare Other

## 2018-08-25 DIAGNOSIS — R945 Abnormal results of liver function studies: Secondary | ICD-10-CM

## 2018-08-25 DIAGNOSIS — I10 Essential (primary) hypertension: Secondary | ICD-10-CM

## 2018-08-25 DIAGNOSIS — E7800 Pure hypercholesterolemia, unspecified: Secondary | ICD-10-CM

## 2018-08-25 DIAGNOSIS — R7989 Other specified abnormal findings of blood chemistry: Secondary | ICD-10-CM

## 2018-08-25 DIAGNOSIS — R739 Hyperglycemia, unspecified: Secondary | ICD-10-CM

## 2018-08-25 LAB — BASIC METABOLIC PANEL
BUN: 8 mg/dL (ref 6–23)
CO2: 30 mEq/L (ref 19–32)
Calcium: 9.3 mg/dL (ref 8.4–10.5)
Chloride: 102 mEq/L (ref 96–112)
Creatinine, Ser: 0.66 mg/dL (ref 0.40–1.20)
GFR: 87.77 mL/min (ref >60.00)
Glucose, Bld: 120 mg/dL — ABNORMAL HIGH (ref 70–99)
POTASSIUM: 3.6 meq/L (ref 3.5–5.1)
Sodium: 139 mEq/L (ref 135–145)

## 2018-08-25 LAB — LIPID PANEL
CHOLESTEROL: 219 mg/dL — AB (ref 0–200)
HDL: 83.8 mg/dL (ref >39.00)
LDL Cholesterol: 117 mg/dL — ABNORMAL HIGH (ref 0–99)
NonHDL: 135.16
Total CHOL/HDL Ratio: 3
Triglycerides: 91 mg/dL (ref 0.0–149.0)
VLDL: 18.2 mg/dL (ref 0.0–40.0)

## 2018-08-25 LAB — HEPATIC FUNCTION PANEL
ALK PHOS: 48 U/L (ref 39–117)
ALT: 22 U/L (ref 0–35)
AST: 28 U/L (ref 0–37)
Albumin: 3.9 g/dL (ref 3.5–5.2)
Bilirubin, Direct: 0.1 mg/dL (ref 0.0–0.3)
Total Bilirubin: 0.4 mg/dL (ref 0.2–1.2)
Total Protein: 6.1 g/dL (ref 6.0–8.3)

## 2018-08-25 LAB — HEMOGLOBIN A1C: Hgb A1c MFr Bld: 5.9 % (ref 4.6–6.5)

## 2018-08-25 NOTE — Telephone Encounter (Signed)
Pt wanted all of her meds to go to [facility] now. Pt also wanted documented on 05/25/2018 she had Fluzone High Dose 2019-20 SYR and 03/21/2018 Shingrix Vial 10 pl kit.   The receipt is up front in color folder. Thank you!

## 2018-08-25 NOTE — Telephone Encounter (Signed)
Chart updated. Per her chart, flu shot was 05/15/2018

## 2018-08-28 ENCOUNTER — Encounter: Payer: Self-pay | Admitting: Internal Medicine

## 2018-09-13 ENCOUNTER — Encounter: Payer: Self-pay | Admitting: General Surgery

## 2018-09-19 ENCOUNTER — Other Ambulatory Visit: Payer: Self-pay

## 2018-09-19 ENCOUNTER — Encounter: Payer: Self-pay | Admitting: General Surgery

## 2018-09-19 ENCOUNTER — Ambulatory Visit (INDEPENDENT_AMBULATORY_CARE_PROVIDER_SITE_OTHER): Payer: Medicare Other | Admitting: General Surgery

## 2018-09-19 VITALS — BP 132/78 | HR 82 | Temp 97.6°F | Resp 13 | Ht 62.5 in | Wt 180.0 lb

## 2018-09-19 DIAGNOSIS — D0512 Intraductal carcinoma in situ of left breast: Secondary | ICD-10-CM | POA: Diagnosis not present

## 2018-09-19 NOTE — Patient Instructions (Addendum)
The patient is aware to call back for any questions or new concerns. Patient will be asked to return to the office in one year with a bilateral diagnostic mammogram.

## 2018-09-19 NOTE — Progress Notes (Signed)
Patient ID: Helen Marshall, female   DOB: Dec 10, 1945, 73 y.o.   MRN: [id number]  Chief Complaint  Patient presents with  . Follow-up    HPI Helen Marshall is a 73 y.o. female.  who presents for follow up left breast cancer and a breast evaluation. The most recent mammogram and left breast ultrasound was done on 09-12-18 at [facility]. She does feel a knot, below her scar and noticed this since first of year. Patient does perform regular self breast checks and gets regular mammograms done.    She completed the Tamoxifen in December, 5 years was September.   HPI  Past Medical History:  Diagnosis Date  . Abnormal LFTs   . Abnormal mammogram 12/19/2012   left  . Allergy   . Anemia   . Cancer [facility]) 2014   left Breast  . Carotid bruit    left  . Cataract   . Colon polyps   . Diverticulosis   . Diverticulosis 2012  . Environmental allergies   . GERD (gastroesophageal reflux disease)   . Heart murmur   . Hypercholesterolemia   . Hyperglycemia   . Hypertension   . IBS (irritable bowel syndrome)   . Lipoma of colon   . Lumbar disc disease   . 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  . Tubular adenoma of colon     Past Surgical History:  Procedure Laterality Date  . ABDOMINAL HYSTERECTOMY  1989   fibroids  . ANKLE SURGERY Left 1999  . BREAST SURGERY Left 1970   biopsy  . CATARACT EXTRACTION, BILATERAL  June & July 2017  . CHOLECYSTECTOMY  2001  . COLONOSCOPY  06/02/2011   Verdie Shire, MD; diverticulosis, submucosal lipoma of the sigmoid colon.  Marland Kitchen HERNIA REPAIR  2001, 2004  . HERNIA REPAIR  01/22/2013   Repeat repair of umbilical defect, 2.5 cm. Primary repair.  Marland Kitchen NASAL SINUS SURGERY  1997  . rotator cuff surgery  1998  . SQUAMOUS CELL CARCINOMA EXCISION Right 05/2013    shoulder  . TUBAL LIGATION  1976  . UPPER GI ENDOSCOPY  08/22/14   Dr Billie Lade    Family History  Problem Relation Age of Onset  . CVA Father   . Alcohol abuse Father   . Heart disease Mother        myocardial infarction  . Diabetes Mother   . Hearing loss Mother   . Hypertension Brother        x2  . Arthritis Brother   . Hearing loss Brother   . Alcohol abuse Brother   . Arthritis Brother   . Hearing loss Brother   . Arthritis/Rheumatoid Sister        x2  . Breast cancer Sister 48  . Arthritis Sister   . Hearing loss Sister   . Arthritis Sister   . Hearing loss Sister   . Asthma Sister   . Arthritis Sister   . Hearing loss Sister   . Hypothyroidism Sister   . Arthritis Sister   . Hearing loss Sister   . Cancer Other        breast - paternal first cousin  . Colon cancer Maternal Uncle   . Colon polyps Son   . Colon polyps Sister   . Arthritis Sister   .  Hearing loss Sister     Social History Social History   Tobacco Use  . Smoking status: Never Smoker  . Smokeless tobacco: Never Used  Substance Use Topics  . Alcohol use: No    Alcohol/week: 0.0 standard drinks  . Drug use: No    Allergies  Allergen Reactions  . Crestor [Rosuvastatin] Other (See Comments)    Leg cramping   . Demerol  [Meperidine Hcl]   . Demerol [Meperidine] Nausea And Vomiting  . Gabapentin   . Latex Other (See Comments)    Redness, hands breakout  . Lipitor [Atorvastatin] Other (See Comments)    Intolerant   . Lovastatin Other (See Comments)    Elevated CK  . Pravastatin Other (See Comments)    intolerant  . Tramadol Nausea Only  . Vicodin [Hydrocodone-Acetaminophen] Other (See Comments)    itching  . Zocor [Simvastatin] Other (See Comments)    Intolerant     Current Outpatient Medications  Medication Sig Dispense Refill  . albuterol (PROVENTIL HFA;VENTOLIN HFA) 108 (90 Base) MCG/ACT inhaler Inhale 2 puffs into the lungs every 6 (six) hours as needed for wheezing or  shortness of breath. Needs pro air 3 Inhaler 1  . amLODipine (NORVASC) 5 MG tablet Take 1 tablet (5 mg total) by mouth daily. 90 tablet 1  . benazepril (LOTENSIN) 40 MG tablet Take 1 tablet (40 mg total) by mouth daily. 90 tablet 1  . calcium-vitamin D (CALCIUM 500+D) 500-400 MG-UNIT per tablet Take 1 tablet by mouth daily.     . famotidine (PEPCID) 20 MG tablet Take 20 mg by mouth daily.    . fish oil-omega-3 fatty acids 1000 MG capsule 5 (five) times daily.     . fluticasone (FLONASE) 50 MCG/ACT nasal spray Place 2 sprays into both nostrils as needed. 16 g 5  . hydrochlorothiazide (HYDRODIURIL) 25 MG tablet Take 1 tablet (25 mg total) by mouth daily. 90 tablet 1  . iron polysaccharides (NU-IRON) 150 MG capsule Take 1 capsule (150 mg total) by mouth daily. 90 capsule 1  . Multiple Vitamin (MULTIVITAMIN) tablet Take 1 tablet by mouth daily.    . niacin (NIASPAN) 1000 MG CR tablet Take 1,000 mg by mouth 2 (two) times daily.    . potassium chloride (K-DUR,KLOR-CON) 10 MEQ tablet Take 1 tablet (10 mEq total) by mouth daily. 90 tablet 1  . Probiotic Product (VSL#3 PO) Take by mouth. 1 capsul daily    . triamcinolone cream (KENALOG) 0.1 % Apply 1 application topically as needed.      No current facility-administered medications for this visit.     Review of Systems Review of Systems  Constitutional: Negative.   Respiratory: Negative.   Cardiovascular: Negative.     Blood pressure 132/78, pulse 82, temperature 97.6 F (36.4 C), temperature source Temporal, resp. rate 13, height 5' 2.5" (1.588 m), weight 180 lb (81.6 kg), last menstrual period 07/21/1988, SpO2 100 %.  Physical Exam Physical Exam Exam conducted with a chaperone present.  Constitutional:      Appearance: She is well-developed.  Eyes:     General: No scleral icterus.    Conjunctiva/sclera: Conjunctivae normal.  Neck:     Musculoskeletal: Neck supple.  Cardiovascular:     Rate and Rhythm: Normal rate and regular rhythm.      Heart sounds: Normal heart sounds.  Pulmonary:     Effort: Pulmonary effort is normal.     Breath sounds: Normal breath sounds.  Chest:     Breasts:  Right: No inverted nipple, mass, nipple discharge, skin change or tenderness.        Left: No inverted nipple, mass, nipple discharge, skin change or tenderness.       Comments: Scar tissue left breast Lymphadenopathy:     Cervical: No cervical adenopathy.     Upper Body:     Right upper body: No supraclavicular or axillary adenopathy.     Left upper body: No supraclavicular or axillary adenopathy.  Skin:    General: Skin is warm and dry.  Neurological:     Mental Status: She is alert and oriented to person, place, and time.  Psychiatric:        Behavior: Behavior normal.     Data Reviewed Report of the patient's bilateral diagnostic mammograms and left breast ultrasound of September 11, 2018 were reviewed.  No interval change.  Stable over the past year.  One-year follow-up BI-RADS-3.  Assessment    No evidence of recurrent breast cancer.  Patient has completed 5 years of tamoxifen therapy.    Plan    The focal thickening at the lateral aspect of the scar is likely combination of surgical scars overlapping in her radiation.  No radiologic or clinical suspicion for recurrent disease.  The patient is aware to call back for any questions or new concerns. Patient will be asked to return to the office in one year with a bilateral diagnostic mammogram.      HPI, assessment, plan and physical exam has been scribed under the direction and in the presence of Robert Bellow, MD. Karie Fetch, RN  I have completed the exam and reviewed the above documentation for accuracy and completeness.  I agree with the above.  Haematologist has been used and any errors in dictation or transcription are unintentional.  Hervey Ard, M.D., F.A.C.S.   Forest Gleason Elliot Simoneaux 09/19/2018, 11:06 AM

## 2018-10-31 ENCOUNTER — Telehealth: Payer: Self-pay | Admitting: Internal Medicine

## 2018-10-31 ENCOUNTER — Other Ambulatory Visit: Payer: Medicare Other

## 2018-10-31 NOTE — Telephone Encounter (Signed)
Pt coming in to give urine today and will be doing a virtual visit on 4/2

## 2018-10-31 NOTE — Telephone Encounter (Signed)
LMTCB

## 2018-10-31 NOTE — Telephone Encounter (Signed)
Copied from [place] [id number]. Topic: Quick Communication - See Telephone Encounter >> Oct 31, 2018  8:15 AM Rayann Heman wrote: CRM for notification. See Telephone encounter for: 10/31/18. Pt called and stated that she would like a call back about her appointment on 11/02/18. She stated that someone called her about this. She also states that when she urinates very little comes out and has discomfort. Please advise

## 2018-10-31 NOTE — Telephone Encounter (Signed)
Pt is returning [place] call she will keep her phone nearby

## 2018-11-01 ENCOUNTER — Other Ambulatory Visit: Payer: Self-pay

## 2018-11-01 ENCOUNTER — Other Ambulatory Visit (INDEPENDENT_AMBULATORY_CARE_PROVIDER_SITE_OTHER): Payer: Medicare Other

## 2018-11-01 ENCOUNTER — Telehealth: Payer: Self-pay

## 2018-11-01 DIAGNOSIS — R300 Dysuria: Secondary | ICD-10-CM | POA: Diagnosis not present

## 2018-11-01 LAB — URINALYSIS, ROUTINE W REFLEX MICROSCOPIC
Bilirubin Urine: NEGATIVE
Ketones, ur: NEGATIVE
Nitrite: POSITIVE — AB
Specific Gravity, Urine: 1.015 (ref 1.000–1.030)
Total Protein, Urine: 30 — AB
Urine Glucose: NEGATIVE
Urobilinogen, UA: 0.2 (ref 0.0–1.0)
pH: 6.5 (ref 5.0–8.0)

## 2018-11-01 NOTE — Telephone Encounter (Signed)
Called patient to let her know that she will need to come in a give urine sample again. Hers was not sent last night due to not being checked in. Patient stated that was okay. I have reordered UA w/ micro and culture

## 2018-11-02 ENCOUNTER — Ambulatory Visit (INDEPENDENT_AMBULATORY_CARE_PROVIDER_SITE_OTHER): Payer: Medicare Other | Admitting: Internal Medicine

## 2018-11-02 DIAGNOSIS — Z9109 Other allergy status, other than to drugs and biological substances: Secondary | ICD-10-CM

## 2018-11-02 DIAGNOSIS — E7800 Pure hypercholesterolemia, unspecified: Secondary | ICD-10-CM

## 2018-11-02 DIAGNOSIS — D649 Anemia, unspecified: Secondary | ICD-10-CM | POA: Diagnosis not present

## 2018-11-02 DIAGNOSIS — K219 Gastro-esophageal reflux disease without esophagitis: Secondary | ICD-10-CM

## 2018-11-02 DIAGNOSIS — R945 Abnormal results of liver function studies: Secondary | ICD-10-CM

## 2018-11-02 DIAGNOSIS — N3000 Acute cystitis without hematuria: Secondary | ICD-10-CM | POA: Diagnosis not present

## 2018-11-02 DIAGNOSIS — R739 Hyperglycemia, unspecified: Secondary | ICD-10-CM

## 2018-11-02 DIAGNOSIS — R7989 Other specified abnormal findings of blood chemistry: Secondary | ICD-10-CM

## 2018-11-02 DIAGNOSIS — I10 Essential (primary) hypertension: Secondary | ICD-10-CM

## 2018-11-02 MED ORDER — BENAZEPRIL HCL 40 MG PO TABS: 40.0000 mg | ORAL_TABLET | Freq: Every day | ORAL | 1 refills | Status: DC

## 2018-11-02 MED ORDER — CEFDINIR 300 MG PO CAPS: 300.0000 mg | ORAL_CAPSULE | Freq: Two times a day (BID) | ORAL | 0 refills | Status: DC

## 2018-11-03 LAB — URINE CULTURE
MICRO NUMBER:: 368494
SPECIMEN QUALITY:: ADEQUATE

## 2018-11-05 ENCOUNTER — Encounter: Payer: Self-pay | Admitting: Internal Medicine

## 2018-11-05 NOTE — Progress Notes (Addendum)
Patient ID: Helen Marshall, female   DOB: [birth year], 73 y.o.   MRN: [id number] Virtual Visit via Video Note  I connected with Helen Marshall on 11/02/18 at  9:00 AM EDT by a video enabled telemedicine application and verified that I am speaking with the correct person using two identifiers.  Location patient: home Location provider:work  Persons participating in the virtual visit: patient, provider  I discussed the limitations of evaluation and management by telemedicine.  This visit type was conducted due to national recommendations for restrictions regarding the COVID-19 pandemic.  This format is felt to be most appropriate for this patient at this time.   The patient expressed understanding and agreed to proceed.   HPI: This was a scheduled follow up.  She states she has been doing relatively well.  Has started having urinary symptoms.  Noticed approximately 4-5 days ago increased urgency and dysuria.  Previously noticed a dribble with urinating.  This has improved.  Started AZO.  No hematuria.  No nausea or vomiting.  No abdominal ain or back pain.  No fever.  She left urine sample yesterday.  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  Relatively stable.  Discussed stretches and staying active.  No chest pain.  No sob.  No acid reflux.  No problems swallowing.  Still has intermittent flares with her bowels, but overall feels is stable/better.  Saw Dr Bary Castilla 09/19/18 for f/u breast cancer.  Completed 5 years tamoxifen.  Had mammogram and left breast ultrasound 09/12/18.  Stable.  Recommended f/u in one year.  Overall she feels things are stable.     ROS: See pertinent positives and negatives per HPI.  Past Medical History:  Diagnosis Date  . Abnormal LFTs   . Abnormal mammogram 12/19/2012   left  . Allergy   . Anemia   . Cancer [facility]) 2014   left Breast  . Carotid bruit    left  . Cataract   . Colon polyps   . Diverticulosis   . Diverticulosis 2012  . Environmental  allergies   . GERD (gastroesophageal reflux disease)   . Heart murmur   . Hypercholesterolemia   . Hyperglycemia   . Hypertension   . IBS (irritable bowel syndrome)   . Lipoma of colon   . Lumbar disc disease   . 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  . Tubular adenoma of colon     Past Surgical History:  Procedure Laterality Date  . ABDOMINAL HYSTERECTOMY  1989   fibroids  . ANKLE SURGERY Left 1999  . BREAST SURGERY Left 1970   biopsy  . CATARACT EXTRACTION, BILATERAL  June & July 2017  . CHOLECYSTECTOMY  2001  . COLONOSCOPY  06/02/2011   Verdie Shire, MD; diverticulosis, submucosal lipoma of the sigmoid colon.  Marland Kitchen HERNIA REPAIR  2001, 2004  . HERNIA REPAIR  01/22/2013   Repeat repair of umbilical defect, 2.5 cm. Primary repair.  Marland Kitchen NASAL SINUS SURGERY  1997  . rotator cuff surgery  1998  . SQUAMOUS CELL CARCINOMA EXCISION Right 05/2013   shoulder  . TUBAL LIGATION  1976  . UPPER GI ENDOSCOPY  08/22/14   Dr Billie Lade    Family History  Problem Relation Age of Onset  . CVA Father   . Alcohol  abuse Father   . Heart disease Mother        myocardial infarction  . Diabetes Mother   . Hearing loss Mother   . Hypertension Brother        x2  . Arthritis Brother   . Hearing loss Brother   . Alcohol abuse Brother   . Arthritis Brother   . Hearing loss Brother   . Arthritis/Rheumatoid Sister        x2  . Breast cancer Sister 97  . Arthritis Sister   . Hearing loss Sister   . Arthritis Sister   . Hearing loss Sister   . Asthma Sister   . Arthritis Sister   . Hearing loss Sister   . Hypothyroidism Sister   . Arthritis Sister   . Hearing loss Sister   . Cancer Other        breast - paternal first cousin  . Colon cancer Maternal Uncle   . Colon polyps Son   .  Colon polyps Sister   . Arthritis Sister   . Hearing loss Sister     SOCIAL HX: reviewed.    Current Outpatient Medications:  .  albuterol (PROVENTIL HFA;VENTOLIN HFA) 108 (90 Base) MCG/ACT inhaler, Inhale 2 puffs into the lungs every 6 (six) hours as needed for wheezing or shortness of breath. Needs pro air, Disp: 3 Inhaler, Rfl: 1 .  amLODipine (NORVASC) 5 MG tablet, Take 1 tablet (5 mg total) by mouth daily., Disp: 90 tablet, Rfl: 1 .  benazepril (LOTENSIN) 40 MG tablet, Take 1 tablet (40 mg total) by mouth daily., Disp: 90 tablet, Rfl: 1 .  calcium-vitamin D (CALCIUM 500+D) 500-400 MG-UNIT per tablet, Take 1 tablet by mouth daily. , Disp: , Rfl:  .  cefdinir (OMNICEF) 300 MG capsule, Take 1 capsule (300 mg total) by mouth 2 (two) times daily., Disp: 20 capsule, Rfl: 0 .  famotidine (PEPCID) 20 MG tablet, Take 20 mg by mouth daily., Disp: , Rfl:  .  fish oil-omega-3 fatty acids 1000 MG capsule, 5 (five) times daily. , Disp: , Rfl:  .  fluticasone (FLONASE) 50 MCG/ACT nasal spray, Place 2 sprays into both nostrils as needed., Disp: 16 g, Rfl: 5 .  hydrochlorothiazide (HYDRODIURIL) 25 MG tablet, Take 1 tablet (25 mg total) by mouth daily., Disp: 90 tablet, Rfl: 1 .  iron polysaccharides (NU-IRON) 150 MG capsule, Take 1 capsule (150 mg total) by mouth daily., Disp: 90 capsule, Rfl: 1 .  Multiple Vitamin (MULTIVITAMIN) tablet, Take 1 tablet by mouth daily., Disp: , Rfl:  .  niacin (NIASPAN) 1000 MG CR tablet, Take 1,000 mg by mouth 2 (two) times daily., Disp: , Rfl:  .  potassium chloride (K-DUR,KLOR-CON) 10 MEQ tablet, Take 1 tablet (10 mEq total) by mouth daily., Disp: 90 tablet, Rfl: 1 .  Probiotic Product (VSL#3 PO), Take by mouth. 1 capsul daily, Disp: , Rfl:  .  triamcinolone cream (KENALOG) 0.1 %, Apply 1 application topically as needed. , Disp: , Rfl:   EXAM:  GENERAL: alert, oriented, appears well and in no acute distress  HEENT: atraumatic, conjunttiva clear, no obvious  abnormalities on inspection of external nose  NECK: normal movements of the head and neck  LUNGS: on inspection no signs of respiratory distress, breathing rate appears normal, no obvious gross SOB, gasping or wheezing  CV: no obvious cyanosis  PSYCH/NEURO: pleasant and cooperative, no obvious depression or anxiety, speech and thought processing grossly intact  ASSESSMENT AND PLAN:  Discussed the  following assessment and plan:  Acute cystitis without hematuria - Symptoms and urine dip c/w UTI.  Treat with omnicef as directed.  Probiotic as directed.  Culture sent.    Abnormal liver function test  Anemia, unspecified type  Environmental allergies  Gastroesophageal reflux disease without esophagitis  Hypercholesterolemia  Hyperglycemia  Essential hypertension     I discussed the assessment and treatment plan with the patient. The patient was provided an opportunity to ask questions and all were answered. The patient agreed with the plan and demonstrated an understanding of the instructions.   The patient was advised to call back or seek an in-person evaluation if the symptoms worsen or if the condition fails to improve as anticipated.   Einar Pheasant, MD

## 2018-11-05 NOTE — Assessment & Plan Note (Signed)
Low carb diet and exercise.  Follow met b and a1c.   

## 2018-11-05 NOTE — Assessment & Plan Note (Signed)
Follow cbc.  

## 2018-11-05 NOTE — Assessment & Plan Note (Signed)
Intolerant to multiple statin medications.  Have discussed other treatment options.  She wants to hold on additional medication.  Low cholesterol diet and exercise.  Follow lipid panel.

## 2018-11-05 NOTE — Assessment & Plan Note (Signed)
Blood pressure has been well controlled.  Continue current medication regimen.  Follow pressures.  Follow metabolic panel.

## 2018-11-05 NOTE — Assessment & Plan Note (Signed)
Follow liver panel.  

## 2018-11-05 NOTE — Assessment & Plan Note (Signed)
Overall she feels is controlled.  Follow.

## 2018-11-05 NOTE — Assessment & Plan Note (Signed)
Stable.  Has seen Dr Tami Ribas.

## 2018-11-17 ENCOUNTER — Other Ambulatory Visit: Payer: Self-pay

## 2018-11-17 ENCOUNTER — Emergency Department
Admission: EM | Admit: 2018-11-17 | Discharge: 2018-11-17 | Disposition: A | Discharge status: Home / Self Care | Payer: No Typology Code available for payment source | Attending: Emergency Medicine | Admitting: Emergency Medicine

## 2018-11-17 ENCOUNTER — Emergency Department: Payer: No Typology Code available for payment source

## 2018-11-17 ENCOUNTER — Encounter: Payer: Self-pay | Admitting: Intensive Care

## 2018-11-17 DIAGNOSIS — Z79899 Other long term (current) drug therapy: Secondary | ICD-10-CM | POA: Diagnosis not present

## 2018-11-17 DIAGNOSIS — I10 Essential (primary) hypertension: Secondary | ICD-10-CM | POA: Diagnosis not present

## 2018-11-17 DIAGNOSIS — Y999 Unspecified external cause status: Secondary | ICD-10-CM | POA: Insufficient documentation

## 2018-11-17 DIAGNOSIS — S60222A Contusion of left hand, initial encounter: Secondary | ICD-10-CM | POA: Insufficient documentation

## 2018-11-17 DIAGNOSIS — S2000XA Contusion of breast, unspecified breast, initial encounter: Secondary | ICD-10-CM

## 2018-11-17 DIAGNOSIS — Y92410 Unspecified street and highway as the place of occurrence of the external cause: Secondary | ICD-10-CM | POA: Diagnosis not present

## 2018-11-17 DIAGNOSIS — Z91040 Latex allergy status: Secondary | ICD-10-CM | POA: Insufficient documentation

## 2018-11-17 DIAGNOSIS — Z853 Personal history of malignant neoplasm of breast: Secondary | ICD-10-CM | POA: Insufficient documentation

## 2018-11-17 DIAGNOSIS — S2001XA Contusion of right breast, initial encounter: Secondary | ICD-10-CM | POA: Diagnosis not present

## 2018-11-17 DIAGNOSIS — S8992XA Unspecified injury of left lower leg, initial encounter: Secondary | ICD-10-CM | POA: Diagnosis present

## 2018-11-17 DIAGNOSIS — Z85828 Personal history of other malignant neoplasm of skin: Secondary | ICD-10-CM | POA: Insufficient documentation

## 2018-11-17 DIAGNOSIS — Y939 Activity, unspecified: Secondary | ICD-10-CM | POA: Insufficient documentation

## 2018-11-17 DIAGNOSIS — S8012XA Contusion of left lower leg, initial encounter: Secondary | ICD-10-CM | POA: Insufficient documentation

## 2018-11-17 MED ORDER — BACITRACIN-NEOMYCIN-POLYMYXIN 400-5-5000 EX OINT
TOPICAL_OINTMENT | Freq: Once | CUTANEOUS | Status: AC
  Administered 2018-11-17: 1 via TOPICAL

## 2018-11-17 MED ORDER — OXYCODONE HCL 5 MG PO TABS
5.0000 mg | ORAL_TABLET | Freq: Once | ORAL | Status: AC
  Administered 2018-11-17: 5 mg via ORAL
  Filled 2018-11-17: qty 1

## 2018-11-17 MED ORDER — OXYCODONE HCL 5 MG PO TABS: 5.0000 mg | ORAL_TABLET | Freq: Three times a day (TID) | ORAL | 0 refills | Status: DC | PRN
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
## 2018-11-29 ENCOUNTER — Encounter: Payer: Self-pay | Admitting: Internal Medicine

## 2018-11-29 ENCOUNTER — Ambulatory Visit (INDEPENDENT_AMBULATORY_CARE_PROVIDER_SITE_OTHER): Payer: Medicare Other | Admitting: Internal Medicine

## 2018-11-29 DIAGNOSIS — M25572 Pain in left ankle and joints of left foot: Secondary | ICD-10-CM

## 2018-11-29 DIAGNOSIS — M25472 Effusion, left ankle: Secondary | ICD-10-CM

## 2018-11-29 DIAGNOSIS — S82832A Other fracture of upper and lower end of left fibula, initial encounter for closed fracture: Secondary | ICD-10-CM

## 2018-11-29 MED ORDER — OXYCODONE HCL 5 MG PO TABS: 5.0000 mg | ORAL_TABLET | Freq: Three times a day (TID) | ORAL | 0 refills | Status: DC | PRN
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
## 2018-11-29 NOTE — Telephone Encounter (Signed)
Copied from [place] [id number]. Topic: General - Other >> Nov 29, 2018 12:31 PM Mcneil, Ja-Kwan wrote: Reason for CRM: Pt stated she was trying to send the pictures that Dr. Terese Door requested but she is having a problem sending the pictures through [facility]. Pt requests call back. Cb# [phone]

## 2019-02-22 ENCOUNTER — Encounter: Payer: Self-pay | Admitting: Internal Medicine

## 2019-02-27 ENCOUNTER — Encounter: Payer: Self-pay | Admitting: General Surgery

## 2019-03-04 ENCOUNTER — Other Ambulatory Visit: Payer: Self-pay | Admitting: Internal Medicine

## 2019-04-11 ENCOUNTER — Telehealth: Payer: Self-pay | Admitting: *Deleted

## 2019-04-11 DIAGNOSIS — R945 Abnormal results of liver function studies: Secondary | ICD-10-CM

## 2019-04-11 DIAGNOSIS — R7989 Other specified abnormal findings of blood chemistry: Secondary | ICD-10-CM

## 2019-04-11 DIAGNOSIS — R739 Hyperglycemia, unspecified: Secondary | ICD-10-CM

## 2019-04-11 DIAGNOSIS — D649 Anemia, unspecified: Secondary | ICD-10-CM

## 2019-04-11 DIAGNOSIS — E7800 Pure hypercholesterolemia, unspecified: Secondary | ICD-10-CM

## 2019-04-11 NOTE — Telephone Encounter (Signed)
Orders placed for labs

## 2019-04-11 NOTE — Telephone Encounter (Signed)
Please place future orders for lab appt.  

## 2019-04-13 ENCOUNTER — Other Ambulatory Visit: Payer: Self-pay

## 2019-04-13 ENCOUNTER — Other Ambulatory Visit (INDEPENDENT_AMBULATORY_CARE_PROVIDER_SITE_OTHER): Payer: Medicare Other

## 2019-04-13 DIAGNOSIS — D649 Anemia, unspecified: Secondary | ICD-10-CM

## 2019-04-13 DIAGNOSIS — E7800 Pure hypercholesterolemia, unspecified: Secondary | ICD-10-CM

## 2019-04-13 DIAGNOSIS — R739 Hyperglycemia, unspecified: Secondary | ICD-10-CM | POA: Diagnosis not present

## 2019-04-13 LAB — CBC WITH DIFFERENTIAL/PLATELET
Basophils Absolute: 0 10*3/uL (ref 0.0–0.1)
Basophils Relative: 0.9 % (ref 0.0–3.0)
Eosinophils Absolute: 0.3 10*3/uL (ref 0.0–0.7)
Eosinophils Relative: 5.3 % — ABNORMAL HIGH (ref 0.0–5.0)
HCT: 38.7 % (ref 36.0–46.0)
Hemoglobin: 13.3 g/dL (ref 12.0–15.0)
Lymphocytes Relative: 29.3 % (ref 12.0–46.0)
Lymphs Abs: 1.5 10*3/uL (ref 0.7–4.0)
MCHC: 34.2 g/dL (ref 30.0–36.0)
MCV: 91.8 fl (ref 78.0–100.0)
Monocytes Absolute: 0.5 10*3/uL (ref 0.1–1.0)
Monocytes Relative: 10.4 % (ref 3.0–12.0)
Neutro Abs: 2.7 10*3/uL (ref 1.4–7.7)
Neutrophils Relative %: 54.1 % (ref 43.0–77.0)
Platelets: 211 10*3/uL (ref 150.0–400.0)
RBC: 4.22 Mil/uL (ref 3.87–5.11)
RDW: 14.7 % (ref 11.5–15.5)
WBC: 5.1 10*3/uL (ref 4.0–10.5)

## 2019-04-13 LAB — HEPATIC FUNCTION PANEL
ALT: 22 U/L (ref 0–35)
AST: 26 U/L (ref 0–37)
Albumin: 3.9 g/dL (ref 3.5–5.2)
Alkaline Phosphatase: 77 U/L (ref 39–117)
Bilirubin, Direct: 0.1 mg/dL (ref 0.0–0.3)
Total Bilirubin: 0.6 mg/dL (ref 0.2–1.2)
Total Protein: 6.1 g/dL (ref 6.0–8.3)

## 2019-04-13 LAB — BASIC METABOLIC PANEL
BUN: 16 mg/dL (ref 6–23)
CO2: 28 mEq/L (ref 19–32)
Calcium: 9.2 mg/dL (ref 8.4–10.5)
Chloride: 103 mEq/L (ref 96–112)
Creatinine, Ser: 0.73 mg/dL (ref 0.40–1.20)
GFR: 77.99 mL/min (ref >60.00)
Glucose, Bld: 105 mg/dL — ABNORMAL HIGH (ref 70–99)
Potassium: 4 mEq/L (ref 3.5–5.1)
Sodium: 139 mEq/L (ref 135–145)

## 2019-04-13 LAB — LIPID PANEL
Cholesterol: 233 mg/dL — ABNORMAL HIGH (ref 0–200)
HDL: 90.8 mg/dL (ref >39.00)
LDL Cholesterol: 129 mg/dL — ABNORMAL HIGH (ref 0–99)
NonHDL: 142.07
Total CHOL/HDL Ratio: 3
Triglycerides: 63 mg/dL (ref 0.0–149.0)
VLDL: 12.6 mg/dL (ref 0.0–40.0)

## 2019-04-13 LAB — HEMOGLOBIN A1C: Hgb A1c MFr Bld: 6 % (ref 4.6–6.5)

## 2019-04-13 LAB — TSH: TSH: 1.46 u[IU]/mL (ref 0.35–4.50)

## 2019-04-17 ENCOUNTER — Encounter: Payer: Self-pay | Admitting: Internal Medicine

## 2019-04-18 ENCOUNTER — Other Ambulatory Visit: Payer: Self-pay | Admitting: Internal Medicine

## 2019-04-18 ENCOUNTER — Ambulatory Visit (INDEPENDENT_AMBULATORY_CARE_PROVIDER_SITE_OTHER): Payer: Medicare Other | Admitting: Internal Medicine

## 2019-04-18 ENCOUNTER — Encounter: Payer: Self-pay | Admitting: Internal Medicine

## 2019-04-18 ENCOUNTER — Other Ambulatory Visit: Payer: Self-pay

## 2019-04-18 VITALS — BP 116/68 | HR 82 | Temp 97.6°F | Resp 16 | Wt 180.0 lb

## 2019-04-18 DIAGNOSIS — Z0000 Encounter for general adult medical examination without abnormal findings: Secondary | ICD-10-CM | POA: Diagnosis not present

## 2019-04-18 DIAGNOSIS — E7800 Pure hypercholesterolemia, unspecified: Secondary | ICD-10-CM

## 2019-04-18 DIAGNOSIS — D649 Anemia, unspecified: Secondary | ICD-10-CM

## 2019-04-18 DIAGNOSIS — I10 Essential (primary) hypertension: Secondary | ICD-10-CM

## 2019-04-18 DIAGNOSIS — Z23 Encounter for immunization: Secondary | ICD-10-CM

## 2019-04-18 DIAGNOSIS — E611 Iron deficiency: Secondary | ICD-10-CM

## 2019-04-18 DIAGNOSIS — E2839 Other primary ovarian failure: Secondary | ICD-10-CM | POA: Diagnosis not present

## 2019-04-18 DIAGNOSIS — R945 Abnormal results of liver function studies: Secondary | ICD-10-CM

## 2019-04-18 DIAGNOSIS — D0512 Intraductal carcinoma in situ of left breast: Secondary | ICD-10-CM

## 2019-04-18 DIAGNOSIS — R7989 Other specified abnormal findings of blood chemistry: Secondary | ICD-10-CM

## 2019-04-18 DIAGNOSIS — Z9109 Other allergy status, other than to drugs and biological substances: Secondary | ICD-10-CM

## 2019-04-18 DIAGNOSIS — R739 Hyperglycemia, unspecified: Secondary | ICD-10-CM

## 2019-04-18 LAB — IBC + FERRITIN
Ferritin: 21.4 ng/mL (ref 10.0–291.0)
Iron: 89 ug/dL (ref 42–145)
Saturation Ratios: 23.3 % (ref 20.0–50.0)
Transferrin: 273 mg/dL (ref 212.0–360.0)

## 2019-04-18 MED ORDER — BENAZEPRIL HCL 40 MG PO TABS: 40.0000 mg | ORAL_TABLET | Freq: Every day | ORAL | 3 refills | Status: DC

## 2019-04-18 MED ORDER — INTEGRA 62.5-62.5-40-3 MG PO CAPS: ORAL_CAPSULE | ORAL | 2 refills | Status: DC

## 2019-04-18 MED ORDER — AMLODIPINE BESYLATE 5 MG PO TABS: 5.0000 mg | ORAL_TABLET | Freq: Every day | ORAL | 3 refills | Status: DC

## 2019-04-18 MED ORDER — HYDROCHLOROTHIAZIDE 25 MG PO TABS: 25.0000 mg | ORAL_TABLET | Freq: Every day | ORAL | 3 refills | Status: DC
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
## 2019-04-18 NOTE — Progress Notes (Signed)
integra rx sent to local pharmacy.

## 2019-04-18 NOTE — Assessment & Plan Note (Addendum)
Physical today 04/18/19.  Mammogram 09/11/18 -Birads III.  Recommended f/u in 12 months.  Has been evaluated by Dr Bary Castilla.  Colonoscopy 2017 - diverticulosis.  Schedule bone density.

## 2019-04-19 DIAGNOSIS — Z23 Encounter for immunization: Secondary | ICD-10-CM | POA: Diagnosis not present

## 2019-04-20 ENCOUNTER — Other Ambulatory Visit: Payer: Self-pay | Admitting: Internal Medicine

## 2019-04-20 NOTE — Telephone Encounter (Signed)
Copied from [place] [id number]. Topic: Quick Communication - Rx Refill/Question >> Apr 20, 2019  2:19 PM Helen Marshall, [place] A wrote: Medication:fluticasone (FLONASE) 50 MCG/ACT nasal spray (Patient stated that this medication was left out from her recent meds that were sent to pharmacy)  Has the patient contacted their pharmacy? {Yes (Agent: If no, request that the patient contact the pharmacy for the refill.) (Agent: If yes, when and what did the pharmacy advise?)Contact PCP  Preferred Pharmacy (with phone number or street name): [place], [place] [phone] (Phone) [phone] (Fax)    Agent: Please be advised that RX refills may take up to 3 business days. We ask that you follow-up with your pharmacy.

## 2019-04-20 NOTE — Telephone Encounter (Signed)
Requested medication (s) are due for refill today: no  Requested medication (s) are on the active medication list: yes   Future visit scheduled: yes  Notes to clinic:  Review for refill Looks like it been several years since this was ordered  Requested Prescriptions  Pending Prescriptions Disp Refills   fluticasone (FLONASE) 50 MCG/ACT nasal spray 16 g 5    Sig: Place 2 sprays into both nostrils as needed.     Ear, Nose, and Throat: Nasal Preparations - Corticosteroids Passed - 04/20/2019  2:26 PM      Passed - Valid encounter within last 12 months    Recent Outpatient Visits          2 days ago Estrogen deficiency   [facility] Scott, [place], MD   4 months ago Acute left ankle pain   [facility], Nino Glow, MD   5 months ago Acute cystitis without hematuria   [facility] [place], Randell Patient, MD   8 months ago Sinusitis, unspecified chronicity, unspecified location   [facility], Randell Patient, MD   12 months ago Abnormal liver function test   [facility], MD      Future Appointments            In 3 months O'Brien-Blaney, Bryson Corona, LPN [place], [place]   In 3 months Einar Pheasant, MD [facility], [place]

## 2019-04-22 ENCOUNTER — Encounter: Payer: Self-pay | Admitting: Internal Medicine

## 2019-04-22 DIAGNOSIS — E2839 Other primary ovarian failure: Secondary | ICD-10-CM | POA: Insufficient documentation

## 2019-04-22 NOTE — Assessment & Plan Note (Signed)
Follow liver panel.  

## 2019-04-22 NOTE — Assessment & Plan Note (Signed)
Followed by Dr Bary Castilla.  Last mammogram 09/11/18 III.  Recommended f/u in 12 months.

## 2019-04-22 NOTE — Assessment & Plan Note (Signed)
Low carb diet and exercise.  Follow met b and a1c.  

## 2019-04-22 NOTE — Assessment & Plan Note (Signed)
Controlled.  

## 2019-04-22 NOTE — Assessment & Plan Note (Signed)
Intolerant to multiple statin medications.  Have discussed other treatment options. A gain discussed with her today. Discussed zetia. She will notify me if agreeable.  Follow lipid panel.  Low cholesterol diet and exercise.

## 2019-04-22 NOTE — Assessment & Plan Note (Signed)
Schedule bone density.    

## 2019-04-22 NOTE — Assessment & Plan Note (Signed)
Blood pressure under good control.  Continue same medication regimen.  Follow pressures.  Follow metabolic panel.   

## 2019-04-22 NOTE — Assessment & Plan Note (Addendum)
Follow cbc. Will change her iron to integra.  (change per her request).  Check iron stores.

## 2019-05-02 ENCOUNTER — Ambulatory Visit
Admission: RE | Admit: 2019-05-02 | Discharge: 2019-05-02 | Disposition: A | Discharge status: Home / Self Care | Payer: Medicare Other | Source: Ambulatory Visit | Attending: Internal Medicine | Admitting: Internal Medicine

## 2019-05-02 ENCOUNTER — Other Ambulatory Visit: Payer: Self-pay

## 2019-05-02 DIAGNOSIS — E2839 Other primary ovarian failure: Secondary | ICD-10-CM | POA: Diagnosis present
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
## 2019-05-04 ENCOUNTER — Other Ambulatory Visit: Payer: Self-pay

## 2019-05-04 ENCOUNTER — Encounter: Payer: Self-pay | Admitting: Internal Medicine

## 2019-05-05 MED ORDER — EZETIMIBE 10 MG PO TABS: 10.0000 mg | ORAL_TABLET | Freq: Every day | ORAL | 2 refills | Status: DC

## 2019-05-05 MED ORDER — FLUTICASONE PROPIONATE 50 MCG/ACT NA SUSP: 2.0000 | NASAL | 3 refills | Status: DC | PRN

## 2019-05-05 NOTE — Telephone Encounter (Signed)
rx sent in for zetia and flonase as requested.

## 2019-05-21 ENCOUNTER — Other Ambulatory Visit: Payer: Self-pay

## 2019-05-21 ENCOUNTER — Ambulatory Visit (INDEPENDENT_AMBULATORY_CARE_PROVIDER_SITE_OTHER): Payer: Medicare Other | Admitting: Internal Medicine

## 2019-05-21 DIAGNOSIS — M79642 Pain in left hand: Secondary | ICD-10-CM

## 2019-05-21 DIAGNOSIS — R945 Abnormal results of liver function studies: Secondary | ICD-10-CM | POA: Diagnosis not present

## 2019-05-21 DIAGNOSIS — E7800 Pure hypercholesterolemia, unspecified: Secondary | ICD-10-CM

## 2019-05-21 DIAGNOSIS — D0512 Intraductal carcinoma in situ of left breast: Secondary | ICD-10-CM

## 2019-05-21 DIAGNOSIS — M8580 Other specified disorders of bone density and structure, unspecified site: Secondary | ICD-10-CM

## 2019-05-21 DIAGNOSIS — Z9109 Other allergy status, other than to drugs and biological substances: Secondary | ICD-10-CM

## 2019-05-21 DIAGNOSIS — I10 Essential (primary) hypertension: Secondary | ICD-10-CM

## 2019-05-21 DIAGNOSIS — R7989 Other specified abnormal findings of blood chemistry: Secondary | ICD-10-CM

## 2019-05-21 DIAGNOSIS — R739 Hyperglycemia, unspecified: Secondary | ICD-10-CM

## 2019-05-21 DIAGNOSIS — D649 Anemia, unspecified: Secondary | ICD-10-CM | POA: Diagnosis not present

## 2019-05-21 NOTE — Progress Notes (Addendum)
Patient ID: Helen Marshall, female   DOB: 1946/03/04, 73 y.o.   MRN: [id number]   Virtual Visit via video Note  This visit type was conducted due to national recommendations for restrictions regarding the COVID-19 pandemic (e.g. social distancing).  This format is felt to be most appropriate for this patient at this time.  All issues noted in this document were discussed and addressed.  No physical exam was performed (except for noted visual exam findings with Video Visits).   I connected with Helen Marshall by a video enabled telemedicine application and verified that I am speaking with the correct person using two identifiers. Location patient: home Location provider: work Persons participating in the virtual visit: patient, provider  I discussed the limitations, risks, security and privacy concerns of performing an evaluation and management service by video and the availability of in person appointments.  The patient expressed understanding and agreed to proceed.  Interactive audio and video telecommunications were attempted between this provider and patient and initially successful.  We were able to complete approximately 50% of the visit with audio and video, but due to difficulty hearing, we had to convert to a telephone visit.  We continued and completed visit with audio only.   Reason for visit: scheduled follow up.   HPI: She reports she is doing well.  Trying to stay active.  No chest pain. No chest pain.  No sob.  No acid reflux.  No abdominal pain.  Bowels stable.  Discussed bone density results and treatment options.  Given her GI issues, hold on oral bisphosphonates.  Discussed reclast. She is in agreement.     ROS: See pertinent positives and negatives per HPI.  Past Medical History:  Diagnosis Date  . Abnormal LFTs   . Abnormal mammogram 12/19/2012   left  . Allergy   . Anemia   . Cancer [facility]) 2014   left Breast  . Carotid bruit    left  . Cataract   . Colon polyps    . Diverticulosis   . Diverticulosis 2012  . Environmental allergies   . GERD (gastroesophageal reflux disease)   . Heart murmur   . Hypercholesterolemia   . Hyperglycemia   . Hypertension   . IBS (irritable bowel syndrome)   . Lipoma of colon   . Lumbar disc disease   . 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  . Tubular adenoma of colon     Past Surgical History:  Procedure Laterality Date  . ABDOMINAL HYSTERECTOMY  1989   fibroids  . ANKLE SURGERY Left 1999  . BREAST SURGERY Left 1970   biopsy  . CATARACT EXTRACTION, BILATERAL  June & July 2017  . CHOLECYSTECTOMY  2001  . COLONOSCOPY  06/02/2011   Verdie Shire, MD; diverticulosis, submucosal lipoma of the sigmoid colon.  Marland Kitchen HERNIA REPAIR  2001, 2004  . HERNIA REPAIR  01/22/2013   Repeat repair of umbilical defect, 2.5 cm. Primary repair.  Marland Kitchen NASAL SINUS SURGERY  1997  . rotator cuff surgery  1998  . SQUAMOUS CELL CARCINOMA EXCISION Right 05/2013   shoulder  . TUBAL LIGATION  1976  . UPPER GI ENDOSCOPY  08/22/14   Dr Billie Lade    Family History  Problem Relation Age of Onset  . CVA Father   . Alcohol  abuse Father   . Heart disease Mother        myocardial infarction  . Diabetes Mother   . Hearing loss Mother   . Hypertension Brother        x2  . Arthritis Brother   . Hearing loss Brother   . Alcohol abuse Brother   . Arthritis Brother   . Hearing loss Brother   . Arthritis/Rheumatoid Sister        x2  . Breast cancer Sister 8  . Arthritis Sister   . Hearing loss Sister   . Arthritis Sister   . Hearing loss Sister   . Asthma Sister   . Arthritis Sister   . Hearing loss Sister   . Hypothyroidism Sister   . Arthritis Sister   . Hearing loss Sister   . Cancer Other        breast - paternal first  cousin  . Colon cancer Maternal Uncle   . Colon polyps Son   . Colon polyps Sister   . Arthritis Sister   . Hearing loss Sister     SOCIAL HX: reviewed.    Current Outpatient Medications:  .  albuterol (PROVENTIL HFA;VENTOLIN HFA) 108 (90 Base) MCG/ACT inhaler, Inhale 2 puffs into the lungs every 6 (six) hours as needed for wheezing or shortness of breath. Needs pro air, Disp: 3 Inhaler, Rfl: 1 .  amLODipine (NORVASC) 5 MG tablet, Take 1 tablet (5 mg total) by mouth daily., Disp: 90 tablet, Rfl: 3 .  benazepril (LOTENSIN) 40 MG tablet, Take 1 tablet (40 mg total) by mouth daily., Disp: 90 tablet, Rfl: 3 .  calcium-vitamin D (CALCIUM 500+D) 500-400 MG-UNIT per tablet, Take 1 tablet by mouth daily. , Disp: , Rfl:  .  ezetimibe (ZETIA) 10 MG tablet, Take 1 tablet (10 mg total) by mouth daily., Disp: 30 tablet, Rfl: 2 .  Fe Fum-FePoly-Vit C-Vit B3 (INTEGRA) 62.5-62.5-40-3 MG CAPS, Take one capsule per day, Disp: 30 capsule, Rfl: 2 .  fish oil-omega-3 fatty acids 1000 MG capsule, 5 (five) times daily. , Disp: , Rfl:  .  fluticasone (FLONASE) 50 MCG/ACT nasal spray, Place 2 sprays into both nostrils as needed., Disp: 48 g, Rfl: 3 .  hydrochlorothiazide (HYDRODIURIL) 25 MG tablet, Take 1 tablet (25 mg total) by mouth daily., Disp: 90 tablet, Rfl: 3 .  Multiple Vitamin (MULTIVITAMIN) tablet, Take 1 tablet by mouth daily., Disp: , Rfl:  .  niacin (NIASPAN) 1000 MG CR tablet, Take 1,000 mg by mouth 2 (two) times daily., Disp: , Rfl:  .  potassium chloride (K-DUR) 10 MEQ tablet, TAKE 1 TABLET DAILY, Disp: 90 tablet, Rfl: 3 .  Probiotic Product (VSL#3 PO), Take by mouth. 1 capsul daily, Disp: , Rfl:  .  triamcinolone cream (KENALOG) 0.1 %, Apply 1 application topically as needed. , Disp: , Rfl:   EXAM:  GENERAL: alert, oriented, appears well and in no acute distress  HEENT: atraumatic, conjunttiva clear, no obvious abnormalities on inspection of external nose and ears  NECK: normal movements of  the head and neck  LUNGS: on inspection no signs of respiratory distress, breathing rate appears normal, no obvious gross SOB, gasping or wheezing  CV: no obvious cyanosis  PSYCH/NEURO: pleasant and cooperative, no obvious depression or anxiety, speech and thought processing grossly intact  ASSESSMENT AND PLAN:  Discussed the following assessment and plan:  Abnormal liver function test Follow liver panel.  Diet and exercise.    Anemia Follow cbc.  Environmental allergies Controlled.   Hypercholesterolemia Intolerant to statin medication  On zetia.  Low cholesterol diet and exercise.  Follow lipid panel.   Hyperglycemia Low carb diet and exercise.  Follow met b and a1c.   Hypertension Blood pressure under good control.  Continue same medication regimen.  Follow pressures.  Follow metabolic panel.    Neoplasm of left breast, primary tumor 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) Followed by Dr Bary Castilla.  Last mammogram 09/11/18- III.  Recommended f/u in 12 months.    Osteopenia Discussed bone density results and fracture risk.  Discussed treatment options.  Given her GI issues, elected to start reclast.  Will check vitamin D, urine and labs needed with next lab appt.    Hand pain, left Described "knots" on her left hand.  On questioning, it appears to be localized over the tendon - left hand.  Persistent.  Some discomfort.  Will have ortho evaluate.      I discussed the assessment and treatment plan with the patient. The patient was provided an opportunity to ask questions and all were answered. The patient agreed with the plan and demonstrated an understanding of the instructions.   The patient was advised to call back or seek an in-person evaluation if the symptoms worsen or if the condition fails to improve as anticipated.  I provided 15 minutes of non-face-to-face time during this encounter.   Einar Pheasant, MD

## 2019-05-27 ENCOUNTER — Encounter: Payer: Self-pay | Admitting: Internal Medicine

## 2019-05-27 DIAGNOSIS — M8580 Other specified disorders of bone density and structure, unspecified site: Secondary | ICD-10-CM | POA: Insufficient documentation

## 2019-05-27 NOTE — Assessment & Plan Note (Signed)
Followed by Dr Bary Castilla.  Last mammogram 09/11/18- III.  Recommended f/u in 12 months.

## 2019-05-27 NOTE — Assessment & Plan Note (Signed)
Discussed bone density results and fracture risk.  Discussed treatment options.  Given her GI issues, elected to start reclast.  Will check vitamin D, urine and labs needed with next lab appt.

## 2019-05-27 NOTE — Assessment & Plan Note (Signed)
Low carb diet and exercise.  Follow met b and a1c.  

## 2019-05-27 NOTE — Assessment & Plan Note (Signed)
Controlled.  

## 2019-05-27 NOTE — Assessment & Plan Note (Signed)
Blood pressure under good control.  Continue same medication regimen.  Follow pressures.  Follow metabolic panel.   

## 2019-05-27 NOTE — Assessment & Plan Note (Signed)
Follow cbc.  

## 2019-05-27 NOTE — Assessment & Plan Note (Signed)
Intolerant to statin medication. On zetia.  Low cholesterol diet and exercise.  Follow lipid panel.  

## 2019-05-27 NOTE — Assessment & Plan Note (Signed)
Follow liver panel.  Diet and exercise.   

## 2019-05-29 ENCOUNTER — Other Ambulatory Visit: Payer: Self-pay

## 2019-05-29 ENCOUNTER — Other Ambulatory Visit (INDEPENDENT_AMBULATORY_CARE_PROVIDER_SITE_OTHER): Payer: Medicare Other

## 2019-05-29 DIAGNOSIS — R945 Abnormal results of liver function studies: Secondary | ICD-10-CM

## 2019-05-29 DIAGNOSIS — E611 Iron deficiency: Secondary | ICD-10-CM | POA: Diagnosis not present

## 2019-05-29 DIAGNOSIS — R739 Hyperglycemia, unspecified: Secondary | ICD-10-CM | POA: Diagnosis not present

## 2019-05-29 DIAGNOSIS — E7800 Pure hypercholesterolemia, unspecified: Secondary | ICD-10-CM

## 2019-05-29 DIAGNOSIS — I10 Essential (primary) hypertension: Secondary | ICD-10-CM

## 2019-05-29 DIAGNOSIS — M8580 Other specified disorders of bone density and structure, unspecified site: Secondary | ICD-10-CM

## 2019-05-29 DIAGNOSIS — R7989 Other specified abnormal findings of blood chemistry: Secondary | ICD-10-CM

## 2019-05-29 LAB — HEPATIC FUNCTION PANEL
ALT: 21 U/L (ref 0–35)
AST: 24 U/L (ref 0–37)
Albumin: 4.1 g/dL (ref 3.5–5.2)
Alkaline Phosphatase: 79 U/L (ref 39–117)
Bilirubin, Direct: 0.1 mg/dL (ref 0.0–0.3)
Total Bilirubin: 0.7 mg/dL (ref 0.2–1.2)
Total Protein: 6.5 g/dL (ref 6.0–8.3)

## 2019-05-29 LAB — CBC WITH DIFFERENTIAL/PLATELET
Basophils Absolute: 0.1 10*3/uL (ref 0.0–0.1)
Basophils Relative: 1.1 % (ref 0.0–3.0)
Eosinophils Absolute: 0.3 10*3/uL (ref 0.0–0.7)
Eosinophils Relative: 4.4 % (ref 0.0–5.0)
HCT: 39.2 % (ref 36.0–46.0)
Hemoglobin: 13.2 g/dL (ref 12.0–15.0)
Lymphocytes Relative: 26.8 % (ref 12.0–46.0)
Lymphs Abs: 1.8 10*3/uL (ref 0.7–4.0)
MCHC: 33.8 g/dL (ref 30.0–36.0)
MCV: 91.3 fl (ref 78.0–100.0)
Monocytes Absolute: 0.8 10*3/uL (ref 0.1–1.0)
Monocytes Relative: 12.5 % — ABNORMAL HIGH (ref 3.0–12.0)
Neutro Abs: 3.7 10*3/uL (ref 1.4–7.7)
Neutrophils Relative %: 55.2 % (ref 43.0–77.0)
Platelets: 239 10*3/uL (ref 150.0–400.0)
RBC: 4.29 Mil/uL (ref 3.87–5.11)
RDW: 14.3 % (ref 11.5–15.5)
WBC: 6.8 10*3/uL (ref 4.0–10.5)

## 2019-05-29 LAB — BASIC METABOLIC PANEL
BUN: 15 mg/dL (ref 6–23)
CO2: 31 mEq/L (ref 19–32)
Calcium: 9.8 mg/dL (ref 8.4–10.5)
Chloride: 101 mEq/L (ref 96–112)
Creatinine, Ser: 0.76 mg/dL (ref 0.40–1.20)
GFR: 74.43 mL/min (ref >60.00)
Glucose, Bld: 96 mg/dL (ref 70–99)
Potassium: 3.9 mEq/L (ref 3.5–5.1)
Sodium: 138 mEq/L (ref 135–145)

## 2019-05-29 LAB — URINALYSIS, ROUTINE W REFLEX MICROSCOPIC
Bilirubin Urine: NEGATIVE
Hgb urine dipstick: NEGATIVE
Ketones, ur: NEGATIVE
Nitrite: NEGATIVE
Specific Gravity, Urine: 1.015 (ref 1.000–1.030)
Total Protein, Urine: NEGATIVE
Urine Glucose: NEGATIVE
Urobilinogen, UA: 0.2 (ref 0.0–1.0)
pH: 7 (ref 5.0–8.0)

## 2019-05-29 LAB — LIPID PANEL
Cholesterol: 195 mg/dL (ref 0–200)
HDL: 78.9 mg/dL (ref >39.00)
LDL Cholesterol: 100 mg/dL — ABNORMAL HIGH (ref 0–99)
NonHDL: 116
Total CHOL/HDL Ratio: 2
Triglycerides: 81 mg/dL (ref 0.0–149.0)
VLDL: 16.2 mg/dL (ref 0.0–40.0)

## 2019-05-29 LAB — HEMOGLOBIN A1C: Hgb A1c MFr Bld: 5.8 % (ref 4.6–6.5)

## 2019-05-31 LAB — VITAMIN D 25 HYDROXY (VIT D DEFICIENCY, FRACTURES): VITD: 51.4 ng/mL (ref 30.00–100.00)

## 2019-05-31 LAB — FERRITIN: Ferritin: 36.4 ng/mL (ref 10.0–291.0)

## 2019-06-04 ENCOUNTER — Encounter: Payer: Self-pay | Admitting: Internal Medicine

## 2019-06-06 DIAGNOSIS — M79642 Pain in left hand: Secondary | ICD-10-CM | POA: Insufficient documentation

## 2019-06-06 NOTE — Telephone Encounter (Signed)
Order placed for ortho referral.   

## 2019-06-06 NOTE — Addendum Note (Signed)
Addended by: Alisa Graff on: 06/06/2019 01:03 PM   Modules accepted: Orders

## 2019-06-06 NOTE — Assessment & Plan Note (Signed)
Described "knots" on her left hand.  On questioning, it appears to be localized over the tendon - left hand.  Persistent.  Some discomfort.  Will have ortho evaluate.

## 2019-06-08 ENCOUNTER — Other Ambulatory Visit: Payer: Self-pay | Admitting: Internal Medicine

## 2019-06-08 DIAGNOSIS — D0512 Intraductal carcinoma in situ of left breast: Secondary | ICD-10-CM

## 2019-06-08 NOTE — Progress Notes (Signed)
Orders placed for referral to Dr Bary Castilla

## 2019-07-12 ENCOUNTER — Other Ambulatory Visit: Payer: Self-pay | Admitting: Internal Medicine

## 2019-08-01 ENCOUNTER — Other Ambulatory Visit: Payer: Self-pay | Admitting: Internal Medicine

## 2019-08-13 ENCOUNTER — Ambulatory Visit: Payer: Medicare Other

## 2019-08-13 ENCOUNTER — Ambulatory Visit: Payer: Medicare Other | Admitting: Internal Medicine

## 2019-08-14 ENCOUNTER — Other Ambulatory Visit: Payer: Self-pay | Admitting: Internal Medicine

## 2019-08-29 ENCOUNTER — Other Ambulatory Visit: Payer: Self-pay

## 2019-08-29 ENCOUNTER — Ambulatory Visit (INDEPENDENT_AMBULATORY_CARE_PROVIDER_SITE_OTHER): Payer: Medicare Other | Admitting: Internal Medicine

## 2019-08-29 DIAGNOSIS — R945 Abnormal results of liver function studies: Secondary | ICD-10-CM

## 2019-08-29 DIAGNOSIS — Z9109 Other allergy status, other than to drugs and biological substances: Secondary | ICD-10-CM | POA: Diagnosis not present

## 2019-08-29 DIAGNOSIS — R739 Hyperglycemia, unspecified: Secondary | ICD-10-CM

## 2019-08-29 DIAGNOSIS — D649 Anemia, unspecified: Secondary | ICD-10-CM | POA: Diagnosis not present

## 2019-08-29 DIAGNOSIS — M79642 Pain in left hand: Secondary | ICD-10-CM | POA: Diagnosis not present

## 2019-08-29 DIAGNOSIS — I10 Essential (primary) hypertension: Secondary | ICD-10-CM

## 2019-08-29 DIAGNOSIS — E7800 Pure hypercholesterolemia, unspecified: Secondary | ICD-10-CM

## 2019-08-29 DIAGNOSIS — R7989 Other specified abnormal findings of blood chemistry: Secondary | ICD-10-CM

## 2019-08-29 DIAGNOSIS — M8580 Other specified disorders of bone density and structure, unspecified site: Secondary | ICD-10-CM

## 2019-08-29 NOTE — Progress Notes (Signed)
Patient ID: Helen Marshall, female   DOB: 26-May-1946, 74 y.o.   MRN: [id number]   Virtual Visit via telephone Note  This visit type was conducted due to national recommendations for restrictions regarding the COVID-19 pandemic (e.g. social distancing).  This format is felt to be most appropriate for this patient at this time.  All issues noted in this document were discussed and addressed.  No physical exam was performed (except for noted visual exam findings with Video Visits).   I connected with Helen Marshall by telephone and verified that I am speaking with the correct person using two identifiers. Location patient: home Location provider: work  Persons participating in the telephone visit: patient, provider  The limitations, risks, security and privacy concerns of performing an evaluation and management service by telephone and the availability of in person appointments have been discussed. The patient expressed understanding and agreed to proceed.   Reason for visit: scheduled follow up.   HPI: She reports she is doing relatively well.  Blood pressure appear to be doing ok.  Stays active.  No chest pain or sob.  No abdominal pain.  Overall feels her bowels are stable.  Takes sinus pill/mucinex prn.  This controls allergy symptoms.  No chest congestion or cough.  Scheduled for f/u mammogram 09/17/19.  Has f/u with Dr Bary Castilla 09/25/19.  Still has the "knots" on her hand.  Saw dermatology.  Cancelled ortho.  No pain.  Some low back pain if she stands for a long period of time.  Discussed exercises for arthritis.  Is agreeable for reclast.  Previously discussed. Blood pressures doing well - 111/54 - 134/67.  Has received first covid vaccine.     ROS: See pertinent positives and negatives per HPI.  Past Medical History:  Diagnosis Date  . Abnormal LFTs   . Abnormal mammogram 12/19/2012   left  . Allergy   . Anemia   . Cancer [facility]) 2014   left Breast  . Carotid bruit    left  . Cataract     . Colon polyps   . Diverticulosis   . Diverticulosis 2012  . Environmental allergies   . GERD (gastroesophageal reflux disease)   . Heart murmur   . Hypercholesterolemia   . Hyperglycemia   . Hypertension   . IBS (irritable bowel syndrome)   . Lipoma of colon   . Lumbar disc disease   . 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  . Tubular adenoma of colon     Past Surgical History:  Procedure Laterality Date  . ABDOMINAL HYSTERECTOMY  1989   fibroids  . ANKLE SURGERY Left 1999  . BREAST SURGERY Left 1970   biopsy  . CATARACT EXTRACTION, BILATERAL  June & July 2017  . CHOLECYSTECTOMY  2001  . COLONOSCOPY  06/02/2011   Verdie Shire, MD; diverticulosis, submucosal lipoma of the sigmoid colon.  Marland Kitchen HERNIA REPAIR  2001, 2004  . HERNIA REPAIR  01/22/2013   Repeat repair of umbilical defect, 2.5 cm. Primary repair.  Marland Kitchen NASAL SINUS SURGERY  1997  . rotator cuff surgery  1998  . SQUAMOUS CELL CARCINOMA EXCISION Right 05/2013   shoulder  . TUBAL LIGATION  1976  . UPPER GI ENDOSCOPY  08/22/14   Dr Billie Lade    Family History  Problem Relation  Age of Onset  . CVA Father   . Alcohol abuse Father   . Heart disease Mother        myocardial infarction  . Diabetes Mother   . Hearing loss Mother   . Hypertension Brother        x2  . Arthritis Brother   . Hearing loss Brother   . Alcohol abuse Brother   . Arthritis Brother   . Hearing loss Brother   . Arthritis/Rheumatoid Sister        x2  . Breast cancer Sister 39  . Arthritis Sister   . Hearing loss Sister   . Arthritis Sister   . Hearing loss Sister   . Asthma Sister   . Arthritis Sister   . Hearing loss Sister   . Hypothyroidism Sister   . Arthritis Sister   . Hearing loss Sister   . Cancer Other        breast -  paternal first cousin  . Colon cancer Maternal Uncle   . Colon polyps Son   . Colon polyps Sister   . Arthritis Sister   . Hearing loss Sister     SOCIAL HX: reviewed.    Current Outpatient Medications:  .  albuterol (VENTOLIN HFA) 108 (90 Base) MCG/ACT inhaler, USE 2 INHALATIONS EVERY 6 HOURS AS NEEDED FOR WHEEZING OR SHORTNESS OF BREATH, Disp: 25.5 g, Rfl: 2 .  amLODipine (NORVASC) 5 MG tablet, Take 1 tablet (5 mg total) by mouth daily., Disp: 90 tablet, Rfl: 3 .  benazepril (LOTENSIN) 40 MG tablet, Take 1 tablet (40 mg total) by mouth daily., Disp: 90 tablet, Rfl: 3 .  calcium-vitamin D (CALCIUM 500+D) 500-400 MG-UNIT per tablet, Take 1 tablet by mouth daily. , Disp: , Rfl:  .  ezetimibe (ZETIA) 10 MG tablet, TAKE 1 TABLET BY MOUTH EVERY DAY, Disp: 90 tablet, Rfl: 1 .  Fe Fum-FePoly-Vit C-Vit B3 (INTEGRA) 62.5-62.5-40-3 MG CAPS, TAKE 1 CAPSULE BY MOUTH EVERY DAY, Disp: 90 capsule, Rfl: 0 .  fish oil-omega-3 fatty acids 1000 MG capsule, 5 (five) times daily. , Disp: , Rfl:  .  fluticasone (FLONASE) 50 MCG/ACT nasal spray, Place 2 sprays into both nostrils as needed., Disp: 48 g, Rfl: 3 .  hydrochlorothiazide (HYDRODIURIL) 25 MG tablet, Take 1 tablet (25 mg total) by mouth daily., Disp: 90 tablet, Rfl: 3 .  Multiple Vitamin (MULTIVITAMIN) tablet, Take 1 tablet by mouth daily., Disp: , Rfl:  .  niacin (NIASPAN) 1000 MG CR tablet, Take 1,000 mg by mouth 2 (two) times daily., Disp: , Rfl:  .  potassium chloride (K-DUR) 10 MEQ tablet, TAKE 1 TABLET DAILY, Disp: 90 tablet, Rfl: 3 .  Probiotic Product (VSL#3 PO), Take by mouth. 1 capsul daily, Disp: , Rfl:  .  triamcinolone cream (KENALOG) 0.1 %, Apply 1 application topically as needed. , Disp: , Rfl:   EXAM:  VITALS per patient if applicable: 297/98  GENERAL: alert.  Sounds to be in no acute distress.  Answering questions appropriately.    PSYCH/NEURO: pleasant and cooperative, no obvious depression or anxiety, speech and thought  processing grossly intact  ASSESSMENT AND PLAN:  Discussed the following assessment and plan:  Abnormal liver function test Diet and exercise.  Follow liver panel.   Anemia Follow cbc.   Environmental allergies Stable.  Controlled.    Hand pain, left No pain.  "knots" as outlined.  Evaluated by dermatology.  Wants to follow.   Hypercholesterolemia Intolerant to statin medication.  On zetia  and niaspan.  Low cholesterol diet and exercise.  Follow lipid panel.   Hyperglycemia Low carb diet and exercise.  Follow met b and a1c   Hypertension Blood pressure under good control.  Continue same medication regimen.  Follow pressures.  Follow metabolic panel.    Osteopenia Given 10 year risk of major osteoporotic fracture is >20%, will refer to endocrinology to discuss reclast treatment.  Pt in agreement.     Orders Placed This Encounter  Procedures  . Hemoglobin A1c    Standing Status:   Future    Standing Expiration Date:   09/01/2020  . Hepatic function panel    Standing Status:   Future    Standing Expiration Date:   09/01/2020  . Lipid panel    Standing Status:   Future    Standing Expiration Date:   09/01/2020  . Basic metabolic panel    Standing Status:   Future    Standing Expiration Date:   09/01/2020  . Ambulatory referral to Endocrinology    Referral Priority:   Routine    Referral Type:   Consultation    Referral Reason:   Specialty Services Required    Number of Visits Requested:   1     I discussed the assessment and treatment plan with the patient. The patient was provided an opportunity to ask questions and all were answered. The patient agreed with the plan and demonstrated an understanding of the instructions.   The patient was advised to call back or seek an in-person evaluation if the symptoms worsen or if the condition fails to improve as anticipated.  I provided 21 minutes of non-face-to-face time during this encounter.   Einar Pheasant, MD

## 2019-09-02 ENCOUNTER — Encounter: Payer: Self-pay | Admitting: Internal Medicine

## 2019-09-02 NOTE — Assessment & Plan Note (Signed)
Stable. Controlled. 

## 2019-09-02 NOTE — Assessment & Plan Note (Signed)
Given 10 year risk of major osteoporotic fracture is >20%, will refer to endocrinology to discuss reclast treatment.  Pt in agreement.

## 2019-09-02 NOTE — Assessment & Plan Note (Signed)
Diet and exercise.  Follow liver panel.   

## 2019-09-02 NOTE — Assessment & Plan Note (Signed)
Blood pressure under good control.  Continue same medication regimen.  Follow pressures.  Follow metabolic panel.   

## 2019-09-02 NOTE — Assessment & Plan Note (Signed)
Intolerant to statin medication.  On zetia and niaspan.  Low cholesterol diet and exercise.  Follow lipid panel.

## 2019-09-02 NOTE — Assessment & Plan Note (Signed)
No pain.  "knots" as outlined.  Evaluated by dermatology.  Wants to follow.

## 2019-09-02 NOTE — Assessment & Plan Note (Signed)
Follow cbc.  

## 2019-09-02 NOTE — Assessment & Plan Note (Signed)
Low carb diet and exercise.  Follow met b and a1c

## 2019-09-04 ENCOUNTER — Other Ambulatory Visit: Payer: Self-pay | Admitting: Lab

## 2019-09-04 ENCOUNTER — Telehealth: Payer: Self-pay | Admitting: Internal Medicine

## 2019-09-04 MED ORDER — BENAZEPRIL HCL 40 MG PO TABS: 40.0000 mg | ORAL_TABLET | Freq: Every day | ORAL | 3 refills | Status: DC

## 2019-09-04 MED ORDER — POTASSIUM CHLORIDE CRYS ER 10 MEQ PO TBCR: 10.0000 meq | EXTENDED_RELEASE_TABLET | Freq: Every day | ORAL | 3 refills | Status: DC

## 2019-09-04 MED ORDER — HYDROCHLOROTHIAZIDE 25 MG PO TABS: 25.0000 mg | ORAL_TABLET | Freq: Every day | ORAL | 3 refills | Status: DC

## 2019-09-04 MED ORDER — AMLODIPINE BESYLATE 5 MG PO TABS: 5.0000 mg | ORAL_TABLET | Freq: Every day | ORAL | 3 refills | Status: DC

## 2019-09-04 NOTE — Telephone Encounter (Signed)
Refill sent in to pharmacy 

## 2019-09-04 NOTE — Telephone Encounter (Signed)
Pt called about needing a refill for amLODipine (NORVASC) 5 MG tablet, benazepril (LOTENSIN) 40 MG tablet, potassium chloride (K-DUR) 10 MEQ tablet and hydrochlorothiazide (HYDRODIURIL) 25 MG tablet.  Pharmacy is [id number] - [place], [address]  Call pt @ [phone]

## 2019-11-28 ENCOUNTER — Other Ambulatory Visit: Payer: Self-pay

## 2019-11-28 ENCOUNTER — Other Ambulatory Visit (INDEPENDENT_AMBULATORY_CARE_PROVIDER_SITE_OTHER): Payer: Medicare Other

## 2019-11-28 DIAGNOSIS — E7800 Pure hypercholesterolemia, unspecified: Secondary | ICD-10-CM | POA: Diagnosis not present

## 2019-11-28 DIAGNOSIS — R739 Hyperglycemia, unspecified: Secondary | ICD-10-CM | POA: Diagnosis not present

## 2019-11-28 DIAGNOSIS — I10 Essential (primary) hypertension: Secondary | ICD-10-CM

## 2019-11-28 LAB — HEMOGLOBIN A1C: Hgb A1c MFr Bld: 5.9 % (ref 4.6–6.5)

## 2019-11-28 LAB — HEPATIC FUNCTION PANEL
ALT: 21 U/L (ref 0–35)
AST: 25 U/L (ref 0–37)
Albumin: 4.1 g/dL (ref 3.5–5.2)
Alkaline Phosphatase: 71 U/L (ref 39–117)
Bilirubin, Direct: 0.1 mg/dL (ref 0.0–0.3)
Total Bilirubin: 0.6 mg/dL (ref 0.2–1.2)
Total Protein: 6.9 g/dL (ref 6.0–8.3)

## 2019-11-28 LAB — LIPID PANEL
Cholesterol: 210 mg/dL — ABNORMAL HIGH (ref 0–200)
HDL: 68.2 mg/dL (ref >39.00)
LDL Cholesterol: 121 mg/dL — ABNORMAL HIGH (ref 0–99)
NonHDL: 141.47
Total CHOL/HDL Ratio: 3
Triglycerides: 101 mg/dL (ref 0.0–149.0)
VLDL: 20.2 mg/dL (ref 0.0–40.0)

## 2019-11-28 LAB — BASIC METABOLIC PANEL
BUN: 13 mg/dL (ref 6–23)
CO2: 29 mEq/L (ref 19–32)
Calcium: 9.1 mg/dL (ref 8.4–10.5)
Chloride: 98 mEq/L (ref 96–112)
Creatinine, Ser: 0.71 mg/dL (ref 0.40–1.20)
GFR: 80.4 mL/min (ref >60.00)
Glucose, Bld: 98 mg/dL (ref 70–99)
Potassium: 3.8 mEq/L (ref 3.5–5.1)
Sodium: 135 mEq/L (ref 135–145)

## 2019-11-29 ENCOUNTER — Other Ambulatory Visit: Payer: Self-pay

## 2019-11-29 ENCOUNTER — Encounter: Payer: Self-pay | Admitting: Internal Medicine

## 2019-11-29 ENCOUNTER — Ambulatory Visit: Payer: Medicare Other | Admitting: Internal Medicine

## 2019-11-29 DIAGNOSIS — I10 Essential (primary) hypertension: Secondary | ICD-10-CM | POA: Diagnosis not present

## 2019-11-29 DIAGNOSIS — R945 Abnormal results of liver function studies: Secondary | ICD-10-CM

## 2019-11-29 DIAGNOSIS — E7800 Pure hypercholesterolemia, unspecified: Secondary | ICD-10-CM

## 2019-11-29 DIAGNOSIS — D0512 Intraductal carcinoma in situ of left breast: Secondary | ICD-10-CM | POA: Diagnosis not present

## 2019-11-29 DIAGNOSIS — R7989 Other specified abnormal findings of blood chemistry: Secondary | ICD-10-CM

## 2019-11-29 DIAGNOSIS — M8580 Other specified disorders of bone density and structure, unspecified site: Secondary | ICD-10-CM

## 2019-11-29 DIAGNOSIS — R739 Hyperglycemia, unspecified: Secondary | ICD-10-CM | POA: Diagnosis not present

## 2019-11-29 DIAGNOSIS — D649 Anemia, unspecified: Secondary | ICD-10-CM

## 2019-11-29 NOTE — Progress Notes (Signed)
Patient ID: Helen Marshall, female   DOB: [birth year], 74 y.o.   MRN: [id number]   Subjective:    Patient ID: Helen Marshall, female    DOB: 1945-10-12, 74 y.o.   MRN: [id number]  HPI This visit occurred during the SARS-CoV-2 public health emergency.  Safety protocols were in place, including screening questions prior to the visit, additional usage of staff PPE, and extensive cleaning of exam room while observing appropriate contact time as indicated for disinfecting solutions.  Patient here for a scheduled follow up.  She reports increased stress recently.  Increased stress with her son's business and some family issues.  Discussed.  Overall she feels she is handling things relatively well.  Does not feel needs any further intervention.  Saw Dr Bary Castilla.  Had bilateral screening mammogram 09/17/19.  Recommended f/u in one year.  Tries to stay active.  No chest pain or sob with increased activity or exertion.  No abdominal pain.  Bowels moving.  Saw endocrinology.  Was started on fosamax. Did not tolerate.  Elects to remain off for now.  Does describe burning in her feet at night.  Desires no further testing.  Agreed to f/u labs.     Past Medical History:  Diagnosis Date  . Abnormal LFTs   . Abnormal mammogram 12/19/2012   left  . Allergy   . Anemia   . Cancer [facility]) 2014   left Breast  . Carotid bruit    left  . Cataract   . Colon polyps   . Diverticulosis   . Diverticulosis 2012  . Environmental allergies   . GERD (gastroesophageal reflux disease)   . Heart murmur   . Hypercholesterolemia   . Hyperglycemia   . Hypertension   . IBS (irritable bowel syndrome)   . Lipoma of colon   . Lumbar disc disease   . 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  . Tubular adenoma of colon      Past Surgical History:  Procedure Laterality Date  . ABDOMINAL HYSTERECTOMY  1989   fibroids  . ANKLE SURGERY Left 1999  . BREAST SURGERY Left 1970   biopsy  . CATARACT EXTRACTION, BILATERAL  June & July 2017  . CHOLECYSTECTOMY  2001  . COLONOSCOPY  06/02/2011   Verdie Shire, MD; diverticulosis, submucosal lipoma of the sigmoid colon.  Marland Kitchen HERNIA REPAIR  2001, 2004  . HERNIA REPAIR  01/22/2013   Repeat repair of umbilical defect, 2.5 cm. Primary repair.  Marland Kitchen NASAL SINUS SURGERY  1997  . rotator cuff surgery  1998  . SQUAMOUS CELL CARCINOMA EXCISION Right 05/2013   shoulder  . TUBAL LIGATION  1976  . UPPER GI ENDOSCOPY  08/22/14   Dr Billie Lade   Family History  Problem Relation Age of Onset  . CVA Father   . Alcohol abuse Father   . Heart disease Mother        myocardial infarction  . Diabetes Mother   . Hearing loss Mother   . Hypertension Brother        x2  . Arthritis Brother   . Hearing loss Brother   . Alcohol abuse Brother   . Arthritis Brother   . Hearing loss Brother   . Arthritis/Rheumatoid Sister        x2  . Breast  cancer Sister 37  . Arthritis Sister   . Hearing loss Sister   . Arthritis Sister   . Hearing loss Sister   . Asthma Sister   . Arthritis Sister   . Hearing loss Sister   . Hypothyroidism Sister   . Arthritis Sister   . Hearing loss Sister   . Cancer Other        breast - paternal first cousin  . Colon cancer Maternal Uncle   . Colon polyps Son   . Colon polyps Sister   . Arthritis Sister   . Hearing loss Sister    Social History   Socioeconomic History  . Marital status: Married    Spouse name: Not on file  . Number of children: 2  . Years of education: Not on file  . Highest education level: Not on file  Occupational History  . Occupation: Retired  Tobacco Use  . Smoking status: Never Smoker  . Smokeless tobacco: Never Used  Substance and Sexual Activity  . Alcohol use: No    Alcohol/week: 0.0 standard drinks  . Drug use: No   . Sexual activity: Never  Other Topics Concern  . Not on file  Social History Narrative  . Not on file   Social Determinants of Health   Financial Resource Strain:   . Difficulty of Paying Living Expenses:   Food Insecurity:   . Worried About Charity fundraiser in the Last Year:   . Arboriculturist in the Last Year:   Transportation Needs:   . Film/video editor (Medical):   Marland Kitchen Lack of Transportation (Non-Medical):   Physical Activity:   . Days of Exercise per Week:   . Minutes of Exercise per Session:   Stress:   . Feeling of Stress :   Social Connections:   . Frequency of Communication with Friends and Family:   . Frequency of Social Gatherings with Friends and Family:   . Attends Religious Services:   . Active Member of Clubs or Organizations:   . Attends Archivist Meetings:   Marland Kitchen Marital Status:     Outpatient Encounter Medications as of 11/29/2019  Medication Sig  . albuterol (VENTOLIN HFA) 108 (90 Base) MCG/ACT inhaler USE 2 INHALATIONS EVERY 6 HOURS AS NEEDED FOR WHEEZING OR SHORTNESS OF BREATH  . amLODipine (NORVASC) 5 MG tablet Take 1 tablet (5 mg total) by mouth daily.  . benazepril (LOTENSIN) 40 MG tablet Take 1 tablet (40 mg total) by mouth daily.  . calcium-vitamin D (CALCIUM 500+D) 500-400 MG-UNIT per tablet Take 1 tablet by mouth daily.   Marland Kitchen ezetimibe (ZETIA) 10 MG tablet TAKE 1 TABLET BY MOUTH EVERY DAY  . Fe Fum-FePoly-Vit C-Vit B3 (INTEGRA) 62.5-62.5-40-3 MG CAPS TAKE 1 CAPSULE BY MOUTH EVERY DAY  . fish oil-omega-3 fatty acids 1000 MG capsule 5 (five) times daily.   . fluticasone (FLONASE) 50 MCG/ACT nasal spray Place 2 sprays into both nostrils as needed.  . hydrochlorothiazide (HYDRODIURIL) 25 MG tablet Take 1 tablet (25 mg total) by mouth daily.  . Multiple Vitamin (MULTIVITAMIN) tablet Take 1 tablet by mouth daily.  . niacin (NIASPAN) 1000 MG CR tablet Take 1,000 mg by mouth 2 (two) times daily.  . potassium chloride (KLOR-CON) 10 MEQ  tablet Take 1 tablet (10 mEq total) by mouth daily.  . Probiotic Product (VSL#3 PO) Take by mouth. 1 capsul daily  . triamcinolone cream (KENALOG) 0.1 % Apply 1 application topically as needed.   . [  DISCONTINUED] alendronate (FOSAMAX) 70 MG tablet Take 1 tablet by mouth once a week.   No facility-administered encounter medications on file as of 11/29/2019.   Review of Systems  Constitutional: Negative for appetite change and unexpected weight change.  HENT: Negative for congestion and sinus pressure.   Respiratory: Negative for cough, chest tightness and shortness of breath.   Cardiovascular: Negative for chest pain, palpitations and leg swelling.  Gastrointestinal: Negative for abdominal pain, diarrhea, nausea and vomiting.  Genitourinary: Negative for difficulty urinating and dysuria.  Musculoskeletal: Negative for joint swelling and myalgias.  Skin: Negative for color change and rash.  Neurological: Negative for dizziness, light-headedness and headaches.  Psychiatric/Behavioral: Negative for agitation and dysphoric mood.       Increased stress as outlined.         Objective:    Physical Exam Vitals reviewed.  Constitutional:      General: She is not in acute distress.    Appearance: Normal appearance.  HENT:     Head: Normocephalic and atraumatic.     Right Ear: External ear normal.     Left Ear: External ear normal.  Eyes:     General: No scleral icterus.       Right eye: No discharge.        Left eye: No discharge.     Conjunctiva/sclera: Conjunctivae normal.  Neck:     Thyroid: No thyromegaly.  Cardiovascular:     Rate and Rhythm: Normal rate and regular rhythm.  Pulmonary:     Effort: No respiratory distress.     Breath sounds: Normal breath sounds. No wheezing.  Abdominal:     General: Bowel sounds are normal.     Palpations: Abdomen is soft.     Tenderness: There is no abdominal tenderness.  Musculoskeletal:        General: No swelling or tenderness.      Cervical back: Neck supple. No tenderness.  Lymphadenopathy:     Cervical: No cervical adenopathy.  Skin:    Findings: No erythema or rash.  Neurological:     Mental Status: She is alert.  Psychiatric:        Mood and Affect: Mood normal.        Behavior: Behavior normal.     BP 114/66   Pulse 74   Temp (!) 97.5 F (36.4 C) (Temporal)   Ht _0  (1.575 m)   Wt 179 lb (81.2 kg)   LMP 07/21/1988   SpO2 98%   BMI 32.74 kg/m  Wt Readings from Last 3 Encounters:  11/29/19 179 lb (81.2 kg)  08/29/19 177 lb (80.3 kg)  04/18/19 180 lb (81.6 kg)     Lab Results  Component Value Date   WBC 6.8 05/29/2019   HGB 13.2 05/29/2019   HCT 39.2 05/29/2019   PLT 239.0 05/29/2019   GLUCOSE 98 11/28/2019   CHOL 210 (H) 11/28/2019   TRIG 101.0 11/28/2019   HDL 68.20 11/28/2019   LDLDIRECT 101.4 05/07/2013   LDLCALC 121 (H) 11/28/2019   ALT 21 11/28/2019   AST 25 11/28/2019   NA 135 11/28/2019   K 3.8 11/28/2019   CL 98 11/28/2019   CREATININE 0.71 11/28/2019   BUN 13 11/28/2019   CO2 29 11/28/2019   TSH 1.46 04/13/2019   HGBA1C 5.9 11/28/2019   MICROALBUR <0.7 04/19/2018    DG Bone Density  Result Date: 05/02/2019 EXAM: DUAL X-RAY ABSORPTIOMETRY (DXA) FOR BONE MINERAL DENSITY IMPRESSION: crr Your patient Helen Marshall completed  a BMD test on 05/02/2019 using the [employer] (analysis version: 14.10) manufactured by [employer]. The following summarizes the results of our evaluation. PATIENT BIOGRAPHICAL: Name: Helen Marshall, Helen Marshall Patient ID: [id number] Birth Date: Dec 16, 1945 Height: 62.5 in. Gender: Female Exam Date: 05/02/2019 Weight: 181.8 lbs. Indications: Advanced Age, arthritis, Caucasian, Family Hx of Osteoporosis, Height Loss, History of Fracture (Adult), hx breast ca, Hysterectomy, POSTmenopausal, Rheumatoid Arthritis, skin ca Fractures: lt tib fib Treatments: 81 MG ASPIRIN, calcium /vit d, multivitamin, Vitamin D ASSESSMENT: The BMD measured at Femur Neck  Right is 0.791 g/cm2 with a T-score of -1.8. This patient is considered osteopenic according to [facility]) criteria. L-2&3 were excluded due to degenerative changes.The scan quality is good. Site Region Measured Measured WHO Young Adult BMD Date       Age      Classification T-score AP Spine L1-L4 (L2,L3) 4/02/2019 73.7 Normal -0.4 1.117 g/cm2 AP Spine L1-L4 (L2,L3) 12/01/2016 71.3 Normal -0.6 1.096 g/cm2 DualFemur Neck Right 05/02/2019 73.7 Osteopenia -1.8 0.791 g/cm2 DualFemur Neck Right 12/01/2016 71.3 Osteopenia -1.3 0.851 g/cm2 DualFemur Total Mean 05/02/2019 73.7 Normal -0.4 0.957 g/cm2 DualFemur Total Mean 12/01/2016 71.3 Normal -0.1 0.995 g/cm2 World Health Organization [facility]) criteria for post-menopausal, Caucasian Women: Normal:       T-score at or above -1 SD Osteopenia:   T-score between -1 and -2.5 SD Osteoporosis: T-score at or below -2.5 SD RECOMMENDATIONS: 1. All patients should optimize calcium and vitamin D intake. 2. Consider FDA-approved medical therapies in postmenopausal women and men aged 62 years and older, based on the following: a. A hip or vertebral (clinical or morphometric) fracture b. T-score < -2.5 at the femoral neck or spine after appropriate evaluation to exclude secondary causes c. Low bone mass (T-score between -1.0 and -2.5 at the femoral neck or spine) and a 10-year probability of a hip fracture > 3% or a 10-year probability of a major osteoporosis-related fracture > 20% based on the [place]-adapted WHO algorithm d. Clinician judgment and/or patient preferences may indicate treatment for people with 10-year fracture probabilities above or below these levels FOLLOW-UP: People with diagnosed cases of osteoporosis or at high risk for fracture should have regular bone mineral density tests. For patients eligible for Medicare, routine testing is allowed once every 2 years. The testing frequency can be increased to one year for patients who have rapidly progressing  disease, those who are receiving or discontinuing medical therapy to restore bone mass, or have additional risk factors. FRAX* RESULTS:  (version: 3.5) 10-year Probability of Fracture1 Major Osteoporotic Fracture2 Hip Fracture 22.0% 4.8% Population: [place] (Caucasian) Risk Factors: History of Fracture (Adult), Rheumatoid Arthritis Based on Femur (Right) Neck BMD 1 -The 10-year probability of fracture may be lower than reported if the patient has received treatment. 2 -Major Osteoporotic Fracture: Clinical Spine, Forearm, Hip or Shoulder *FRAX is a Materials engineer of the [employer] for Metabolic Bone Disease, a [place] (WHO) [facility]. ASSESSMENT: The probability of a major osteoporotic fracture is 22.0% within the next ten years. The probability of a hip fracture is 4.8% within the next ten years. Electronically Signed   By: Marlaine Hind M.D.   On: 05/02/2019 12:37       Assessment & Plan:   Problem List Items Addressed This Visit    Abnormal liver function test    Diet and exercise.  Follow liver panel.       Anemia    Follow  cbc.       Hypercholesterolemia    Intolerant to statin medication.  On zetia and niaspan.  Low cholesterol diet and exercise.  Follow lipid panel.        Relevant Orders   Hepatic function panel   Lipid panel   Hyperglycemia    Low carb diet and exercise.  Follow met b and a1c.       Relevant Orders   Hemoglobin A1c   Hypertension    Blood pressure as outlined.  Continue amlodipine, benazepril and hctz.  Follow pressures.  Follow metabolic panel.        Relevant Orders   TSH   Basic metabolic panel   Neoplasm of left breast, primary tumor 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)    Followed by Dr Bary Castilla as outlined.  Mammogram 09/17/19 - ok.  Recommended f/u in one year.        Osteopenia    Was referred to endocrinology for reclast - given 10 year risk of major osteoporotic fracture  is >20%.  She was started on fosamax.  Did not tolerate.  Continue calcium, vitamin d and weight bearing exercise.  Follow.            Einar Pheasant, MD

## 2019-12-03 ENCOUNTER — Encounter: Payer: Self-pay | Admitting: Internal Medicine

## 2019-12-03 NOTE — Assessment & Plan Note (Signed)
Low carb diet and exercise.  Follow met b and a1c.

## 2019-12-03 NOTE — Assessment & Plan Note (Signed)
Intolerant to statin medication.  On zetia and niaspan.  Low cholesterol diet and exercise.  Follow lipid panel.

## 2019-12-03 NOTE — Assessment & Plan Note (Signed)
Diet and exercise.  Follow liver panel.   

## 2019-12-03 NOTE — Assessment & Plan Note (Signed)
Follow cbc.  

## 2019-12-03 NOTE — Assessment & Plan Note (Signed)
Was referred to endocrinology for reclast - given 10 year risk of major osteoporotic fracture is >20%.  She was started on fosamax.  Did not tolerate.  Continue calcium, vitamin d and weight bearing exercise.  Follow.

## 2019-12-03 NOTE — Assessment & Plan Note (Signed)
Blood pressure as outlined.  Continue amlodipine, benazepril and hctz.  Follow pressures.  Follow metabolic panel.  

## 2019-12-03 NOTE — Assessment & Plan Note (Signed)
Followed by Dr Bary Castilla as outlined.  Mammogram 09/17/19 - ok.  Recommended f/u in one year.

## 2019-12-13 ENCOUNTER — Telehealth: Payer: Self-pay | Admitting: Internal Medicine

## 2019-12-13 MED ORDER — INTEGRA 62.5-62.5-40-3 MG PO CAPS: ORAL_CAPSULE | ORAL | 0 refills | Status: DC

## 2019-12-13 NOTE — Telephone Encounter (Signed)
Pt called her pharmacy last week and they told her that they have faxed [place] a request 3x for Integra. Pt is without medication.

## 2019-12-27 ENCOUNTER — Ambulatory Visit (INDEPENDENT_AMBULATORY_CARE_PROVIDER_SITE_OTHER): Payer: Medicare Other

## 2019-12-27 VITALS — Ht 62.0 in | Wt 177.0 lb

## 2019-12-27 DIAGNOSIS — Z0000 Encounter for general adult medical examination without abnormal findings: Secondary | ICD-10-CM

## 2019-12-27 NOTE — Patient Instructions (Addendum)
  Helen Marshall , Thank you for taking time to come for your Medicare Wellness Visit. I appreciate your ongoing commitment to your health goals. Please review the following plan we discussed and let me know if I can assist you in the future.   These are the goals we discussed: Goals      Patient Stated   . Weight (lb) < 177 lb (80.3 kg) (pt-stated)     Eat more vegatables       This is a list of the screening recommended for you and due dates:  Health Maintenance  Topic Date Due  . Flu Shot  03/02/2020  . Mammogram  09/16/2020  . Colon Cancer Screening  03/04/2021  . Tetanus Vaccine  04/26/2024  . DEXA scan (bone density measurement)  Completed  . COVID-19 Vaccine  Completed  .  Hepatitis C: One time screening is recommended by Center for Disease Control  (CDC) for  adults born from [birth year] through 1966.   Completed  . Pneumonia vaccines  Completed

## 2019-12-27 NOTE — Progress Notes (Addendum)
Subjective:   Helen Marshall is a 74 y.o. female who presents for a subsequent Medicare Annual Wellness Visit.  Review of Systems    No ROS  Medicare Wellness virtual visit  Cardiac Risk Factors include: hypertension;obesity (BMI >30kg/m2)     Objective:    Today's Vitals   12/27/19 1100  Weight: 177 lb (80.3 kg)  Height: 5\' 2"  (1.575 m)   Body mass index is 32.37 kg/m.  Advanced Directives 12/27/2019 11/17/2018 08/10/2018 08/09/2017 04/18/2017 07/12/2016 03/04/2016  Does Patient Have a Medical Advance Directive? Yes Yes Yes Yes Yes Yes Yes  Type of Paramedic of [place];Living will Living will Living will;Healthcare Power of [place];Living will [place];Living will [place];Living will Living will;Healthcare Power of Attorney  Does patient want to make changes to medical advance directive? No - Patient declined - No - Patient declined No - Patient declined - No - Patient declined -  Copy of [place] in Chart? No - copy requested - No - copy requested No - copy requested - No - copy requested -  Would patient like information on creating a medical advance directive? - No - Guardian declined - - - - -    Current Medications (verified) Outpatient Encounter Medications as of 12/27/2019  Medication Sig  . albuterol (VENTOLIN HFA) 108 (90 Base) MCG/ACT inhaler USE 2 INHALATIONS EVERY 6 HOURS AS NEEDED FOR WHEEZING OR SHORTNESS OF BREATH  . amLODipine (NORVASC) 5 MG tablet Take 1 tablet (5 mg total) by mouth daily.  . benazepril (LOTENSIN) 40 MG tablet Take 1 tablet (40 mg total) by mouth daily.  . calcium-vitamin D (CALCIUM 500+D) 500-400 MG-UNIT per tablet Take 1 tablet by mouth daily.   Marland Kitchen ezetimibe (ZETIA) 10 MG tablet TAKE 1 TABLET BY MOUTH EVERY DAY  . Fe Fum-FePoly-Vit C-Vit B3 (INTEGRA) 62.5-62.5-40-3 MG CAPS TAKE 1 CAPSULE BY MOUTH EVERY DAY  . fish oil-omega-3 fatty  acids 1000 MG capsule 5 (five) times daily.   . fluticasone (FLONASE) 50 MCG/ACT nasal spray Place 2 sprays into both nostrils as needed.  . hydrochlorothiazide (HYDRODIURIL) 25 MG tablet Take 1 tablet (25 mg total) by mouth daily.  . Multiple Vitamin (MULTIVITAMIN) tablet Take 1 tablet by mouth daily.  . niacin (NIASPAN) 1000 MG CR tablet Take 1,000 mg by mouth 2 (two) times daily.  . potassium chloride (KLOR-CON) 10 MEQ tablet Take 1 tablet (10 mEq total) by mouth daily.  . Probiotic Product (VSL#3 PO) Take by mouth. 1 capsul daily  . triamcinolone cream (KENALOG) 0.1 % Apply 1 application topically as needed.    No facility-administered encounter medications on file as of 12/27/2019.    Allergies (verified) Crestor [rosuvastatin], Demerol  [meperidine hcl], Demerol [meperidine], Gabapentin, Latex, Lipitor [atorvastatin], Lovastatin, Pravastatin, Tramadol, Vicodin [hydrocodone-acetaminophen], and Zocor [simvastatin]   History: Past Medical History:  Diagnosis Date  . Abnormal LFTs   . Abnormal mammogram 12/19/2012   left  . Allergy   . Anemia   . Cancer [facility]) 2014   left Breast  . Carotid bruit    left  . Cataract   . Colon polyps   . Diverticulosis   . Diverticulosis 2012  . Environmental allergies   . GERD (gastroesophageal reflux disease)   . Heart murmur   . Hypercholesterolemia   . Hyperglycemia   . Hypertension   . IBS (irritable bowel syndrome)   . Lipoma of colon   .  Lumbar disc disease   . 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  . Tubular adenoma of colon    Past Surgical History:  Procedure Laterality Date  . ABDOMINAL HYSTERECTOMY  1989   fibroids  . ANKLE SURGERY Left 1999  . BREAST SURGERY Left 1970   biopsy  . CATARACT EXTRACTION, BILATERAL   June & July 2017  . CHOLECYSTECTOMY  2001  . COLONOSCOPY  06/02/2011   Verdie Shire, MD; diverticulosis, submucosal lipoma of the sigmoid colon.  Marland Kitchen HERNIA REPAIR  2001, 2004  . HERNIA REPAIR  01/22/2013   Repeat repair of umbilical defect, 2.5 cm. Primary repair.  Marland Kitchen NASAL SINUS SURGERY  1997  . rotator cuff surgery  1998  . SQUAMOUS CELL CARCINOMA EXCISION Right 05/2013   shoulder  . TUBAL LIGATION  1976  . UPPER GI ENDOSCOPY  08/22/14   Dr Billie Lade   Family History  Problem Relation Age of Onset  . CVA Father   . Alcohol abuse Father   . Heart disease Mother        myocardial infarction  . Diabetes Mother   . Hearing loss Mother   . Hypertension Brother        x2  . Arthritis Brother   . Hearing loss Brother   . Alcohol abuse Brother   . Arthritis Brother   . Hearing loss Brother   . Arthritis/Rheumatoid Sister        x2  . Breast cancer Sister 89  . Arthritis Sister   . Hearing loss Sister   . Arthritis Sister   . Hearing loss Sister   . Asthma Sister   . Arthritis Sister   . Hearing loss Sister   . Hypothyroidism Sister   . Arthritis Sister   . Hearing loss Sister   . Cancer Other        breast - paternal first cousin  . Colon cancer Maternal Uncle   . Colon polyps Son   . Colon polyps Sister   . Arthritis Sister   . Hearing loss Sister    Social History   Socioeconomic History  . Marital status: Married    Spouse name: Not on file  . Number of children: 2  . Years of education: Not on file  . Highest education level: Not on file  Occupational History  . Occupation: Retired  Tobacco Use  . Smoking status: Never Smoker  . Smokeless tobacco: Never Used  Substance and Sexual Activity  . Alcohol use: No    Alcohol/week: 0.0 standard drinks  . Drug use: No  . Sexual activity: Never  Other Topics Concern  . Not on file  Social History Narrative  . Not on file   Social Determinants of Health   Financial Resource Strain:   . Difficulty of Paying  Living Expenses:   Food Insecurity:   . Worried About Charity fundraiser in the Last Year:   . Arboriculturist in the Last Year:   Transportation Needs:   . Film/video editor (Medical):   Marland Kitchen Lack of Transportation (Non-Medical):   Physical Activity:   . Days of Exercise per Week:   . Minutes of Exercise per Session:   Stress:   . Feeling of Stress :   Social Connections: Unknown  .  Frequency of Communication with Friends and Family: Once a week  . Frequency of Social Gatherings with Friends and Family: Once a week  . Attends Religious Services: Not on file  . Active Member of Clubs or Organizations: Not on file  . Attends Archivist Meetings: Not on file  . Marital Status: Married    Tobacco Counseling Non smoker  Clinical Intake:  Pre-visit preparation completed: Yes  Pain : No/denies pain     Nutritional Status: BMI > 30  Obese Diabetes: No  How often do you need to have someone help you when you read instructions, pamphlets, or other written materials from your doctor or pharmacy?: 1 - Never  Interpreter Needed?: No      Activities of Daily Living In your present state of health, do you have any difficulty performing the following activities: 12/27/2019  Hearing? N  Vision? N  Difficulty concentrating or making decisions? N  Walking or climbing stairs? N  Dressing or bathing? N  Doing errands, shopping? N  Preparing Food and eating ? N  Using the Toilet? N  In the past six months, have you accidently leaked urine? N  Do you have problems with loss of bowel control? N  Managing your Medications? N  Managing your Finances? N  Housekeeping or managing your Housekeeping? N  Some recent data might be hidden     Immunizations and Health Maintenance Immunization History  Administered Date(s) Administered  . Fluad Quad(high Dose 65+) 04/19/2019  . Influenza Split 05/16/2012, 04/02/2017  . Influenza, High Dose Seasonal PF 05/15/2016,  03/30/2017, 05/25/2018  . Influenza,inj,Quad PF,6+ Mos 05/09/2013, 05/23/2014, 07/03/2015  . PFIZER SARS-COV-2 Vaccination 08/09/2019, 09/01/2019  . Pneumococcal Conjugate-13 09/13/2013  . Pneumococcal Polysaccharide-23 11/08/2014  . Pneumococcal-Unspecified 08/02/2009  . Tdap 04/26/2014  . Zoster 08/02/2009  . Zoster Recombinat (Shingrix) 01/03/2018, 03/21/2018   There are no preventive care reminders to display for this patient.  Patient Care Team: Einar Pheasant, MD as PCP - General (Internal Medicine) Bary Castilla Forest Gleason, MD (General Surgery) Dasher, Rayvon Char, MD (Dermatology) Albertine Patricia, [place] (Podiatry)      Assessment:   This is a routine wellness examination for [employer].  I connected with [name] today by telephone and verified that I am speaking with the correct person using two identifiers. Location patient: home Location provider: work Persons participating in the virtual visit: patient, Marine scientist.   I discussed the limitations, risks, security and privacy concerns of performing an evaluation and management service by telephone and the availability of in person appointments. I also discussed with the patient that there may be a patient responsible charge related to this service. The patient expressed understanding and verbally consented to this telephonic visit.    Interactive audio and video telecommunications were attempted between this provider and patient, however failed, due to patient having technical difficulties OR patient did not have access to video capability.  We continued and completed visit with audio only.  Some vital signs may be absent or patient reported.   Time Spent with patient on telephone encounter: 30 minutes  Hearing/Vision screen  Hearing Screening   125Hz  250Hz  500Hz  1000Hz  2000Hz  3000Hz  4000Hz  6000Hz  8000Hz   Right ear:           Left ear:           Comments: Patient is able to hear conversational tones without difficulty.  No issues  reported.    Vision Screening Comments: Reading glasses only Cataracts removed  Dietary issues and  exercise activities discussed: Current Exercise Habits: The patient does not participate in regular exercise at present, Exercise limited by: None identified  Goals Addressed            This Visit's Progress     Patient Stated   . Weight (lb) < 177 lb (80.3 kg) (pt-stated)   177 lb (80.3 kg)    Eat more vegatables      Depression Screen PHQ 2/9 Scores 12/27/2019 08/29/2019 08/10/2018 08/09/2017 07/19/2017 07/12/2016 11/05/2015  PHQ - 2 Score 0 0 1 0 0 0 0  PHQ- 9 Score - - - 0 - - -    Fall Risk Fall Risk  12/27/2019 11/29/2019 09/19/2018 08/10/2018 08/09/2017  Falls in the past year? 0 0 0 0 No  Number falls in past yr: 0 - 0 - -  Follow up - Falls evaluation completed Falls evaluation completed - -   FALL RISK PREVENTION PERTAINING TO THE HOME:  Any stairs in or around the home? Yes If so, are there any without handrails? Yes      DME ORDERS:  DME order needed?  No  TIMED UP AND GO:  Was the test performed? No  Virtual Visit      Cognitive Function: MMSE - Mini Mental State Exam 08/09/2017 07/12/2016 07/17/2015  Orientation to time 5 5 5   Orientation to Place 5 5 5   Registration 3 3 3   Attention/ Calculation 5 5 5   Recall 3 3 3   Language- name 2 objects 2 2 2   Language- repeat 1 1 1   Language- follow 3 step command 3 3 3   Language- read & follow direction 1 1 1   Write a sentence 1 1 1   Copy design 1 1 1   Total score 30 30 30      6CIT Screen 12/27/2019 08/10/2018  What Year? 0 points 0 points  What month? 0 points 0 points  What time? 0 points 0 points  Count back from 20 0 points 0 points  Months in reverse 0 points 0 points  Repeat phrase 0 points 0 points  Total Score 0 0    Screening Tests Health Maintenance  Topic Date Due  . INFLUENZA VACCINE  03/02/2020  . MAMMOGRAM  09/16/2020  . COLONOSCOPY  03/04/2021  . TETANUS/TDAP  04/26/2024  . DEXA SCAN   Completed  . COVID-19 Vaccine  Completed  . Hepatitis C Screening  Completed  . PNA vac Low Risk Adult  Completed    Qualifies for Shingles Vaccine? No    Zostavax completed 12/2017.  Lung Cancer Screening: (Low Dose CT Chest recommended if Age 30-80 years, 30 pack-year currently smoking OR have quit w/in 15years.) Does not qualify.    Additional Screening:  Vision Screening: Recommended annual ophthalmology exams for early detection of glaucoma and other disorders of the eye. Is the patient up to date with their annual eye exam?  No Who is the provider or what is the name of the office in which the pt attends annual eye exams? Patient is in process of choosing a new Ophthalmologist and will be making an appointment once she makes a decision.  Dental Screening: Recommended annual dental exams for proper oral hygiene. Patient's sees her dentist every 6 months.   Community Resource Referral:  CRR required this visit?  No     Plan:  I have personally reviewed and addressed the Medicare Annual Wellness questionnaire and have noted the following in the patient's chart:  A. Medical and social history  B. Use of alcohol, tobacco or illicit drugs  C. Current medications and supplements D. Functional ability and status E.  Nutritional status F.  Physical activity G. Advance directives H. List of other physicians I.  Hospitalizations, surgeries, and ER visits in previous 12 months J.  [place] such as hearing and vision if needed, cognitive and depression L. Referrals and appointments   In addition, I have reviewed and discussed with patient certain preventive protocols, quality metrics, and best practice recommendations. A written personalized care plan for preventive services as well as general preventive health recommendations were provided to patient.   Signed,    Marta Antu, LPN   [id number]  Nurse Health Advisor  Nurse Notes:  Keep all routine  maintenance appointments.   Follow up appt with Dr. Nicki Reaper on 03/03/2020 @ 2pm   Reviewed above information.  Agree with assessment and plan.    Dr Nicki Reaper

## 2019-12-27 NOTE — Progress Notes (Signed)
Subjective:   Helen Marshall is a 74 y.o. female who presents for a subsequent Medicare Annual Wellness Visit.  Review of Systems    No ROS  Medicare Wellness virtual visit  Cardiac Risk Factors include: hypertension;obesity (BMI >30kg/m2)     Objective:    Today's Vitals   12/27/19 1100  Weight: 177 lb (80.3 kg)  Height: 5\' 2"  (1.575 m)   Body mass index is 32.37 kg/m.  Advanced Directives 12/27/2019 11/17/2018 08/10/2018 08/09/2017 04/18/2017 07/12/2016 03/04/2016  Does Patient Have a Medical Advance Directive? Yes Yes Yes Yes Yes Yes Yes  Type of Paramedic of [place];Living will Living will Living will;Healthcare Power of [place];Living will [place];Living will [place];Living will Living will;Healthcare Power of Attorney  Does patient want to make changes to medical advance directive? No - Patient declined - No - Patient declined No - Patient declined - No - Patient declined -  Copy of [place] in Chart? No - copy requested - No - copy requested No - copy requested - No - copy requested -  Would patient like information on creating a medical advance directive? - No - Guardian declined - - - - -    Current Medications (verified) Outpatient Encounter Medications as of 12/27/2019  Medication Sig  . albuterol (VENTOLIN HFA) 108 (90 Base) MCG/ACT inhaler USE 2 INHALATIONS EVERY 6 HOURS AS NEEDED FOR WHEEZING OR SHORTNESS OF BREATH  . amLODipine (NORVASC) 5 MG tablet Take 1 tablet (5 mg total) by mouth daily.  . benazepril (LOTENSIN) 40 MG tablet Take 1 tablet (40 mg total) by mouth daily.  . calcium-vitamin D (CALCIUM 500+D) 500-400 MG-UNIT per tablet Take 1 tablet by mouth daily.   Marland Kitchen ezetimibe (ZETIA) 10 MG tablet TAKE 1 TABLET BY MOUTH EVERY DAY  . Fe Fum-FePoly-Vit C-Vit B3 (INTEGRA) 62.5-62.5-40-3 MG CAPS TAKE 1 CAPSULE BY MOUTH EVERY DAY  . fish oil-omega-3 fatty  acids 1000 MG capsule 5 (five) times daily.   . fluticasone (FLONASE) 50 MCG/ACT nasal spray Place 2 sprays into both nostrils as needed.  . hydrochlorothiazide (HYDRODIURIL) 25 MG tablet Take 1 tablet (25 mg total) by mouth daily.  . Multiple Vitamin (MULTIVITAMIN) tablet Take 1 tablet by mouth daily.  . niacin (NIASPAN) 1000 MG CR tablet Take 1,000 mg by mouth 2 (two) times daily.  . potassium chloride (KLOR-CON) 10 MEQ tablet Take 1 tablet (10 mEq total) by mouth daily.  . Probiotic Product (VSL#3 PO) Take by mouth. 1 capsul daily  . triamcinolone cream (KENALOG) 0.1 % Apply 1 application topically as needed.    No facility-administered encounter medications on file as of 12/27/2019.    Allergies (verified) Crestor [rosuvastatin], Demerol  [meperidine hcl], Demerol [meperidine], Gabapentin, Latex, Lipitor [atorvastatin], Lovastatin, Pravastatin, Tramadol, Vicodin [hydrocodone-acetaminophen], and Zocor [simvastatin]   History: Past Medical History:  Diagnosis Date  . Abnormal LFTs   . Abnormal mammogram 12/19/2012   left  . Allergy   . Anemia   . Cancer [facility]) 2014   left Breast  . Carotid bruit    left  . Cataract   . Colon polyps   . Diverticulosis   . Diverticulosis 2012  . Environmental allergies   . GERD (gastroesophageal reflux disease)   . Heart murmur   . Hypercholesterolemia   . Hyperglycemia   . Hypertension   . IBS (irritable bowel syndrome)   . Lipoma of colon   .  Lumbar disc disease   . 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  . Tubular adenoma of colon    Past Surgical History:  Procedure Laterality Date  . ABDOMINAL HYSTERECTOMY  1989   fibroids  . ANKLE SURGERY Left 1999  . BREAST SURGERY Left 1970   biopsy  . CATARACT EXTRACTION, BILATERAL   June & July 2017  . CHOLECYSTECTOMY  2001  . COLONOSCOPY  06/02/2011   Verdie Shire, MD; diverticulosis, submucosal lipoma of the sigmoid colon.  Marland Kitchen HERNIA REPAIR  2001, 2004  . HERNIA REPAIR  01/22/2013   Repeat repair of umbilical defect, 2.5 cm. Primary repair.  Marland Kitchen NASAL SINUS SURGERY  1997  . rotator cuff surgery  1998  . SQUAMOUS CELL CARCINOMA EXCISION Right 05/2013   shoulder  . TUBAL LIGATION  1976  . UPPER GI ENDOSCOPY  08/22/14   Dr Billie Lade   Family History  Problem Relation Age of Onset  . CVA Father   . Alcohol abuse Father   . Heart disease Mother        myocardial infarction  . Diabetes Mother   . Hearing loss Mother   . Hypertension Brother        x2  . Arthritis Brother   . Hearing loss Brother   . Alcohol abuse Brother   . Arthritis Brother   . Hearing loss Brother   . Arthritis/Rheumatoid Sister        x2  . Breast cancer Sister 9  . Arthritis Sister   . Hearing loss Sister   . Arthritis Sister   . Hearing loss Sister   . Asthma Sister   . Arthritis Sister   . Hearing loss Sister   . Hypothyroidism Sister   . Arthritis Sister   . Hearing loss Sister   . Cancer Other        breast - paternal first cousin  . Colon cancer Maternal Uncle   . Colon polyps Son   . Colon polyps Sister   . Arthritis Sister   . Hearing loss Sister    Social History   Socioeconomic History  . Marital status: Married    Spouse name: Not on file  . Number of children: 2  . Years of education: Not on file  . Highest education level: Not on file  Occupational History  . Occupation: Retired  Tobacco Use  . Smoking status: Never Smoker  . Smokeless tobacco: Never Used  Substance and Sexual Activity  . Alcohol use: No    Alcohol/week: 0.0 standard drinks  . Drug use: No  . Sexual activity: Never  Other Topics Concern  . Not on file  Social History Narrative  . Not on file   Social Determinants of Health   Financial Resource Strain:   . Difficulty of Paying  Living Expenses:   Food Insecurity:   . Worried About Charity fundraiser in the Last Year:   . Arboriculturist in the Last Year:   Transportation Needs:   . Film/video editor (Medical):   Marland Kitchen Lack of Transportation (Non-Medical):   Physical Activity:   . Days of Exercise per Week:   . Minutes of Exercise per Session:   Stress:   . Feeling of Stress :   Social Connections: Unknown  .  Frequency of Communication with Friends and Family: Once a week  . Frequency of Social Gatherings with Friends and Family: Once a week  . Attends Religious Services: Not on file  . Active Member of Clubs or Organizations: Not on file  . Attends Archivist Meetings: Not on file  . Marital Status: Married    Tobacco Counseling Counseling given: Not Answered   Clinical Intake:  Pre-visit preparation completed: Yes  Pain : No/denies pain     Nutritional Status: BMI > 30  Obese Diabetes: No  How often do you need to have someone help you when you read instructions, pamphlets, or other written materials from your doctor or pharmacy?: 1 - Never  Interpreter Needed?: No      Activities of Daily Living In your present state of health, do you have any difficulty performing the following activities: 12/27/2019  Hearing? N  Vision? N  Difficulty concentrating or making decisions? N  Walking or climbing stairs? N  Dressing or bathing? N  Doing errands, shopping? N  Preparing Food and eating ? N  Using the Toilet? N  In the past six months, have you accidently leaked urine? N  Do you have problems with loss of bowel control? N  Managing your Medications? N  Managing your Finances? N  Housekeeping or managing your Housekeeping? N  Some recent data might be hidden     Immunizations and Health Maintenance Immunization History  Administered Date(s) Administered  . Fluad Quad(high Dose 65+) 04/19/2019  . Influenza Split 05/16/2012, 04/02/2017  . Influenza, High Dose Seasonal PF  05/15/2016, 03/30/2017, 05/25/2018  . Influenza,inj,Quad PF,6+ Mos 05/09/2013, 05/23/2014, 07/03/2015  . PFIZER SARS-COV-2 Vaccination 08/09/2019, 09/01/2019  . Pneumococcal Conjugate-13 09/13/2013  . Pneumococcal Polysaccharide-23 11/08/2014  . Pneumococcal-Unspecified 08/02/2009  . Tdap 04/26/2014  . Zoster 08/02/2009  . Zoster Recombinat (Shingrix) 01/03/2018, 03/21/2018   There are no preventive care reminders to display for this patient.  Patient Care Team: Einar Pheasant, MD as PCP - General (Internal Medicine) Bary Castilla Forest Gleason, MD (General Surgery) Dasher, Rayvon Char, MD (Dermatology) Troxler, Rodman Key, DPM (Podiatry)  Indicate any recent Medical Services you may have received from other than [facility] providers in the past year (date may be approximate).     Assessment:   This is a routine wellness examination for [employer].  I connected with [name] today by telephone and verified that I am speaking with the correct person using two identifiers. Location patient: home Location provider: work Persons participating in the virtual visit: patient, Marine scientist.   I discussed the limitations, risks, security and privacy concerns of performing an evaluation and management service by telephone and the availability of in person appointments. I also discussed with the patient that there may be a patient responsible charge related to this service. The patient expressed understanding and verbally consented to this telephonic visit.    Interactive audio and video telecommunications were attempted between this provider and patient, however failed, due to patient having technical difficulties OR patient did not have access to video capability.  We continued and completed visit with audio only.  Some vital signs may be absent or patient reported.   Time Spent with patient on telephone encounter: 30 minutes   Dietary issues and exercise activities discussed: Current Exercise Habits: The patient  does not participate in regular exercise at present, Exercise limited by: None identified  Goals Addressed            This Visit's Progress  Patient Stated   . Weight (lb) < 177 lb (80.3 kg) (pt-stated)   177 lb (80.3 kg)    Eat more vegatables      Depression Screen PHQ 2/9 Scores 12/27/2019 08/29/2019 08/10/2018 08/09/2017 07/19/2017 07/12/2016 11/05/2015  PHQ - 2 Score 0 0 1 0 0 0 0  PHQ- 9 Score - - - 0 - - -      TIMED UP AND GO:  Was the test performed? No  Virtual Visit   Cognitive Function: MMSE - Mini Mental State Exam 08/09/2017 07/12/2016 07/17/2015  Orientation to time 5 5 5   Orientation to Place 5 5 5   Registration 3 3 3   Attention/ Calculation 5 5 5   Recall 3 3 3   Language- name 2 objects 2 2 2   Language- repeat 1 1 1   Language- follow 3 step command 3 3 3   Language- read & follow direction 1 1 1   Write a sentence 1 1 1   Copy design 1 1 1   Total score 30 30 30      6CIT Screen 12/27/2019 08/10/2018  What Year? 0 points 0 points  What month? 0 points 0 points  What time? 0 points 0 points  Count back from 20 0 points 0 points  Months in reverse 0 points 0 points  Repeat phrase 0 points 0 points  Total Score 0 0    Screening Tests Health Maintenance  Topic Date Due  . INFLUENZA VACCINE  03/02/2020  . MAMMOGRAM  09/16/2020  . COLONOSCOPY  03/04/2021  . TETANUS/TDAP  04/26/2024  . DEXA SCAN  Completed  . COVID-19 Vaccine  Completed  . Hepatitis C Screening  Completed  . PNA vac Low Risk Adult  Completed        Lung Cancer Screening Referral: An Epic message has been sent to Burgess Estelle, RN (Oncology Nurse Navigator) regarding the possible need for this exam. Raquel Sarna will review the patient's chart to determine if the patient truly qualifies for the exam. If the patient qualifies, Raquel Sarna will order the Low Dose CT of the chest to facilitate the scheduling of this exam.    Vision Screening: Recommended annual ophthalmology exams for early  detection of glaucoma and other disorders of the eye. Is the patient up to date with their annual eye exam? no Who is the provider or what is the name of the office in which the pt attends annual eye exams?    Dental Screening: Recommended annual dental exams for proper oral hygiene  Community Resource Referral:  CRR required this visit? no     Plan:  I have personally reviewed and addressed the Medicare Annual Wellness questionnaire and have noted the following in the patient's chart:  A. Medical and social history B. Use of alcohol, tobacco or illicit drugs  C. Current medications and supplements D. Functional ability and status E.  Nutritional status F.  Physical activity G. Advance directives H. List of other physicians I.  Hospitalizations, surgeries, and ER visits in previous 12 months J.  [place] such as hearing and vision if needed, cognitive and depression L. Referrals and appointments   In addition, I have reviewed and discussed with patient certain preventive protocols, quality metrics, and best practice recommendations. A written personalized care plan for preventive services as well as general preventive health recommendations were provided to patient.   Signed,    Marta Antu, LPN   [id number]  Nurse Health Advisor   Nurse Notes: Broken note due to  server disruption. See second complete note.

## 2020-01-18 ENCOUNTER — Encounter: Payer: Self-pay | Admitting: Internal Medicine

## 2020-01-18 DIAGNOSIS — M8580 Other specified disorders of bone density and structure, unspecified site: Secondary | ICD-10-CM

## 2020-01-19 NOTE — Telephone Encounter (Signed)
Order placed for endocrinology referral.  

## 2020-01-21 ENCOUNTER — Other Ambulatory Visit: Payer: Self-pay | Admitting: Internal Medicine

## 2020-02-28 ENCOUNTER — Other Ambulatory Visit: Payer: Self-pay

## 2020-02-28 ENCOUNTER — Other Ambulatory Visit (INDEPENDENT_AMBULATORY_CARE_PROVIDER_SITE_OTHER): Payer: Medicare Other

## 2020-02-28 DIAGNOSIS — E7800 Pure hypercholesterolemia, unspecified: Secondary | ICD-10-CM | POA: Diagnosis not present

## 2020-02-28 DIAGNOSIS — R739 Hyperglycemia, unspecified: Secondary | ICD-10-CM

## 2020-02-28 DIAGNOSIS — I10 Essential (primary) hypertension: Secondary | ICD-10-CM

## 2020-02-28 LAB — HEPATIC FUNCTION PANEL
ALT: 21 U/L (ref 0–35)
AST: 25 U/L (ref 0–37)
Albumin: 4.2 g/dL (ref 3.5–5.2)
Alkaline Phosphatase: 61 U/L (ref 39–117)
Bilirubin, Direct: 0.1 mg/dL (ref 0.0–0.3)
Total Bilirubin: 0.6 mg/dL (ref 0.2–1.2)
Total Protein: 6.4 g/dL (ref 6.0–8.3)

## 2020-02-28 LAB — BASIC METABOLIC PANEL
BUN: 12 mg/dL (ref 6–23)
CO2: 29 mEq/L (ref 19–32)
Calcium: 9.6 mg/dL (ref 8.4–10.5)
Chloride: 99 mEq/L (ref 96–112)
Creatinine, Ser: 0.72 mg/dL (ref 0.40–1.20)
GFR: 79.05 mL/min (ref >60.00)
Glucose, Bld: 110 mg/dL — ABNORMAL HIGH (ref 70–99)
Potassium: 3.4 mEq/L — ABNORMAL LOW (ref 3.5–5.1)
Sodium: 134 mEq/L — ABNORMAL LOW (ref 135–145)

## 2020-02-28 LAB — LIPID PANEL
Cholesterol: 206 mg/dL — ABNORMAL HIGH (ref 0–200)
HDL: 87.3 mg/dL (ref >39.00)
LDL Cholesterol: 101 mg/dL — ABNORMAL HIGH (ref 0–99)
NonHDL: 118.73
Total CHOL/HDL Ratio: 2
Triglycerides: 91 mg/dL (ref 0.0–149.0)
VLDL: 18.2 mg/dL (ref 0.0–40.0)

## 2020-02-28 LAB — HEMOGLOBIN A1C: Hgb A1c MFr Bld: 5.8 % (ref 4.6–6.5)

## 2020-02-28 LAB — TSH: TSH: 1.4 u[IU]/mL (ref 0.35–4.50)

## 2020-03-03 ENCOUNTER — Encounter: Payer: Self-pay | Admitting: Internal Medicine

## 2020-03-03 ENCOUNTER — Ambulatory Visit: Payer: Medicare Other | Admitting: Internal Medicine

## 2020-03-03 ENCOUNTER — Other Ambulatory Visit: Payer: Self-pay

## 2020-03-03 DIAGNOSIS — M8580 Other specified disorders of bone density and structure, unspecified site: Secondary | ICD-10-CM

## 2020-03-03 DIAGNOSIS — R739 Hyperglycemia, unspecified: Secondary | ICD-10-CM

## 2020-03-03 DIAGNOSIS — N8110 Cystocele, unspecified: Secondary | ICD-10-CM

## 2020-03-03 DIAGNOSIS — D649 Anemia, unspecified: Secondary | ICD-10-CM

## 2020-03-03 DIAGNOSIS — I10 Essential (primary) hypertension: Secondary | ICD-10-CM | POA: Diagnosis not present

## 2020-03-03 DIAGNOSIS — R945 Abnormal results of liver function studies: Secondary | ICD-10-CM

## 2020-03-03 DIAGNOSIS — R7989 Other specified abnormal findings of blood chemistry: Secondary | ICD-10-CM

## 2020-03-03 DIAGNOSIS — K429 Umbilical hernia without obstruction or gangrene: Secondary | ICD-10-CM | POA: Diagnosis not present

## 2020-03-03 DIAGNOSIS — E7800 Pure hypercholesterolemia, unspecified: Secondary | ICD-10-CM

## 2020-03-03 DIAGNOSIS — E878 Other disorders of electrolyte and fluid balance, not elsewhere classified: Secondary | ICD-10-CM | POA: Diagnosis not present

## 2020-03-03 DIAGNOSIS — R251 Tremor, unspecified: Secondary | ICD-10-CM

## 2020-03-03 MED ORDER — FLUTICASONE PROPIONATE 50 MCG/ACT NA SUSP: 2.0000 | NASAL | 3 refills | Status: DC | PRN

## 2020-03-03 NOTE — Progress Notes (Signed)
Patient ID: Helen Marshall, female   DOB: 05/22/1946, 74 y.o.   MRN: [id number]   Subjective:    Patient ID: Helen Marshall, female    DOB: 1946-03-20, 74 y.o.   MRN: [id number]  HPI This visit occurred during the SARS-CoV-2 public health emergency.  Safety protocols were in place, including screening questions prior to the visit, additional usage of staff PPE, and extensive cleaning of exam room while observing appropriate contact time as indicated for disinfecting solutions.  Patient here for a scheduled follow up.  She reports she is doing relatively well.  Has seen endocrinology - osteopenia.  Was on fosamax.  Did not tolerate.  Off now.  Discussed calcium, vitamin D and weight bearing exercise.  Also discussed reclast.  Continue f/u with endocrinology.  Tries to stay active.  No chest pain or sob reported.  No abdominal pain.  Bowels moving.  Concern regarding hand tremors.  Does not affect eating or drinking.  Blood pressure dong well.  Is concerned regarding "bladder bulge".   Handling stress.   Past Medical History:  Diagnosis Date  . Abnormal LFTs   . Abnormal mammogram 12/19/2012   left  . Allergy   . Anemia   . Cancer [facility]) 2014   left Breast  . Carotid bruit    left  . Cataract   . Colon polyps   . Diverticulosis   . Diverticulosis 2012  . Environmental allergies   . GERD (gastroesophageal reflux disease)   . Heart murmur   . Hypercholesterolemia   . Hyperglycemia   . Hypertension   . IBS (irritable bowel syndrome)   . Lipoma of colon   . Lumbar disc disease   . 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  . Tubular adenoma of colon    Past Surgical History:  Procedure Laterality Date  . ABDOMINAL HYSTERECTOMY  1989   fibroids  . ANKLE SURGERY Left 1999  .  BREAST SURGERY Left 1970   biopsy  . CATARACT EXTRACTION, BILATERAL  June & July 2017  . CHOLECYSTECTOMY  2001  . COLONOSCOPY  06/02/2011   Verdie Shire, MD; diverticulosis, submucosal lipoma of the sigmoid colon.  Marland Kitchen HERNIA REPAIR  2001, 2004  . HERNIA REPAIR  01/22/2013   Repeat repair of umbilical defect, 2.5 cm. Primary repair.  Marland Kitchen NASAL SINUS SURGERY  1997  . rotator cuff surgery  1998  . SQUAMOUS CELL CARCINOMA EXCISION Right 05/2013   shoulder  . TUBAL LIGATION  1976  . UPPER GI ENDOSCOPY  08/22/14   Dr Billie Lade   Family History  Problem Relation Age of Onset  . CVA Father   . Alcohol abuse Father   . Heart disease Mother        myocardial infarction  . Diabetes Mother   . Hearing loss Mother   . Hypertension Brother        x2  . Arthritis Brother   . Hearing loss Brother   . Alcohol abuse Brother   . Arthritis Brother   . Hearing loss Brother   . Arthritis/Rheumatoid Sister        x2  . Breast cancer Sister 48  . Arthritis Sister   . Hearing loss Sister   . Arthritis Sister   . Hearing loss Sister   .  Asthma Sister   . Arthritis Sister   . Hearing loss Sister   . Hypothyroidism Sister   . Arthritis Sister   . Hearing loss Sister   . Cancer Other        breast - paternal first cousin  . Colon cancer Maternal Uncle   . Colon polyps Son   . Colon polyps Sister   . Arthritis Sister   . Hearing loss Sister    Social History   Socioeconomic History  . Marital status: Married    Spouse name: Not on file  . Number of children: 2  . Years of education: Not on file  . Highest education level: Not on file  Occupational History  . Occupation: Retired  Tobacco Use  . Smoking status: Never Smoker  . Smokeless tobacco: Never Used  Vaping Use  . Vaping Use: Never used  Substance and Sexual Activity  . Alcohol use: No    Alcohol/week: 0.0 standard drinks  . Drug use: No  . Sexual activity: Never  Other Topics Concern  . Not on file  Social History Narrative    . Not on file   Social Determinants of Health   Financial Resource Strain:   . Difficulty of Paying Living Expenses:   Food Insecurity:   . Worried About Charity fundraiser in the Last Year:   . Arboriculturist in the Last Year:   Transportation Needs:   . Film/video editor (Medical):   Marland Kitchen Lack of Transportation (Non-Medical):   Physical Activity:   . Days of Exercise per Week:   . Minutes of Exercise per Session:   Stress:   . Feeling of Stress :   Social Connections: Unknown  . Frequency of Communication with Friends and Family: Once a week  . Frequency of Social Gatherings with Friends and Family: Once a week  . Attends Religious Services: Not on file  . Active Member of Clubs or Organizations: Not on file  . Attends Archivist Meetings: Not on file  . Marital Status: Married    Outpatient Encounter Medications as of 03/03/2020  Medication Sig  . albuterol (VENTOLIN HFA) 108 (90 Base) MCG/ACT inhaler USE 2 INHALATIONS EVERY 6 HOURS AS NEEDED FOR WHEEZING OR SHORTNESS OF BREATH  . amLODipine (NORVASC) 5 MG tablet Take 1 tablet (5 mg total) by mouth daily.  . benazepril (LOTENSIN) 40 MG tablet Take 1 tablet (40 mg total) by mouth daily.  . calcium-vitamin D (CALCIUM 500+D) 500-400 MG-UNIT per tablet Take 1 tablet by mouth daily.   Marland Kitchen ezetimibe (ZETIA) 10 MG tablet TAKE 1 TABLET BY MOUTH EVERY DAY  . Fe Fum-FePoly-Vit C-Vit B3 (INTEGRA) 62.5-62.5-40-3 MG CAPS TAKE 1 CAPSULE BY MOUTH EVERY DAY  . fish oil-omega-3 fatty acids 1000 MG capsule 5 (five) times daily.   . fluticasone (FLONASE) 50 MCG/ACT nasal spray Place 2 sprays into both nostrils as needed.  . hydrochlorothiazide (HYDRODIURIL) 25 MG tablet Take 1 tablet (25 mg total) by mouth daily.  . Multiple Vitamin (MULTIVITAMIN) tablet Take 1 tablet by mouth daily.  . niacin (NIASPAN) 1000 MG CR tablet Take 1,000 mg by mouth 2 (two) times daily.  . potassium chloride (KLOR-CON) 10 MEQ tablet Take 1 tablet (10 mEq  total) by mouth daily.  . Probiotic Product (VSL#3 PO) Take by mouth. 1 capsul daily  . triamcinolone cream (KENALOG) 0.1 % Apply 1 application topically as needed.   . [DISCONTINUED] fluticasone (FLONASE) 50 MCG/ACT nasal spray  Place 2 sprays into both nostrils as needed.   No facility-administered encounter medications on file as of 03/03/2020.    Review of Systems  Constitutional: Negative for appetite change and unexpected weight change.  HENT: Negative for congestion and sinus pressure.   Respiratory: Negative for cough, chest tightness and shortness of breath.   Cardiovascular: Negative for chest pain, palpitations and leg swelling.  Gastrointestinal: Negative for abdominal pain, diarrhea, nausea and vomiting.  Genitourinary: Negative for difficulty urinating and dysuria.  Musculoskeletal: Negative for joint swelling and myalgias.  Skin: Negative for color change and rash.  Neurological: Positive for tremors. Negative for dizziness, light-headedness and headaches.  Psychiatric/Behavioral: Negative for agitation and dysphoric mood.       Objective:    Physical Exam Vitals reviewed.  Constitutional:      General: She is not in acute distress.    Appearance: Normal appearance.  HENT:     Head: Normocephalic and atraumatic.     Right Ear: External ear normal.     Left Ear: External ear normal.  Eyes:     General: No scleral icterus.       Right eye: No discharge.        Left eye: No discharge.     Conjunctiva/sclera: Conjunctivae normal.  Neck:     Thyroid: No thyromegaly.  Cardiovascular:     Rate and Rhythm: Normal rate and regular rhythm.  Pulmonary:     Effort: No respiratory distress.     Breath sounds: Normal breath sounds. No wheezing.  Abdominal:     General: Bowel sounds are normal.     Palpations: Abdomen is soft.     Tenderness: There is no abdominal tenderness.  Musculoskeletal:        General: No swelling or tenderness.     Cervical back: Neck supple.  No tenderness.  Lymphadenopathy:     Cervical: No cervical adenopathy.  Skin:    Findings: No erythema or rash.  Neurological:     Mental Status: She is alert.  Psychiatric:        Mood and Affect: Mood normal.        Behavior: Behavior normal.     BP 130/72   Pulse 72   Temp 98.1 F (36.7 C) (Oral)   Resp 16   Ht 5' 2" (1.575 m)   Wt 177 lb (80.3 kg)   LMP 07/21/1988   SpO2 98%   BMI 32.37 kg/m  Wt Readings from Last 3 Encounters:  03/03/20 177 lb (80.3 kg)  12/27/19 177 lb (80.3 kg)  11/29/19 179 lb (81.2 kg)     Lab Results  Component Value Date   WBC 6.8 05/29/2019   HGB 13.2 05/29/2019   HCT 39.2 05/29/2019   PLT 239.0 05/29/2019   GLUCOSE 110 (H) 02/28/2020   CHOL 206 (H) 02/28/2020   TRIG 91.0 02/28/2020   HDL 87.30 02/28/2020   LDLDIRECT 101.4 05/07/2013   LDLCALC 101 (H) 02/28/2020   ALT 21 02/28/2020   AST 25 02/28/2020   NA 133 (L) 03/03/2020   K 3.3 (L) 03/03/2020   CL 99 02/28/2020   CREATININE 0.72 02/28/2020   BUN 12 02/28/2020   CO2 29 02/28/2020   TSH 1.40 02/28/2020   HGBA1C 5.8 02/28/2020   MICROALBUR <0.7 04/19/2018    DG Bone Density  Result Date: 05/02/2019 EXAM: DUAL X-RAY ABSORPTIOMETRY (DXA) FOR BONE MINERAL DENSITY IMPRESSION: crr Your patient Lexington Devine completed a BMD test on 05/02/2019 using the [employer]  Prodigy Advance DXA System (analysis version: 14.10) manufactured by [employer]. The following summarizes the results of our evaluation. PATIENT BIOGRAPHICAL: Name: Deairra, Halleck Patient ID: [id number] Birth Date: 1945/12/14 Height: 62.5 in. Gender: Female Exam Date: 05/02/2019 Weight: 181.8 lbs. Indications: Advanced Age, arthritis, Caucasian, Family Hx of Osteoporosis, Height Loss, History of Fracture (Adult), hx breast ca, Hysterectomy, POSTmenopausal, Rheumatoid Arthritis, skin ca Fractures: lt tib fib Treatments: 81 MG ASPIRIN, calcium /vit d, multivitamin, Vitamin D ASSESSMENT: The BMD measured at Femur Neck Right is  0.791 g/cm2 with a T-score of -1.8. This patient is considered osteopenic according to [facility]) criteria. L-2&3 were excluded due to degenerative changes.The scan quality is good. Site Region Measured Measured WHO Young Adult BMD Date       Age      Classification T-score AP Spine L1-L4 (L2,L3) 4/02/2019 73.7 Normal -0.4 1.117 g/cm2 AP Spine L1-L4 (L2,L3) 12[age]01/2016 71.3 Normal -0.6 1.096 g/cm2 DualFemur Neck Right 05/02/2019 73.7 Osteopenia -1.8 0.791 g/cm2 DualFemur Neck Right 12/01/2016 71.3 Osteopenia -1.3 0.851 g/cm2 DualFemur Total Mean 05/02/2019 73.7 Normal -0.4 0.957 g/cm2 DualFemur Total Mean 12/01/2016 71.3 Normal -0.1 0.995 g/cm2 World Health Organization [facility]) criteria for post-menopausal, Caucasian Women: Normal:       T-score at or above -1 SD Osteopenia:   T-score between -1 and -2.5 SD Osteoporosis: T-score at or below -2.5 SD RECOMMENDATIONS: 1. All patients should optimize calcium and vitamin D intake. 2. Consider FDA-approved medical therapies in postmenopausal women and men aged 36 years and older, based on the following: a. A hip or vertebral (clinical or morphometric) fracture b. T-score < -2.5 at the femoral neck or spine after appropriate evaluation to exclude secondary causes c. Low bone mass (T-score between -1.0 and -2.5 at the femoral neck or spine) and a 10-year probability of a hip fracture > 3% or a 10-year probability of a major osteoporosis-related fracture > 20% based on the [place]-adapted WHO algorithm d. Clinician judgment and/or patient preferences may indicate treatment for people with 10-year fracture probabilities above or below these levels FOLLOW-UP: People with diagnosed cases of osteoporosis or at high risk for fracture should have regular bone mineral density tests. For patients eligible for Medicare, routine testing is allowed once every 2 years. The testing frequency can be increased to one year for patients who have rapidly progressing disease,  those who are receiving or discontinuing medical therapy to restore bone mass, or have additional risk factors. FRAX* RESULTS:  (version: 3.5) 10-year Probability of Fracture1 Major Osteoporotic Fracture2 Hip Fracture 22.0% 4.8% Population: [place] (Caucasian) Risk Factors: History of Fracture (Adult), Rheumatoid Arthritis Based on Femur (Right) Neck BMD 1 -The 10-year probability of fracture may be lower than reported if the patient has received treatment. 2 -Major Osteoporotic Fracture: Clinical Spine, Forearm, Hip or Shoulder *FRAX is a Materials engineer of the [employer] for Metabolic Bone Disease, a [place] (WHO) [facility]. ASSESSMENT: The probability of a major osteoporotic fracture is 22.0% within the next ten years. The probability of a hip fracture is 4.8% within the next ten years. Electronically Signed   By: Marlaine Hind M.D.   On: 05/02/2019 12:37       Assessment & Plan:   Problem List Items Addressed This Visit    Vaginal prolapse    Describes what sounds like bladder wall weakness.  Discussed treatment options - pessary, etc.  Follow.       Umbilical hernia    No  pain to palpation.  Follow.       Tremor of both hands    No shuffling of gait.  FTN intact.  Discussed further w/up.  Follow.        Osteopenia    Was referred bo endocrinology - given 10 year risk of major osteoporotic fracture is >20%.  Unable to take fosamax - did not tolerate.  Continue calcium, vitamin d and weight bearing exercise. Have discussed reclast and prolia.  Follow.       Hypertension    Blood pressure as outlined.  Continue amlodipine, benazepril and hctz.  Follow pressures.  Follow metabolic panel.       Hyperglycemia    Low carb diet and exercise.  Follow met b and a1c.       Hypercholesterolemia    Intolerant to statin medication.  On zetia.  Low cholesterol diet and exercise.  Follow lipid panel.       Electrolyte abnormality     Slightly decreased sodium and potassium.  Recheck soon to confirm wnl.        Relevant Orders   Sodium (Completed)   Potassium (Completed)   Anemia    Follow cbc.       Abnormal liver function test    Diet and exercise.  Follow liver panel.           Einar Pheasant, MD

## 2020-03-04 LAB — POTASSIUM: Potassium: 3.3 mEq/L — ABNORMAL LOW (ref 3.5–5.1)

## 2020-03-04 LAB — SODIUM: Sodium: 133 mEq/L — ABNORMAL LOW (ref 135–145)

## 2020-03-16 ENCOUNTER — Encounter: Payer: Self-pay | Admitting: Internal Medicine

## 2020-03-16 DIAGNOSIS — N8110 Cystocele, unspecified: Secondary | ICD-10-CM | POA: Insufficient documentation

## 2020-03-16 DIAGNOSIS — R251 Tremor, unspecified: Secondary | ICD-10-CM | POA: Insufficient documentation

## 2020-03-16 NOTE — Assessment & Plan Note (Signed)
Slightly decreased sodium and potassium.  Recheck soon to confirm wnl.

## 2020-03-16 NOTE — Assessment & Plan Note (Signed)
Intolerant to statin medication. On zetia.  Low cholesterol diet and exercise.  Follow lipid panel.  

## 2020-03-16 NOTE — Assessment & Plan Note (Signed)
Follow cbc.  

## 2020-03-16 NOTE — Assessment & Plan Note (Signed)
No pain to palpation.  Follow.

## 2020-03-16 NOTE — Assessment & Plan Note (Signed)
No shuffling of gait.  FTN intact.  Discussed further w/up.  Follow.

## 2020-03-16 NOTE — Assessment & Plan Note (Addendum)
Was referred bo endocrinology - given 10 year risk of major osteoporotic fracture is >20%.  Unable to take fosamax - did not tolerate.  Continue calcium, vitamin d and weight bearing exercise. Have discussed reclast and prolia.  Follow.

## 2020-03-16 NOTE — Assessment & Plan Note (Signed)
Blood pressure as outlined.  Continue amlodipine, benazepril and hctz.  Follow pressures.  Follow metabolic panel.

## 2020-03-16 NOTE — Assessment & Plan Note (Signed)
Diet and exercise.  Follow liver panel.   

## 2020-03-16 NOTE — Assessment & Plan Note (Signed)
Describes what sounds like bladder wall weakness.  Discussed treatment options - pessary, etc.  Follow.

## 2020-03-16 NOTE — Assessment & Plan Note (Signed)
Low carb diet and exercise.  Follow met b and a1c.  

## 2020-03-21 ENCOUNTER — Encounter: Payer: Self-pay | Admitting: Internal Medicine

## 2020-03-21 ENCOUNTER — Telehealth: Payer: Self-pay | Admitting: Internal Medicine

## 2020-03-21 DIAGNOSIS — E876 Hypokalemia: Secondary | ICD-10-CM

## 2020-03-21 DIAGNOSIS — N8110 Cystocele, unspecified: Secondary | ICD-10-CM

## 2020-03-21 DIAGNOSIS — E871 Hypo-osmolality and hyponatremia: Secondary | ICD-10-CM

## 2020-03-21 NOTE — Telephone Encounter (Signed)
Patient called about her labs coming up on 03/27/2020. Patient stated that she just had labs done at [facility], does she need to come on Thursday? There are not lab orders in her chart.

## 2020-03-22 ENCOUNTER — Other Ambulatory Visit: Payer: Self-pay | Admitting: Internal Medicine

## 2020-03-22 NOTE — Telephone Encounter (Signed)
Order placed for gyn referral.  

## 2020-03-25 ENCOUNTER — Telehealth: Payer: Self-pay | Admitting: *Deleted

## 2020-03-25 NOTE — Telephone Encounter (Signed)
Patient just had fasting labs done in July. She did not have them done Per last result note, she should be coming in to follow on her electrolytes. Just wanted to be sure that a BMP is all you wanted ordered and also, appears she may need f/u with you to f/u on her bp. I have left message for patient to return my call.

## 2020-03-25 NOTE — Telephone Encounter (Signed)
Please place future orders for lab appt.  

## 2020-03-26 ENCOUNTER — Telehealth: Payer: Self-pay | Admitting: Internal Medicine

## 2020-03-26 MED ORDER — INTEGRA 62.5-62.5-40-3 MG PO CAPS: 1.0000 | ORAL_CAPSULE | Freq: Every day | ORAL | 0 refills | Status: DC

## 2020-03-26 NOTE — Telephone Encounter (Signed)
Order placed for f/u lab - met b

## 2020-03-26 NOTE — Telephone Encounter (Signed)
Order placed for met b - to f/u on low sodium and potassium - since decreasing hctz.  Does need a f/u appt with me - to f/u on her blood pressure.  I do not see lab results from [place].  Need results if had drawn.  (reviewed my labs from July).

## 2020-03-26 NOTE — Telephone Encounter (Signed)
Patient is requesting a refill on her iron medication.

## 2020-03-27 ENCOUNTER — Other Ambulatory Visit: Payer: Self-pay

## 2020-03-27 ENCOUNTER — Telehealth: Payer: Self-pay | Admitting: Internal Medicine

## 2020-03-27 ENCOUNTER — Other Ambulatory Visit (INDEPENDENT_AMBULATORY_CARE_PROVIDER_SITE_OTHER): Payer: Medicare Other

## 2020-03-27 ENCOUNTER — Other Ambulatory Visit: Payer: Medicare Other

## 2020-03-27 DIAGNOSIS — E871 Hypo-osmolality and hyponatremia: Secondary | ICD-10-CM | POA: Diagnosis not present

## 2020-03-27 DIAGNOSIS — E876 Hypokalemia: Secondary | ICD-10-CM

## 2020-03-27 LAB — BASIC METABOLIC PANEL
BUN: 12 mg/dL (ref 6–23)
CO2: 32 mEq/L (ref 19–32)
Calcium: 9.9 mg/dL (ref 8.4–10.5)
Chloride: 101 mEq/L (ref 96–112)
Creatinine, Ser: 0.79 mg/dL (ref 0.40–1.20)
GFR: 71.01 mL/min (ref >60.00)
Glucose, Bld: 104 mg/dL — ABNORMAL HIGH (ref 70–99)
Potassium: 3.9 mEq/L (ref 3.5–5.1)
Sodium: 139 mEq/L (ref 135–145)

## 2020-03-27 NOTE — Telephone Encounter (Signed)
Patient dropped off BP readings for Dr. Nicki Reaper. Readings are up front in Dr. Bary Leriche color folder.

## 2020-03-27 NOTE — Telephone Encounter (Signed)
See other note

## 2020-03-28 NOTE — Telephone Encounter (Signed)
I gave you her bp readings. Will schedule her for an appt with you when she calls back. Per chart, she did not have labs done at [facility].

## 2020-03-28 NOTE — Telephone Encounter (Signed)
See result note for documentation

## 2020-03-28 NOTE — Telephone Encounter (Signed)
Reviewed blood pressures. Overall look good.  Continue current medications.  Will continue to follow.

## 2020-04-10 ENCOUNTER — Ambulatory Visit: Payer: Medicare Other | Admitting: Internal Medicine

## 2020-04-10 ENCOUNTER — Other Ambulatory Visit: Payer: Self-pay

## 2020-04-10 ENCOUNTER — Encounter: Payer: Self-pay | Admitting: Internal Medicine

## 2020-04-10 DIAGNOSIS — M8580 Other specified disorders of bone density and structure, unspecified site: Secondary | ICD-10-CM

## 2020-04-10 DIAGNOSIS — I10 Essential (primary) hypertension: Secondary | ICD-10-CM

## 2020-04-10 DIAGNOSIS — N8110 Cystocele, unspecified: Secondary | ICD-10-CM | POA: Diagnosis not present

## 2020-04-10 DIAGNOSIS — D0512 Intraductal carcinoma in situ of left breast: Secondary | ICD-10-CM | POA: Diagnosis not present

## 2020-04-10 DIAGNOSIS — E7800 Pure hypercholesterolemia, unspecified: Secondary | ICD-10-CM

## 2020-04-10 DIAGNOSIS — R739 Hyperglycemia, unspecified: Secondary | ICD-10-CM

## 2020-04-10 DIAGNOSIS — D649 Anemia, unspecified: Secondary | ICD-10-CM

## 2020-04-10 MED ORDER — HYDROCHLOROTHIAZIDE 12.5 MG PO CAPS: 12.5000 mg | ORAL_CAPSULE | Freq: Every day | ORAL | 2 refills | Status: DC

## 2020-04-10 NOTE — Progress Notes (Signed)
Patient ID: Helen Marshall, female   DOB: [birth year], 74 y.o.   MRN: [id number]   Subjective:    Patient ID: Helen Marshall, female    DOB: March 05, 1946, 74 y.o.   MRN: [id number]  HPI This visit occurred during the SARS-CoV-2 public health emergency.  Safety protocols were in place, including screening questions prior to the visit, additional usage of staff PPE, and extensive cleaning of exam room while observing appropriate contact time as indicated for disinfecting solutions.  Patient here for a scheduled follow up.  She reports increased stress.  Discussed with her today.  Overall handling things.  Does not feel needs any further intervention at this time.  Tries to stay active.  No chest pain or increased sob reported.  No abdominal pain.  Bowels stable.  Discussed reclast.  Having issues with prolapse/ "bladder bulge".  No increased pain.  Discussed gyn referral.  Bunion foot.  Discussed checking B12.   Past Medical History:  Diagnosis Date  . Abnormal LFTs   . Abnormal mammogram 12/19/2012   left  . Allergy   . Anemia   . Cancer [facility]) 2014   left Breast  . Carotid bruit    left  . Cataract   . Colon polyps   . Diverticulosis   . Diverticulosis 2012  . Environmental allergies   . GERD (gastroesophageal reflux disease)   . Heart murmur   . Hypercholesterolemia   . Hyperglycemia   . Hypertension   . IBS (irritable bowel syndrome)   . Lipoma of colon   . Lumbar disc disease   . 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  . Tubular adenoma of colon    Past Surgical History:  Procedure Laterality Date  . ABDOMINAL HYSTERECTOMY  1989   fibroids  . ANKLE SURGERY Left 1999  . BREAST SURGERY Left 1970   biopsy  . CATARACT EXTRACTION, BILATERAL  June & July 2017  .  CHOLECYSTECTOMY  2001  . COLONOSCOPY  06/02/2011   Verdie Shire, MD; diverticulosis, submucosal lipoma of the sigmoid colon.  Marland Kitchen HERNIA REPAIR  2001, 2004  . HERNIA REPAIR  01/22/2013   Repeat repair of umbilical defect, 2.5 cm. Primary repair.  Marland Kitchen NASAL SINUS SURGERY  1997  . rotator cuff surgery  1998  . SQUAMOUS CELL CARCINOMA EXCISION Right 05/2013   shoulder  . TUBAL LIGATION  1976  . UPPER GI ENDOSCOPY  08/22/14   Dr Billie Lade   Family History  Problem Relation Age of Onset  . CVA Father   . Alcohol abuse Father   . Heart disease Mother        myocardial infarction  . Diabetes Mother   . Hearing loss Mother   . Hypertension Brother        x2  . Arthritis Brother   . Hearing loss Brother   . Alcohol abuse Brother   . Arthritis Brother   . Hearing loss Brother   . Arthritis/Rheumatoid Sister        x2  . Breast cancer Sister 78  . Arthritis Sister   . Hearing loss Sister   . Arthritis Sister   . Hearing loss Sister   . Asthma Sister   . Arthritis Sister   . Hearing loss Sister   . Hypothyroidism Sister   .  Arthritis Sister   . Hearing loss Sister   . Cancer Other        breast - paternal first cousin  . Colon cancer Maternal Uncle   . Colon polyps Son   . Colon polyps Sister   . Arthritis Sister   . Hearing loss Sister    Social History   Socioeconomic History  . Marital status: Married    Spouse name: Not on file  . Number of children: 2  . Years of education: Not on file  . Highest education level: Not on file  Occupational History  . Occupation: Retired  Tobacco Use  . Smoking status: Never Smoker  . Smokeless tobacco: Never Used  Vaping Use  . Vaping Use: Never used  Substance and Sexual Activity  . Alcohol use: No    Alcohol/week: 0.0 standard drinks  . Drug use: No  . Sexual activity: Never  Other Topics Concern  . Not on file  Social History Narrative  . Not on file   Social Determinants of Health   Financial Resource Strain:   .  Difficulty of Paying Living Expenses: Not on file  Food Insecurity:   . Worried About Charity fundraiser in the Last Year: Not on file  . Ran Out of Food in the Last Year: Not on file  Transportation Needs:   . Lack of Transportation (Medical): Not on file  . Lack of Transportation (Non-Medical): Not on file  Physical Activity:   . Days of Exercise per Week: Not on file  . Minutes of Exercise per Session: Not on file  Stress:   . Feeling of Stress : Not on file  Social Connections: Unknown  . Frequency of Communication with Friends and Family: Once a week  . Frequency of Social Gatherings with Friends and Family: Once a week  . Attends Religious Services: Not on file  . Active Member of Clubs or Organizations: Not on file  . Attends Archivist Meetings: Not on file  . Marital Status: Married    Outpatient Encounter Medications as of 04/10/2020  Medication Sig  . albuterol (VENTOLIN HFA) 108 (90 Base) MCG/ACT inhaler USE 2 INHALATIONS EVERY 6 HOURS AS NEEDED FOR WHEEZING OR SHORTNESS OF BREATH  . amLODipine (NORVASC) 5 MG tablet Take 1 tablet (5 mg total) by mouth daily.  . benazepril (LOTENSIN) 40 MG tablet Take 1 tablet (40 mg total) by mouth daily.  . calcium-vitamin D (CALCIUM 500+D) 500-400 MG-UNIT per tablet Take 1 tablet by mouth daily.   Marland Kitchen ezetimibe (ZETIA) 10 MG tablet TAKE 1 TABLET BY MOUTH EVERY DAY  . Fe Fum-FePoly-Vit C-Vit B3 (INTEGRA) 62.5-62.5-40-3 MG CAPS Take 1 tablet by mouth daily.  . fish oil-omega-3 fatty acids 1000 MG capsule 5 (five) times daily.   . fluticasone (FLONASE) 50 MCG/ACT nasal spray Place 2 sprays into both nostrils as needed.  . Multiple Vitamin (MULTIVITAMIN) tablet Take 1 tablet by mouth daily.  . niacin (NIASPAN) 1000 MG CR tablet Take 1,000 mg by mouth 2 (two) times daily.  . potassium chloride (KLOR-CON) 10 MEQ tablet Take 1 tablet (10 mEq total) by mouth daily.  . Probiotic Product (VSL#3 PO) Take by mouth. 1 capsul daily  .  triamcinolone cream (KENALOG) 0.1 % Apply 1 application topically as needed.   . [DISCONTINUED] hydrochlorothiazide (HYDRODIURIL) 25 MG tablet Take 1 tablet (25 mg total) by mouth daily. (Patient taking differently: Take 12.5 mg by mouth daily. )  . hydrochlorothiazide (MICROZIDE) 12.5  MG capsule Take 1 capsule (12.5 mg total) by mouth daily.   No facility-administered encounter medications on file as of 04/10/2020.    Review of Systems  Constitutional: Negative for appetite change and unexpected weight change.  HENT: Negative for congestion and sinus pressure.   Respiratory: Negative for cough, chest tightness and shortness of breath.   Cardiovascular: Negative for chest pain, palpitations and leg swelling.  Gastrointestinal: Negative for abdominal pain, diarrhea, nausea and vomiting.  Genitourinary: Negative for difficulty urinating and dysuria.  Musculoskeletal: Negative for joint swelling and myalgias.  Skin: Negative for color change and rash.  Neurological: Negative for dizziness, light-headedness and headaches.  Psychiatric/Behavioral: Negative for agitation and dysphoric mood.       Objective:    Physical Exam  BP 130/76   Pulse 81   Temp 97.8 F (36.6 C)   Resp 16   Ht '5\' 2"'  (1.575 m)   Wt 176 lb (79.8 kg)   LMP 07/21/1988   SpO2 99%   BMI 32.19 kg/m  Wt Readings from Last 3 Encounters:  04/10/20 176 lb (79.8 kg)  03/03/20 177 lb (80.3 kg)  12/27/19 177 lb (80.3 kg)     Lab Results  Component Value Date   WBC 6.8 05/29/2019   HGB 13.2 05/29/2019   HCT 39.2 05/29/2019   PLT 239.0 05/29/2019   GLUCOSE 104 (H) 03/27/2020   CHOL 206 (H) 02/28/2020   TRIG 91.0 02/28/2020   HDL 87.30 02/28/2020   LDLDIRECT 101.4 05/07/2013   LDLCALC 101 (H) 02/28/2020   ALT 21 02/28/2020   AST 25 02/28/2020   NA 139 03/27/2020   K 3.9 03/27/2020   CL 101 03/27/2020   CREATININE 0.79 03/27/2020   BUN 12 03/27/2020   CO2 32 03/27/2020   TSH 1.40 02/28/2020   HGBA1C 5.8  02/28/2020   MICROALBUR <0.7 04/19/2018    DG Bone Density  Result Date: 05/02/2019 EXAM: DUAL X-RAY ABSORPTIOMETRY (DXA) FOR BONE MINERAL DENSITY IMPRESSION: crr Your patient Cortana Vanderford completed a BMD test on 05/02/2019 using the Ravia (analysis version: 14.10) manufactured by [employer]. The following summarizes the results of our evaluation. PATIENT BIOGRAPHICAL: Name: Oaklynn, Stierwalt Patient ID: [id number] Birth Date: July 01, 1946 Height: 62.5 in. Gender: Female Exam Date: 05/02/2019 Weight: 181.8 lbs. Indications: Advanced Age, arthritis, Caucasian, Family Hx of Osteoporosis, Height Loss, History of Fracture (Adult), hx breast ca, Hysterectomy, POSTmenopausal, Rheumatoid Arthritis, skin ca Fractures: lt tib fib Treatments: 81 MG ASPIRIN, calcium /vit d, multivitamin, Vitamin D ASSESSMENT: The BMD measured at Femur Neck Right is 0.791 g/cm2 with a T-score of -1.8. This patient is considered osteopenic according to [facility]) criteria. L-2&3 were excluded due to degenerative changes.The scan quality is good. Site Region Measured Measured WHO Young Adult BMD Date       Age      Classification T-score AP Spine L1-L4 (L2,L3) 05/02/2019 73.7 Normal -0.4 1.117 g/cm2 AP Spine L1-L4 (L2,L3) 12[age]01/2016 71.3 Normal -0.6 1.096 g/cm2 DualFemur Neck Right 05/02/2019 73.7 Osteopenia -1.8 0.791 g/cm2 DualFemur Neck Right 12/01/2016 71.3 Osteopenia -1.3 0.851 g/cm2 DualFemur Total Mean 05/02/2019 73.7 Normal -0.4 0.957 g/cm2 DualFemur Total Mean 12/01/2016 71.3 Normal -0.1 0.995 g/cm2 World Health Organization [facility]) criteria for post-menopausal, Caucasian Women: Normal:       T-score at or above -1 SD Osteopenia:   T-score between -1 and -2.5 SD Osteoporosis: T-score at or below -2.5 SD RECOMMENDATIONS: 1. All patients should optimize calcium and vitamin D  intake. 2. Consider FDA-approved medical therapies in postmenopausal women and men aged 26 years and older, based on  the following: a. A hip or vertebral (clinical or morphometric) fracture b. T-score < -2.5 at the femoral neck or spine after appropriate evaluation to exclude secondary causes c. Low bone mass (T-score between -1.0 and -2.5 at the femoral neck or spine) and a 10-year probability of a hip fracture > 3% or a 10-year probability of a major osteoporosis-related fracture > 20% based on the [place]-adapted WHO algorithm d. Clinician judgment and/or patient preferences may indicate treatment for people with 10-year fracture probabilities above or below these levels FOLLOW-UP: People with diagnosed cases of osteoporosis or at high risk for fracture should have regular bone mineral density tests. For patients eligible for Medicare, routine testing is allowed once every 2 years. The testing frequency can be increased to one year for patients who have rapidly progressing disease, those who are receiving or discontinuing medical therapy to restore bone mass, or have additional risk factors. FRAX* RESULTS:  (version: 3.5) 10-year Probability of Fracture1 Major Osteoporotic Fracture2 Hip Fracture 22.0% 4.8% Population: [place] (Caucasian) Risk Factors: History of Fracture (Adult), Rheumatoid Arthritis Based on Femur (Right) Neck BMD 1 -The 10-year probability of fracture may be lower than reported if the patient has received treatment. 2 -Major Osteoporotic Fracture: Clinical Spine, Forearm, Hip or Shoulder *FRAX is a Materials engineer of the [employer] for Metabolic Bone Disease, a [place] (WHO) [facility]. ASSESSMENT: The probability of a major osteoporotic fracture is 22.0% within the next ten years. The probability of a hip fracture is 4.8% within the next ten years. Electronically Signed   By: Marlaine Hind M.D.   On: 05/02/2019 12:37       Assessment & Plan:   Problem List Items Addressed This Visit    Vaginal prolapse    Discussed gyn referral.  See previous note.         Osteopenia    Given 10 year risk of major osteoporotic fracture is >20%.  Unable to take fosamax.  Referred to endocrinology.  Since unable to tolerate fosamax, refer back for reclast.  Continue calcium, vitamin D and weight bearing exercise.        Neoplasm of left breast, primary tumor 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)    Followed by Dr Terri Piedra as outlined.  Mammogram 09/17/19 -ok.  Recommended f/u in one year.       Hypertension    Blood pressure as outlined.  Continue amlodipine, benazepril and hctz.  Follow pressures.  Follow metabolic panel.       Relevant Medications   hydrochlorothiazide (MICROZIDE) 12.5 MG capsule   Other Relevant Orders   Basic metabolic panel   Hyperglycemia    Low carb diet and exercise. Follow met b and a1c.       Relevant Orders   Hemoglobin A1c   Hypercholesterolemia    Intolerant to statin medication.  On zetial.  Low cholesterol diet and exercise.  Follow lipid panel.       Relevant Medications   hydrochlorothiazide (MICROZIDE) 12.5 MG capsule   Other Relevant Orders   Lipid panel   Hepatic function panel   Anemia    Follow cbc.           Einar Pheasant, MD

## 2020-04-20 ENCOUNTER — Encounter: Payer: Self-pay | Admitting: Internal Medicine

## 2020-04-20 NOTE — Assessment & Plan Note (Signed)
Blood pressure as outlined.  Continue amlodipine, benazepril and hctz.  Follow pressures.  Follow metabolic panel.

## 2020-04-20 NOTE — Assessment & Plan Note (Signed)
Low carb diet and exercise. Follow met b and a1c.  

## 2020-04-20 NOTE — Assessment & Plan Note (Signed)
Intolerant to statin medication.  On zetial.  Low cholesterol diet and exercise.  Follow lipid panel.

## 2020-04-20 NOTE — Assessment & Plan Note (Signed)
Discussed gyn referral.  See previous note.

## 2020-04-20 NOTE — Assessment & Plan Note (Signed)
Given 10 year risk of major osteoporotic fracture is >20%.  Unable to take fosamax.  Referred to endocrinology.  Since unable to tolerate fosamax, refer back for reclast.  Continue calcium, vitamin D and weight bearing exercise.

## 2020-04-20 NOTE — Assessment & Plan Note (Signed)
Followed by Dr Terri Piedra as outlined.  Mammogram 09/17/19 -ok.  Recommended f/u in one year.

## 2020-04-20 NOTE — Assessment & Plan Note (Signed)
Follow cbc.  

## 2020-07-15 ENCOUNTER — Other Ambulatory Visit: Payer: Self-pay

## 2020-07-15 ENCOUNTER — Other Ambulatory Visit (INDEPENDENT_AMBULATORY_CARE_PROVIDER_SITE_OTHER): Payer: Medicare Other

## 2020-07-15 DIAGNOSIS — E7800 Pure hypercholesterolemia, unspecified: Secondary | ICD-10-CM | POA: Diagnosis not present

## 2020-07-15 DIAGNOSIS — R739 Hyperglycemia, unspecified: Secondary | ICD-10-CM

## 2020-07-15 DIAGNOSIS — Z23 Encounter for immunization: Secondary | ICD-10-CM | POA: Diagnosis not present

## 2020-07-15 DIAGNOSIS — I10 Essential (primary) hypertension: Secondary | ICD-10-CM

## 2020-07-15 LAB — LIPID PANEL
Cholesterol: 199 mg/dL (ref 0–200)
HDL: 64.5 mg/dL (ref >39.00)
LDL Cholesterol: 104 mg/dL — ABNORMAL HIGH (ref 0–99)
NonHDL: 134.42
Total CHOL/HDL Ratio: 3
Triglycerides: 151 mg/dL — ABNORMAL HIGH (ref 0.0–149.0)
VLDL: 30.2 mg/dL (ref 0.0–40.0)

## 2020-07-15 LAB — HEPATIC FUNCTION PANEL
ALT: 19 U/L (ref 0–35)
AST: 21 U/L (ref 0–37)
Albumin: 4 g/dL (ref 3.5–5.2)
Alkaline Phosphatase: 58 U/L (ref 39–117)
Bilirubin, Direct: 0.1 mg/dL (ref 0.0–0.3)
Total Bilirubin: 0.5 mg/dL (ref 0.2–1.2)
Total Protein: 6.4 g/dL (ref 6.0–8.3)

## 2020-07-15 LAB — BASIC METABOLIC PANEL
BUN: 13 mg/dL (ref 6–23)
CO2: 30 mEq/L (ref 19–32)
Calcium: 9 mg/dL (ref 8.4–10.5)
Chloride: 102 mEq/L (ref 96–112)
Creatinine, Ser: 0.67 mg/dL (ref 0.40–1.20)
GFR: 85.77 mL/min (ref >60.00)
Glucose, Bld: 101 mg/dL — ABNORMAL HIGH (ref 70–99)
Potassium: 4 mEq/L (ref 3.5–5.1)
Sodium: 138 mEq/L (ref 135–145)

## 2020-07-15 LAB — HEMOGLOBIN A1C: Hgb A1c MFr Bld: 5.7 % (ref 4.6–6.5)

## 2020-07-17 ENCOUNTER — Ambulatory Visit (INDEPENDENT_AMBULATORY_CARE_PROVIDER_SITE_OTHER): Payer: Medicare Other | Admitting: Internal Medicine

## 2020-07-17 ENCOUNTER — Other Ambulatory Visit: Payer: Self-pay

## 2020-07-17 DIAGNOSIS — D0512 Intraductal carcinoma in situ of left breast: Secondary | ICD-10-CM

## 2020-07-17 DIAGNOSIS — R7989 Other specified abnormal findings of blood chemistry: Secondary | ICD-10-CM

## 2020-07-17 DIAGNOSIS — E7800 Pure hypercholesterolemia, unspecified: Secondary | ICD-10-CM

## 2020-07-17 DIAGNOSIS — D649 Anemia, unspecified: Secondary | ICD-10-CM

## 2020-07-17 DIAGNOSIS — R739 Hyperglycemia, unspecified: Secondary | ICD-10-CM | POA: Diagnosis not present

## 2020-07-17 DIAGNOSIS — N8110 Cystocele, unspecified: Secondary | ICD-10-CM

## 2020-07-17 DIAGNOSIS — Z0000 Encounter for general adult medical examination without abnormal findings: Secondary | ICD-10-CM | POA: Diagnosis not present

## 2020-07-17 DIAGNOSIS — I10 Essential (primary) hypertension: Secondary | ICD-10-CM

## 2020-07-17 DIAGNOSIS — R945 Abnormal results of liver function studies: Secondary | ICD-10-CM

## 2020-07-17 DIAGNOSIS — M549 Dorsalgia, unspecified: Secondary | ICD-10-CM

## 2020-07-17 DIAGNOSIS — Z9109 Other allergy status, other than to drugs and biological substances: Secondary | ICD-10-CM

## 2020-07-17 MED ORDER — TIZANIDINE HCL 4 MG PO TABS: ORAL_TABLET | ORAL | 0 refills | Status: DC

## 2020-07-17 MED ORDER — AMLODIPINE BESYLATE 5 MG PO TABS
5.0000 mg | ORAL_TABLET | Freq: Every day | ORAL | 3 refills | Status: DC
Start: 2020-07-17 — End: 2020-09-08

## 2020-07-17 NOTE — Progress Notes (Signed)
Patient ID: Helen Marshall, female   DOB: 1945-11-13, 74 y.o.   MRN: [id number]   Subjective:    Patient ID: Helen Marshall, female    DOB: 08/28/1945, 74 y.o.   MRN: [id number]  HPI This visit occurred during the SARS-CoV-2 public health emergency.  Safety protocols were in place, including screening questions prior to the visit, additional usage of staff PPE, and extensive cleaning of exam room while observing appropriate contact time as indicated for disinfecting solutions.  Patient here for her physical exam.  She reports she has been doing relatively well.  She did report that she had worked in her yard - raking leaves, carrying groceries, Social research officer, government.  Noticed pain - right side to back. (muscle spasm in back).  Took tylenol this am and heating pad.  Has improved some.  No chest pain or sob reported.  No abdominal pain or bowel change reported.  Saw Dr Leafy Ro for pessary evaluation 05/26/20.    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   Tubular adenoma of colon    Past Surgical History:  Procedure Laterality Date   ABDOMINAL HYSTERECTOMY  1989   fibroids   ANKLE SURGERY Left 1999   BREAST SURGERY Left 1970   biopsy   CATARACT EXTRACTION, BILATERAL  June & July 2017   CHOLECYSTECTOMY  2001    COLONOSCOPY  06/02/2011   Verdie Shire, MD; diverticulosis, submucosal lipoma of the sigmoid colon.   HERNIA REPAIR  2001, 2004   HERNIA REPAIR  01/22/2013   Repeat repair of umbilical defect, 2.5 cm. Primary repair.   NASAL SINUS SURGERY  1997   rotator cuff surgery  1998   SQUAMOUS CELL CARCINOMA EXCISION Right 05/2013   shoulder   TUBAL LIGATION  1976   UPPER GI ENDOSCOPY  08/22/14   Dr Billie Lade   Family History  Problem Relation Age of Onset   CVA Father    Alcohol abuse Father    Heart disease Mother        myocardial infarction   Diabetes Mother    Hearing loss Mother    Hypertension Brother        x2   Arthritis Brother    Hearing loss Brother    Alcohol abuse Brother    Arthritis Brother    Hearing loss Brother    Arthritis/Rheumatoid Sister        x2   Breast cancer Sister 57   Arthritis Sister    Hearing loss Sister    Arthritis Sister    Hearing loss Sister    Asthma Sister    Arthritis Sister    Hearing loss Sister  Hypothyroidism Sister    Arthritis Sister    Hearing loss Sister    Cancer Other        breast - paternal first cousin   Colon cancer Maternal Uncle    Colon polyps Son    Colon polyps Sister    Arthritis Sister    Hearing loss Sister    Social History   Socioeconomic History   Marital status: Married    Spouse name: Not on file   Number of children: 2   Years of education: Not on file   Highest education level: Not on file  Occupational History   Occupation: Retired  Tobacco Use   Smoking status: Never Smoker   Smokeless tobacco: Never Used  Scientific laboratory technician Use: Never used  Substance and Sexual Activity   Alcohol use: No    Alcohol/week: 0.0 standard drinks   Drug use: No   Sexual activity: Never  Other Topics Concern   Not on file  Social History Narrative   Not on file   Social Determinants of Health   Financial Resource Strain: Not on file  Food Insecurity:  Not on file  Transportation Needs: Not on file  Physical Activity: Not on file  Stress: Not on file  Social Connections: Unknown   Frequency of Communication with Friends and Family: Once a week   Frequency of Social Gatherings with Friends and Family: Once a week   Attends Religious Services: Not on Higher education careers adviser of Clubs or Organizations: Not on file   Attends Archivist Meetings: Not on file   Marital Status: Married    Outpatient Encounter Medications as of 07/17/2020  Medication Sig   albuterol (VENTOLIN HFA) 108 (90 Base) MCG/ACT inhaler USE 2 INHALATIONS EVERY 6 HOURS AS NEEDED FOR WHEEZING OR SHORTNESS OF BREATH   amLODipine (NORVASC) 5 MG tablet Take 1 tablet (5 mg total) by mouth daily.   benazepril (LOTENSIN) 40 MG tablet Take 1 tablet (40 mg total) by mouth daily.   calcium-vitamin D (CALCIUM 500+D) 500-400 MG-UNIT per tablet Take 1 tablet by mouth daily.    ezetimibe (ZETIA) 10 MG tablet TAKE 1 TABLET BY MOUTH EVERY DAY   Fe Fum-FePoly-Vit C-Vit B3 (INTEGRA) 62.5-62.5-40-3 MG CAPS Take 1 tablet by mouth daily.   fish oil-omega-3 fatty acids 1000 MG capsule 5 (five) times daily.    fluticasone (FLONASE) 50 MCG/ACT nasal spray Place 2 sprays into both nostrils as needed.   hydrochlorothiazide (MICROZIDE) 12.5 MG capsule Take 1 capsule (12.5 mg total) by mouth daily.   Multiple Vitamin (MULTIVITAMIN) tablet Take 1 tablet by mouth daily.   niacin (NIASPAN) 1000 MG CR tablet Take 1,000 mg by mouth 2 (two) times daily.   potassium chloride (KLOR-CON) 10 MEQ tablet Take 1 tablet (10 mEq total) by mouth daily.   Probiotic Product (VSL#3 PO) Take by mouth. 1 capsul daily   tiZANidine (ZANAFLEX) 4 MG tablet Take one tablet q hs prn   triamcinolone cream (KENALOG) 0.1 % Apply 1 application topically as needed.    [DISCONTINUED] amLODipine (NORVASC) 5 MG tablet Take 1 tablet (5 mg total) by mouth daily.   No facility-administered encounter  medications on file as of 07/17/2020.    Review of Systems  Constitutional: Negative for appetite change and unexpected weight change.  HENT: Negative for congestion, sinus pressure and sore throat.   Eyes: Negative for pain and visual disturbance.  Respiratory: Negative for cough, chest tightness and shortness of  breath.   Cardiovascular: Negative for chest pain, palpitations and leg swelling.  Gastrointestinal: Negative for abdominal pain, diarrhea, nausea and vomiting.  Genitourinary: Negative for difficulty urinating and dysuria.  Musculoskeletal: Negative for joint swelling and myalgias.  Skin: Negative for color change and rash.  Neurological: Negative for dizziness, light-headedness and headaches.  Hematological: Negative for adenopathy. Does not bruise/bleed easily.  Psychiatric/Behavioral: Negative for agitation and decreased concentration.       Objective:    Physical Exam Vitals reviewed.  Constitutional:      General: She is not in acute distress.    Appearance: Normal appearance.  HENT:     Head: Normocephalic and atraumatic.     Right Ear: External ear normal.     Left Ear: External ear normal.     Mouth/Throat:     Mouth: Oropharynx is clear and moist.  Eyes:     General: No scleral icterus.       Right eye: No discharge.        Left eye: No discharge.     Conjunctiva/sclera: Conjunctivae normal.  Neck:     Thyroid: No thyromegaly.  Cardiovascular:     Rate and Rhythm: Normal rate and regular rhythm.  Pulmonary:     Effort: No respiratory distress.     Breath sounds: Normal breath sounds. No wheezing.     Comments: Breasts:  Palpable fullness 2:00 left breast.  No nipple discharge or nipple retraction present.  No other nodules or axillary adenopathy.  Abdominal:     General: Bowel sounds are normal.     Palpations: Abdomen is soft.     Tenderness: There is no abdominal tenderness.  Musculoskeletal:        General: No swelling, tenderness or edema.      Cervical back: Neck supple. No tenderness.  Lymphadenopathy:     Cervical: No cervical adenopathy.  Skin:    Findings: No erythema or rash.  Neurological:     Mental Status: She is alert.  Psychiatric:        Mood and Affect: Mood normal.        Behavior: Behavior normal.     BP 128/68    Pulse 86    Temp (!) 97.4 F (36.3 C) (Oral)    Resp 16    Ht '5\' 2"'  (1.575 m)    Wt 174 lb (78.9 kg)    LMP 07/21/1988    SpO2 98%    BMI 31.83 kg/m  Wt Readings from Last 3 Encounters:  07/17/20 174 lb (78.9 kg)  04/10/20 176 lb (79.8 kg)  03/03/20 177 lb (80.3 kg)     Lab Results  Component Value Date   WBC 6.8 05/29/2019   HGB 13.2 05/29/2019   HCT 39.2 05/29/2019   PLT 239.0 05/29/2019   GLUCOSE 101 (H) 07/15/2020   CHOL 199 07/15/2020   TRIG 151.0 (H) 07/15/2020   HDL 64.50 07/15/2020   LDLDIRECT 101.4 05/07/2013   LDLCALC 104 (H) 07/15/2020   ALT 19 07/15/2020   AST 21 07/15/2020   NA 138 07/15/2020   K 4.0 07/15/2020   CL 102 07/15/2020   CREATININE 0.67 07/15/2020   BUN 13 07/15/2020   CO2 30 07/15/2020   TSH 1.40 02/28/2020   HGBA1C 5.7 07/15/2020   MICROALBUR <0.7 04/19/2018    DG Bone Density  Result Date: 05/02/2019 EXAM: DUAL X-RAY ABSORPTIOMETRY (DXA) FOR BONE MINERAL DENSITY IMPRESSION: crr Your patient Kelsy Polack completed a BMD test on 05/02/2019 using  the [place] (analysis version: 14.10) manufactured by [employer]. The following summarizes the results of our evaluation. PATIENT BIOGRAPHICAL: Name: Erum, Cercone Patient ID: [id number] Birth Date: 1946/06/18 Height: 62.5 in. Gender: Female Exam Date: 05/02/2019 Weight: 181.8 lbs. Indications: Advanced Age, arthritis, Caucasian, Family Hx of Osteoporosis, Height Loss, History of Fracture (Adult), hx breast ca, Hysterectomy, POSTmenopausal, Rheumatoid Arthritis, skin ca Fractures: lt tib fib Treatments: 81 MG ASPIRIN, calcium /vit d, multivitamin, Vitamin D ASSESSMENT: The BMD measured  at Femur Neck Right is 0.791 g/cm2 with a T-score of -1.8. This patient is considered osteopenic according to [facility]) criteria. L-2&3 were excluded due to degenerative changes.The scan quality is good. Site Region Measured Measured WHO Young Adult BMD Date       Age      Classification T-score AP Spine L1-L4 (L2,L3) 05/02/2019 73.7 Normal -0.4 1.117 g/cm2 AP Spine L1-L4 (L2,L3) 12/01/2016 71.3 Normal -0.6 1.096 g/cm2 DualFemur Neck Right 05/02/2019 73.7 Osteopenia -1.8 0.791 g/cm2 DualFemur Neck Right 12/01/2016 71.3 Osteopenia -1.3 0.851 g/cm2 DualFemur Total Mean 05/02/2019 73.7 Normal -0.4 0.957 g/cm2 DualFemur Total Mean 12/01/2016 71.3 Normal -0.1 0.995 g/cm2 World Health Organization [facility]) criteria for post-menopausal, Caucasian Women: Normal:       T-score at or above -1 SD Osteopenia:   T-score between -1 and -2.5 SD Osteoporosis: T-score at or below -2.5 SD RECOMMENDATIONS: 1. All patients should optimize calcium and vitamin D intake. 2. Consider FDA-approved medical therapies in postmenopausal women and men aged 26 years and older, based on the following: a. A hip or vertebral (clinical or morphometric) fracture b. T-score < -2.5 at the femoral neck or spine after appropriate evaluation to exclude secondary causes c. Low bone mass (T-score between -1.0 and -2.5 at the femoral neck or spine) and a 10-year probability of a hip fracture > 3% or a 10-year probability of a major osteoporosis-related fracture > 20% based on the [place]-adapted WHO algorithm d. Clinician judgment and/or patient preferences may indicate treatment for people with 10-year fracture probabilities above or below these levels FOLLOW-UP: People with diagnosed cases of osteoporosis or at high risk for fracture should have regular bone mineral density tests. For patients eligible for Medicare, routine testing is allowed once every 2 years. The testing frequency can be increased to one year for patients who have rapidly  progressing disease, those who are receiving or discontinuing medical therapy to restore bone mass, or have additional risk factors. FRAX* RESULTS:  (version: 3.5) 10-year Probability of Fracture1 Major Osteoporotic Fracture2 Hip Fracture 22.0% 4.8% Population: [place] (Caucasian) Risk Factors: History of Fracture (Adult), Rheumatoid Arthritis Based on Femur (Right) Neck BMD 1 -The 10-year probability of fracture may be lower than reported if the patient has received treatment. 2 -Major Osteoporotic Fracture: Clinical Spine, Forearm, Hip or Shoulder *FRAX is a Materials engineer of the [employer] for Metabolic Bone Disease, a [place] (WHO) [facility]. ASSESSMENT: The probability of a major osteoporotic fracture is 22.0% within the next ten years. The probability of a hip fracture is 4.8% within the next ten years. Electronically Signed   By: Marlaine Hind M.D.   On: 05/02/2019 12:37      Assessment & Plan:   Problem List Items Addressed This Visit    Hypertension    Continue amlodipine, benazepril and hctz.  Blood pressure as outlined.  Continue current medications.  Follow pressures.  Follow metabolic panel.       Relevant  Medications   amLODipine (NORVASC) 5 MG tablet   Other Relevant Orders   Basic metabolic panel   Hypercholesterolemia    Intolerant to statin medication.  On zetia.  Low cholesterol diet and exercise.  Follow lipid panel.       Relevant Medications   amLODipine (NORVASC) 5 MG tablet   Other Relevant Orders   Hepatic function panel   Lipid panel   Abnormal liver function test    Diet and exercise.  Follow liver panel.       Hyperglycemia    Low carb diet and exercise.  Follow met b and a1c.       Relevant Orders   Hemoglobin A1c   Environmental allergies    Stable.  Controlled.       Neoplasm of left breast, primary tumor 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)    Has been followed by Dr Bary Castilla.   Mammogram 09/17/19 - Birads II.  Recommended f/u in one year.  Palpable fullness 2:00 left breast.  D/w Dr Bary Castilla.  Diagnostic mammogram.        Health care maintenance    Physical today 07/17/20.  Mammogram 09/17/19 - Birads II.  colonoscopy 2017 - diverticulosis.        Anemia    Follow cbc.       Relevant Orders   CBC with Differential/Platelet   Vaginal prolapse    Saw Dr Leafy Ro 05/2020.        Back pain    Right side/back pain.  Taking tylenol and using heating pad.  Some better.  Continue conservative measures.  Follow.  Notify me if persistent.        Relevant Medications   tiZANidine (ZANAFLEX) 4 MG tablet       Einar Pheasant, MD

## 2020-07-17 NOTE — Assessment & Plan Note (Addendum)
Physical today 07/17/20.  Mammogram 09/17/19 - Birads II.  colonoscopy 2017 - diverticulosis.

## 2020-07-21 ENCOUNTER — Telehealth: Payer: Self-pay | Admitting: Internal Medicine

## 2020-07-21 NOTE — Telephone Encounter (Signed)
Pt called and said that she was waiting on a call back from General surgery about a lump that was found at her last visit

## 2020-07-22 NOTE — Telephone Encounter (Signed)
FYI just a reminder for you

## 2020-07-23 ENCOUNTER — Encounter: Payer: Self-pay | Admitting: Internal Medicine

## 2020-07-23 DIAGNOSIS — M549 Dorsalgia, unspecified: Secondary | ICD-10-CM | POA: Insufficient documentation

## 2020-07-23 NOTE — Assessment & Plan Note (Signed)
Continue amlodipine, benazepril and hctz.  Blood pressure as outlined.  Continue current medications.  Follow pressures.  Follow metabolic panel.

## 2020-07-23 NOTE — Assessment & Plan Note (Signed)
Low carb diet and exercise.  Follow met b and a1c.  

## 2020-07-23 NOTE — Assessment & Plan Note (Signed)
Diet and exercise.  Follow liver panel.   

## 2020-07-23 NOTE — Telephone Encounter (Signed)
Patient is aware 

## 2020-07-23 NOTE — Assessment & Plan Note (Signed)
Intolerant to statin medication. On zetia.  Low cholesterol diet and exercise.  Follow lipid panel.  

## 2020-07-23 NOTE — Assessment & Plan Note (Signed)
Follow cbc.  

## 2020-07-23 NOTE — Telephone Encounter (Signed)
Please call and notify pt that Dr Helen Marshall office is going to call her and get her in for appt this week.

## 2020-07-23 NOTE — Assessment & Plan Note (Signed)
Stable. Controlled. 

## 2020-07-23 NOTE — Assessment & Plan Note (Signed)
Has been followed by Dr Bary Castilla.  Mammogram 09/17/19 - Birads II.  Recommended f/u in one year.  Palpable fullness 2:00 left breast.  D/w Dr Bary Castilla.  Diagnostic mammogram.

## 2020-07-23 NOTE — Assessment & Plan Note (Signed)
Saw Dr Leafy Ro 05/2020.

## 2020-07-23 NOTE — Assessment & Plan Note (Signed)
Right side/back pain.  Taking tylenol and using heating pad.  Some better.  Continue conservative measures.  Follow.  Notify me if persistent.

## 2020-07-26 ENCOUNTER — Other Ambulatory Visit: Payer: Self-pay | Admitting: Internal Medicine

## 2020-08-24 ENCOUNTER — Other Ambulatory Visit: Payer: Self-pay | Admitting: Internal Medicine

## 2020-09-08 ENCOUNTER — Telehealth: Payer: Self-pay | Admitting: Internal Medicine

## 2020-09-08 MED ORDER — AMLODIPINE BESYLATE 5 MG PO TABS: 5.0000 mg | ORAL_TABLET | Freq: Every day | ORAL | 3 refills | Status: DC

## 2020-09-08 MED ORDER — INTEGRA 62.5-62.5-40-3 MG PO CAPS: 1.0000 | ORAL_CAPSULE | Freq: Every day | ORAL | 0 refills | Status: DC

## 2020-09-08 NOTE — Addendum Note (Signed)
Addended by: Elpidio Galea T on: 09/08/2020 03:10 PM   Modules accepted: Orders

## 2020-09-08 NOTE — Telephone Encounter (Signed)
Pt needs a refill on the following sent to [facility]  -amLODipine (NORVASC) 5 MG tablet -Fe Fum-FePoly-Vit C-Vit B3 (INTEGRA) 62.5-62.5-40-3 MG CAPS  Pt has switched pharmacies and need new rx

## 2020-09-10 ENCOUNTER — Other Ambulatory Visit: Payer: Self-pay | Admitting: Internal Medicine

## 2020-09-13 ENCOUNTER — Other Ambulatory Visit: Payer: Self-pay | Admitting: Internal Medicine

## 2020-09-17 LAB — HM MAMMOGRAPHY

## 2020-10-22 ENCOUNTER — Telehealth: Payer: Self-pay | Admitting: Internal Medicine

## 2020-10-22 MED ORDER — POTASSIUM CHLORIDE CRYS ER 10 MEQ PO TBCR: 10.0000 meq | EXTENDED_RELEASE_TABLET | Freq: Every day | ORAL | 3 refills | Status: DC

## 2020-10-22 NOTE — Telephone Encounter (Signed)
Patient called in for refill KLOR-CON M10 10 MEQ tablet send to [facility] pharmacy

## 2020-11-03 ENCOUNTER — Other Ambulatory Visit: Payer: Self-pay

## 2020-11-03 ENCOUNTER — Ambulatory Visit
Admission: EM | Admit: 2020-11-03 | Discharge: 2020-11-03 | Disposition: A | Discharge status: Home / Self Care | Payer: Medicare Other | Attending: Family Medicine | Admitting: Family Medicine

## 2020-11-03 ENCOUNTER — Encounter: Payer: Self-pay | Admitting: Emergency Medicine

## 2020-11-03 DIAGNOSIS — J0110 Acute frontal sinusitis, unspecified: Secondary | ICD-10-CM | POA: Diagnosis not present

## 2020-11-03 MED ORDER — FLUTICASONE PROPIONATE 50 MCG/ACT NA SUSP: 2.0000 | Freq: Every day | NASAL | 2 refills | Status: DC

## 2020-11-03 MED ORDER — AMOXICILLIN 500 MG PO CAPS: 1000.0000 mg | ORAL_CAPSULE | Freq: Three times a day (TID) | ORAL | 0 refills | Status: AC

## 2020-11-03 NOTE — ED Triage Notes (Signed)
Patient reports 3 day history of cough, nasal congestion.  Patient reports minimal productive cough.  This morning head, forehead and cheek bones were hurting

## 2020-11-03 NOTE — Discharge Instructions (Addendum)
Take the amoxicillin as prescribed.  Flonase daily.  Continue your allergy medicine daily. Follow up as needed for continued or worsening symptoms

## 2020-11-04 NOTE — ED Provider Notes (Signed)
[name]    CSN: [id number] Arrival date & time: 11/03/20  1246      History   Chief Complaint Chief Complaint  Patient presents with  . Cough    HPI Helen Marshall is a 75 y.o. female.   Patient is a 75 year old female who presents today with complaints of 3-day history of cough, nasal congestion.  Minimal productive cough.  Some  frontal and maxillary sinus pressure. Symptoms have worsened. Mild fatigue.  No fevers, chills. No chest pain, SOB.    Cough   Past Medical History:  Diagnosis Date  . Abnormal LFTs   . Abnormal mammogram 12/19/2012   left  . Allergy   . Anemia   . Cancer [facility]) 2014   left Breast  . Carotid bruit    left  . Cataract   . Colon polyps   . Diverticulosis   . Diverticulosis 2012  . Environmental allergies   . GERD (gastroesophageal reflux disease)   . Heart murmur   . Hypercholesterolemia   . Hyperglycemia   . Hypertension   . IBS (irritable bowel syndrome)   . Lipoma of colon   . Lumbar disc disease   . 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  . Tubular adenoma of colon     Patient Active Problem List   Diagnosis Date Noted  . Back pain 07/23/2020  . Tremor of both hands 03/16/2020  . Vaginal prolapse 03/16/2020  . Electrolyte abnormality 03/03/2020  . Hand pain, left 06/06/2019  . Osteopenia 05/27/2019  . Estrogen deficiency 04/22/2019  . Sinusitis 07/21/2017  . Anemia 11/05/2015  . Health care maintenance 11/17/2014  . Umbilical hernia 21/22/4825  . Neoplasm of left breast, 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  . GERD (gastroesophageal reflux disease) 07/22/2012  . Diverticulosis 07/21/2012  . Hypertension 07/21/2012  . Hypercholesterolemia 07/21/2012  .  Abnormal liver function test 07/21/2012  . Left carotid bruit 07/21/2012  . Hyperglycemia 07/21/2012  . Environmental allergies 07/21/2012  . Lumbar disc disease 07/21/2012    Past Surgical History:  Procedure Laterality Date  . ABDOMINAL HYSTERECTOMY  1989   fibroids  . ANKLE SURGERY Left 1999  . BREAST SURGERY Left 1970   biopsy  . CATARACT EXTRACTION, BILATERAL  June & July 2017  . CHOLECYSTECTOMY  2001  . COLONOSCOPY  06/02/2011   Verdie Shire, MD; diverticulosis, submucosal lipoma of the sigmoid colon.  Marland Kitchen HERNIA REPAIR  2001, 2004  . HERNIA REPAIR  01/22/2013   Repeat repair of umbilical defect, 2.5 cm. Primary repair.  Marland Kitchen NASAL SINUS SURGERY  1997  . rotator cuff surgery  1998  . SQUAMOUS CELL CARCINOMA EXCISION Right 05/2013   shoulder  . TUBAL LIGATION  1976  . UPPER GI ENDOSCOPY  08/22/14   Dr Lenna Sciara. Hilarie Fredrickson    OB History    Gravida  2   Para  2   Term      Preterm      AB      Living  2     SAB      IAB      Ectopic      Multiple      Live Births           Obstetric Comments  1st Menstrual Cycle:  12 1st Pregnancy:  25         Home Medications    Prior to Admission medications   Medication Sig Start Date End Date Taking? Authorizing Provider  amLODipine (NORVASC) 5 MG tablet Take 1 tablet (5 mg total) by mouth daily. 09/08/20  Yes Einar Pheasant, MD  amoxicillin (AMOXIL) 500 MG capsule Take 2 capsules (1,000 mg total) by mouth 3 (three) times daily for 7 days. 11/03/20 11/10/20 Yes Shalamar Crays A, NP  benazepril (LOTENSIN) 40 MG tablet TAKE 1 TABLET BY MOUTH EVERY DAY 08/25/20  Yes Einar Pheasant, MD  ezetimibe (ZETIA) 10 MG tablet TAKE 1 TABLET BY MOUTH EVERY DAY 07/28/20  Yes Einar Pheasant, MD  fish oil-omega-3 fatty acids 1000 MG capsule 5 (five) times daily.    Yes [provider]  fluticasone (FLONASE) 50 MCG/ACT nasal spray Place 2 sprays into both nostrils as needed. 03/03/20  Yes Einar Pheasant, MD  fluticasone (FLONASE) 50 MCG/ACT  nasal spray Place 2 sprays into both nostrils daily. 11/03/20  Yes Samantha Ragen A, NP  hydrochlorothiazide (MICROZIDE) 12.5 MG capsule Take 1 capsule (12.5 mg total) by mouth daily. 04/10/20  Yes Einar Pheasant, MD  Multiple Vitamin (MULTIVITAMIN) tablet Take 1 tablet by mouth daily.   Yes [provider]  niacin (NIASPAN) 1000 MG CR tablet Take 1,000 mg by mouth 2 (two) times daily.   Yes [provider]  potassium chloride (KLOR-CON M10) 10 MEQ tablet Take 1 tablet (10 mEq total) by mouth daily. 10/22/20  Yes Einar Pheasant, MD  calcium-vitamin D (CALCIUM 500+D) 500-400 MG-UNIT per tablet Take 1 tablet by mouth daily.     [provider]  Fe Fum-FePoly-Vit C-Vit B3 (INTEGRA) 62.5-62.5-40-3 MG CAPS TAKE 1 CAPSULE BY MOUTH EVERY DAY 09/10/20   Einar Pheasant, MD  Probiotic Product (VSL#3 PO) Take by mouth. 1 capsul daily    [provider]  tiZANidine (ZANAFLEX) 4 MG tablet Take one tablet q hs prn 07/17/20   Einar Pheasant, MD  triamcinolone cream (KENALOG) 0.1 % Apply 1 application topically as needed.  07/08/14   [provider]  albuterol (VENTOLIN HFA) 108 (90 Base) MCG/ACT inhaler USE 2 INHALATIONS EVERY 6 HOURS AS NEEDED FOR WHEEZING OR SHORTNESS OF BREATH 07/12/19 11/03/20  Einar Pheasant, MD    Family History Family History  Problem Relation Age of Onset  . CVA Father   . Alcohol abuse Father   . Heart disease Mother        myocardial infarction  . Diabetes Mother   . Hearing loss Mother   . Hypertension Brother        x2  . Arthritis Brother   . Hearing loss Brother   . Alcohol abuse Brother   . Arthritis Brother   . Hearing loss Brother   . Arthritis/Rheumatoid Sister        x2  . Breast cancer Sister 75  . Arthritis Sister   . Hearing loss Sister   . Arthritis Sister   . Hearing loss Sister   . Asthma Sister   . Arthritis Sister   . Hearing loss Sister   . Hypothyroidism Sister   . Arthritis Sister   . Hearing loss Sister    . Cancer Other        breast - paternal first cousin  . Colon cancer Maternal Uncle   . Colon polyps Son   . Colon polyps Sister   . Arthritis Sister   .  Hearing loss Sister     Social History Social History   Tobacco Use  . Smoking status: Never Smoker  . Smokeless tobacco: Never Used  Vaping Use  . Vaping Use: Never used  Substance Use Topics  . Alcohol use: No    Alcohol/week: 0.0 standard drinks  . Drug use: No     Allergies   Crestor [rosuvastatin], Demerol  [meperidine hcl], Demerol [meperidine], Gabapentin, Latex, Lipitor [atorvastatin], Lovastatin, Pravastatin, Tramadol, Vicodin [hydrocodone-acetaminophen], and Zocor [simvastatin]   Review of Systems Review of Systems  Respiratory: Positive for cough.      Physical Exam Triage Vital Signs ED Triage Vitals  Enc Vitals Group     BP 11/03/20 1318 136/80     Pulse Rate 11/03/20 1318 77     Resp 11/03/20 1318 18     Temp 11/03/20 1318 98.2 F (36.8 C)     Temp Source 11/03/20 1318 Oral     SpO2 11/03/20 1318 96 %     Weight --      Height --      Head Circumference --      Peak Flow --      Pain Score 11/03/20 1334 6     Pain Loc --      Pain Edu? --      Excl. in [place]? --    No data found.  Updated Vital Signs BP 136/80 (BP Location: Left Arm)   Pulse 77   Temp 98.2 F (36.8 C) (Oral)   Resp 18   LMP 07/21/1988   SpO2 96%   Visual Acuity Right Eye Distance:   Left Eye Distance:   Bilateral Distance:    Right Eye Near:   Left Eye Near:    Bilateral Near:     Physical Exam Vitals and nursing note reviewed.  Constitutional:      General: She is not in acute distress.    Appearance: Normal appearance. She is not ill-appearing, toxic-appearing or diaphoretic.  HENT:     Head: Normocephalic.     Right Ear: Tympanic membrane, ear canal and external ear normal.     Left Ear: Tympanic membrane, ear canal and external ear normal.     Nose: Congestion present.     Mouth/Throat:      Pharynx: Oropharynx is clear.  Eyes:     Conjunctiva/sclera: Conjunctivae normal.  Cardiovascular:     Rate and Rhythm: Normal rate and regular rhythm.  Pulmonary:     Effort: Pulmonary effort is normal.     Breath sounds: Normal breath sounds.  Musculoskeletal:        General: Normal range of motion.     Cervical back: Normal range of motion.  Skin:    General: Skin is warm and dry.     Findings: No rash.  Neurological:     Mental Status: She is alert.  Psychiatric:        Mood and Affect: Mood normal.      UC Treatments / Results  Labs (all labs ordered are listed, but only abnormal results are displayed) Labs Reviewed - No data to display  EKG   Radiology No results found.  Procedures Procedures (including critical care time)  Medications Ordered in UC Medications - No data to display  Initial Impression / Assessment and Plan / UC Course  I have reviewed the triage vital signs and the nursing notes.  Pertinent labs & imaging results that were available during my care of the patient  were reviewed by me and considered in my medical decision making (see chart for details).     Sinusitis Recommended Flonase daily and continue allergy medication daily.  Treating with amoxicillin for bacterial coverage. Follow up as needed for continued or worsening symptoms  Final Clinical Impressions(s) / UC Diagnoses   Final diagnoses:  Acute non-recurrent frontal sinusitis     Discharge Instructions     Take the amoxicillin as prescribed.  Flonase daily.  Continue your allergy medicine daily. Follow up as needed for continued or worsening symptoms     ED Prescriptions    Medication Sig Dispense Auth. Provider   amoxicillin (AMOXIL) 500 MG capsule Take 2 capsules (1,000 mg total) by mouth 3 (three) times daily for 7 days. 42 capsule Krystina Strieter A, NP   fluticasone (FLONASE) 50 MCG/ACT nasal spray Place 2 sprays into both nostrils daily. 16 g Loura Halt A, NP      PDMP not reviewed this encounter.   Orvan July, NP 11/04/20 [phone]

## 2020-11-06 ENCOUNTER — Telehealth: Payer: Self-pay

## 2020-11-06 MED ORDER — EZETIMIBE 10 MG PO TABS: 10.0000 mg | ORAL_TABLET | Freq: Every day | ORAL | 1 refills | Status: DC

## 2020-11-06 MED ORDER — HYDROCHLOROTHIAZIDE 12.5 MG PO CAPS: 12.5000 mg | ORAL_CAPSULE | Freq: Every day | ORAL | 2 refills | Status: DC

## 2020-11-06 NOTE — Telephone Encounter (Signed)
Pt needs refill of hydrochlorothiazide (MICROZIDE) 12.5 MG capsule and ezetimibe (ZETIA) 10 MG tablet sent to [facility]

## 2020-11-06 NOTE — Telephone Encounter (Signed)
Pt called and wanted to ask Dr Nicki Reaper to give her albuterol or some type of inhaler. She is wheezing and coughing.

## 2020-11-07 MED ORDER — ALBUTEROL SULFATE HFA 108 (90 BASE) MCG/ACT IN AERS: 2.0000 | INHALATION_SPRAY | Freq: Four times a day (QID) | RESPIRATORY_TRACT | 0 refills | Status: DC | PRN

## 2020-11-07 NOTE — Telephone Encounter (Signed)
Spoken to patient. She was seen at an UC 11-03-20. She was given amoxicillin and Flonase. She is taking the abx but not the Flonase, she is picking that up today. She felt like she was wheezing yesterday, and wished to have an inhaler called in. PCP used to give them to pt per the patient. She is currently not wheezing and feels much better. Please advise.

## 2020-11-07 NOTE — Telephone Encounter (Signed)
Albuterol sent to pharmacy for the patient having case needed given that we are going into the weekend.  If she does not continue to improve she should follow-up with PCP next week.

## 2020-11-07 NOTE — Telephone Encounter (Signed)
Patient has been informed.

## 2020-11-07 NOTE — Telephone Encounter (Signed)
Please triage her and send to doc of day. Dr Nicki Reaper is out of office.

## 2020-11-13 ENCOUNTER — Other Ambulatory Visit: Payer: Medicare Other

## 2020-11-17 ENCOUNTER — Ambulatory Visit: Payer: Medicare Other | Admitting: Internal Medicine

## 2020-11-18 ENCOUNTER — Other Ambulatory Visit (INDEPENDENT_AMBULATORY_CARE_PROVIDER_SITE_OTHER): Payer: Medicare Other

## 2020-11-18 ENCOUNTER — Other Ambulatory Visit: Payer: Self-pay

## 2020-11-18 DIAGNOSIS — R739 Hyperglycemia, unspecified: Secondary | ICD-10-CM | POA: Diagnosis not present

## 2020-11-18 DIAGNOSIS — E7800 Pure hypercholesterolemia, unspecified: Secondary | ICD-10-CM

## 2020-11-18 DIAGNOSIS — D649 Anemia, unspecified: Secondary | ICD-10-CM

## 2020-11-18 DIAGNOSIS — I10 Essential (primary) hypertension: Secondary | ICD-10-CM

## 2020-11-18 LAB — HEPATIC FUNCTION PANEL
ALT: 19 U/L (ref 0–35)
AST: 18 U/L (ref 0–37)
Albumin: 3.7 g/dL (ref 3.5–5.2)
Alkaline Phosphatase: 54 U/L (ref 39–117)
Bilirubin, Direct: 0.1 mg/dL (ref 0.0–0.3)
Total Bilirubin: 0.6 mg/dL (ref 0.2–1.2)
Total Protein: 6.1 g/dL (ref 6.0–8.3)

## 2020-11-18 LAB — LIPID PANEL
Cholesterol: 168 mg/dL (ref 0–200)
HDL: 73.3 mg/dL (ref >39.00)
LDL Cholesterol: 72 mg/dL (ref 0–99)
NonHDL: 94.45
Total CHOL/HDL Ratio: 2
Triglycerides: 113 mg/dL (ref 0.0–149.0)
VLDL: 22.6 mg/dL (ref 0.0–40.0)

## 2020-11-18 LAB — BASIC METABOLIC PANEL
BUN: 14 mg/dL (ref 6–23)
CO2: 29 mEq/L (ref 19–32)
Calcium: 8.9 mg/dL (ref 8.4–10.5)
Chloride: 101 mEq/L (ref 96–112)
Creatinine, Ser: 0.73 mg/dL (ref 0.40–1.20)
GFR: 80.51 mL/min (ref >60.00)
Glucose, Bld: 93 mg/dL (ref 70–99)
Potassium: 3.2 mEq/L — ABNORMAL LOW (ref 3.5–5.1)
Sodium: 137 mEq/L (ref 135–145)

## 2020-11-18 LAB — CBC WITH DIFFERENTIAL/PLATELET
Basophils Absolute: 0 10*3/uL (ref 0.0–0.1)
Basophils Relative: 0.7 % (ref 0.0–3.0)
Eosinophils Absolute: 0.2 10*3/uL (ref 0.0–0.7)
Eosinophils Relative: 2.6 % (ref 0.0–5.0)
HCT: 40.4 % (ref 36.0–46.0)
Hemoglobin: 13.6 g/dL (ref 12.0–15.0)
Lymphocytes Relative: 39.3 % (ref 12.0–46.0)
Lymphs Abs: 2.4 10*3/uL (ref 0.7–4.0)
MCHC: 33.8 g/dL (ref 30.0–36.0)
MCV: 94.5 fl (ref 78.0–100.0)
Monocytes Absolute: 0.6 10*3/uL (ref 0.1–1.0)
Monocytes Relative: 9.4 % (ref 3.0–12.0)
Neutro Abs: 2.9 10*3/uL (ref 1.4–7.7)
Neutrophils Relative %: 48 % (ref 43.0–77.0)
Platelets: 228 10*3/uL (ref 150.0–400.0)
RBC: 4.27 Mil/uL (ref 3.87–5.11)
RDW: 13.9 % (ref 11.5–15.5)
WBC: 6.1 10*3/uL (ref 4.0–10.5)

## 2020-11-18 LAB — HEMOGLOBIN A1C: Hgb A1c MFr Bld: 5.8 % (ref 4.6–6.5)

## 2020-11-20 ENCOUNTER — Encounter: Payer: Self-pay | Admitting: Internal Medicine

## 2020-11-20 ENCOUNTER — Telehealth (INDEPENDENT_AMBULATORY_CARE_PROVIDER_SITE_OTHER): Payer: Medicare Other | Admitting: Internal Medicine

## 2020-11-20 VITALS — Ht 62.0 in | Wt 174.0 lb

## 2020-11-20 DIAGNOSIS — D649 Anemia, unspecified: Secondary | ICD-10-CM | POA: Diagnosis not present

## 2020-11-20 DIAGNOSIS — J329 Chronic sinusitis, unspecified: Secondary | ICD-10-CM

## 2020-11-20 DIAGNOSIS — I10 Essential (primary) hypertension: Secondary | ICD-10-CM | POA: Diagnosis not present

## 2020-11-20 DIAGNOSIS — E876 Hypokalemia: Secondary | ICD-10-CM | POA: Insufficient documentation

## 2020-11-20 DIAGNOSIS — N8110 Cystocele, unspecified: Secondary | ICD-10-CM

## 2020-11-20 DIAGNOSIS — R739 Hyperglycemia, unspecified: Secondary | ICD-10-CM

## 2020-11-20 DIAGNOSIS — E7800 Pure hypercholesterolemia, unspecified: Secondary | ICD-10-CM

## 2020-11-20 MED ORDER — HYDROCHLOROTHIAZIDE 12.5 MG PO CAPS: 12.5000 mg | ORAL_CAPSULE | Freq: Every day | ORAL | 2 refills | Status: DC

## 2020-11-20 MED ORDER — POTASSIUM CHLORIDE CRYS ER 10 MEQ PO TBCR: EXTENDED_RELEASE_TABLET | ORAL | 1 refills | Status: DC

## 2020-11-20 MED ORDER — INTEGRA 62.5-62.5-40-3 MG PO CAPS: 1.0000 | ORAL_CAPSULE | Freq: Every day | ORAL | 2 refills | Status: DC

## 2020-11-20 NOTE — Progress Notes (Signed)
Patient ID: Helen Marshall, female   DOB: 08-Feb-1946, 75 y.o.   MRN: [id number]   Virtual Visit via telephone Note  This visit type was conducted due to national recommendations for restrictions regarding the COVID-19 pandemic (e.g. social distancing).  This format is felt to be most appropriate for this patient at this time.  All issues noted in this document were discussed and addressed.  No physical exam was performed (except for noted visual exam findings with Video Visits).   I connected with Helen Marshall by telephone and verified that I am speaking with the correct person using two identifiers. Location patient: home Location provider:  home office Persons participating in the virtual visit: patient, provider  The limitations, risks, security and privacy concerns of performing an evaluation and management service by telephone and the availability of in person appointments have been discussed.  It has also been discussed with the patient that there may be a patient responsible charge related to this service. The patient expressed understanding and agreed to proceed.   Reason for visit: follow up appt  HPI: Was seen at urgent care 11/03/20 - diagnosed with sinus infection.  Treated with amoxicillin.  Persistent symptoms.  Saw Dr Tami Ribas.  Given prednison and levaquin.  Symptoms better now.  Taking a probiotic.  Has history of high blood pressure.  Blood pressure has been controlled.  Tries to stay active.  No chest pain or sob reported.  No abdominal pain or bowel change reported.  Still with increased stress.  Discussed.  Overall she feels she is handling things well.  Discussed labs.    ROS: See pertinent positives and negatives per HPI.  Past Medical History:  Diagnosis Date  . Abnormal LFTs   . Abnormal mammogram 12/19/2012   left  . Allergy   . Anemia   . Cancer [facility]) 2014   left Breast  . Carotid bruit    left  . Cataract   . Colon polyps   . Diverticulosis   .  Diverticulosis 2012  . Environmental allergies   . GERD (gastroesophageal reflux disease)   . Heart murmur   . Hypercholesterolemia   . Hyperglycemia   . Hypertension   . IBS (irritable bowel syndrome)   . Lipoma of colon   . Lumbar disc disease   . 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  . Tubular adenoma of colon     Past Surgical History:  Procedure Laterality Date  . ABDOMINAL HYSTERECTOMY  1989   fibroids  . ANKLE SURGERY Left 1999  . BREAST SURGERY Left 1970   biopsy  . CATARACT EXTRACTION, BILATERAL  June & July 2017  . CHOLECYSTECTOMY  2001  . COLONOSCOPY  06/02/2011   Verdie Shire, MD; diverticulosis, submucosal lipoma of the sigmoid colon.  Marland Kitchen HERNIA REPAIR  2001, 2004  . HERNIA REPAIR  01/22/2013   Repeat repair of umbilical defect, 2.5 cm. Primary repair.  Marland Kitchen NASAL SINUS SURGERY  1997  . rotator cuff surgery  1998  . SQUAMOUS CELL CARCINOMA EXCISION Right 05/2013   shoulder  . TUBAL LIGATION  1976  . UPPER GI ENDOSCOPY  08/22/14   Dr Billie Lade    Family History  Problem Relation Age of Onset  . CVA Father   . Alcohol abuse Father   .  Heart disease Mother        myocardial infarction  . Diabetes Mother   . Hearing loss Mother   . Hypertension Brother        x2  . Arthritis Brother   . Hearing loss Brother   . Alcohol abuse Brother   . Arthritis Brother   . Hearing loss Brother   . Arthritis/Rheumatoid Sister        x2  . Breast cancer Sister 69  . Arthritis Sister   . Hearing loss Sister   . Arthritis Sister   . Hearing loss Sister   . Asthma Sister   . Arthritis Sister   . Hearing loss Sister   . Hypothyroidism Sister   . Arthritis Sister   . Hearing loss Sister   . Cancer Other        breast - paternal first cousin  . Colon cancer  Maternal Uncle   . Colon polyps Son   . Colon polyps Sister   . Arthritis Sister   . Hearing loss Sister     SOCIAL HX: reviewed.    Current Outpatient Medications:  .  albuterol (VENTOLIN HFA) 108 (90 Base) MCG/ACT inhaler, Inhale 2 puffs into the lungs every 6 (six) hours as needed for wheezing or shortness of breath., Disp: 8 g, Rfl: 0 .  amLODipine (NORVASC) 5 MG tablet, Take 1 tablet (5 mg total) by mouth daily., Disp: 90 tablet, Rfl: 3 .  benazepril (LOTENSIN) 40 MG tablet, TAKE 1 TABLET BY MOUTH EVERY DAY, Disp: 90 tablet, Rfl: 3 .  calcium-vitamin D (CALCIUM 500+D) 500-400 MG-UNIT per tablet, Take 1 tablet by mouth daily. , Disp: , Rfl:  .  ezetimibe (ZETIA) 10 MG tablet, Take 1 tablet (10 mg total) by mouth daily., Disp: 90 tablet, Rfl: 1 .  Fe Fum-FePoly-Vit C-Vit B3 (INTEGRA) 62.5-62.5-40-3 MG CAPS, Take 1 tablet by mouth daily., Disp: 90 capsule, Rfl: 2 .  fish oil-omega-3 fatty acids 1000 MG capsule, 5 (five) times daily. , Disp: , Rfl:  .  fluticasone (FLONASE) 50 MCG/ACT nasal spray, Place 2 sprays into both nostrils as needed., Disp: 48 g, Rfl: 3 .  fluticasone (FLONASE) 50 MCG/ACT nasal spray, Place 2 sprays into both nostrils daily., Disp: 16 g, Rfl: 2 .  hydrochlorothiazide (MICROZIDE) 12.5 MG capsule, Take 1 capsule (12.5 mg total) by mouth daily., Disp: 90 capsule, Rfl: 2 .  Multiple Vitamin (MULTIVITAMIN) tablet, Take 1 tablet by mouth daily., Disp: , Rfl:  .  niacin (NIASPAN) 1000 MG CR tablet, Take 1,000 mg by mouth 2 (two) times daily., Disp: , Rfl:  .  potassium chloride (KLOR-CON M10) 10 MEQ tablet, Take 1-2 tablets per day as directed., Disp: 180 tablet, Rfl: 1 .  Probiotic Product (VSL#3 PO), Take by mouth. 1 capsul daily, Disp: , Rfl:  .  tiZANidine (ZANAFLEX) 4 MG tablet, Take one tablet q hs prn, Disp: 20 tablet, Rfl: 0 .  triamcinolone cream (KENALOG) 0.1 %, Apply 1 application topically as needed. , Disp: , Rfl:   EXAM:  GENERAL: alert. Sounds to be in  no acute distress.  Answering questions appropriately.   PSYCH/NEURO: pleasant and cooperative, no obvious depression or anxiety, speech and thought processing grossly intact  ASSESSMENT AND PLAN:  Discussed the following assessment and plan:  Problem List Items Addressed This Visit    Anemia - Primary    Follow cbc. Reports decreased B12 previously.  Check B12 level with next labs.         Relevant Medications   Fe Fum-FePoly-Vit C-Vit B3 (INTEGRA) 62.5-62.5-40-3 MG CAPS   Other Relevant Orders   Vitamin B12   Hypercholesterolemia    Intolerant to statin medication.  On zetia.  Low cholesterol diet and exercise.  Follow lipid panel and liver function tests.        Relevant Medications   hydrochlorothiazide (MICROZIDE) 12.5 MG capsule   Hyperglycemia    Low carb diet and exercise.  Follow met b and a1c.   Lab Results  Component Value Date   HGBA1C 5.8 11/18/2020        Hypertension    Continue amlodipine, benazepril and hctz.  Blood pressure has been under good control on this regimen.  Follow pressures.  Follow metabolic panel.  Increase potassium to bid.        Relevant Medications   hydrochlorothiazide (MICROZIDE) 12.5 MG capsule   Hypokalemia    Recent potassium slightly decreased.  Increased potassium to bid.  Recheck potassium in the next 10-14 days to confirm wnl.        Relevant Orders   Potassium   Sinusitis    Recent evaluation as outlined.  Treated initially with amoxicillin and then with levaquin and prednisone.  Doing better.  Follow.       Vaginal prolapse    Sees Dr Leafy Ro.           I discussed the assessment and treatment plan with the patient. The patient was provided an opportunity to ask questions and all were answered. The patient agreed with the plan and demonstrated an understanding of the instructions.   The patient was advised to call back or seek an in-person evaluation if the symptoms worsen or if the condition fails to improve as  anticipated.  I provided 22 minutes of non-face-to-face time during this encounter.   Einar Pheasant, MD

## 2020-11-23 ENCOUNTER — Encounter: Payer: Self-pay | Admitting: Internal Medicine

## 2020-11-23 NOTE — Assessment & Plan Note (Addendum)
Sees Dr Leafy Ro.

## 2020-11-23 NOTE — Assessment & Plan Note (Signed)
Recent potassium slightly decreased.  Increased potassium to bid.  Recheck potassium in the next 10-14 days to confirm wnl.

## 2020-11-23 NOTE — Assessment & Plan Note (Signed)
Low carb diet and exercise.  Follow met b and a1c.   Lab Results  Component Value Date   HGBA1C 5.8 11/18/2020

## 2020-11-23 NOTE — Assessment & Plan Note (Signed)
Intolerant to statin medication.  On zetia.  Low cholesterol diet and exercise.  Follow lipid panel and liver function tests.   

## 2020-11-23 NOTE — Assessment & Plan Note (Signed)
Follow cbc. Reports decreased B12 previously.  Check B12 level with next labs.

## 2020-11-23 NOTE — Assessment & Plan Note (Signed)
Recent evaluation as outlined.  Treated initially with amoxicillin and then with levaquin and prednisone.  Doing better.  Follow.

## 2020-11-23 NOTE — Assessment & Plan Note (Signed)
Continue amlodipine, benazepril and hctz.  Blood pressure has been under good control on this regimen.  Follow pressures.  Follow metabolic panel.  Increase potassium to bid.

## 2020-12-04 ENCOUNTER — Other Ambulatory Visit: Payer: Self-pay

## 2020-12-04 ENCOUNTER — Other Ambulatory Visit (INDEPENDENT_AMBULATORY_CARE_PROVIDER_SITE_OTHER): Payer: Medicare Other

## 2020-12-04 DIAGNOSIS — E876 Hypokalemia: Secondary | ICD-10-CM

## 2020-12-04 DIAGNOSIS — D649 Anemia, unspecified: Secondary | ICD-10-CM

## 2020-12-04 LAB — VITAMIN B12: Vitamin B-12: 691 pg/mL (ref 211–911)

## 2020-12-04 LAB — POTASSIUM: Potassium: 4.1 mEq/L (ref 3.5–5.1)

## 2020-12-05 ENCOUNTER — Other Ambulatory Visit: Payer: Self-pay | Admitting: Internal Medicine

## 2020-12-05 DIAGNOSIS — E876 Hypokalemia: Secondary | ICD-10-CM

## 2020-12-05 NOTE — Progress Notes (Signed)
Order placed for f/u potassium check.  

## 2020-12-15 ENCOUNTER — Telehealth: Payer: Self-pay

## 2020-12-15 MED ORDER — AMLODIPINE BESYLATE 5 MG PO TABS: 5.0000 mg | ORAL_TABLET | Freq: Every day | ORAL | 3 refills | Status: DC

## 2020-12-15 MED ORDER — TIZANIDINE HCL 4 MG PO TABS: ORAL_TABLET | ORAL | 0 refills | Status: DC

## 2020-12-15 MED ORDER — BENAZEPRIL HCL 40 MG PO TABS: 40.0000 mg | ORAL_TABLET | Freq: Every day | ORAL | 3 refills | Status: DC

## 2020-12-15 MED ORDER — INTEGRA 62.5-62.5-40-3 MG PO CAPS: 1.0000 | ORAL_CAPSULE | Freq: Every day | ORAL | 2 refills | Status: DC

## 2020-12-15 MED ORDER — ALBUTEROL SULFATE HFA 108 (90 BASE) MCG/ACT IN AERS: 2.0000 | INHALATION_SPRAY | Freq: Four times a day (QID) | RESPIRATORY_TRACT | 0 refills | Status: DC | PRN

## 2020-12-15 MED ORDER — HYDROCHLOROTHIAZIDE 12.5 MG PO CAPS: 12.5000 mg | ORAL_CAPSULE | Freq: Every day | ORAL | 2 refills | Status: DC

## 2020-12-15 MED ORDER — EZETIMIBE 10 MG PO TABS: 10.0000 mg | ORAL_TABLET | Freq: Every day | ORAL | 1 refills | Status: DC

## 2020-12-15 MED ORDER — POTASSIUM CHLORIDE CRYS ER 10 MEQ PO TBCR: EXTENDED_RELEASE_TABLET | ORAL | 1 refills | Status: DC

## 2020-12-15 MED ORDER — FLUTICASONE PROPIONATE 50 MCG/ACT NA SUSP: 2.0000 | NASAL | 3 refills | Status: DC | PRN

## 2020-12-15 NOTE — Telephone Encounter (Signed)
Pt needs med refill of benazepril (LOTENSIN) 40 MG tablet sent to [facility]-PLEASE CHANGE ALL MEDS TO THAT PHARM

## 2020-12-17 ENCOUNTER — Other Ambulatory Visit: Payer: Medicare Other

## 2020-12-17 NOTE — Addendum Note (Signed)
Addended by: Leeanne Rio on: 12/17/2020 04:51 PM   Modules accepted: Orders

## 2020-12-19 ENCOUNTER — Other Ambulatory Visit: Payer: Self-pay

## 2020-12-19 ENCOUNTER — Other Ambulatory Visit (INDEPENDENT_AMBULATORY_CARE_PROVIDER_SITE_OTHER): Payer: Medicare Other

## 2020-12-19 DIAGNOSIS — E876 Hypokalemia: Secondary | ICD-10-CM

## 2020-12-20 LAB — POTASSIUM: Potassium: 3.8 mmol/L (ref 3.5–5.3)

## 2020-12-30 ENCOUNTER — Ambulatory Visit (INDEPENDENT_AMBULATORY_CARE_PROVIDER_SITE_OTHER): Payer: Medicare Other

## 2020-12-30 ENCOUNTER — Other Ambulatory Visit: Payer: Self-pay

## 2020-12-30 VITALS — BP 120/78 | HR 74 | Temp 97.8°F | Resp 15 | Ht 62.0 in | Wt 174.0 lb

## 2020-12-30 DIAGNOSIS — Z0000 Encounter for general adult medical examination without abnormal findings: Secondary | ICD-10-CM

## 2020-12-30 NOTE — Progress Notes (Signed)
Subjective:   TERRILL WAUTERS is a 75 y.o. female who presents for Medicare Annual (Subsequent) preventive examination.  Review of Systems    No ROS.  Medicare Wellness   Cardiac Risk Factors include: advanced age (>49men, >25 women)     Objective:    Today's Vitals   12/30/20 1123  BP: 120/78  Pulse: 74  Resp: 15  Temp: 97.8 F (36.6 C)  SpO2: 97%  Weight: 174 lb (78.9 kg)  Height: 5\' 2"  (1.575 m)   Body mass index is 31.83 kg/m.  Advanced Directives 12/30/2020 12/27/2019 11/17/2018 08/10/2018 08/09/2017 04/18/2017 07/12/2016  Does Patient Have a Medical Advance Directive? Yes Yes Yes Yes Yes Yes Yes  Type of Paramedic of [place];Living will [place];Living will Living will Living will;Healthcare Power of [place];Living will [place];Living will [place];Living will  Does patient want to make changes to medical advance directive? No - Patient declined No - Patient declined - No - Patient declined No - Patient declined - No - Patient declined  Copy of [place] in Chart? No - copy requested No - copy requested - No - copy requested No - copy requested - No - copy requested  Would patient like information on creating a medical advance directive? - - No - Guardian declined - - - -    Current Medications (verified) Outpatient Encounter Medications as of 12/30/2020  Medication Sig  . albuterol (VENTOLIN HFA) 108 (90 Base) MCG/ACT inhaler Inhale 2 puffs into the lungs every 6 (six) hours as needed for wheezing or shortness of breath.  Marland Kitchen amLODipine (NORVASC) 5 MG tablet Take 1 tablet (5 mg total) by mouth daily.  . benazepril (LOTENSIN) 40 MG tablet Take 1 tablet (40 mg total) by mouth daily.  . calcium-vitamin D (CALCIUM 500+D) 500-400 MG-UNIT per tablet Take 1 tablet by mouth daily.   Marland Kitchen ezetimibe (ZETIA) 10 MG tablet Take 1 tablet (10 mg total) by mouth  daily.  . Fe Fum-FePoly-Vit C-Vit B3 (INTEGRA) 62.5-62.5-40-3 MG CAPS Take 1 tablet by mouth daily.  . fish oil-omega-3 fatty acids 1000 MG capsule 5 (five) times daily.   . fluticasone (FLONASE) 50 MCG/ACT nasal spray Place 2 sprays into both nostrils daily.  . fluticasone (FLONASE) 50 MCG/ACT nasal spray Place 2 sprays into both nostrils as needed.  . hydrochlorothiazide (MICROZIDE) 12.5 MG capsule Take 1 capsule (12.5 mg total) by mouth daily.  . Multiple Vitamin (MULTIVITAMIN) tablet Take 1 tablet by mouth daily.  . niacin (NIASPAN) 1000 MG CR tablet Take 1,000 mg by mouth 2 (two) times daily.  . potassium chloride (KLOR-CON M10) 10 MEQ tablet Take 1-2 tablets per day as directed.  . Probiotic Product (VSL#3 PO) Take by mouth. 1 capsul daily  . tiZANidine (ZANAFLEX) 4 MG tablet Take one tablet q hs prn  . triamcinolone cream (KENALOG) 0.1 % Apply 1 application topically as needed.    No facility-administered encounter medications on file as of 12/30/2020.    Allergies (verified) Crestor [rosuvastatin], Demerol  [meperidine hcl], Demerol [meperidine], Gabapentin, Latex, Lipitor [atorvastatin], Lovastatin, Pravastatin, Tramadol, Vicodin [hydrocodone-acetaminophen], and Zocor [simvastatin]   History: Past Medical History:  Diagnosis Date  . Abnormal LFTs   . Abnormal mammogram 12/19/2012   left  . Allergy   . Anemia   . Cancer [facility]) 2014   left Breast  . Carotid bruit    left  . Cataract   .  Colon polyps   . Diverticulosis   . Diverticulosis 2012  . Environmental allergies   . GERD (gastroesophageal reflux disease)   . Heart murmur   . Hypercholesterolemia   . Hyperglycemia   . Hypertension   . IBS (irritable bowel syndrome)   . Lipoma of colon   . Lumbar disc disease   . 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  . Tubular adenoma of colon    Past Surgical History:  Procedure Laterality Date  . ABDOMINAL HYSTERECTOMY  1989   fibroids  . ANKLE SURGERY Left 1999  . BREAST SURGERY Left 1970   biopsy  . CATARACT EXTRACTION, BILATERAL  June & July 2017  . CHOLECYSTECTOMY  2001  . COLONOSCOPY  06/02/2011   Verdie Shire, MD; diverticulosis, submucosal lipoma of the sigmoid colon.  Marland Kitchen HERNIA REPAIR  2001, 2004  . HERNIA REPAIR  01/22/2013   Repeat repair of umbilical defect, 2.5 cm. Primary repair.  Marland Kitchen NASAL SINUS SURGERY  1997  . rotator cuff surgery  1998  . SQUAMOUS CELL CARCINOMA EXCISION Right 05/2013   shoulder  . TUBAL LIGATION  1976  . UPPER GI ENDOSCOPY  08/22/14   Dr Billie Lade   Family History  Problem Relation Age of Onset  . CVA Father   . Alcohol abuse Father   . Heart disease Mother        myocardial infarction  . Diabetes Mother   . Hearing loss Mother   . Hypertension Brother        x2  . Arthritis Brother   . Hearing loss Brother   . Alcohol abuse Brother   . Arthritis Brother   . Hearing loss Brother   . Arthritis/Rheumatoid Sister        x2  . Breast cancer Sister 57  . Arthritis Sister   . Hearing loss Sister   . Arthritis Sister   . Hearing loss Sister   . Asthma Sister   . Arthritis Sister   . Hearing loss Sister   . Hypothyroidism Sister   . Arthritis Sister   . Hearing loss Sister   . Cancer Other        breast - paternal first cousin  . Colon cancer Maternal Uncle   . Colon polyps Son   . Colon polyps Sister   . Arthritis Sister   . Hearing loss Sister    Social History   Socioeconomic History  . Marital status: Married    Spouse name: Not on file  . Number of children: 2  . Years of education: Not on file  . Highest education level: Not on file  Occupational History  . Occupation: Retired  Tobacco Use  . Smoking status: Never Smoker  . Smokeless tobacco: Never Used  Vaping Use  . Vaping  Use: Never used  Substance and Sexual Activity  . Alcohol use: No    Alcohol/week: 0.0 standard drinks  . Drug use: No  . Sexual activity: Never  Other Topics Concern  . Not on file  Social History Narrative  . Not on file   Social Determinants of Health   Financial Resource Strain: Low Risk   . Difficulty of Paying Living Expenses: Not hard at all  Food Insecurity: No Food Insecurity  . Worried About Estate manager/land agent  of Food in the Last Year: Never true  . Ran Out of Food in the Last Year: Never true  Transportation Needs: No Transportation Needs  . Lack of Transportation (Medical): No  . Lack of Transportation (Non-Medical): No  Physical Activity: Not on file  Stress: No Stress Concern Present  . Feeling of Stress : Not at all  Social Connections: Unknown  . Frequency of Communication with Friends and Family: Not on file  . Frequency of Social Gatherings with Friends and Family: Not on file  . Attends Religious Services: Not on file  . Active Member of Clubs or Organizations: Not on file  . Attends Archivist Meetings: Not on file  . Marital Status: Married    Tobacco Counseling Counseling given: Not Answered   Clinical Intake:  Pre-visit preparation completed: Yes        Diabetes: No  How often do you need to have someone help you when you read instructions, pamphlets, or other written materials from your doctor or pharmacy?: 1 - Never Interpreter Needed?: No      Activities of Daily Living In your present state of health, do you have any difficulty performing the following activities: 12/30/2020  Hearing? N  Vision? N  Difficulty concentrating or making decisions? N  Walking or climbing stairs? N  Dressing or bathing? N  Doing errands, shopping? N  Preparing Food and eating ? N  Using the Toilet? N  In the past six months, have you accidently leaked urine? N  Do you have problems with loss of bowel control? N  Managing your Medications? N   Managing your Finances? N  Housekeeping or managing your Housekeeping? N  Some recent data might be hidden    Patient Care Team: Einar Pheasant, MD as PCP - General (Internal Medicine) Bary Castilla, Forest Gleason, MD (General Surgery) Dasher, Rayvon Char, MD (Dermatology) Troxler, Rodman Key, DPM (Podiatry)  Indicate any recent Medical Services you may have received from other than [facility] providers in the past year (date may be approximate).     Assessment:   This is a routine wellness examination for [employer].  Hearing/Vision screen  Hearing Screening   125Hz  250Hz  500Hz  1000Hz  2000Hz  3000Hz  4000Hz  6000Hz  8000Hz   Right ear:           Left ear:           Comments: Hearing aids  Vision Screening Comments: Wears corrective lenses when reading Cataract extraction, bilateral Visual acuity not assessed, virtual visit.  They have seen their ophthalmologist in the last 12 months.     Dietary issues and exercise activities discussed: Current Exercise Habits: Home exercise routine, Intensity: Mild  Goals Addressed              This Visit's Progress     Patient Stated   .  Increase physical activity (pt-stated)        Chair exercises Stretching    .  COMPLETED: Weight (lb) < 177 lb (80.3 kg) (pt-stated)   174 lb (78.9 kg)     Eat more vegatables      Depression Screen PHQ 2/9 Scores 12/30/2020 12/27/2019 08/29/2019 08/10/2018 08/09/2017 07/19/2017 07/12/2016  PHQ - 2 Score 0 0 0 1 0 0 0  PHQ- 9 Score - - - - 0 - -    Fall Risk Fall Risk  12/30/2020 12/27/2019 11/29/2019 09/19/2018 08/10/2018  Falls in the past year? 0 0 0 0 0  Number falls in past yr: 0 0 - 0 -  Injury with Fall? 0 - - - -  Follow up Falls evaluation completed - Falls evaluation completed Falls evaluation completed -    FALL RISK PREVENTION PERTAINING TO THE HOME: Handrails in use when climbing stairs? Yes Home free of loose throw rugs in walkways, pet beds, electrical cords, etc? Yes  Adequate lighting in your home to  reduce risk of falls? Yes   ASSISTIVE DEVICES UTILIZED TO PREVENT FALLS: Life alert? No  Use of a cane, walker or w/c? No   TIMED UP AND GO: Was the test performed? Yes . Steady gait. No assistive devices in use.   Cognitive Function:  MMSE - Mini Mental State Exam 08/09/2017 07/12/2016 07/17/2015  Orientation to time 5 5 5   Orientation to Place 5 5 5   Registration 3 3 3   Attention/ Calculation 5 5 5   Recall 3 3 3   Language- name 2 objects 2 2 2   Language- repeat 1 1 1   Language- follow 3 step command 3 3 3   Language- read & follow direction 1 1 1   Write a sentence 1 1 1   Copy design 1 1 1   Total score 30 30 30      6CIT Screen 12/30/2020 12/27/2019 08/10/2018  What Year? 0 points 0 points 0 points  What month? 0 points 0 points 0 points  What time? 0 points 0 points 0 points  Count back from 20 0 points 0 points 0 points  Months in reverse 0 points 0 points 0 points  Repeat phrase 0 points 0 points 0 points  Total Score 0 0 0    Immunizations Immunization History  Administered Date(s) Administered  . Fluad Quad(high Dose 65+) 04/19/2019, 07/15/2020  . Influenza Split 05/16/2012, 04/02/2017  . Influenza, High Dose Seasonal PF 05/15/2016, 03/30/2017, 05/25/2018  . Influenza,inj,Quad PF,6+ Mos 05/09/2013, 05/23/2014, 07/03/2015  . PFIZER(Purple Top)SARS-COV-2 Vaccination 08/09/2019, 09/01/2019, 03/31/2020, 11/21/2020  . Pneumococcal Conjugate-13 09/13/2013  . Pneumococcal Polysaccharide-23 11/08/2014  . Pneumococcal-Unspecified 08/02/2009  . Tdap 04/26/2014  . Zoster Recombinat (Shingrix) 01/03/2018, 03/21/2018  . Zoster, Live 08/02/2009    Health Maintenance There are no preventive care reminders to display for this patient. Health Maintenance  Topic Date Due  . INFLUENZA VACCINE  03/02/2021  . COLONOSCOPY (Pts 45-16yrs Insurance coverage will need to be confirmed)  03/04/2021  . MAMMOGRAM  09/17/2021  . TETANUS/TDAP  04/26/2024  . DEXA SCAN  Completed  . COVID-19  Vaccine  Completed  . Hepatitis C Screening  Completed  . PNA vac Low Risk Adult  Completed  . Zoster Vaccines- Shingrix  Completed  . HPV VACCINES  Aged Out   Colorectal cancer screening: Type of screening: Colonoscopy. Completed 03/04/16. Repeat every 5 years  Mammogram status: Completed 09/17/20. Repeat every year  Lung Cancer Screening: (Low Dose CT Chest recommended if Age 72-80 years, 30 pack-year currently smoking OR have quit w/in 15years.) does not qualify.   Vision Screening: Recommended annual ophthalmology exams for early detection of glaucoma and other disorders of the eye. Is the patient up to date with their annual eye exam?  Yes   Dental Screening: Recommended annual dental exams for proper oral hygiene.  Community Resource Referral / Chronic Care Management: CRR required this visit?  No   CCM required this visit?  No      Plan:   Keep all routine maintenance appointments.   I have personally reviewed and noted the following in the patient's chart:   . Medical and social history . Use of alcohol,  tobacco or illicit drugs  . Current medications and supplements including opioid prescriptions.  . Functional ability and status . Nutritional status . Physical activity . Advanced directives . List of other physicians . Hospitalizations, surgeries, and ER visits in previous 12 months . Vitals . Screenings to include cognitive, depression, and falls . Referrals and appointments  In addition, I have reviewed and discussed with patient certain preventive protocols, quality metrics, and best practice recommendations. A written personalized care plan for preventive services as well as general preventive health recommendations were provided to patient.     Varney Biles, LPN   [id number]

## 2020-12-30 NOTE — Patient Instructions (Addendum)
Helen Marshall , Thank you for taking time to come for your Medicare Wellness Visit. I appreciate your ongoing commitment to your health goals. Please review the following plan we discussed and let me know if I can assist you in the future.   These are the goals we discussed: Goals      Patient Stated   .  Increase physical activity (pt-stated)      Chair exercises Stretching       This is a list of the screening recommended for you and due dates:  Health Maintenance  Topic Date Due  . Flu Shot  03/02/2021  . Colon Cancer Screening  03/04/2021  . Mammogram  09/17/2021  . Tetanus Vaccine  04/26/2024  . DEXA scan (bone density measurement)  Completed  . COVID-19 Vaccine  Completed  . Hepatitis C Screening: USPSTF Recommendation to screen - Ages 55-79 yo.  Completed  . Pneumonia vaccines  Completed  . Zoster (Shingles) Vaccine  Completed  . HPV Vaccine  Aged Out   Immunizations Immunization History  Administered Date(s) Administered  . Fluad Quad(high Dose 65+) 04/19/2019, 07/15/2020  . Influenza Split 05/16/2012, 04/02/2017  . Influenza, High Dose Seasonal PF 05/15/2016, 03/30/2017, 05/25/2018  . Influenza,inj,Quad PF,6+ Mos 05/09/2013, 05/23/2014, 07/03/2015  . PFIZER(Purple Top)SARS-COV-2 Vaccination 08/09/2019, 09/01/2019, 03/31/2020, 11/21/2020  . Pneumococcal Conjugate-13 09/13/2013  . Pneumococcal Polysaccharide-23 11/08/2014  . Pneumococcal-Unspecified 08/02/2009  . Tdap 04/26/2014  . Zoster Recombinat (Shingrix) 01/03/2018, 03/21/2018  . Zoster, Live 08/02/2009   Advanced directives: End of life planning; Advance aging; Advanced directives discussed.  Copy of current HCPOA/Living Will requested.    Conditions/risks identified: none new  Follow up in one year for your annual wellness visit    Preventive Care 65 Years and Older, Female Preventive care refers to lifestyle choices and visits with your health care provider that can promote health and wellness. What  does preventive care include?  A yearly physical exam. This is also called an annual well check.  Dental exams once or twice a year.  Routine eye exams. Ask your health care provider how often you should have your eyes checked.  Personal lifestyle choices, including:  Daily care of your teeth and gums.  Regular physical activity.  Eating a healthy diet.  Avoiding tobacco and drug use.  Limiting alcohol use.  Practicing safe sex.  Taking low-dose aspirin every day.  Taking vitamin and mineral supplements as recommended by your health care provider. What happens during an annual well check? The services and screenings done by your health care provider during your annual well check will depend on your age, overall health, lifestyle risk factors, and family history of disease. Counseling  Your health care provider may ask you questions about your:  Alcohol use.  Tobacco use.  Drug use.  Emotional well-being.  Home and relationship well-being.  Sexual activity.  Eating habits.  History of falls.  Memory and ability to understand (cognition).  Work and work Statistician.  Reproductive health. Screening  You may have the following tests or measurements:  Height, weight, and BMI.  Blood pressure.  Lipid and cholesterol levels. These may be checked every 5 years, or more frequently if you are over 33 years old.  Skin check.  Lung cancer screening. You may have this screening every year starting at age 51 if you have a 30-pack-year history of smoking and currently smoke or have quit within the past 15 years.  Fecal occult blood test (FOBT) of the stool. You may  have this test every year starting at age 21.  Flexible sigmoidoscopy or colonoscopy. You may have a sigmoidoscopy every 5 years or a colonoscopy every 10 years starting at age 45.  Hepatitis C blood test.  Hepatitis B blood test.  Sexually transmitted disease (STD) testing.  Diabetes screening.  This is done by checking your blood sugar (glucose) after you have not eaten for a while (fasting). You may have this done every 1-3 years.  Bone density scan. This is done to screen for osteoporosis. You may have this done starting at age 69.  Mammogram. This may be done every 1-2 years. Talk to your health care provider about how often you should have regular mammograms. Talk with your health care provider about your test results, treatment options, and if necessary, the need for more tests. Vaccines  Your health care provider may recommend certain vaccines, such as:  Influenza vaccine. This is recommended every year.  Tetanus, diphtheria, and acellular pertussis (Tdap, Td) vaccine. You may need a Td booster every 10 years.  Zoster vaccine. You may need this after age 37.  Pneumococcal 13-valent conjugate (PCV13) vaccine. One dose is recommended after age 23.  Pneumococcal polysaccharide (PPSV23) vaccine. One dose is recommended after age 60. Talk to your health care provider about which screenings and vaccines you need and how often you need them. This information is not intended to replace advice given to you by your health care provider. Make sure you discuss any questions you have with your health care provider. Document Released: 08/15/2015 Document Revised: 04/07/2016 Document Reviewed: 05/20/2015 Elsevier Interactive Patient Education  2017 [place] Prevention in the Home Falls can cause injuries. They can happen to people of all ages. There are many things you can do to make your home safe and to help prevent falls. What can I do on the outside of my home?  Regularly fix the edges of walkways and driveways and fix any cracks.  Remove anything that might make you trip as you walk through a door, such as a raised step or threshold.  Trim any bushes or trees on the path to your home.  Use bright outdoor lighting.  Clear any walking paths of anything that might  make someone trip, such as rocks or tools.  Regularly check to see if handrails are loose or broken. Make sure that both sides of any steps have handrails.  Any raised decks and porches should have guardrails on the edges.  Have any leaves, snow, or ice cleared regularly.  Use sand or salt on walking paths during winter.  Clean up any spills in your garage right away. This includes oil or grease spills. What can I do in the bathroom?  Use night lights.  Install grab bars by the toilet and in the tub and shower. Do not use towel bars as grab bars.  Use non-skid mats or decals in the tub or shower.  If you need to sit down in the shower, use a plastic, non-slip stool.  Keep the floor dry. Clean up any water that spills on the floor as soon as it happens.  Remove soap buildup in the tub or shower regularly.  Attach bath mats securely with double-sided non-slip rug tape.  Do not have throw rugs and other things on the floor that can make you trip. What can I do in the bedroom?  Use night lights.  Make sure that you have a light by your bed that is easy  to reach.  Do not use any sheets or blankets that are too big for your bed. They should not hang down onto the floor.  Have a firm chair that has side arms. You can use this for support while you get dressed.  Do not have throw rugs and other things on the floor that can make you trip. What can I do in the kitchen?  Clean up any spills right away.  Avoid walking on wet floors.  Keep items that you use a lot in easy-to-reach places.  If you need to reach something above you, use a strong step stool that has a grab bar.  Keep electrical cords out of the way.  Do not use floor polish or wax that makes floors slippery. If you must use wax, use non-skid floor wax.  Do not have throw rugs and other things on the floor that can make you trip. What can I do with my stairs?  Do not leave any items on the stairs.  Make sure  that there are handrails on both sides of the stairs and use them. Fix handrails that are broken or loose. Make sure that handrails are as long as the stairways.  Check any carpeting to make sure that it is firmly attached to the stairs. Fix any carpet that is loose or worn.  Avoid having throw rugs at the top or bottom of the stairs. If you do have throw rugs, attach them to the floor with carpet tape.  Make sure that you have a light switch at the top of the stairs and the bottom of the stairs. If you do not have them, ask someone to add them for you. What else can I do to help prevent falls?  Wear shoes that:  Do not have high heels.  Have rubber bottoms.  Are comfortable and fit you well.  Are closed at the toe. Do not wear sandals.  If you use a stepladder:  Make sure that it is fully opened. Do not climb a closed stepladder.  Make sure that both sides of the stepladder are locked into place.  Ask someone to hold it for you, if possible.  Clearly mark and make sure that you can see:  Any grab bars or handrails.  First and last steps.  Where the edge of each step is.  Use tools that help you move around (mobility aids) if they are needed. These include:  Canes.  Walkers.  Scooters.  Crutches.  Turn on the lights when you go into a dark area. Replace any light bulbs as soon as they burn out.  Set up your furniture so you have a clear path. Avoid moving your furniture around.  If any of your floors are uneven, fix them.  If there are any pets around you, be aware of where they are.  Review your medicines with your doctor. Some medicines can make you feel dizzy. This can increase your chance of falling. Ask your doctor what other things that you can do to help prevent falls. This information is not intended to replace advice given to you by your health care provider. Make sure you discuss any questions you have with your health care provider. Document  Released: 05/15/2009 Document Revised: 12/25/2015 Document Reviewed: 08/23/2014 Elsevier Interactive Patient Education  2017 [employer].

## 2021-03-11 ENCOUNTER — Encounter: Payer: Self-pay | Admitting: Internal Medicine

## 2021-03-24 ENCOUNTER — Encounter: Payer: Self-pay | Admitting: Internal Medicine

## 2021-03-26 ENCOUNTER — Ambulatory Visit: Payer: Medicare Other | Admitting: Internal Medicine

## 2021-04-09 NOTE — Progress Notes (Signed)
Helen Marshall New Patient Evaluation and Consultation  Referring Provider: Benjaman Kindler, MD PCP: Einar Pheasant, MD Date of Service: 04/10/2021  SUBJECTIVE Chief Complaint: New Patient (Initial Visit) Helen Marshall is a 75 y.o. female here for a consult on prolapse.)  History of Present Illness: Helen Marshall is a 75 y.o. White or Caucasian female seen in consultation at the request of Dr. Leafy Ro for evaluation of prolapse.    Review of records from Dr Leafy Ro significant for: Reports worsening prolapse  Urinary Symptoms: Leaks urine with with a full bladder on the way to the bathroom Leaks every day Pad use: seldom uses a liner She is not bothered by her UI symptoms.  Day time voids 8+.  Nocturia: 2 times per night to void. Voiding dysfunction: she empties her bladder well.  does not use a catheter to empty bladder.  When urinating, she feels she has no difficulties Drinks: 2 cups coffee in AM, 1-2 glasses tea, otherwise water   UTIs:  0  UTI's in the last year.   Denies history of blood in urine and kidney or bladder stones  Pelvic Organ Prolapse Symptoms:                  She Admits to a feeling of a bulge the vaginal area. It has been present for 2-3 years.  She Admits to seeing a bulge.  This bulge is bothersome. Tried a #4 ring pessary but this fell out. Tried two other sizes.   Bowel Symptom: Bowel movements: every few days Stool consistency: hard Straining: yes.  Splinting: no.  Incomplete evacuation: no.  She Denies accidental bowel leakage / fecal incontinence  Bowel regimen: fiber, stool softener, miralax, and magnesium and probiotics Last colonoscopy: due for one  Sexual Function Sexually active: no.    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   Tubular adenoma of colon      Past Surgical History:   Past Surgical History:  Procedure Laterality Date   ABDOMINAL HYSTERECTOMY  1989   fibroids   ANKLE SURGERY Left 1999   BREAST SURGERY Left 1970   biopsy   CATARACT EXTRACTION, BILATERAL  June & July 2017   CHOLECYSTECTOMY  2001   COLONOSCOPY  06/02/2011   Verdie Shire, MD; diverticulosis, submucosal lipoma of the sigmoid colon.   GALLBLADDER SURGERY     HERNIA REPAIR  2001, 2004   HERNIA REPAIR  01/22/2013   Repeat repair of umbilical defect, 2.5 cm. Primary repair.   LAPAROSCOPIC HYSTERECTOMY     NASAL SINUS SURGERY  1997   rotator cuff  surgery  1998   SQUAMOUS CELL CARCINOMA EXCISION Right 05/2013   shoulder   TUBAL LIGATION  1976   UPPER GI ENDOSCOPY  08/22/2014   Dr Billie Lade     Past OB/GYN History: OB History  Gravida Para Term Preterm AB Living  '2 2       2  '$ SAB IAB Ectopic Multiple Live Births          2    # Outcome Date GA Lbr Len/2nd Weight Sex Delivery Anes PTL Lv  2 Para           1 Para             Obstetric Comments  1st Menstrual Cycle:  12  1st Pregnancy:  25    Vaginal deliveries: 2,  Forceps/ Vacuum deliveries: 0, Cesarean section: 0 S/p abdominal hysterectomy in 1989 for fibroids   Medications: She has a current medication list which includes the following prescription(s): albuterol, amlodipine, benazepril, calcium-vitamin d, ezetimibe,  integra, fish oil-omega-3 fatty acids, fluticasone, fluticasone, hydrochlorothiazide, multivitamin, niacin, potassium chloride, probiotic product, and triamcinolone cream.   Allergies: Patient is allergic to crestor [rosuvastatin], demerol  [meperidine hcl], demerol [meperidine], gabapentin, latex, lipitor [atorvastatin], lovastatin, pravastatin, tramadol, vicodin [hydrocodone-acetaminophen], and zocor [simvastatin].   Social History:  Social History   Tobacco Use   Smoking status: Never   Smokeless tobacco: Never  Vaping Use   Vaping Use: Never used  Substance Use Topics   Alcohol use: No    Alcohol/week: 0.0 standard drinks   Drug use: No    Relationship status: married She lives with husband.   She is not employed.   Family History:   Family History  Problem Relation Age of Onset   CVA Father    Alcohol abuse Father    Heart disease Mother        myocardial infarction   Diabetes Mother    Hearing loss Mother    Hypertension Brother        x2   Arthritis Brother    Hearing loss Brother    Alcohol abuse Brother    Arthritis Brother    Hearing loss Brother    Arthritis/Rheumatoid Sister        x2   Breast cancer Sister 67   Arthritis Sister    Hearing loss Sister    Arthritis Sister    Hearing loss Sister    Asthma Sister    Arthritis Sister    Hearing loss Sister    Hypothyroidism Sister    Arthritis Sister    Hearing loss Sister    Cancer Other        breast - paternal first cousin   Colon cancer Maternal Uncle    Colon polyps Son    Colon polyps Sister    Arthritis Sister    Hearing loss Sister      Review of Systems: Review of Systems  Constitutional:  Negative for fever, malaise/fatigue and weight loss.  Respiratory:  Negative for cough, shortness of breath and wheezing.   Cardiovascular:  Negative for chest pain, palpitations and leg swelling.  Gastrointestinal:  Positive for abdominal pain. Negative for blood in stool.  Genitourinary:   Negative for dysuria.  Musculoskeletal:  Positive for myalgias.  Skin:  Negative for rash.  Neurological:  Negative for dizziness and headaches.  Endo/Heme/Allergies:  Bruises/bleeds easily.  Psychiatric/Behavioral:  Negative for depression. The patient is not nervous/anxious.     OBJECTIVE Physical Exam: Vitals:   04/10/21  0959  BP: 129/77  Pulse: 73  Weight: 173 lb (78.5 kg)  Height: '5\' 2"'$  (1.575 m)    Physical Exam Constitutional:      General: She is not in acute distress. Pulmonary:     Effort: Pulmonary effort is normal.  Abdominal:     General: There is no distension.     Palpations: Abdomen is soft.     Tenderness: There is no abdominal tenderness. There is no rebound.  Musculoskeletal:        General: No swelling. Normal range of motion.  Skin:    General: Skin is warm and dry.     Findings: No rash.  Neurological:     Mental Status: She is alert and oriented to person, place, and time.  Psychiatric:        Mood and Affect: Mood normal.        Behavior: Behavior normal.     GU / Detailed Urogynecologic Evaluation:  Pelvic Exam: Normal external female genitalia; Bartholin's and Skene's glands normal in appearance; urethral meatus normal in appearance, no urethral masses or discharge.   CST: negative  s/p hysterectomy: Speculum exam reveals normal vaginal mucosa with  atrophy and normal vaginal cuff.  Adnexa normal adnexa.     Pelvic floor strength I/V  Pelvic floor musculature: Right levator non-tender, Right obturator non-tender, Left levator non-tender, Left obturator non-tender  POP-Q:   POP-Q  2                                            Aa   2                                           Ba  -4                                              C   5                                            Gh  4.5                                            Pb  7                                            tvl   -2                                             Ap  -2  Bp                                                 D     Rectal Exam:  Normal external rectum  Post-Void Residual (PVR) by Bladder Scan: In order to evaluate bladder emptying, we discussed obtaining a postvoid residual and she agreed to this procedure.  Procedure: The ultrasound unit was placed on the patient's abdomen in the suprapubic region after the patient had voided. A PVR of 9 ml was obtained by bladder scan.  Laboratory Results: POC urine: trace leukocytes   ASSESSMENT AND PLAN Ms. Auger is a 75 y.o. 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 Prolapse of anterior vaginal wall   3. Urinary frequency   4. Urge incontinence     Stage III anterior, Stage I posterior, Stage I apical prolapse - For treatment of pelvic organ prolapse, we discussed options for management including expectant management, conservative management, and surgical management, such as Kegels, a pessary, pelvic floor physical therapy, and specific surgical procedures. - She is interested in trying another pessary- will have her return for a fitting.   2. Urge incontinence - We discussed reducing bladder irritants such as coffee and tea which could be contributing to symptoms.   Jaquita Folds, MD   Medical Decision Making:  - Reviewed/ ordered a clinical laboratory test - Review and summation of prior records

## 2021-04-10 ENCOUNTER — Encounter: Payer: Self-pay | Admitting: Obstetrics and Gynecology

## 2021-04-10 ENCOUNTER — Other Ambulatory Visit: Payer: Self-pay

## 2021-04-10 ENCOUNTER — Ambulatory Visit (INDEPENDENT_AMBULATORY_CARE_PROVIDER_SITE_OTHER): Payer: Medicare Other | Admitting: Obstetrics and Gynecology

## 2021-04-10 VITALS — BP 129/77 | HR 73 | Ht 62.0 in | Wt 173.0 lb

## 2021-04-10 DIAGNOSIS — N8110 Cystocele, unspecified: Secondary | ICD-10-CM | POA: Diagnosis not present

## 2021-04-10 DIAGNOSIS — N3941 Urge incontinence: Secondary | ICD-10-CM

## 2021-04-10 DIAGNOSIS — R350 Frequency of micturition: Secondary | ICD-10-CM | POA: Diagnosis not present

## 2021-04-10 DIAGNOSIS — N993 Prolapse of vaginal vault after hysterectomy: Secondary | ICD-10-CM | POA: Diagnosis not present

## 2021-04-10 LAB — POCT URINALYSIS DIPSTICK
Appearance: NORMAL
Bilirubin, UA: NEGATIVE
Blood, UA: NEGATIVE
Glucose, UA: NEGATIVE
Ketones, UA: NEGATIVE
Nitrite, UA: NEGATIVE
Protein, UA: NEGATIVE
Spec Grav, UA: 1.015 (ref 1.010–1.025)
Urobilinogen, UA: 0.2 E.U./dL
pH, UA: 7 (ref 5.0–8.0)

## 2021-04-10 NOTE — Patient Instructions (Signed)
You have a stage 3 (out of 4) prolapse.  We discussed the fact that it is not life threatening but there are several treatment options. For treatment of pelvic organ prolapse, we discussed options for management including expectant management, conservative management, and surgical management, such as Kegels, a pessary, pelvic floor physical therapy, and specific surgical procedures.      

## 2021-04-13 ENCOUNTER — Telehealth: Payer: Self-pay | Admitting: Obstetrics and Gynecology

## 2021-04-14 NOTE — Telephone Encounter (Signed)
Spoke with patient. She would like surgery instead of a pessary. Will have her proceed with urodynamic testing- please arrange. Thank you.

## 2021-04-22 NOTE — Telephone Encounter (Signed)
Called pt to set up appt for urodynamics testing. Pt unable to schedule at that time. Will call back later today or tomorrow.

## 2021-04-23 NOTE — Telephone Encounter (Signed)
Urodynamics appt given

## 2021-04-26 ENCOUNTER — Emergency Department
Admission: EM | Admit: 2021-04-26 | Discharge: 2021-04-26 | Disposition: A | Discharge status: Home / Self Care | Payer: Medicare Other | Attending: Emergency Medicine | Admitting: Emergency Medicine

## 2021-04-26 ENCOUNTER — Other Ambulatory Visit: Payer: Self-pay

## 2021-04-26 ENCOUNTER — Encounter: Payer: Self-pay | Admitting: Internal Medicine

## 2021-04-26 ENCOUNTER — Emergency Department: Payer: Medicare Other

## 2021-04-26 DIAGNOSIS — K5732 Diverticulitis of large intestine without perforation or abscess without bleeding: Secondary | ICD-10-CM | POA: Insufficient documentation

## 2021-04-26 DIAGNOSIS — Z853 Personal history of malignant neoplasm of breast: Secondary | ICD-10-CM | POA: Insufficient documentation

## 2021-04-26 DIAGNOSIS — K922 Gastrointestinal hemorrhage, unspecified: Secondary | ICD-10-CM | POA: Diagnosis not present

## 2021-04-26 DIAGNOSIS — Z79899 Other long term (current) drug therapy: Secondary | ICD-10-CM | POA: Insufficient documentation

## 2021-04-26 DIAGNOSIS — Z91040 Latex allergy status: Secondary | ICD-10-CM | POA: Insufficient documentation

## 2021-04-26 DIAGNOSIS — I10 Essential (primary) hypertension: Secondary | ICD-10-CM | POA: Insufficient documentation

## 2021-04-26 DIAGNOSIS — K625 Hemorrhage of anus and rectum: Secondary | ICD-10-CM | POA: Diagnosis present

## 2021-04-26 DIAGNOSIS — R911 Solitary pulmonary nodule: Secondary | ICD-10-CM | POA: Insufficient documentation

## 2021-04-26 LAB — COMPREHENSIVE METABOLIC PANEL
ALT: 23 U/L (ref 0–44)
AST: 29 U/L (ref 15–41)
Albumin: 4 g/dL (ref 3.5–5.0)
Alkaline Phosphatase: 77 U/L (ref 38–126)
Anion gap: 11 (ref 5–15)
BUN: 13 mg/dL (ref 8–23)
CO2: 25 mmol/L (ref 22–32)
Calcium: 9.4 mg/dL (ref 8.9–10.3)
Chloride: 102 mmol/L (ref 98–111)
Creatinine, Ser: 0.65 mg/dL (ref 0.44–1.00)
GFR, Estimated: >60 mL/min (ref >60)
Glucose, Bld: 119 mg/dL — ABNORMAL HIGH (ref 70–99)
Potassium: 3.5 mmol/L (ref 3.5–5.1)
Sodium: 138 mmol/L (ref 135–145)
Total Bilirubin: 0.7 mg/dL (ref 0.3–1.2)
Total Protein: 7.3 g/dL (ref 6.5–8.1)

## 2021-04-26 LAB — TYPE AND SCREEN
ABO/RH(D): O POS
Antibody Screen: NEGATIVE

## 2021-04-26 LAB — CBC
HCT: 38.6 % (ref 36.0–46.0)
Hemoglobin: 13.3 g/dL (ref 12.0–15.0)
MCH: 33.3 pg (ref 26.0–34.0)
MCHC: 34.5 g/dL (ref 30.0–36.0)
MCV: 96.7 fL (ref 80.0–100.0)
Platelets: 235 10*3/uL (ref 150–400)
RBC: 3.99 MIL/uL (ref 3.87–5.11)
RDW: 13 % (ref 11.5–15.5)
WBC: 5.8 10*3/uL (ref 4.0–10.5)
nRBC: 0 % (ref 0.0–0.2)

## 2021-04-26 MED ORDER — IOHEXOL 350 MG/ML SOLN
80.0000 mL | Freq: Once | INTRAVENOUS | Status: AC | PRN
  Administered 2021-04-26: 80 mL via INTRAVENOUS

## 2021-04-26 MED ORDER — METRONIDAZOLE 500 MG PO TABS: 500.0000 mg | ORAL_TABLET | Freq: Three times a day (TID) | ORAL | 0 refills | Status: DC

## 2021-04-26 MED ORDER — CIPROFLOXACIN HCL 500 MG PO TABS: 500.0000 mg | ORAL_TABLET | Freq: Two times a day (BID) | ORAL | 0 refills | Status: DC

## 2021-04-26 NOTE — Discharge Instructions (Addendum)
Please follow-up with your gastroenterologist for an appointment as soon as possible.

## 2021-04-26 NOTE — ED Provider Notes (Signed)
[facility] Emergency Department Provider Note   ____________________________________________   Event Date/Time   First MD Initiated Contact with Patient 04/26/21 [phone]     (approximate)  I have reviewed the triage vital signs and the nursing notes.   HISTORY  Chief Complaint  HPI Helen Marshall is a 75 y.o. female who presents for diarrhea, rectal bleeding, and bilateral upper quadrant abdominal pain  LOCATION: Abdomen DURATION: Began last night TIMING: Improved since onset SEVERITY: Mild QUALITY: Rectal bleeding CONTEXT: Patient states that she has a history of IBS and began having some loose stools that were grossly bloody last night that have now partially resolved into small stool balls that also have a small amount of bright red blood.  Patient states that she does have a history of internal hemorrhoids but denies any significant rectal bleeding like this in the past MODIFYING FACTORS: Denies any exacerbating or relieving factors ASSOCIATED SYMPTOMS: Bilateral upper quadrant abdominal pain   Per medical record review, patient is history of IBS, diverticulosis, and internal hemorrhoids          Past Medical History:  Diagnosis Date   Abnormal LFTs    Abnormal mammogram 12/19/2012   left   Allergy    Anemia    Cancer ([place]) 2014   left Breast   Carotid bruit    left   Cataract    Colon polyps    Diverticulosis    Diverticulosis 2012   Environmental allergies    GERD (gastroesophageal reflux disease)    Heart murmur    Hypercholesterolemia    Hyperglycemia    Hypertension    IBS (irritable bowel syndrome)    Lipoma of colon    Lumbar disc disease    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   Tubular adenoma of colon      Patient Active Problem List   Diagnosis Date Noted   Hypokalemia 11/20/2020   Back pain 07/23/2020   Tremor of both hands 03/16/2020   Vaginal prolapse 03/16/2020   Electrolyte abnormality 03/03/2020   Hand pain, left 06/06/2019   Osteopenia 05/27/2019   Estrogen deficiency 04/22/2019   Sinusitis 07/21/2017   Anemia 11/05/2015   Health care maintenance 32/20/2542   Umbilical hernia 70/62/3762   Neoplasm of left breast, 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   GERD (gastroesophageal reflux disease) 07/22/2012   Diverticulosis 07/21/2012   Hypertension 07/21/2012   Hypercholesterolemia 07/21/2012   Abnormal liver function test 07/21/2012   Left carotid bruit 07/21/2012   Hyperglycemia 07/21/2012   Environmental allergies 07/21/2012   Lumbar disc disease 07/21/2012    Past Surgical History:  Procedure Laterality Date   ABDOMINAL HYSTERECTOMY  1989   fibroids   ANKLE SURGERY Left 1999   BREAST SURGERY Left 1970   biopsy   CATARACT EXTRACTION, BILATERAL  June & July 2017   CHOLECYSTECTOMY  2001   COLONOSCOPY  06/02/2011   Verdie Shire, MD; diverticulosis, submucosal lipoma of the sigmoid colon.   GALLBLADDER SURGERY     HERNIA REPAIR  2001, 2004   HERNIA REPAIR  01/22/2013   Repeat repair of umbilical defect, 2.5 cm. Primary repair.   LAPAROSCOPIC HYSTERECTOMY     NASAL SINUS SURGERY  1997   rotator cuff surgery  1998  SQUAMOUS CELL CARCINOMA EXCISION Right 05/2013   shoulder   TUBAL LIGATION  1976   UPPER GI ENDOSCOPY  08/22/2014   Dr Billie Lade    Prior to Admission medications   Medication Sig Start Date End Date Taking? Authorizing Provider  ciprofloxacin (CIPRO) 500 MG tablet Take 1 tablet (500 mg total) by mouth 2 (two) times daily for 5 days. 04/26/21 05/01/21 Yes Mehkai Gallo, Vista Lawman, MD  metroNIDAZOLE (FLAGYL) 500 MG tablet Take 1 tablet (500 mg total) by mouth 3 (three) times daily for 5 days. 04/26/21 05/01/21 Yes Delainy Mcelhiney,  Vista Lawman, MD  albuterol (VENTOLIN HFA) 108 (90 Base) MCG/ACT inhaler Inhale 2 puffs into the lungs every 6 (six) hours as needed for wheezing or shortness of breath. 12/15/20   Einar Pheasant, MD  amLODipine (NORVASC) 5 MG tablet Take 1 tablet (5 mg total) by mouth daily. 12/15/20   Einar Pheasant, MD  benazepril (LOTENSIN) 40 MG tablet Take 1 tablet (40 mg total) by mouth daily. 12/15/20   Einar Pheasant, MD  calcium-vitamin D (OSCAL-500) 500-400 MG-UNIT tablet Take 1 tablet by mouth daily.     [provider]  ezetimibe (ZETIA) 10 MG tablet Take 1 tablet (10 mg total) by mouth daily. 12/15/20   Einar Pheasant, MD  Fe Fum-FePoly-Vit C-Vit B3 (INTEGRA) 62.5-62.5-40-3 MG CAPS Take 1 tablet by mouth daily. 12/15/20   Einar Pheasant, MD  fish oil-omega-3 fatty acids 1000 MG capsule 5 (five) times daily.     [provider]  fluticasone (FLONASE) 50 MCG/ACT nasal spray Place 2 sprays into both nostrils daily. 11/03/20   Loura Halt A, NP  fluticasone (FLONASE) 50 MCG/ACT nasal spray Place 2 sprays into both nostrils as needed. 12/15/20   Einar Pheasant, MD  hydrochlorothiazide (MICROZIDE) 12.5 MG capsule Take 1 capsule (12.5 mg total) by mouth daily. 12/15/20   Einar Pheasant, MD  Multiple Vitamin (MULTIVITAMIN) tablet Take 1 tablet by mouth daily.    [provider]  niacin (NIASPAN) 1000 MG CR tablet Take 1,000 mg by mouth 2 (two) times daily.    [provider]  potassium chloride (KLOR-CON M10) 10 MEQ tablet Take 1-2 tablets per day as directed. 12/15/20   Einar Pheasant, MD  Probiotic Product (VSL#3 PO) Take by mouth. 1 capsul daily    [provider]  triamcinolone cream (KENALOG) 0.1 % Apply 1 application topically as needed.  07/08/14   [provider]    Allergies Crestor [rosuvastatin], Demerol  [meperidine hcl], Demerol [meperidine], Gabapentin, Latex, Lipitor [atorvastatin], Lovastatin, Pravastatin, Tramadol, Vicodin  [hydrocodone-acetaminophen], and Zocor [simvastatin]  Family History  Problem Relation Age of Onset   CVA Father    Alcohol abuse Father    Heart disease Mother        myocardial infarction   Diabetes Mother    Hearing loss Mother    Hypertension Brother        x2   Arthritis Brother    Hearing loss Brother    Alcohol abuse Brother    Arthritis Brother    Hearing loss Brother    Arthritis/Rheumatoid Sister        x2   Breast cancer Sister 67   Arthritis Sister    Hearing loss Sister    Arthritis Sister    Hearing loss Sister    Asthma Sister    Arthritis Sister    Hearing loss Sister    Hypothyroidism Sister    Arthritis Sister    Hearing loss Sister  Cancer Other        breast - paternal first cousin   Colon cancer Maternal Uncle    Colon polyps Son    Colon polyps Sister    Arthritis Sister    Hearing loss Sister     Social History Social History   Tobacco Use   Smoking status: Never   Smokeless tobacco: Never  Vaping Use   Vaping Use: Never used  Substance Use Topics   Alcohol use: No    Alcohol/week: 0.0 standard drinks   Drug use: No    Review of Systems Constitutional: No fever/chills Eyes: No visual changes. ENT: No sore throat. Cardiovascular: Denies chest pain. Respiratory: Denies shortness of breath. Gastrointestinal: Endorses bilateral upper quadrant abdominal pain.  Endorses hematochezia.  No nausea, no vomiting.  No diarrhea. Genitourinary: Negative for dysuria. Musculoskeletal: Negative for acute arthralgias Skin: Negative for rash. Neurological: Negative for headaches, weakness/numbness/paresthesias in any extremity Psychiatric: Negative for suicidal ideation/homicidal ideation   ____________________________________________   PHYSICAL EXAM:  VITAL SIGNS: ED Triage Vitals  Enc Vitals Group     BP 04/26/21 0519 (!) 152/79     Pulse Rate 04/26/21 0519 93     Resp 04/26/21 0519 18     Temp 04/26/21 0519 98 F (36.7 C)      Temp Source 04/26/21 0519 Oral     SpO2 04/26/21 0519 96 %     Weight 04/26/21 0520 175 lb (79.4 kg)     Height 04/26/21 0520 5\' 2"  (1.575 m)     Head Circumference --      Peak Flow --      Pain Score 04/26/21 0520 8     Pain Loc --      Pain Edu? --      Excl. in [place]? --    Constitutional: Alert and oriented. Well appearing and in no acute distress. Eyes: Conjunctivae are normal. PERRL. Head: Atraumatic. Nose: No congestion/rhinnorhea. Mouth/Throat: Mucous membranes are moist. Neck: No stridor Cardiovascular: Grossly normal heart sounds.  Good peripheral circulation. Respiratory: Normal respiratory effort.  No retractions. Gastrointestinal: Soft and mild tenderness palpation in bilateral upper abdominal quadrants. No distention. Musculoskeletal: No obvious deformities Neurologic:  Normal speech and language. No gross focal neurologic deficits are appreciated. Skin:  Skin is warm and dry. No rash noted. Psychiatric: Mood and affect are normal. Speech and behavior are normal.  ____________________________________________   LABS (all labs ordered are listed, but only abnormal results are displayed)  Labs Reviewed  COMPREHENSIVE METABOLIC PANEL - Abnormal; Notable for the following components:      Result Value   Glucose, Bld 119 (*)    All other components within normal limits  CBC  POC OCCULT BLOOD, ED  TYPE AND SCREEN    RADIOLOGY  ED MD interpretation: CT of the abdomen and pelvis with IV contrast shows acute uncomplicated sigmoid diverticulitis  Official radiology report(s): CT Abdomen Pelvis W Contrast  Result Date: 04/26/2021 CLINICAL DATA:  Rectal bleeding and abdominal pain EXAM: CT ABDOMEN AND PELVIS WITH CONTRAST TECHNIQUE: Multidetector CT imaging of the abdomen and pelvis was performed using the standard protocol following bolus administration of intravenous contrast. CONTRAST:  31mL OMNIPAQUE IOHEXOL 350 MG/ML SOLN COMPARISON:  None. FINDINGS: Lower chest:  Right lower lobe lung nodule measures 5 mm, image 6/4. Hepatobiliary: Inferior right lobe of liver cyst measures 1.6 cm. Within segment 4 there is a focal area of low attenuation adjacent to the falciform ligament measuring 2.0 x 1.3 cm, image  24/2. Likely focal fatty deposition. Mild intrahepatic bile duct dilatation. Status post cholecystectomy. No signs of obstructing mass or choledocholithiasis. Pancreas: Unremarkable. No pancreatic ductal dilatation or surrounding inflammatory changes. Spleen: Normal in size without focal abnormality. Adrenals/Urinary Tract: Normal adrenal glands. Tiny, less than 1 cm renal hypodensities are too small to characterize. No suspicious mass, nephrolithiasis or hydronephrosis. Urinary bladder unremarkable. Stomach/Bowel: Stomach normal. The appendix is not confidently identified and may be surgically absent. No pericecal inflammation. Sigmoid diverticulosis. Focally inflamed diverticula identified involving the mid sigmoid colon within the right lower quadrant of the abdomen, image 68/2. Surrounding inflammatory soft tissue stranding is identified. No signs of perforation. No abscess identified. There is a large umbilical hernia which contains fat as well as a loop of small bowel. The herniated small bowel loops appear mildly increased in caliber scratch set the herniated small bowel loops are nondilated but there are air-fluid levels within the herniated small bowel. No signs of bowel obstruction. Vascular/Lymphatic: Aortic atherosclerosis.  No adenopathy. Reproductive: Status post hysterectomy. No adnexal masses. Other: No free fluid or fluid collections. No signs of pneumoperitoneum. Musculoskeletal: Degenerative disc disease noted within the lumbar spine. Signs of bilateral hip AVN. IMPRESSION: 1. Examination is positive for acute sigmoid diverticulitis. No signs of perforation or abscess. 2. Large umbilical hernia containing loop of a loop of non-dilated small bowel containing  air-fluid levels. No signs of significant bowel obstruction. 3. Right lower lobe lung nodule measures 5 mm. No follow-up needed if patient is low-risk. Non-contrast chest CT can be considered in 12 months if patient is high-risk. This recommendation follows the consensus statement: Guidelines for Management of Incidental Pulmonary Nodules Detected on CT Images: From the Fleischner Society 2017; Radiology 2017; 284:228-243. 4. Aortic Atherosclerosis (ICD10-I70.0). Electronically Signed   By: Kerby Moors M.D.   On: 04/26/2021 09:35    ____________________________________________   PROCEDURES  Procedure(s) performed (including Critical Care):  .1-3 Lead EKG Interpretation Performed by: Naaman Plummer, MD Authorized by: Naaman Plummer, MD     Interpretation: normal     ECG rate:  72   ECG rate assessment: normal     Rhythm: sinus rhythm     Ectopy: none     Conduction: normal     ____________________________________________   INITIAL IMPRESSION / ASSESSMENT AND PLAN / ED COURSE  As part of my medical decision making, I reviewed the following data within the electronic medical record, if available:  Nursing notes reviewed and incorporated, Labs reviewed, EKG interpreted, Old chart reviewed, Radiograph reviewed and Notes from prior ED visits reviewed and incorporated        Patient has diverticulitis that is amenable to oral antibiotics. Patient has no peritoneal signs or signs of perforation. Patients symptoms not typical for other emergent causes of abdominal pain such as, but not limited to, appendicitis, abdominal aortic aneurysm, surgical biliary disease, acute coronary syndrome, etc.  Patient will be discharged with strict return precautions and follow up with primary MD within 12-24 hours for further evaluation.  Patient understands that they may require admission and IV antibiotics and possibly surgery if they do not improve with oral antibiotics.       ____________________________________________   FINAL CLINICAL IMPRESSION(S) / ED DIAGNOSES  Final diagnoses:  Diverticulitis of sigmoid colon  Lower GI bleed     ED Discharge Orders          Ordered    ciprofloxacin (CIPRO) 500 MG tablet  2 times daily  04/26/21 0949    metroNIDAZOLE (FLAGYL) 500 MG tablet  3 times daily        04/26/21 3887             Note:  This document was prepared using Dragon voice recognition software and may include unintentional dictation errors.    Naaman Plummer, MD 04/26/21 [phone]

## 2021-04-26 NOTE — ED Triage Notes (Signed)
Pt presents to ER c/o bloody stools that she noticed starting shortly after midnight tonight.  Pt states the stool was diarrhea and was red in color.  Pt denies having any hemorrhoids. Pt denies taking blood thinners.  Pt states she has had 5 episodes tonight with last BM tonight was at 0445.  Pt A&O x4 at this time.  VSS

## 2021-04-27 ENCOUNTER — Encounter: Payer: Self-pay | Admitting: Physician Assistant

## 2021-04-28 ENCOUNTER — Telehealth: Payer: Self-pay | Admitting: Internal Medicine

## 2021-04-28 ENCOUNTER — Other Ambulatory Visit: Payer: Self-pay

## 2021-04-28 MED ORDER — AMOXICILLIN-POT CLAVULANATE 875-125 MG PO TABS: 1.0000 | ORAL_TABLET | Freq: Two times a day (BID) | ORAL | 0 refills | Status: DC

## 2021-04-28 NOTE — Telephone Encounter (Signed)
Inbound call from pt requesting a call back stating that she is feeling dizzy, feeling bloated and abd pain. Please advise. Thank you.

## 2021-04-28 NOTE — Telephone Encounter (Signed)
Please call her and see what is going on. ED notes stated for her to return if her symptoms did not improve with oral antibiotics within 1-2 days. Sounds like she may need to be re-evaluated.

## 2021-04-28 NOTE — Telephone Encounter (Signed)
Pt notified with Dr. Garth Schlatter recommendations. Cipro and Flagyl Discontinued. Prescription of Augmentin sent to pharmacy. Pt reminded of appointment 05/28/2021. Pt  verbalized understanding with all questions answered.

## 2021-04-28 NOTE — Telephone Encounter (Signed)
Reviewed note.  She contacted GI.  Abx changed to augmentin.  Please notify her to call with update to confirm improvement after change.

## 2021-04-28 NOTE — Telephone Encounter (Signed)
Pt stated that she went to the ED. Pt stated that  a CT was performed and showed Sigmoid Diverticulitis. Pt was prescribed Cipro and Flagyl. Pt states that now  she is having side affects from the medications such as dizziness, nausea, bloating and abdominal pain. Pt is requesting a different medication with less side effects if available. Please advise

## 2021-04-28 NOTE — Telephone Encounter (Signed)
Spoken to patient. She stated she is going to contact Dr Amada Kingfisher her GI doctor. Also if unable to be seen by them she will go to UC/ED.

## 2021-04-28 NOTE — Telephone Encounter (Signed)
Reviewed note and message.  Agree if having dizziness and abdominal pain - needs to be evaluated to determine etiology of symptoms.

## 2021-04-28 NOTE — Telephone Encounter (Signed)
Ok to change to new therapy given side-effects D/c cipro and flagyl Try Augmentin 875 mg BID x 7 days She should call if not getting better APP visit next available recommended for continuity and followup diverticulitis Thanks JMP

## 2021-04-28 NOTE — Telephone Encounter (Signed)
Patient is currently having sx of dizziness first thing in the morning after taking medication (has since disappeared), abdominal/stomach pain in center of abdomen, bloating, tiredness. Patient is also taking a probiotic to help as well. No, fever, chills, nausea, vomiting, diarrhea, blood during BM, and headaches.

## 2021-04-28 NOTE — Telephone Encounter (Signed)
Patient calling in and she went to the emergency room Sunday morning for bleeding rectum. She was diagnosed with diverticulitis.   She was given Cipro to treat but states this has been bothering her stomach. Bloating, nausea, and fatigue are the symptoms Patient is having. Patient started the Cipro Sunday morning, symptom onset late Sunday.   Patient wanting to know what she should do?  Please advise

## 2021-04-29 NOTE — Telephone Encounter (Signed)
Patient is aware of below. As of right now, patient says she is feeling better already since medication change. Tolerating new abx better. Has f/u with PCP and GI in October

## 2021-05-02 DIAGNOSIS — Z8719 Personal history of other diseases of the digestive system: Secondary | ICD-10-CM

## 2021-05-02 DIAGNOSIS — K429 Umbilical hernia without obstruction or gangrene: Secondary | ICD-10-CM

## 2021-05-02 HISTORY — DX: Umbilical hernia without obstruction or gangrene: K42.9

## 2021-05-02 HISTORY — DX: Personal history of other diseases of the digestive system: Z87.19

## 2021-05-12 ENCOUNTER — Other Ambulatory Visit: Payer: Self-pay

## 2021-05-12 ENCOUNTER — Encounter: Payer: Self-pay | Admitting: Internal Medicine

## 2021-05-12 ENCOUNTER — Ambulatory Visit: Payer: Medicare Other | Admitting: Internal Medicine

## 2021-05-12 VITALS — BP 122/67 | HR 74 | Temp 97.8°F | Ht 62.0 in | Wt 174.8 lb

## 2021-05-12 DIAGNOSIS — R7989 Other specified abnormal findings of blood chemistry: Secondary | ICD-10-CM

## 2021-05-12 DIAGNOSIS — M545 Low back pain, unspecified: Secondary | ICD-10-CM

## 2021-05-12 DIAGNOSIS — D0512 Intraductal carcinoma in situ of left breast: Secondary | ICD-10-CM

## 2021-05-12 DIAGNOSIS — I10 Essential (primary) hypertension: Secondary | ICD-10-CM | POA: Diagnosis not present

## 2021-05-12 DIAGNOSIS — R739 Hyperglycemia, unspecified: Secondary | ICD-10-CM | POA: Diagnosis not present

## 2021-05-12 DIAGNOSIS — R911 Solitary pulmonary nodule: Secondary | ICD-10-CM

## 2021-05-12 DIAGNOSIS — K5790 Diverticulosis of intestine, part unspecified, without perforation or abscess without bleeding: Secondary | ICD-10-CM

## 2021-05-12 DIAGNOSIS — Z23 Encounter for immunization: Secondary | ICD-10-CM

## 2021-05-12 DIAGNOSIS — E7800 Pure hypercholesterolemia, unspecified: Secondary | ICD-10-CM | POA: Diagnosis not present

## 2021-05-12 DIAGNOSIS — M8580 Other specified disorders of bone density and structure, unspecified site: Secondary | ICD-10-CM

## 2021-05-12 DIAGNOSIS — E876 Hypokalemia: Secondary | ICD-10-CM

## 2021-05-12 DIAGNOSIS — K429 Umbilical hernia without obstruction or gangrene: Secondary | ICD-10-CM

## 2021-05-12 DIAGNOSIS — N8110 Cystocele, unspecified: Secondary | ICD-10-CM

## 2021-05-12 DIAGNOSIS — D649 Anemia, unspecified: Secondary | ICD-10-CM

## 2021-05-12 LAB — BASIC METABOLIC PANEL
BUN: 9 mg/dL (ref 6–23)
CO2: 29 mEq/L (ref 19–32)
Calcium: 9.5 mg/dL (ref 8.4–10.5)
Chloride: 104 mEq/L (ref 96–112)
Creatinine, Ser: 0.62 mg/dL (ref 0.40–1.20)
GFR: 86.88 mL/min (ref >60.00)
Glucose, Bld: 112 mg/dL — ABNORMAL HIGH (ref 70–99)
Potassium: 4 mEq/L (ref 3.5–5.1)
Sodium: 140 mEq/L (ref 135–145)

## 2021-05-12 LAB — TSH: TSH: 0.59 u[IU]/mL (ref 0.35–5.50)

## 2021-05-12 LAB — HEMOGLOBIN A1C: Hgb A1c MFr Bld: 5.7 % (ref 4.6–6.5)

## 2021-05-12 NOTE — Progress Notes (Signed)
Patient ID: Helen Marshall, female   DOB: [birth year], 75 y.o.   MRN: [id number]   Subjective:    Patient ID: Helen Marshall, female    DOB: 1946-04-14, 75 y.o.   MRN: [id number]  This visit occurred during the SARS-CoV-2 public health emergency.  Safety protocols were in place, including screening questions prior to the visit, additional usage of staff PPE, and extensive cleaning of exam room while observing appropriate contact time as indicated for disinfecting solutions.   Patient here for a scheduled follow up.  Marland Kitchen   HPI Here to follow up regarding her blood pressure and cholesterol.  Was seen 04/26/21 in ER and diagnosed with diverticulitis and umbilical hernia.  Treated with cipro and flagyl initially.  Dr Hilarie Fredrickson changed to augmentin.  GI symptoms better.  Eating.  No abdominal pain.  Has scheduled f/u 05/28/21.  No nausea or vomiting.  No chest pain or sob reported.  Off hctz.  Seeing Dr Gabriel Carina.  Received reclast.  Planning f/u bone density.  Handling stress.  Discussed.  Stress with her son's work.  Does not feel she needs any further intervention.  Left low back and left buttock pain - f/u Dr Christella Noa.    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   Tubular adenoma of colon    Past Surgical History:  Procedure  Laterality Date   ABDOMINAL HYSTERECTOMY  1989   fibroids   ANKLE SURGERY Left 1999   BREAST SURGERY Left 1970   biopsy   CATARACT EXTRACTION, BILATERAL  June & July 2017   CHOLECYSTECTOMY  2001   COLONOSCOPY  06/02/2011   Verdie Shire, MD; diverticulosis, submucosal lipoma of the sigmoid colon.   GALLBLADDER SURGERY     HERNIA REPAIR  2001, 2004   HERNIA REPAIR  01/22/2013   Repeat repair of umbilical defect, 2.5 cm. Primary repair.   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   shoulder   TUBAL LIGATION  1976   UPPER GI ENDOSCOPY  08/22/2014   Dr Billie Lade   Family History  Problem Relation Age of Onset   CVA Father    Alcohol abuse Father    Heart disease Mother        myocardial infarction   Diabetes Mother    Hearing loss Mother    Hypertension Brother        x2   Arthritis Brother    Hearing loss Brother    Alcohol abuse Brother    Arthritis Brother    Hearing loss Brother    Arthritis/Rheumatoid  Sister        x2   Breast cancer Sister 70   Arthritis Sister    Hearing loss Sister    Arthritis Sister    Hearing loss Sister    Asthma Sister    Arthritis Sister    Hearing loss Sister    Hypothyroidism Sister    Arthritis Sister    Hearing loss Sister    Cancer Other        breast - paternal first cousin   Colon cancer Maternal Uncle    Colon polyps Son    Colon polyps Sister    Arthritis Sister    Hearing loss Sister    Social History   Socioeconomic History   Marital status: Married    Spouse name: Not on file   Number of children: 2   Years of education: Not on file   Highest education level: Not on file  Occupational History   Occupation: Retired  Tobacco Use   Smoking status: Never   Smokeless tobacco: Never  Vaping Use   Vaping Use: Never used  Substance and Sexual Activity   Alcohol use: No    Alcohol/week: 0.0 standard drinks   Drug use: No    Sexual activity: Not Currently  Other Topics Concern   Not on file  Social History Narrative   Not on file   Social Determinants of Health   Financial Resource Strain: Low Risk    Difficulty of Paying Living Expenses: Not hard at all  Food Insecurity: No Food Insecurity   Worried About Charity fundraiser in the Last Year: Never true   [place] in the Last Year: Never true  Transportation Needs: No Transportation Needs   Lack of Transportation (Medical): No   Lack of Transportation (Non-Medical): No  Physical Activity: Not on file  Stress: No Stress Concern Present   Feeling of Stress : Not at all  Social Connections: Unknown   Frequency of Communication with Friends and Family: Not on file   Frequency of Social Gatherings with Friends and Family: Not on file   Attends Religious Services: Not on file   Active Member of Clubs or Organizations: Not on file   Attends Archivist Meetings: Not on file   Marital Status: Married     Review of Systems  Constitutional:  Negative for appetite change and unexpected weight change.  HENT:  Negative for congestion and sinus pressure.   Respiratory:  Negative for cough, chest tightness and shortness of breath.   Cardiovascular:  Negative for chest pain, palpitations and leg swelling.  Gastrointestinal:  Negative for nausea and vomiting.       No abdominal pain now.  Bowels stable.   Genitourinary:  Negative for difficulty urinating and dysuria.  Musculoskeletal:  Negative for joint swelling and myalgias.       Left low back and buttock pain.    Skin:  Negative for color change and rash.  Neurological:  Negative for dizziness, light-headedness and headaches.  Psychiatric/Behavioral:  Negative for agitation and dysphoric mood.       Objective:     BP 122/67 (BP Location: Left Arm, Patient Position: Sitting, Cuff Size: Normal)   Pulse 74   Temp 97.8 F (36.6 C) (Oral)   Ht _0  (1.575 m)   Wt 174 lb 12.8 oz (79.3  kg)   LMP 07/21/1988   SpO2 97%   BMI 31.97 kg/m  Wt Readings from Last 3 Encounters:  05/12/21 174 lb 12.8 oz (79.3 kg)  04/26/21 175 lb (79.4 kg)  04/10/21 173 lb (78.5 kg)    Physical Exam Vitals reviewed.  Constitutional:      General: She is not in acute distress.    Appearance: Normal appearance.  HENT:     Head: Normocephalic and atraumatic.     Right Ear: External ear normal.     Left Ear: External ear normal.  Eyes:     General: No scleral icterus.       Right eye: No discharge.        Left eye: No discharge.     Conjunctiva/sclera: Conjunctivae normal.  Neck:     Thyroid: No thyromegaly.  Cardiovascular:     Rate and Rhythm: Normal rate and regular rhythm.  Pulmonary:     Effort: No respiratory distress.     Breath sounds: Normal breath sounds. No wheezing.  Abdominal:     General: Bowel sounds are normal.     Palpations: Abdomen is soft.     Tenderness: There is no abdominal tenderness.  Musculoskeletal:        General: No swelling or tenderness.     Cervical back: Neck supple. No tenderness.  Lymphadenopathy:     Cervical: No cervical adenopathy.  Skin:    Findings: No erythema or rash.  Neurological:     Mental Status: She is alert.  Psychiatric:        Mood and Affect: Mood normal.        Behavior: Behavior normal.     Outpatient Encounter Medications as of 05/12/2021  Medication Sig   albuterol (VENTOLIN HFA) 108 (90 Base) MCG/ACT inhaler Inhale 2 puffs into the lungs every 6 (six) hours as needed for wheezing or shortness of breath.   amLODipine (NORVASC) 5 MG tablet Take 1 tablet (5 mg total) by mouth daily.   benazepril (LOTENSIN) 40 MG tablet Take 1 tablet (40 mg total) by mouth daily.   calcium-vitamin D (OSCAL-500) 500-400 MG-UNIT tablet Take 1 tablet by mouth daily.    ezetimibe (ZETIA) 10 MG tablet Take 1 tablet (10 mg total) by mouth daily.   Fe Fum-FePoly-Vit C-Vit B3 (INTEGRA) 62.5-62.5-40-3 MG CAPS Take 1 tablet by mouth daily.    fish oil-omega-3 fatty acids 1000 MG capsule 5 (five) times daily.    fluticasone (FLONASE) 50 MCG/ACT nasal spray Place 2 sprays into both nostrils daily.   fluticasone (FLONASE) 50 MCG/ACT nasal spray Place 2 sprays into both nostrils as needed.   Multiple Vitamin (MULTIVITAMIN) tablet Take 1 tablet by mouth daily.   niacin (NIASPAN) 1000 MG CR tablet Take 1,000 mg by mouth 2 (two) times daily.   potassium chloride (KLOR-CON M10) 10 MEQ tablet Take 1-2 tablets per day as directed.   Probiotic Product (VSL#3 PO) Take by mouth. 1 capsul daily   triamcinolone cream (KENALOG) 0.1 % Apply 1 application topically as needed.    [DISCONTINUED] hydrochlorothiazide (MICROZIDE) 12.5 MG capsule Take 1 capsule (12.5 mg total) by mouth daily.   [DISCONTINUED] amoxicillin-clavulanate (AUGMENTIN) 875-125 MG tablet Take 1 tablet by mouth 2 (two) times daily. (Patient not taking: Reported on 05/12/2021)   No facility-administered encounter medications on file as of 05/12/2021.     Lab Results  Component Value Date   WBC 5.8 04/26/2021   HGB 13.3 04/26/2021   HCT 38.6 04/26/2021   PLT 235 04/26/2021   GLUCOSE 112 (H) 05/12/2021   CHOL 168 11/18/2020   TRIG 113.0 11/18/2020  HDL 73.30 11/18/2020   LDLDIRECT 101.4 05/07/2013   LDLCALC 72 11/18/2020   ALT 23 04/26/2021   AST 29 04/26/2021   NA 140 05/12/2021   K 4.0 05/12/2021   CL 104 05/12/2021   CREATININE 0.62 05/12/2021   BUN 9 05/12/2021   CO2 29 05/12/2021   TSH 0.59 05/12/2021   HGBA1C 5.7 05/12/2021   MICROALBUR <0.7 04/19/2018    CT Abdomen Pelvis W Contrast  Result Date: 04/26/2021 CLINICAL DATA:  Rectal bleeding and abdominal pain EXAM: CT ABDOMEN AND PELVIS WITH CONTRAST TECHNIQUE: Multidetector CT imaging of the abdomen and pelvis was performed using the standard protocol following bolus administration of intravenous contrast. CONTRAST:  70m OMNIPAQUE IOHEXOL 350 MG/ML SOLN COMPARISON:  None. FINDINGS: Lower chest: Right lower  lobe lung nodule measures 5 mm, image 6/4. Hepatobiliary: Inferior right lobe of liver cyst measures 1.6 cm. Within segment 4 there is a focal area of low attenuation adjacent to the falciform ligament measuring 2.0 x 1.3 cm, image 24/2. Likely focal fatty deposition. Mild intrahepatic bile duct dilatation. Status post cholecystectomy. No signs of obstructing mass or choledocholithiasis. Pancreas: Unremarkable. No pancreatic ductal dilatation or surrounding inflammatory changes. Spleen: Normal in size without focal abnormality. Adrenals/Urinary Tract: Normal adrenal glands. Tiny, less than 1 cm renal hypodensities are too small to characterize. No suspicious mass, nephrolithiasis or hydronephrosis. Urinary bladder unremarkable. Stomach/Bowel: Stomach normal. The appendix is not confidently identified and may be surgically absent. No pericecal inflammation. Sigmoid diverticulosis. Focally inflamed diverticula identified involving the mid sigmoid colon within the right lower quadrant of the abdomen, image 68/2. Surrounding inflammatory soft tissue stranding is identified. No signs of perforation. No abscess identified. There is a large umbilical hernia which contains fat as well as a loop of small bowel. The herniated small bowel loops appear mildly increased in caliber scratch set the herniated small bowel loops are nondilated but there are air-fluid levels within the herniated small bowel. No signs of bowel obstruction. Vascular/Lymphatic: Aortic atherosclerosis.  No adenopathy. Reproductive: Status post hysterectomy. No adnexal masses. Other: No free fluid or fluid collections. No signs of pneumoperitoneum. Musculoskeletal: Degenerative disc disease noted within the lumbar spine. Signs of bilateral hip AVN. IMPRESSION: 1. Examination is positive for acute sigmoid diverticulitis. No signs of perforation or abscess. 2. Large umbilical hernia containing loop of a loop of non-dilated small bowel containing air-fluid  levels. No signs of significant bowel obstruction. 3. Right lower lobe lung nodule measures 5 mm. No follow-up needed if patient is low-risk. Non-contrast chest CT can be considered in 12 months if patient is high-risk. This recommendation follows the consensus statement: Guidelines for Management of Incidental Pulmonary Nodules Detected on CT Images: From the Fleischner Society 2017; Radiology 2017; 284:228-243. 4. Aortic Atherosclerosis (ICD10-I70.0). Electronically Signed   By: TKerby MoorsM.D.   On: 04/26/2021 09:35       Assessment & Plan:   Problem List Items Addressed This Visit     Abnormal liver function test    Diet and exercise.  Follow liver panel.       Anemia    Follow cbc.       Back pain    Left low back/buttock pain.  F/u with Dr CChristella Noa        Diverticulosis    Recent diverticular flare.  Treated with abx.  Better now.  No abdominal pain.  Bowels stable.  Planning f/u with GI 05/28/21.        Hypercholesterolemia    Intolerant  to statin medication.  On zetia.  Low cholesterol diet and exercise.  Follow lipid panel and liver function tests.        Relevant Orders   TSH (Completed)   Hyperglycemia    Low carb diet and exercise.  Follow met b and a1c.   Lab Results  Component Value Date   HGBA1C 5.7 05/12/2021       Relevant Orders   Hemoglobin A1c (Completed)   Hypertension - Primary    Continue amlodipine, benazepril. Off hctz.   Blood pressure has been under good control on this regimen.  Follow pressures.  Follow metabolic panel. Since off hctz.  Check potassium.  Hold potassium supplements.        Relevant Orders   Basic metabolic panel (Completed)   Hypokalemia    On potassium supplements.  Off hctz.  Recheck potassium.  Hold potassium supplements.        Lung nodule     Incidental finding CT abdomen 04/26/21.  Will plan f/u chest ct.        Neoplasm of left breast, primary tumor 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)    Has  been followed by Dr Bary Castilla.  Mammogram 09/17/20  - Birads I.        Osteopenia    Given 10 year risk of major osteoporotic fracture is >20%.  Unable to take fosamax.  Referred to endocrinology.  Since unable to tolerate fosamax, received reclast. Continue calcium, vitamin D and weight bearing exercise.        Umbilical hernia    Noted on CT. No pain to palpation.  Follow.       Vaginal prolapse    Previously saw Dr Leafy Ro.  Saw Dr Wannetta Sender - urogyn - pessary.        Other Visit Diagnoses     Need for immunization against influenza       Relevant Orders   Flu Vaccine QUAD High Dose(Fluad) (Completed)        Einar Pheasant, MD

## 2021-05-13 ENCOUNTER — Other Ambulatory Visit: Payer: Self-pay | Admitting: Internal Medicine

## 2021-05-13 DIAGNOSIS — E7800 Pure hypercholesterolemia, unspecified: Secondary | ICD-10-CM

## 2021-05-13 DIAGNOSIS — E876 Hypokalemia: Secondary | ICD-10-CM

## 2021-05-13 NOTE — Progress Notes (Signed)
Order placed for f/u labs.  

## 2021-05-17 ENCOUNTER — Encounter: Payer: Self-pay | Admitting: Internal Medicine

## 2021-05-17 NOTE — Assessment & Plan Note (Signed)
Left low back/buttock pain.  F/u with Dr Christella Noa.

## 2021-05-17 NOTE — Assessment & Plan Note (Signed)
Incidental finding CT abdomen 04/26/21.  Will plan f/u chest ct.

## 2021-05-17 NOTE — Assessment & Plan Note (Signed)
Has been followed by Dr Bary Castilla.  Mammogram 09/17/20  - Birads I.

## 2021-05-17 NOTE — Assessment & Plan Note (Signed)
Recent diverticular flare.  Treated with abx.  Better now.  No abdominal pain.  Bowels stable.  Planning f/u with GI 05/28/21.

## 2021-05-17 NOTE — Assessment & Plan Note (Signed)
Diet and exercise.  Follow liver panel.   

## 2021-05-17 NOTE — Assessment & Plan Note (Signed)
Follow cbc.  

## 2021-05-17 NOTE — Assessment & Plan Note (Signed)
Previously saw Dr Leafy Ro.  Saw Dr Wannetta Sender - urogyn - pessary.

## 2021-05-17 NOTE — Assessment & Plan Note (Signed)
Intolerant to statin medication.  On zetia.  Low cholesterol diet and exercise.  Follow lipid panel and liver function tests.   

## 2021-05-17 NOTE — Assessment & Plan Note (Signed)
Noted on CT. No pain to palpation.  Follow.

## 2021-05-17 NOTE — Assessment & Plan Note (Signed)
Given 10 year risk of major osteoporotic fracture is >20%.  Unable to take fosamax.  Referred to endocrinology.  Since unable to tolerate fosamax, received reclast. Continue calcium, vitamin D and weight bearing exercise.

## 2021-05-17 NOTE — Assessment & Plan Note (Signed)
Continue amlodipine, benazepril. Off hctz.   Blood pressure has been under good control on this regimen.  Follow pressures.  Follow metabolic panel. Since off hctz.  Check potassium.  Hold potassium supplements.

## 2021-05-17 NOTE — Assessment & Plan Note (Signed)
Low carb diet and exercise.  Follow met b and a1c.   °Lab Results  °Component Value Date  ° HGBA1C 5.7 05/12/2021  ° °

## 2021-05-17 NOTE — Assessment & Plan Note (Signed)
On potassium supplements.  Off hctz.  Recheck potassium.  Hold potassium supplements.

## 2021-05-26 ENCOUNTER — Ambulatory Visit: Payer: Medicare Other | Admitting: Obstetrics and Gynecology

## 2021-05-28 ENCOUNTER — Other Ambulatory Visit (INDEPENDENT_AMBULATORY_CARE_PROVIDER_SITE_OTHER): Payer: Medicare Other

## 2021-05-28 ENCOUNTER — Encounter: Payer: Self-pay | Admitting: Physician Assistant

## 2021-05-28 ENCOUNTER — Ambulatory Visit (INDEPENDENT_AMBULATORY_CARE_PROVIDER_SITE_OTHER): Payer: Medicare Other | Admitting: Physician Assistant

## 2021-05-28 VITALS — BP 118/68 | HR 60 | Ht 62.0 in | Wt 173.0 lb

## 2021-05-28 DIAGNOSIS — Z86010 Personal history of colonic polyps: Secondary | ICD-10-CM | POA: Diagnosis not present

## 2021-05-28 DIAGNOSIS — Z862 Personal history of diseases of the blood and blood-forming organs and certain disorders involving the immune mechanism: Secondary | ICD-10-CM | POA: Diagnosis not present

## 2021-05-28 DIAGNOSIS — K429 Umbilical hernia without obstruction or gangrene: Secondary | ICD-10-CM

## 2021-05-28 DIAGNOSIS — Z8719 Personal history of other diseases of the digestive system: Secondary | ICD-10-CM | POA: Diagnosis not present

## 2021-05-28 DIAGNOSIS — Z860101 Personal history of adenomatous and serrated colon polyps: Secondary | ICD-10-CM

## 2021-05-28 LAB — IBC PANEL
Iron: 89 ug/dL (ref 42–145)
Saturation Ratios: 26.3 % (ref 20.0–50.0)
TIBC: 338.8 ug/dL (ref 250.0–450.0)
Transferrin: 242 mg/dL (ref 212.0–360.0)

## 2021-05-28 LAB — FERRITIN: Ferritin: 140.6 ng/mL (ref 10.0–291.0)

## 2021-05-28 MED ORDER — SUTAB 1479-225-188 MG PO TABS: 1.0000 | ORAL_TABLET | ORAL | 0 refills | Status: DC

## 2021-05-28 NOTE — Patient Instructions (Signed)
If you are age 76 or older, your body mass index should be between 23-30. Your Body mass index is 31.64 kg/m. If this is out of the aforementioned range listed, please consider follow up with your Primary Care Provider. ________________________________________________________  The [facility] providers would like to encourage you to use [facility] to communicate with providers for non-urgent requests or questions.  Due to long hold times on the telephone, sending your provider a message by [facility] may be a faster and more efficient way to get a response.  Please allow 48 business hours for a response.  Please remember that this is for non-urgent requests.  _______________________________________________________  Your provider has requested that you go to the basement level for lab work before leaving today. Press "B" on the elevator. The lab is located at the first door on the left as you exit the elevator.  You have been scheduled for a colonoscopy. Please follow written instructions given to you at your visit today.  Please pick up your prep supplies at the pharmacy within the next 1-3 days. If you use inhalers (even only as needed), please bring them with you on the day of your procedure.  Use Benefiber daily in a glass of water.  Try to follow a low gas diet.  Follow up pending your Colonoscopy or as needed.  Thank you for entrusting me with your care and choosing [facility].  Amy Esterwood, PA-C

## 2021-05-28 NOTE — Progress Notes (Signed)
Subjective:    Patient ID: Helen Marshall, female    DOB: [birth year], 75 y.o.   MRN: [id number]  HPI Helen Marshall is a pleasant 75 year old white female, established with Dr. Hilarie Marshall who comes in today after an ER visit on 04/26/2021.  She has history of hypertension, diverticulosis, prior history of breast cancer, umbilical hernia and GERD. She had last undergone colonoscopy in August 2017 with removal of 3 small polyps 2 of which were tubular adenomas 1 was hyperplastic.  Also noted to have multiple diverticuli in the rectosigmoid and sigmoid and external hemorrhoids. Patient says that she developed rectal bleeding which occurred in the middle of the night.  She had some mild abdominal cramping and gassiness at onset but no significant pain.  She reports passing several episodes of bloody stool, and therefore proceeded to the emergency room in the morning. Her labs were unrevealing with WBC of 5.8, hemoglobin 13.3/hematocrit 38.6. She underwent CT of the abdomen and pelvis which showed an acute sigmoid diverticulitis with a focally inflamed diverticuli in the mid sigmoid colon with surrounding inflammatory/soft tissue stranding.  Also noted to have a large umbilical hernia containing a loop of nondilated small bowel, and a right lower lobe lung nodule measuring 5 mm, no follow-up needed if patient low risk/noncontrast chest CT in 12 months of high risk.  Patient was started on a course of Cipro and Flagyl, however this caused some nausea and abdominal pain and she called here and was switched to a course of Augmentin which she completed without difficulty. She said she did not have any further bleeding after that initial day.  Interestingly she really had not had any abdominal pain either.  She says she feels fine at this point and is not having any abdominal discomfort.  Bowel movements have been normal,  She mentions that her stools stay dark at times because she has been on chronic iron prescribed by  her PCP a couple of years ago apparently for anemia.  Review of her labs shows that she has not had any iron studies done for some time.   Review of Systems Pertinent positive and negative review of systems were noted in the above HPI section.  All other review of systems was otherwise negative.   Outpatient Encounter Medications as of 05/28/2021  Medication Sig   albuterol (VENTOLIN HFA) 108 (90 Base) MCG/ACT inhaler Inhale 2 puffs into the lungs every 6 (six) hours as needed for wheezing or shortness of breath.   amLODipine (NORVASC) 5 MG tablet Take 1 tablet (5 mg total) by mouth daily.   benazepril (LOTENSIN) 40 MG tablet Take 1 tablet (40 mg total) by mouth daily.   calcium-vitamin D (OSCAL-500) 500-400 MG-UNIT tablet Take 1 tablet by mouth daily.    ezetimibe (ZETIA) 10 MG tablet Take 1 tablet (10 mg total) by mouth daily.   Fe Fum-FePoly-Vit C-Vit B3 (INTEGRA) 62.5-62.5-40-3 MG CAPS Take 1 tablet by mouth daily.   fish oil-omega-3 fatty acids 1000 MG capsule 5 (five) times daily.    fluticasone (FLONASE) 50 MCG/ACT nasal spray Place 2 sprays into both nostrils as needed.   Multiple Vitamin (MULTIVITAMIN) tablet Take 1 tablet by mouth daily.   niacin (NIASPAN) 1000 MG CR tablet Take 1,000 mg by mouth 2 (two) times daily.   Probiotic Product (VSL#3 PO) Take by mouth. 1 capsul daily   Sodium Sulfate-Mag Sulfate-KCl (SUTAB) [phone] MG TABS Take 1 kit by mouth as directed.   triamcinolone cream (KENALOG) 0.1 % Apply  1 application topically as needed.    [DISCONTINUED] fluticasone (FLONASE) 50 MCG/ACT nasal spray Place 2 sprays into both nostrils daily. (Patient taking differently: Place 2 sprays into both nostrils daily as needed.)   [DISCONTINUED] potassium chloride (KLOR-CON M10) 10 MEQ tablet Take 1-2 tablets per day as directed.   No facility-administered encounter medications on file as of 05/28/2021.   Allergies  Allergen Reactions   Crestor [Rosuvastatin] Other (See Comments)     Leg cramping    Demerol  [Meperidine Hcl]    Demerol [Meperidine] Nausea And Vomiting   Gabapentin    Latex Other (See Comments)    Redness, hands breakout   Lipitor [Atorvastatin] Other (See Comments)    Intolerant    Lovastatin Other (See Comments)    Elevated CK   Pravastatin Other (See Comments)    intolerant   Tramadol Nausea Only   Vicodin [Hydrocodone-Acetaminophen] Other (See Comments)    itching   Zocor [Simvastatin] Other (See Comments)    Intolerant    Patient Active Problem List   Diagnosis Date Noted   Lung nodule 04/26/2021   Hypokalemia 11/20/2020   Back pain 07/23/2020   Tremor of both hands 03/16/2020   Vaginal prolapse 03/16/2020   Electrolyte abnormality 03/03/2020   Hand pain, left 06/06/2019   Osteopenia 05/27/2019   Estrogen deficiency 04/22/2019   Sinusitis 07/21/2017   Anemia 11/05/2015   Health care maintenance 67/07/4579   Umbilical hernia 99/83/3825   Neoplasm of left breast, 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   GERD (gastroesophageal reflux disease) 07/22/2012   Diverticulosis 07/21/2012   Hypertension 07/21/2012   Hypercholesterolemia 07/21/2012   Abnormal liver function test 07/21/2012   Left carotid bruit 07/21/2012   Hyperglycemia 07/21/2012   Environmental allergies 07/21/2012   Lumbar disc disease 07/21/2012   Social History   Socioeconomic History   Marital status: Married    Spouse name: Not on file   Number of children: 2   Years of education: Not on file   Highest education level: Not on file  Occupational History   Occupation: Retired  Tobacco Use   Smoking status: Never   Smokeless tobacco: Never  Vaping Use   Vaping Use: Never used  Substance and Sexual Activity   Alcohol use: No    Alcohol/week: 0.0 standard drinks   Drug use: No   Sexual activity: Not Currently  Other Topics Concern   Not on file  Social History Narrative   Not on file   Social  Determinants of Health   Financial Resource Strain: Low Risk    Difficulty of Paying Living Expenses: Not hard at all  Food Insecurity: No Food Insecurity   Worried About Charity fundraiser in the Last Year: Never true   Arboriculturist in the Last Year: Never true  Transportation Needs: No Transportation Needs   Lack of Transportation (Medical): No   Lack of Transportation (Non-Medical): No  Physical Activity: Not on file  Stress: No Stress Concern Present   Feeling of Stress : Not at all  Social Connections: Unknown   Frequency of Communication with Friends and Family: Not on file   Frequency of Social Gatherings with Friends and Family: Not on file   Attends Religious Services: Not on file   Active Member of Clubs or Organizations: Not on file   Attends Archivist Meetings: Not on file   Marital Status: Married  Intimate Partner Violence: Not At Risk  Fear of Current or Ex-Partner: No   Emotionally Abused: No   Physically Abused: No   Sexually Abused: No    Ms. Wurster's family history includes Alcohol abuse in her brother and father; Arthritis in her brother, brother, sister, sister, sister, sister, and sister; Arthritis/Rheumatoid in her sister; Asthma in her sister; Breast cancer (age of onset: 15) in her sister; CVA in her father; Cancer in an other family member; Colon cancer in her maternal uncle; Colon polyps in her sister and son; Diabetes in her mother; Hearing loss in her brother, brother, mother, sister, sister, sister, sister, and sister; Heart disease in her mother; Hypertension in her brother; Hypothyroidism in her sister.      Objective:    Vitals:   05/28/21 1037  BP: 118/68  Pulse: 60  SpO2: 99%    Physical Exam Well-developed well-nourished older white female in no acute distress.  Height, Weight, 173 BMI 31.6  HEENT; nontraumatic normocephalic, EOMI, PE R LA, sclera anicteric. Oropharynx; not examined today Neck; supple, no  JVD Cardiovascular; regular rate and rhythm with S1-S2, no murmur rub or gallop Pulmonary; Clear bilaterally Abdomen; soft, obese, nontender, nondistended, no palpable mass or hepatosplenomegaly, bowel sounds are active Rectal; not done today Skin; benign exam, no jaundice rash or appreciable lesions Extremities; no clubbing cyanosis or edema skin warm and dry Neuro/Psych; alert and oriented x4, grossly nonfocal mood and affect appropriate        Assessment & Plan:   #44 75 year old white female with recent episode of hematochezia, and mild abdominal pain, requiring ER visit.  Work-up with CT of the abdomen pelvis consistent with a focal sigmoid diverticulitis with some surrounding stranding.  Interestingly her primary symptom was several episodes of hematochezia which resolved within 24 hours. She completed a course of Augmentin and is currently asymptomatic.  #2 history of adenomatous colon polyps-last colonoscopy August 2017 due for interval follow-up #3 prior history of breast cancer 4.  Hypertension 5.  Recurrent umbilical hernia-currently asymptomatic  Plan; We discussed high-fiber diet, add Benefiber 2 scoops daily in a glass of water. Patient has had some issues with gassiness, she was provided with a low gas diet for review. Patient will be scheduled for colonoscopy with Dr. Hilarie Marshall.  Procedure was discussed in detail with the patient including indications risk and benefits and she is agreeable to proceed. Check iron studies today as she has been on iron supplementation over the past couple of years.   Marilea Gwynne S Michela Herst PA-C 05/28/2021   Cc: Einar Pheasant, MD

## 2021-06-02 NOTE — Progress Notes (Signed)
Addendum: Reviewed and agree with assessment and management plan. Alexandrea Westergard M, MD  

## 2021-06-03 ENCOUNTER — Other Ambulatory Visit: Payer: Medicare Other

## 2021-06-09 ENCOUNTER — Other Ambulatory Visit (INDEPENDENT_AMBULATORY_CARE_PROVIDER_SITE_OTHER): Payer: Medicare Other

## 2021-06-09 ENCOUNTER — Other Ambulatory Visit: Payer: Self-pay

## 2021-06-09 DIAGNOSIS — E876 Hypokalemia: Secondary | ICD-10-CM

## 2021-06-09 DIAGNOSIS — E7800 Pure hypercholesterolemia, unspecified: Secondary | ICD-10-CM | POA: Diagnosis not present

## 2021-06-09 LAB — POTASSIUM: Potassium: 4 mEq/L (ref 3.5–5.1)

## 2021-06-09 LAB — LIPID PANEL
Cholesterol: 214 mg/dL — ABNORMAL HIGH (ref 0–200)
HDL: 81.2 mg/dL (ref >39.00)
LDL Cholesterol: 117 mg/dL — ABNORMAL HIGH (ref 0–99)
NonHDL: 132.59
Total CHOL/HDL Ratio: 3
Triglycerides: 76 mg/dL (ref 0.0–149.0)
VLDL: 15.2 mg/dL (ref 0.0–40.0)

## 2021-06-12 ENCOUNTER — Ambulatory Visit: Payer: Self-pay | Admitting: Obstetrics and Gynecology

## 2021-06-16 ENCOUNTER — Other Ambulatory Visit: Payer: Self-pay

## 2021-06-16 ENCOUNTER — Ambulatory Visit (INDEPENDENT_AMBULATORY_CARE_PROVIDER_SITE_OTHER): Payer: Medicare Other | Admitting: Obstetrics and Gynecology

## 2021-06-16 VITALS — BP 145/81 | HR 72 | Ht 62.5 in | Wt 170.0 lb

## 2021-06-16 DIAGNOSIS — N3281 Overactive bladder: Secondary | ICD-10-CM | POA: Diagnosis not present

## 2021-06-16 DIAGNOSIS — R350 Frequency of micturition: Secondary | ICD-10-CM

## 2021-06-16 LAB — POCT URINALYSIS DIPSTICK
Appearance: NORMAL
Bilirubin, UA: NEGATIVE
Blood, UA: NEGATIVE
Glucose, UA: NEGATIVE
Ketones, UA: NEGATIVE
Leukocytes, UA: NEGATIVE
Nitrite, UA: NEGATIVE
Protein, UA: NEGATIVE
Spec Grav, UA: 1.015 (ref 1.010–1.025)
Urobilinogen, UA: 0.2 E.U./dL
pH, UA: 7 (ref 5.0–8.0)

## 2021-06-16 NOTE — Patient Instructions (Signed)

## 2021-06-18 NOTE — Progress Notes (Signed)
[facility] Urodynamics Procedure  Referring Physician: Einar Pheasant, MD Date of Procedure: 06/16/2021  Helen Marshall is a 75 y.o. female who presents for urodynamic evaluation. Indication(s) for study: UUI  Vital Signs: BP (!) 145/81   Pulse 72   Ht 5' 2.5" (1.588 m)   Wt 170 lb (77.1 kg)   LMP 07/21/1988   BMI 30.60 kg/m   Laboratory Results: A catheterized urine specimen revealed:  POC urine: negative   Voiding Diary: Not performed  Procedure Timeout:  The correct patient was verified and the correct procedure was verified. The patient was in the correct position and safety precautions were reviewed based on at the patient's history.  Urodynamic Procedure A 61F dual lumen urodynamics catheter was placed under sterile conditions into the patient's bladder. A 61F catheter was placed into the rectum in order to measure abdominal pressure. EMG patches were placed in the appropriate position.  All connections were confirmed and calibrations/adjusted made. Saline was instilled into the bladder through the dual lumen catheters.  Cough/valsalva pressures were measured periodically during filling.  Patient was allowed to void.  The bladder was then emptied of its residual.  UROFLOW: Revealed a Qmax of 10 mL/sec.  She voided 108 mL and had a residual of 38 mL.  It was a normal pattern and represented normal habits though interpretation limited due to low voided volume.  CMG: This was performed with sterile water in the sitting position at a fill rate of 30 mL/min.    First sensation of fullness was 96 mLs,  First urge was 123 mLs,  Strong urge was 175 mLs and  Capacity was 380 mLs  Stress incontinence was not demonstrated Highest negative Barrier CLPP was 109 cmH20 at 255 ml in the standing position. Highest negative Barrier VLPP was 76 cmH20 at 255 ml in the standing position..  Detrusor function was overactive, with phasic contractions seen.  The first  occurred at 174 mL to 9 cm of water and was not associated with leakage. She leaked at capacity, and Pdet was 8.8 cm of water  Compliance:  decreased. End fill detrusor pressure was 14cmH20.  Calculated compliance was 37mL/cmH20  UPP: MUCP with barrier reduction was 83 cm of water.    MICTURITION STUDY: Voiding was performed with reduction using scopettes in the sitting position.  Pdet at Qmax was 39 cm of water.  Qmax was 23 mL/sec.  It was a normal pattern.  She voided 364 mL and had a residual of 15 mL.  It was a volitional void, sustained detrusor contraction was present and abdominal straining was not present  EMG: This was performed with patches.  She had voluntary contractions, recruitment with fill was present and urethral sphincter was relaxed with void.  The details of the procedure with the study tracings have been scanned into EPIC.   Urodynamic Impression:  1. Sensation was increased; capacity was normal 2. Stress Incontinence was not demonstrated. 3. Detrusor Overactivity was demonstrated with leakage. 4. Emptying was normal with a normal PVR, a sustained detrusor contraction present,  abdominal straining not present, normal urethral sphincter activity on EMG.  Plan: - The patient will follow up  to discuss the findings and treatment options.

## 2021-06-29 ENCOUNTER — Ambulatory Visit (AMBULATORY_SURGERY_CENTER): Payer: Medicare Other | Admitting: Internal Medicine

## 2021-06-29 ENCOUNTER — Encounter: Payer: Self-pay | Admitting: Internal Medicine

## 2021-06-29 VITALS — BP 154/60 | HR 70 | Temp 97.7°F | Resp 14 | Ht 62.0 in | Wt 173.0 lb

## 2021-06-29 DIAGNOSIS — D120 Benign neoplasm of cecum: Secondary | ICD-10-CM

## 2021-06-29 DIAGNOSIS — D122 Benign neoplasm of ascending colon: Secondary | ICD-10-CM | POA: Diagnosis not present

## 2021-06-29 DIAGNOSIS — Z8719 Personal history of other diseases of the digestive system: Secondary | ICD-10-CM | POA: Diagnosis not present

## 2021-06-29 DIAGNOSIS — Z86010 Personal history of colonic polyps: Secondary | ICD-10-CM | POA: Diagnosis not present

## 2021-06-29 MED ORDER — SODIUM CHLORIDE 0.9 % IV SOLN: 500.0000 mL | Freq: Once | INTRAVENOUS | Status: DC

## 2021-06-29 NOTE — Progress Notes (Signed)
GASTROENTEROLOGY PROCEDURE H&P NOTE   Primary Care Physician: Einar Pheasant, MD    Reason for Procedure:  Recent episode of hematochezia with abdominal pain, history of diverticulitis, personal history of colon polyps  Plan:    Colonoscopy  Patient is appropriate for endoscopic procedure(s) in the ambulatory ([facility]) setting.  The nature of the procedure, as well as the risks, benefits, and alternatives were carefully and thoroughly reviewed with the patient. Ample time for discussion and questions allowed. The patient understood, was satisfied, and agreed to proceed.     HPI: Helen Marshall is a 75 y.o. female who presents for colonoscopy.  See note dated 05/28/2021 for details when she was seen by Nicoletta Ba, PA-C.  Tolerated the prep.  No recent chest pain or shortness of breath.  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   Tubular adenoma of colon    Umbilical hernia 22/9798    Past Surgical History:  Procedure Laterality Date   ABDOMINAL HYSTERECTOMY  1989   fibroids   ANKLE SURGERY Left 1999   BREAST SURGERY Left 1970   biopsy   CATARACT EXTRACTION, BILATERAL  June & July 2017   CHOLECYSTECTOMY  2001   COLONOSCOPY  06/02/2011   Verdie Shire, MD; diverticulosis, submucosal  lipoma of the sigmoid colon.   GALLBLADDER SURGERY     HERNIA REPAIR  2001, 2004   HERNIA REPAIR  01/22/2013   Repeat repair of umbilical defect, 2.5 cm. Primary repair.   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   shoulder   TUBAL LIGATION  1976   UPPER GI ENDOSCOPY  08/22/2014   Dr Billie Lade    Prior to Admission medications   Medication Sig Start Date End Date Taking? Authorizing Provider  amLODipine (NORVASC) 5 MG tablet Take 1 tablet (5 mg total) by mouth daily. 12/15/20  Yes Einar Pheasant, MD  benazepril (LOTENSIN) 40 MG tablet Take 1 tablet (40 mg total) by mouth daily. 12/15/20  Yes Einar Pheasant, MD  calcium-vitamin D (OSCAL-500) 500-400 MG-UNIT tablet Take 1 tablet by mouth daily.    Yes [provider]  ezetimibe (ZETIA) 10 MG tablet Take 1 tablet (10 mg total) by mouth daily. 12/15/20  Yes Einar Pheasant, MD  fish oil-omega-3 fatty acids 1000 MG capsule 5 (five) times daily.    Yes [provider]  fluticasone (FLONASE) 50 MCG/ACT nasal spray Place 2 sprays into both nostrils as needed. 12/15/20  Yes Einar Pheasant, MD  Multiple Vitamin (MULTIVITAMIN) tablet Take 1 tablet by mouth daily.  Yes [provider]  niacin (NIASPAN) 1000 MG CR tablet Take 1,000 mg by mouth 2 (two) times daily.   Yes [provider]  Probiotic Product (VSL#3 PO) Take by mouth. 1 capsul daily   Yes [provider]  triamcinolone cream (KENALOG) 0.1 % Apply 1 application topically as needed.  07/08/14  Yes [provider]  albuterol (VENTOLIN HFA) 108 (90 Base) MCG/ACT inhaler Inhale 2 puffs into the lungs every 6 (six) hours as needed for wheezing or shortness of breath. Patient not taking: Reported on 06/16/2021 12/15/20   Einar Pheasant, MD    Current Outpatient Medications  Medication Sig Dispense Refill   amLODipine (NORVASC) 5 MG tablet Take 1  tablet (5 mg total) by mouth daily. 90 tablet 3   benazepril (LOTENSIN) 40 MG tablet Take 1 tablet (40 mg total) by mouth daily. 90 tablet 3   calcium-vitamin D (OSCAL-500) 500-400 MG-UNIT tablet Take 1 tablet by mouth daily.      ezetimibe (ZETIA) 10 MG tablet Take 1 tablet (10 mg total) by mouth daily. 90 tablet 1   fish oil-omega-3 fatty acids 1000 MG capsule 5 (five) times daily.      fluticasone (FLONASE) 50 MCG/ACT nasal spray Place 2 sprays into both nostrils as needed. 48 g 3   Multiple Vitamin (MULTIVITAMIN) tablet Take 1 tablet by mouth daily.     niacin (NIASPAN) 1000 MG CR tablet Take 1,000 mg by mouth 2 (two) times daily.     Probiotic Product (VSL#3 PO) Take by mouth. 1 capsul daily     triamcinolone cream (KENALOG) 0.1 % Apply 1 application topically as needed.      albuterol (VENTOLIN HFA) 108 (90 Base) MCG/ACT inhaler Inhale 2 puffs into the lungs every 6 (six) hours as needed for wheezing or shortness of breath. (Patient not taking: Reported on 06/16/2021) 8 g 0   Current Facility-Administered Medications  Medication Dose Route Frequency Provider Last Rate Last Admin   0.9 %  sodium chloride infusion  500 mL Intravenous Once Montee Tallman, Lajuan Lines, MD        Allergies as of 06/29/2021 - Review Complete 06/29/2021  Allergen Reaction Noted   Crestor [rosuvastatin] Other (See Comments) 07/21/2012   Demerol  [meperidine hcl]  07/31/2018   Demerol [meperidine] Nausea And Vomiting 07/21/2012   Gabapentin  07/31/2018   Latex Other (See Comments) 07/21/2012   Lipitor [atorvastatin] Other (See Comments) 07/21/2012   Lovastatin Other (See Comments) 07/21/2012   Pravastatin Other (See Comments) 07/21/2012   Tramadol Nausea Only 02/06/2013   Vicodin [hydrocodone-acetaminophen] Other (See Comments) 07/21/2012   Zocor [simvastatin] Other (See Comments) 07/21/2012    Family History  Problem Relation Age of Onset   CVA Father    Alcohol abuse Father    Heart disease Mother         myocardial infarction   Diabetes Mother    Hearing loss Mother    Hypertension Brother        x2   Arthritis Brother    Hearing loss Brother    Alcohol abuse Brother    Arthritis Brother    Hearing loss Brother    Arthritis/Rheumatoid Sister        x2   Breast cancer Sister 62   Arthritis Sister    Hearing loss Sister    Arthritis Sister    Hearing loss Sister    Asthma Sister    Arthritis Sister    Hearing loss Sister    Hypothyroidism  Sister    Arthritis Sister    Hearing loss Sister    Cancer Other        breast - paternal first cousin   Colon cancer Maternal Uncle    Colon polyps Son    Colon polyps Sister    Arthritis Sister    Hearing loss Sister     Social History   Socioeconomic History   Marital status: Married    Spouse name: Not on file   Number of children: 2   Years of education: Not on file   Highest education level: Not on file  Occupational History   Occupation: Retired  Tobacco Use   Smoking status: Never   Smokeless tobacco: Never  Vaping Use   Vaping Use: Never used  Substance and Sexual Activity   Alcohol use: No    Alcohol/week: 0.0 standard drinks   Drug use: No   Sexual activity: Not Currently  Other Topics Concern   Not on file  Social History Narrative   Not on file   Social Determinants of Health   Financial Resource Strain: Low Risk    Difficulty of Paying Living Expenses: Not hard at all  Food Insecurity: No Food Insecurity   Worried About Charity fundraiser in the Last Year: Never true   [place] in the Last Year: Never true  Transportation Needs: No Transportation Needs   Lack of Transportation (Medical): No   Lack of Transportation (Non-Medical): No  Physical Activity: Not on file  Stress: No Stress Concern Present   Feeling of Stress : Not at all  Social Connections: Unknown   Frequency of Communication with Friends and Family: Not on file   Frequency of Social Gatherings with Friends and Family: Not on  file   Attends Religious Services: Not on file   Active Member of Clubs or Organizations: Not on file   Attends Archivist Meetings: Not on file   Marital Status: Married  Human resources officer Violence: Not At Risk   Fear of Current or Ex-Partner: No   Emotionally Abused: No   Physically Abused: No   Sexually Abused: No    Physical Exam: Vital signs in last 24 hours: @BP  (!) 156/87   Pulse 72   Temp 97.7 F (36.5 C) (Temporal)   Ht 5\' 2"  (1.575 m)   Wt 173 lb (78.5 kg)   LMP 07/21/1988   SpO2 94%   BMI 31.64 kg/m  GEN: NAD EYE: Sclerae anicteric ENT: MMM CV: Non-tachycardic Pulm: CTA b/l GI: Soft, NT/ND NEURO:  Alert & Oriented x 3   Zenovia Jarred, MD [facility]  06/29/2021 2:45 PM

## 2021-06-29 NOTE — Progress Notes (Signed)
Called to room to assist during endoscopic procedure.  Patient ID and intended procedure confirmed with present staff. Received instructions for my participation in the procedure from the performing physician.  

## 2021-06-29 NOTE — Progress Notes (Signed)
VS completed by CW.   Pt's states no medical or surgical changes since previsit or office visit.  

## 2021-06-29 NOTE — Progress Notes (Signed)
A/ox3, pleased with MAC, report to RN 

## 2021-06-29 NOTE — Patient Instructions (Signed)
Discharge instructions given. Handouts on polyps,Diverticulosis and Hemorrhoids. Resume previous medications. YOU HAD AN ENDOSCOPIC PROCEDURE TODAY AT [facility]:   Refer to the procedure report that was given to you for any specific questions about what was found during the examination.  If the procedure report does not answer your questions, please call your gastroenterologist to clarify.  If you requested that your care partner not be given the details of your procedure findings, then the procedure report has been included in a sealed envelope for you to review at your convenience later.  YOU SHOULD EXPECT: Some feelings of bloating in the abdomen. Passage of more gas than usual.  Walking can help get rid of the air that was put into your GI tract during the procedure and reduce the bloating. If you had a lower endoscopy (such as a colonoscopy or flexible sigmoidoscopy) you may notice spotting of blood in your stool or on the toilet paper. If you underwent a bowel prep for your procedure, you may not have a normal bowel movement for a few days.  Please Note:  You might notice some irritation and congestion in your nose or some drainage.  This is from the oxygen used during your procedure.  There is no need for concern and it should clear up in a day or so.  SYMPTOMS TO REPORT IMMEDIATELY:  Following lower endoscopy (colonoscopy or flexible sigmoidoscopy):  Excessive amounts of blood in the stool  Significant tenderness or worsening of abdominal pains  Swelling of the abdomen that is new, acute  Fever of 100F or higher   For urgent or emergent issues, a gastroenterologist can be reached at any hour by calling [phone]. Do not use MyChart messaging for urgent concerns.    DIET:  We do recommend a small meal at first, but then you may proceed to your regular diet.  Drink plenty of fluids but you should avoid alcoholic beverages for 24 hours.  ACTIVITY:  You should  plan to take it easy for the rest of today and you should NOT DRIVE or use heavy machinery until tomorrow (because of the sedation medicines used during the test).    FOLLOW UP: Our staff will call the number listed on your records 48-72 hours following your procedure to check on you and address any questions or concerns that you may have regarding the information given to you following your procedure. If we do not reach you, we will leave a message.  We will attempt to reach you two times.  During this call, we will ask if you have developed any symptoms of COVID 19. If you develop any symptoms (ie: fever, flu-like symptoms, shortness of breath, cough etc.) before then, please call [phone].  If you test positive for Covid 19 in the 2 weeks post procedure, please call and report this information to [place].    If any biopsies were taken you will be contacted by phone or by letter within the next 1-3 weeks.  Please call us at [phone] if you have not heard about the biopsies in 3 weeks.    SIGNATURES/CONFIDENTIALITY: You and/or your care partner have signed paperwork which will be entered into your electronic medical record.  These signatures attest to the fact that that the information above on your After Visit Summary has been reviewed and is understood.  Full responsibility of the confidentiality of this discharge information lies with you and/or your care-partner.

## 2021-06-29 NOTE — Op Note (Signed)
[facility] Patient Name: Helen Marshall Procedure Date: 06/29/2021 2:38 PM MRN: [id number] Endoscopist: Jerene Bears , MD Age: 75 Referring MD:  Date of Birth: Apr 06, 1946 Gender: Female Account #: [id number] Procedure:                Colonoscopy Indications:              High risk colon cancer surveillance: Personal                            history of non-advanced adenomas, Last colonoscopy:                            August 2017; recent isolated hematochezia and CT                            consistent with diverticulitis treated with                            Augmentin Medicines:                Monitored Anesthesia Care Procedure:                Pre-Anesthesia Assessment:                           - Prior to the procedure, a History and Physical                            was performed, and patient medications and                            allergies were reviewed. The patient's tolerance of                            previous anesthesia was also reviewed. The risks                            and benefits of the procedure and the sedation                            options and risks were discussed with the patient.                            All questions were answered, and informed consent                            was obtained. Prior Anticoagulants: The patient has                            taken no previous anticoagulant or antiplatelet                            agents. ASA Grade Assessment: II - A patient with  mild systemic disease. After reviewing the risks                            and benefits, the patient was deemed in                            satisfactory condition to undergo the procedure.                           After obtaining informed consent, the colonoscope                            was passed under direct vision. Throughout the                            procedure, the patient's blood pressure, pulse, and                             oxygen saturations were monitored continuously. The                            CF [id number] #[id number] was introduced through the anus                            and advanced to the cecum, identified by                            appendiceal orifice and ileocecal valve. The                            colonoscopy was performed without difficulty. The                            patient tolerated the procedure well. The quality                            of the bowel preparation was good. The ileocecal                            valve, appendiceal orifice, and rectum were                            photographed. Scope In: 2:54:18 PM Scope Out: 3:09:37 PM Scope Withdrawal Time: 0 hours 11 minutes 12 seconds  Total Procedure Duration: 0 hours 15 minutes 19 seconds  Findings:                 The digital rectal exam was normal.                           A 4 mm polyp was found in the cecum. The polyp was                            sessile. The polyp was removed with a cold snare.  Resection and retrieval were complete.                           A 2 mm polyp was found in the ascending colon. The                            polyp was sessile. The polyp was removed with a                            cold snare. Resection and retrieval were complete.                           Multiple small and large-mouthed diverticula were                            found in the sigmoid colon.                           The retroflexed view of the distal rectum and anal                            verge was normal and showed no anal or rectal                            abnormalities. Complications:            No immediate complications. Estimated Blood Loss:     Estimated blood loss was minimal. Impression:               - One 4 mm polyp in the cecum, removed with a cold                            snare. Resected and retrieved.                           - One 2 mm polyp in the  ascending colon, removed                            with a cold snare. Resected and retrieved.                           - Moderate diverticulosis in the sigmoid colon.                           - The distal rectum and anal verge are normal on                            retroflexion view. Recommendation:           - Patient has a contact number available for                            emergencies. The signs and symptoms of potential  delayed complications were discussed with the                            patient. Return to normal activities tomorrow.                            Written discharge instructions were provided to the                            patient.                           - Resume previous diet.                           - Continue present medications.                           - Await pathology results.                           - No recommendation at this time regarding repeat                            colonoscopy due to age at next surveillance                            interval and lack of significant polyps today. Jerene Bears, MD 06/29/2021 3:16:52 PM This report has been signed electronically.

## 2021-07-01 ENCOUNTER — Telehealth: Payer: Self-pay | Admitting: *Deleted

## 2021-07-01 NOTE — Telephone Encounter (Signed)
  Follow up Call-  Call back number 06/29/2021  Post procedure Call Back phone  # [phone]  Permission to leave phone message Yes  Some recent data might be hidden     Patient questions:  Do you have a fever, pain , or abdominal swelling? No. Pain Score  0 *  Have you tolerated food without any problems? Yes.    Have you been able to return to your normal activities? Yes.    Do you have any questions about your discharge instructions: Diet   No. Medications  No. Follow up visit  No.  Do you have questions or concerns about your Care? No.  Actions: * If pain score is 4 or above: No action needed, pain <4.

## 2021-07-02 ENCOUNTER — Other Ambulatory Visit: Payer: Self-pay

## 2021-07-02 ENCOUNTER — Encounter: Payer: Self-pay | Admitting: Obstetrics and Gynecology

## 2021-07-02 ENCOUNTER — Ambulatory Visit (INDEPENDENT_AMBULATORY_CARE_PROVIDER_SITE_OTHER): Payer: Medicare Other | Admitting: Obstetrics and Gynecology

## 2021-07-02 VITALS — BP 129/76 | HR 80 | Wt 169.0 lb

## 2021-07-02 DIAGNOSIS — N3941 Urge incontinence: Secondary | ICD-10-CM | POA: Diagnosis not present

## 2021-07-02 DIAGNOSIS — N993 Prolapse of vaginal vault after hysterectomy: Secondary | ICD-10-CM | POA: Diagnosis not present

## 2021-07-02 DIAGNOSIS — N8110 Cystocele, unspecified: Secondary | ICD-10-CM

## 2021-07-02 NOTE — Progress Notes (Signed)
Helen Marshall Return Visit  SUBJECTIVE  History of Present Illness: Helen Marshall is a 75 y.o. female seen in follow-up for to discuss surgery after urodynamic testing.   Urodynamic Impression:  1. Sensation was increased; capacity was normal 2. Stress Incontinence was not demonstrated. 3. Detrusor Overactivity was demonstrated with leakage. 4. Emptying was normal with a normal PVR, a sustained detrusor contraction present,  abdominal straining not present, normal urethral sphincter activity on EMG.  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   Past Surgical History: She  has a past surgical history that includes Cholecystectomy (2001); rotator cuff surgery (1998); Nasal sinus surgery (1997); Tubal ligation (1976); Abdominal hysterectomy (1989); Colonoscopy (06/02/2011); Ankle surgery (Left, 1999); Squamous cell carcinoma excision (Right, 05/2013); Breast surgery (Left, 1970); Upper gi endoscopy (08/22/2014); Hernia repair (2001, 2004); Hernia repair (01/22/2013); Cataract extraction, bilateral (June & July 2017); Laparoscopic hysterectomy; and Gallbladder surgery.   Medications: She has a current medication list which includes the following prescription(s): amlodipine, benazepril, calcium-vitamin d, ezetimibe, fish oil-omega-3 fatty acids, fluticasone, multivitamin, niacin, probiotic product, and triamcinolone cream.   Allergies: Patient is allergic  to crestor [rosuvastatin], demerol  [meperidine hcl], demerol [meperidine], gabapentin, latex, lipitor [atorvastatin], lovastatin, pravastatin, tramadol, vicodin [hydrocodone-acetaminophen], and zocor [simvastatin].   Social History: Patient  reports that she has never smoked. She has never used smokeless tobacco. She reports that she does not drink alcohol and does not use drugs.      OBJECTIVE     Physical Exam: Vitals:   07/02/21 1043  BP: 129/76  Pulse: 80  Weight: 169 lb (76.7 kg)   Gen: No apparent distress, A&O x 3.  Detailed Urogynecologic Evaluation:  Deferred. Prior exam showed:  POP-Q (04/10/21):    POP-Q   2                                            Aa   2                                           Ba   -4                                              C    5                                            Gh   4.5  Pb   7                                            tvl    -2                                            Ap   -2                                            Bp                                                  D      ASSESSMENT AND PLAN    Helen Marshall is a 75 y.o. with:  1. Prolapse of anterior vaginal wall    Plan for surgery: Exam under anesthesia, anterior repair, sacrospinous ligament fixation, cystoscopy  - We reviewed the patient's specific anatomic and functional findings, with the assistance of diagrams, and together finalized the above procedure. The planned surgical procedures were discussed along with the surgical risks outlined below, which were also provided on a detailed handout. Additional treatment options including expectant management, conservative management, medical management were discussed where appropriate.  We reviewed the benefits and risks of each treatment option.  - We discussed that she may have leakage with overactive bladder but the UDS testing did not show leakage with stress.    General Surgical Risks: For all procedures, there are risks of bleeding, infection, damage to surrounding organs including but not limited to bowel, bladder, blood vessels, ureters and nerves, and need for further surgery if an injury were to occur. These risks are all low with minimally invasive surgery.   There are risks of numbness and weakness at any body site or buttock/rectal pain.  It is possible that baseline pain can be worsened by surgery, either with or without mesh. If surgery is vaginal, there is also a low risk of possible conversion to laparoscopy or open abdominal incision where indicated. Very rare risks include blood transfusion, blood clot, heart attack, pneumonia, or death.   There is also a risk of short-term postoperative urinary retention with need to use a catheter. About half of patients need to go home from surgery with a catheter, which is then later removed in the office. The risk of long-term need for a catheter is very low. There is also a risk of worsening of overactive bladder.   Prolapse (with or without mesh): Risk factors for surgical failure  include things that put pressure on your pelvis and the surgical repair, including obesity, chronic cough, and heavy lifting or straining (including lifting children or adults, straining on the toilet, or lifting heavy objects such as furniture or anything weighing >25 lbs. Risks of recurrence is 20-30% with vaginal native tissue repair and a less than 10% with sacrocolpopexy with mesh.    - For preop Visit:  She is required to have a visit within 30 days of her surgery.   Today we  reviewed pre-operative preparation, peri-operative expectations, and post-operative instructions/recovery.  She was provided with instructional handouts. She understands not to take aspirin (>81mg ) or NSAIDs 7 days prior to surgery. Prescriptions will be provided for: Oxycodone 5mg , Ibuprofen 600mg , Tylenol 500mg , Miralax. These prescriptions will  be sent prior to surgery.  - Medical clearance: not required  - Anticoagulant use: No - Medicaid Hysterectomy form: No - Accepts blood transfusion: Yes - Expected length of stay: outpatient  Request sent for surgery scheduling.   Jaquita Folds, MD   Time spent: I spent 20 minutes dedicated to the care of this patient on the date of this encounter to include pre-visit review of records, face-to-face time with the patient discussing surgery and post visit documentation.

## 2021-07-03 ENCOUNTER — Encounter: Payer: Self-pay | Admitting: Internal Medicine

## 2021-07-15 ENCOUNTER — Encounter (HOSPITAL_BASED_OUTPATIENT_CLINIC_OR_DEPARTMENT_OTHER): Payer: Self-pay | Admitting: Obstetrics and Gynecology

## 2021-07-15 ENCOUNTER — Other Ambulatory Visit: Payer: Self-pay

## 2021-07-15 NOTE — Progress Notes (Signed)
Spoke w/ via phone for pre-op interview--- [employer]----       ISTAT and EKG. Awaiting other MD orders, Inbox message sent to Dr. Wannetta Sender 07/15/21.        Lab results------ COVID test -----patient states asymptomatic no test needed Arrive at ------- 7573 NPO after MN NO Solid Food.  Clear liquids from MN until--- 1045 Med rec completed Medications to take morning of surgery -----Norvasc Diabetic medication ----- Patient instructed no nail polish to be worn day of surgery Patient instructed to bring photo id and insurance card day of surgery Patient aware to have Driver (ride ) / caregiver    for 24 hours after surgery  Patient Special Instructions ----- Pre-Op special Istructions ----- Patient verbalized understanding of instructions that were given at this phone interview. Patient denies shortness of breath, chest pain, fever, cough at this phone interview.

## 2021-07-16 NOTE — H&P (Signed)
[facility] Pre-Operative H&P  Subjective Chief Complaint: Helen Marshall presents for a preoperative encounter.   History of Present Illness: Helen Marshall is a 75 y.o. female who presents for preoperative visit.  She is scheduled to undergo Exam under anesthesia, anterior repair, sacrospinous ligament fixation, cystoscopy on 07/20/21.  Her symptoms include vaginal bulge, and she was was found to have Stage III anterior, Stage I posterior, Stage I apical prolapse   Urodynamic Impression:  1. Sensation was increased; capacity was normal 2. Stress Incontinence was not demonstrated. 3. Detrusor Overactivity was demonstrated with leakage. 4. Emptying was normal with a normal PVR, a sustained detrusor contraction present,  abdominal straining not present, normal urethral sphincter activity on EMG.  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   Tubular adenoma of colon    Umbilical hernia 26/3785     Past Surgical History:  Procedure Laterality Date   ABDOMINAL HYSTERECTOMY  1989   fibroids   ANKLE SURGERY Left 1999   BREAST SURGERY Left 1970   biopsy   CATARACT EXTRACTION, BILATERAL  June & July 2017   CHOLECYSTECTOMY  2001   COLONOSCOPY  06/02/2011   Verdie Shire, MD;  diverticulosis, submucosal lipoma of the sigmoid colon.   GALLBLADDER SURGERY     HERNIA REPAIR  2001, 2004   HERNIA REPAIR  01/22/2013   Repeat repair of umbilical defect, 2.5 cm. Primary repair.   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   shoulder   TUBAL LIGATION  1976   UPPER GI ENDOSCOPY  08/22/2014   Dr Billie Lade    is allergic to crestor [rosuvastatin], demerol  [meperidine hcl], demerol [meperidine], gabapentin, latex, lipitor [atorvastatin], lovastatin, pravastatin, tramadol, vicodin [hydrocodone-acetaminophen], and zocor [simvastatin].   Family History  Problem Relation Age of Onset   CVA Father    Alcohol abuse Father    Heart disease Mother        myocardial infarction   Diabetes Mother    Hearing loss Mother    Hypertension Brother        x2   Arthritis Brother    Hearing loss Brother    Alcohol abuse Brother    Arthritis Brother    Hearing loss Brother    Arthritis/Rheumatoid Sister        x2   Breast cancer Sister 14   Arthritis Sister    Hearing loss Sister    Arthritis Sister    Hearing loss Sister    Asthma Sister    Arthritis Sister  Hearing loss Sister    Hypothyroidism Sister    Arthritis Sister    Hearing loss Sister    Cancer Other        breast - paternal first cousin   Colon cancer Maternal Uncle    Colon polyps Son    Colon polyps Sister    Arthritis Sister    Hearing loss Sister     Social History   Tobacco Use   Smoking status: Never   Smokeless tobacco: Never  Vaping Use   Vaping Use: Never used  Substance Use Topics   Alcohol use: No    Alcohol/week: 0.0 standard drinks   Drug use: No     Review of Systems was negative for a full 10 system review except as noted in the History of Present Illness.  No current facility-administered medications for this encounter.  Current Outpatient Medications:    amLODipine (NORVASC) 5  MG tablet, Take 1 tablet (5 mg total) by mouth daily., Disp: 90 tablet, Rfl: 3   benazepril (LOTENSIN) 40 MG tablet, Take 1 tablet (40 mg total) by mouth daily., Disp: 90 tablet, Rfl: 3   calcium-vitamin D (OSCAL-500) 500-400 MG-UNIT tablet, Take 1 tablet by mouth daily. , Disp: , Rfl:    ezetimibe (ZETIA) 10 MG tablet, Take 1 tablet (10 mg total) by mouth daily., Disp: 90 tablet, Rfl: 1   fish oil-omega-3 fatty acids 1000 MG capsule, 5 (five) times daily. , Disp: , Rfl:    fluticasone (FLONASE) 50 MCG/ACT nasal spray, Place 2 sprays into both nostrils as needed., Disp: 48 g, Rfl: 3   Multiple Vitamin (MULTIVITAMIN) tablet, Take 1 tablet by mouth daily., Disp: , Rfl:    niacin (NIASPAN) 1000 MG CR tablet, Take 1,000 mg by mouth 2 (two) times daily., Disp: , Rfl:    Probiotic Product (VSL#3 PO), Take by mouth. 1 capsul daily, Disp: , Rfl:    triamcinolone cream (KENALOG) 0.1 %, Apply 1 application topically as needed. , Disp: , Rfl:    Objective   Previous Pelvic Exam showed: POP-Q (04/10/21):    POP-Q   2                                            Aa   2                                           Ba   -4                                              C    5                                            Gh   4.5                                            Pb   7  tvl    -2                                            Ap   -2                                            Bp                                                  D        Assessment/ Plan  The patient is a 75 y.o. year old with Stage III POP scheduled to undergo Exam under anesthesia, anterior repair, sacrospinous ligament fixation, cystoscopy.   Jaquita Folds, MD

## 2021-07-17 ENCOUNTER — Other Ambulatory Visit: Payer: Self-pay | Admitting: Obstetrics and Gynecology

## 2021-07-17 DIAGNOSIS — N8110 Cystocele, unspecified: Secondary | ICD-10-CM

## 2021-07-17 DIAGNOSIS — N812 Incomplete uterovaginal prolapse: Secondary | ICD-10-CM

## 2021-07-17 DIAGNOSIS — N993 Prolapse of vaginal vault after hysterectomy: Secondary | ICD-10-CM

## 2021-07-17 MED ORDER — ACETAMINOPHEN 500 MG PO TABS: 500.0000 mg | ORAL_TABLET | Freq: Four times a day (QID) | ORAL | 0 refills | Status: DC | PRN

## 2021-07-17 MED ORDER — OXYCODONE HCL 5 MG PO TABS: 5.0000 mg | ORAL_TABLET | ORAL | 0 refills | Status: DC | PRN

## 2021-07-17 MED ORDER — IBUPROFEN 600 MG PO TABS: 600.0000 mg | ORAL_TABLET | Freq: Four times a day (QID) | ORAL | 0 refills | Status: DC | PRN

## 2021-07-17 MED ORDER — POLYETHYLENE GLYCOL 3350 17 GM/SCOOP PO POWD: 17.0000 g | Freq: Every day | ORAL | 0 refills | Status: DC

## 2021-07-17 NOTE — Progress Notes (Signed)
Pt notified of time change for procedure 07/20/21. Pt to arrive 0530, no PO liquids after 0430 Monday morning.

## 2021-07-19 NOTE — Anesthesia Preprocedure Evaluation (Addendum)
Anesthesia Evaluation  Patient identified by MRN, date of birth, ID band Patient awake    Reviewed: Allergy & Precautions, NPO status , Patient's Chart, lab work & pertinent test results  Airway Mallampati: II  TM Distance: >3 FB Neck ROM: Full    Dental  (+) Teeth Intact, Dental Advisory Given, Implants,    Pulmonary neg pulmonary ROS,    Pulmonary exam normal breath sounds clear to auscultation       Cardiovascular hypertension, Pt. on medications + Peripheral Vascular Disease  Normal cardiovascular exam Rhythm:Regular Rate:Normal     Neuro/Psych negative neurological ROS     GI/Hepatic Neg liver ROS, GERD  ,  Endo/Other  Obesity   Renal/GU negative Renal ROS     Musculoskeletal negative musculoskeletal ROS (+)   Abdominal   Peds  Hematology negative hematology ROS (+)   Anesthesia Other Findings   Reproductive/Obstetrics VAGINAL VAULT PROLAPSE AFTER HYSTERECTOMY                            Anesthesia Physical Anesthesia Plan  ASA: 3  Anesthesia Plan: General   Post-op Pain Management: Tylenol PO (pre-op)   Induction: Intravenous  PONV Risk Score and Plan: 4 or greater and Dexamethasone, Ondansetron and Treatment may vary due to age or medical condition  Airway Management Planned: Oral ETT  Additional Equipment:   Intra-op Plan:   Post-operative Plan: Extubation in OR  Informed Consent: I have reviewed the patients History and Physical, chart, labs and discussed the procedure including the risks, benefits and alternatives for the proposed anesthesia with the patient or authorized representative who has indicated his/her understanding and acceptance.     Dental advisory given  Plan Discussed with: CRNA  Anesthesia Plan Comments:        Anesthesia Quick Evaluation

## 2021-07-20 ENCOUNTER — Ambulatory Visit (HOSPITAL_BASED_OUTPATIENT_CLINIC_OR_DEPARTMENT_OTHER)
Admission: RE | Admit: 2021-07-20 | Discharge: 2021-07-20 | Disposition: A | Discharge status: Home / Self Care | Payer: Medicare Other | Attending: Obstetrics and Gynecology | Admitting: Obstetrics and Gynecology

## 2021-07-20 ENCOUNTER — Encounter (HOSPITAL_BASED_OUTPATIENT_CLINIC_OR_DEPARTMENT_OTHER)
Admission: RE | Disposition: A | Discharge status: Home / Self Care | Payer: Self-pay | Source: Home / Self Care | Attending: Obstetrics and Gynecology

## 2021-07-20 ENCOUNTER — Ambulatory Visit (HOSPITAL_BASED_OUTPATIENT_CLINIC_OR_DEPARTMENT_OTHER): Payer: Medicare Other | Admitting: Anesthesiology

## 2021-07-20 ENCOUNTER — Encounter (HOSPITAL_BASED_OUTPATIENT_CLINIC_OR_DEPARTMENT_OTHER): Payer: Self-pay | Admitting: Obstetrics and Gynecology

## 2021-07-20 ENCOUNTER — Other Ambulatory Visit: Payer: Self-pay

## 2021-07-20 DIAGNOSIS — N8111 Cystocele, midline: Secondary | ICD-10-CM | POA: Diagnosis not present

## 2021-07-20 DIAGNOSIS — Z79899 Other long term (current) drug therapy: Secondary | ICD-10-CM | POA: Insufficient documentation

## 2021-07-20 DIAGNOSIS — Z90710 Acquired absence of both cervix and uterus: Secondary | ICD-10-CM | POA: Insufficient documentation

## 2021-07-20 DIAGNOSIS — Z6831 Body mass index (BMI) 31.0-31.9, adult: Secondary | ICD-10-CM | POA: Diagnosis not present

## 2021-07-20 DIAGNOSIS — K219 Gastro-esophageal reflux disease without esophagitis: Secondary | ICD-10-CM | POA: Diagnosis not present

## 2021-07-20 DIAGNOSIS — E669 Obesity, unspecified: Secondary | ICD-10-CM | POA: Diagnosis not present

## 2021-07-20 DIAGNOSIS — I739 Peripheral vascular disease, unspecified: Secondary | ICD-10-CM | POA: Diagnosis not present

## 2021-07-20 DIAGNOSIS — N993 Prolapse of vaginal vault after hysterectomy: Secondary | ICD-10-CM | POA: Diagnosis not present

## 2021-07-20 DIAGNOSIS — I10 Essential (primary) hypertension: Secondary | ICD-10-CM | POA: Diagnosis not present

## 2021-07-20 HISTORY — PX: ANTERIOR AND POSTERIOR REPAIR WITH SACROSPINOUS FIXATION: SHX6536

## 2021-07-20 HISTORY — PX: CYSTOSCOPY: SHX5120

## 2021-07-20 LAB — POCT I-STAT, CHEM 8
BUN: 14 mg/dL (ref 8–23)
Calcium, Ion: 1.26 mmol/L (ref 1.15–1.40)
Chloride: 105 mmol/L (ref 98–111)
Creatinine, Ser: 0.6 mg/dL (ref 0.44–1.00)
Glucose, Bld: 108 mg/dL — ABNORMAL HIGH (ref 70–99)
HCT: 41 % (ref 36.0–46.0)
Hemoglobin: 13.9 g/dL (ref 12.0–15.0)
Potassium: 3.4 mmol/L — ABNORMAL LOW (ref 3.5–5.1)
Sodium: 143 mmol/L (ref 135–145)
TCO2: 24 mmol/L (ref 22–32)

## 2021-07-20 SURGERY — ANTERIOR AND POSTERIOR REPAIR WITH SACROSPINOUS FIXATION
Anesthesia: General

## 2021-07-20 MED ORDER — FENTANYL CITRATE (PF) 100 MCG/2ML IJ SOLN
INTRAMUSCULAR | Status: DC | PRN
  Administered 2021-07-20 (×3): 50 ug via INTRAVENOUS

## 2021-07-20 MED ORDER — ONDANSETRON HCL 4 MG/2ML IJ SOLN
INTRAMUSCULAR | Status: DC | PRN
  Administered 2021-07-20: 4 mg via INTRAVENOUS

## 2021-07-20 MED ORDER — PHENAZOPYRIDINE HCL 100 MG PO TABS
ORAL_TABLET | ORAL | Status: AC
  Filled 2021-07-20: qty 2

## 2021-07-20 MED ORDER — PROPOFOL 10 MG/ML IV BOLUS
INTRAVENOUS | Status: AC
  Filled 2021-07-20: qty 20

## 2021-07-20 MED ORDER — MIDAZOLAM HCL 2 MG/2ML IJ SOLN
INTRAMUSCULAR | Status: AC
  Filled 2021-07-20: qty 2

## 2021-07-20 MED ORDER — ONDANSETRON HCL 4 MG/2ML IJ SOLN
INTRAMUSCULAR | Status: AC
  Filled 2021-07-20: qty 2

## 2021-07-20 MED ORDER — EPHEDRINE SULFATE-NACL 50-0.9 MG/10ML-% IV SOSY
PREFILLED_SYRINGE | INTRAVENOUS | Status: DC | PRN
  Administered 2021-07-20: 5 mg via INTRAVENOUS
  Administered 2021-07-20: 10 mg via INTRAVENOUS

## 2021-07-20 MED ORDER — ROCURONIUM BROMIDE 10 MG/ML (PF) SYRINGE
PREFILLED_SYRINGE | INTRAVENOUS | Status: AC
  Filled 2021-07-20: qty 40

## 2021-07-20 MED ORDER — SODIUM CHLORIDE 0.9 % IR SOLN
Status: DC | PRN
  Administered 2021-07-20: 1000 mL

## 2021-07-20 MED ORDER — LIDOCAINE 2% (20 MG/ML) 5 ML SYRINGE
INTRAMUSCULAR | Status: AC
  Filled 2021-07-20: qty 5

## 2021-07-20 MED ORDER — FENTANYL CITRATE (PF) 250 MCG/5ML IJ SOLN
INTRAMUSCULAR | Status: AC
  Filled 2021-07-20: qty 5

## 2021-07-20 MED ORDER — LIDOCAINE 2% (20 MG/ML) 5 ML SYRINGE
INTRAMUSCULAR | Status: DC | PRN
  Administered 2021-07-20: 80 mg via INTRAVENOUS

## 2021-07-20 MED ORDER — PHENYLEPHRINE 40 MCG/ML (10ML) SYRINGE FOR IV PUSH (FOR BLOOD PRESSURE SUPPORT)
PREFILLED_SYRINGE | INTRAVENOUS | Status: AC
  Filled 2021-07-20: qty 10

## 2021-07-20 MED ORDER — ACETAMINOPHEN 500 MG PO TABS
ORAL_TABLET | ORAL | Status: AC
  Filled 2021-07-20: qty 2

## 2021-07-20 MED ORDER — CEFAZOLIN SODIUM-DEXTROSE 2-4 GM/100ML-% IV SOLN
INTRAVENOUS | Status: AC
  Filled 2021-07-20: qty 100

## 2021-07-20 MED ORDER — PROPOFOL 10 MG/ML IV BOLUS
INTRAVENOUS | Status: DC | PRN
  Administered 2021-07-20: 130 mg via INTRAVENOUS

## 2021-07-20 MED ORDER — DEXAMETHASONE SODIUM PHOSPHATE 10 MG/ML IJ SOLN
INTRAMUSCULAR | Status: AC
  Filled 2021-07-20: qty 1

## 2021-07-20 MED ORDER — ACETAMINOPHEN 500 MG PO TABS
1000.0000 mg | ORAL_TABLET | Freq: Once | ORAL | Status: AC
  Administered 2021-07-20: 06:00:00 1000 mg via ORAL

## 2021-07-20 MED ORDER — LACTATED RINGERS IV SOLN: INTRAVENOUS | Status: DC

## 2021-07-20 MED ORDER — 0.9 % SODIUM CHLORIDE (POUR BTL) OPTIME
TOPICAL | Status: DC | PRN
  Administered 2021-07-20: 08:00:00 500 mL

## 2021-07-20 MED ORDER — WHITE PETROLATUM EX OINT
TOPICAL_OINTMENT | CUTANEOUS | Status: AC
  Filled 2021-07-20: qty 5

## 2021-07-20 MED ORDER — ONDANSETRON HCL 4 MG/2ML IJ SOLN: 4.0000 mg | Freq: Once | INTRAMUSCULAR | Status: DC | PRN

## 2021-07-20 MED ORDER — PHENYLEPHRINE 40 MCG/ML (10ML) SYRINGE FOR IV PUSH (FOR BLOOD PRESSURE SUPPORT)
PREFILLED_SYRINGE | INTRAVENOUS | Status: DC | PRN
  Administered 2021-07-20: 80 ug via INTRAVENOUS

## 2021-07-20 MED ORDER — DEXAMETHASONE SODIUM PHOSPHATE 10 MG/ML IJ SOLN
INTRAMUSCULAR | Status: DC | PRN
  Administered 2021-07-20: 10 mg via INTRAVENOUS

## 2021-07-20 MED ORDER — EPHEDRINE 5 MG/ML INJ
INTRAVENOUS | Status: AC
  Filled 2021-07-20: qty 5

## 2021-07-20 MED ORDER — CEFAZOLIN SODIUM-DEXTROSE 2-4 GM/100ML-% IV SOLN
2.0000 g | INTRAVENOUS | Status: AC
  Administered 2021-07-20: 07:00:00 2 g via INTRAVENOUS

## 2021-07-20 MED ORDER — LIDOCAINE-EPINEPHRINE 1 %-1:100000 IJ SOLN
INTRAMUSCULAR | Status: DC | PRN
  Administered 2021-07-20: 15 mL

## 2021-07-20 MED ORDER — FENTANYL CITRATE (PF) 100 MCG/2ML IJ SOLN: 25.0000 ug | INTRAMUSCULAR | Status: DC | PRN

## 2021-07-20 MED ORDER — PHENAZOPYRIDINE HCL 100 MG PO TABS
200.0000 mg | ORAL_TABLET | ORAL | Status: AC
  Administered 2021-07-20: 06:00:00 200 mg via ORAL

## 2021-07-20 SURGICAL SUPPLY — 37 items
AGENT HMST KT MTR STRL THRMB (HEMOSTASIS)
BLADE SURG 15 STRL LF DISP TIS (BLADE) ×1 IMPLANT
BLADE SURG 15 STRL SS (BLADE) ×3
DEVICE CAPIO SLIM SINGLE (INSTRUMENTS) ×2 IMPLANT
ELECT REM PT RETURN 9FT ADLT (ELECTROSURGICAL) ×3
ELECTRODE REM PT RTRN 9FT ADLT (ELECTROSURGICAL) IMPLANT
GAUZE 4X4 16PLY ~~LOC~~+RFID DBL (SPONGE) ×3 IMPLANT
GLOVE SURG POLYISO LF SZ6 (GLOVE) ×2 IMPLANT
GLOVE SURG POLYISO LF SZ6.5 (GLOVE) ×2 IMPLANT
GLOVE SURG UNDER POLY LF SZ6.5 (GLOVE) ×3 IMPLANT
GLOVE SURG UNDER POLY LF SZ7 (GLOVE) ×4 IMPLANT
GOWN STRL REUS W/TWL LRG LVL3 (GOWN DISPOSABLE) ×7 IMPLANT
HIBICLENS CHG 4% 4OZ BTL (MISCELLANEOUS) ×3 IMPLANT
HOLDER FOLEY CATH W/STRAP (MISCELLANEOUS) ×3 IMPLANT
IV NS 1000ML (IV SOLUTION) ×3
IV NS 1000ML BAXH (IV SOLUTION) IMPLANT
KIT TURNOVER CYSTO (KITS) ×3 IMPLANT
MANIFOLD NEPTUNE II (INSTRUMENTS) ×3 IMPLANT
NDL MAYO 6 CRC TAPER PT (NEEDLE) IMPLANT
NEEDLE HYPO 22GX1.5 SAFETY (NEEDLE) ×3 IMPLANT
NEEDLE MAYO 6 CRC TAPER PT (NEEDLE) ×3 IMPLANT
NS IRRIG 500ML POUR BTL (IV SOLUTION) ×2 IMPLANT
PACK CYSTO (CUSTOM PROCEDURE TRAY) ×3 IMPLANT
PACK VAGINAL WOMENS (CUSTOM PROCEDURE TRAY) ×3 IMPLANT
RETRACTOR LONE STAR DISPOSABLE (INSTRUMENTS) ×3 IMPLANT
RETRACTOR STAY HOOK 5MM (MISCELLANEOUS) ×3 IMPLANT
SET IRRIG Y TYPE TUR BLADDER L (SET/KITS/TRAYS/PACK) ×2 IMPLANT
SURGIFLO W/THROMBIN 8M KIT (HEMOSTASIS) IMPLANT
SUT ABS MONO DBL WITH NDL 48IN (SUTURE) ×4 IMPLANT
SUT VIC AB 0 CT1 27 (SUTURE)
SUT VIC AB 0 CT1 27XBRD ANTBC (SUTURE) IMPLANT
SUT VIC AB 2-0 SH 27 (SUTURE)
SUT VIC AB 2-0 SH 27XBRD (SUTURE) IMPLANT
SUT VICRYL 2-0 SH 8X27 (SUTURE) ×3 IMPLANT
SYR BULB EAR ULCER 3OZ GRN STR (SYRINGE) ×3 IMPLANT
TOWEL OR 17X26 10 PK STRL BLUE (TOWEL DISPOSABLE) ×3 IMPLANT
TRAY FOLEY W/BAG SLVR 14FR LF (SET/KITS/TRAYS/PACK) ×3 IMPLANT

## 2021-07-20 NOTE — Discharge Instructions (Addendum)

## 2021-07-20 NOTE — Anesthesia Postprocedure Evaluation (Signed)
Anesthesia Post Note  Patient: Helen Marshall  Procedure(s) Performed: ANTERIOR AND REPAIR WITH SACROSPINOUS FIXATION CYSTOSCOPY EXAM UNDER ANESTHESIA     Patient location during evaluation: PACU Anesthesia Type: General Level of consciousness: awake and alert Pain management: pain level controlled Vital Signs Assessment: post-procedure vital signs reviewed and stable Respiratory status: spontaneous breathing, nonlabored ventilation and respiratory function stable Cardiovascular status: blood pressure returned to baseline and stable Postop Assessment: no apparent nausea or vomiting Anesthetic complications: no   No notable events documented.  Last Vitals:  Vitals:   07/20/21 0917 07/20/21 1008  BP:  136/65  Pulse: 85 79  Resp: 11 15  Temp:  36.5 C  SpO2: 96% 95%    Last Pain:  Vitals:   07/20/21 1008  TempSrc:   PainSc: 0-No pain                 Catalina Gravel

## 2021-07-20 NOTE — Transfer of Care (Signed)
Immediate Anesthesia Transfer of Care Note  Patient: Helen Marshall  Procedure(s) Performed: Procedure(s) (LRB): ANTERIOR AND REPAIR WITH SACROSPINOUS FIXATION (N/A) CYSTOSCOPY (N/A) EXAM UNDER ANESTHESIA (N/A)  Patient Location: PACU  Anesthesia Type: General  Level of Consciousness: awake, sedated, patient cooperative and responds to stimulation  Airway & Oxygen Therapy: Patient Spontanous Breathing and Patient connected to [place] 02 and soft FM   Post-op Assessment: Report given to PACU RN, Post -op Vital signs reviewed and stable and Patient moving all extremities  Post vital signs: Reviewed and stable  Complications: No apparent anesthesia complications

## 2021-07-20 NOTE — Interval H&P Note (Signed)
History and Physical Interval Note:  07/20/2021 7:14 AM  Helen Marshall  has presented today for surgery, with the diagnosis of VAGINAL VAULT PROLAPSE [place].  The various methods of treatment have been discussed with the patient and family. After consideration of risks, benefits and other options for treatment, the patient has consented to  Procedure(s): ANTERIOR AND REPAIR WITH SACROSPINOUS FIXATION (N/A) CYSTOSCOPY (N/A) as a surgical intervention.  The patient's history has been reviewed, patient examined, no change in status, stable for surgery.  I have reviewed the patient's chart and labs.  Questions were answered to the patient's satisfaction.     Jaquita Folds

## 2021-07-20 NOTE — Anesthesia Procedure Notes (Signed)
Procedure Name: LMA Insertion Date/Time: 07/20/2021 7:32 AM Performed by: Rogers Blocker, CRNA Pre-anesthesia Checklist: Patient identified, Emergency Drugs available, Suction available and Patient being monitored Patient Re-evaluated:Patient Re-evaluated prior to induction Oxygen Delivery Method: Circle System Utilized Preoxygenation: Pre-oxygenation with 100% oxygen Induction Type: IV induction Ventilation: Mask ventilation without difficulty LMA: LMA inserted LMA Size: 4.0 Number of attempts: 1 Airway Equipment and Method: Bite block Placement Confirmation: positive ETCO2 Tube secured with: Tape Dental Injury: Teeth and Oropharynx as per pre-operative assessment

## 2021-07-20 NOTE — Op Note (Signed)
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 Stage II anterior vaginal prolapse noted  2. On cystoscopy, normal bladder and urethra without injury, lesion or foreign body. Brisk bilateral ureteral efflux noted.     Specimens: none  Estimated blood loss: 50 mL  IV fluids: 600 mL  Urine output: 481 mL  Complications: none  Procedure in Detail:  After informed consent was obtained, the patient was taken to the operating room where anesthesia was induced and found to be adequate. She was placed in dorsal lithotomy position, taking care to avoid any traction on the extremities, and then prepped and draped in the usual sterile fashion. A self-retaining lonestar retractor was placed using four elastic blue stays.  After a foley catheter was inserted into the urethra, the location of the midurethra was palpated. Two Allis clamps were along the anterior vaginal wall defect. 1% lidocaine with epinephrine was injected into the vaginal mucosa.  A vertical incision was made between these two Allis clamps with a 15 blade scalpel.  Allis clamps were placed along this incision and Metzenbaum scissors were used to undermine the vaginal mucosa along the incision.  The vaginal mucosa was then sharply dissected off to the vesicovaginal septum bilaterally to the level of the pubic rami.    For the sacrospinous ligament fixation (SSLF), the ischial spine was accessed on the right side via dissection with Mayo scissors and blunt dissection.  The sacrospinous ligament was palpated. Two 0 PDS suture was then placed at the sacrospinous ligament two fingerbreadths medial to the ischial spine, in order to avoid  the pudendal neurovascular bundle, using a Capio needle driver.  The PDS suture was attached to the vaginal epithelium on the ipsilateral side of the vaginal apex and held. Anterior plication of the vesicovaginal septum was then performed using plicating sutures of 2-0 Vicryl. The vaginal mucosal edges were trimmed and the incision reapproximated with 2-0 Vicryl in a running fashion. The SSLF suture was then tied down with excellent support of the anterior and apical vagina.    The Foley catheter was removed.  A 70-degree cystoscope was introduced, and 360-degree inspection revealed no trauma to the bladder, with bilateral ureteral efflux.  The bladder was drained and the cystoscope was removed.  The Foley catheter was reinserted.  The vagina was copiously irrigated.  Hemostasis was noted.    The patient tolerated the procedure well.  She was awakened from anesthesia and transferred to the recovery room in stable condition. All counts were correct x 2.    Helen Folds, MD

## 2021-07-21 ENCOUNTER — Encounter (HOSPITAL_BASED_OUTPATIENT_CLINIC_OR_DEPARTMENT_OTHER): Payer: Self-pay | Admitting: Obstetrics and Gynecology

## 2021-07-21 ENCOUNTER — Telehealth: Payer: Self-pay | Admitting: Obstetrics and Gynecology

## 2021-07-21 NOTE — Telephone Encounter (Signed)
Helen Marshall underwent anterior repair, sacrospinous ligament fixation and cystoscopy on 07/20/21.   She passed her voiding trial.  375ml was backfilled into the bladder Voided 332ml  PVR by bladder scan was 49ml.   She was discharged without a catheter. Please call her for a routine post op check. Thanks!  Jaquita Folds, MD

## 2021-07-22 NOTE — Telephone Encounter (Signed)
Post- Op Call  Helen Marshall underwent anterior repair , sacrospinous ligament fixation, cystoscopy on 07/20/21 with Dr Wannetta Sender. The patient reports that her pain is controlled. She is taking ibuprofen. She reports vaginal bleeding described as spotting. She has not had a bowel movement and is taking miralax and docusate for a bowel regimen. She was discharged without a catheter. Advised to call with any problems or concerns.

## 2021-07-30 ENCOUNTER — Encounter: Payer: Self-pay | Admitting: *Deleted

## 2021-08-17 ENCOUNTER — Ambulatory Visit (INDEPENDENT_AMBULATORY_CARE_PROVIDER_SITE_OTHER): Payer: Medicare Other | Admitting: Physician Assistant

## 2021-08-17 ENCOUNTER — Encounter: Payer: Self-pay | Admitting: Physician Assistant

## 2021-08-17 DIAGNOSIS — U071 COVID-19: Secondary | ICD-10-CM | POA: Diagnosis not present

## 2021-08-17 MED ORDER — MOLNUPIRAVIR EUA 200MG CAPSULE: 4.0000 | ORAL_CAPSULE | Freq: Two times a day (BID) | ORAL | 0 refills | Status: AC

## 2021-08-17 NOTE — Progress Notes (Signed)
TELEPHONE ENCOUNTER   Patient verbally agreed to telephone visit and is aware that copayment and coinsurance may apply. Patient was treated using telemedicine according to accepted telemedicine protocols.  Location of the patient: home Location of provider: [facility] Names of all persons participating in the telemedicine service and role in the encounter: Inda Coke, PA , [name]  Subjective:   Chief Complaint  Patient presents with   Covid Positive    Tested positive on yesterday   Cough    Symptoms started yesterday   Nasal Congestion    Mucus is yellow Has taking Sinus Medication and Mucinex    Generalized Body Aches     HPI   COVID-19 Has had all COVID-19 vaccines and boosters.  Patient developed symptoms of COVID including cough, nasal congestion, chills, body aches yesterday.  She took a COVID test yesterday at home and this was positive.  Her husband is currently getting over COVID, he is taking molnupiravir.  She denies any current or former use of inhalers regularly for underlying respiratory issues.  She denies: Fever, chest pain, shortness of breath, diarrhea, inability to take p.o.'s, weakness.  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   GERD (gastroesophageal reflux disease) 07/22/2012   Diverticulosis 07/21/2012   Hypertension 07/21/2012   Hypercholesterolemia 07/21/2012   Abnormal liver function test 07/21/2012   Left carotid bruit 07/21/2012   Hyperglycemia  07/21/2012   Environmental allergies 07/21/2012   Lumbar disc disease 07/21/2012   Social History   Tobacco Use   Smoking status: Never   Smokeless tobacco: Never  Substance Use Topics   Alcohol use: No    Alcohol/week: 0.0 standard drinks    Current Outpatient Medications:    acetaminophen (TYLENOL) 500 MG tablet, Take 1 tablet (500 mg total) by mouth every 6 (six) hours as needed (pain)., Disp: 30 tablet, Rfl: 0   amLODipine (NORVASC) 5 MG tablet, Take 1 tablet (5 mg total) by mouth daily., Disp: 90 tablet, Rfl: 3   benazepril (LOTENSIN) 40 MG tablet, Take 1 tablet (40 mg total) by mouth daily., Disp: 90 tablet, Rfl: 3   calcium-vitamin D (OSCAL-500) 500-400 MG-UNIT tablet, Take 1 tablet by mouth daily. , Disp: , Rfl:    ezetimibe (ZETIA) 10 MG tablet, Take 1 tablet (10 mg total) by mouth daily., Disp: 90 tablet, Rfl: 1   fish oil-omega-3 fatty acids 1000 MG capsule, 5 (five) times daily. , Disp: , Rfl:    fluticasone (FLONASE) 50 MCG/ACT nasal spray, Place 2 sprays into both nostrils as needed., Disp: 48 g, Rfl: 3   ibuprofen (ADVIL) 600 MG tablet, Take 1 tablet (600 mg total) by mouth every 6 (six) hours as needed., Disp: 30 tablet, Rfl: 0   molnupiravir EUA (LAGEVRIO) 200 mg CAPS capsule, Take 4 capsules (800 mg total) by mouth 2 (two) times daily for 5 days., Disp: 40 capsule, Rfl: 0   Multiple Vitamin (MULTIVITAMIN) tablet, Take 1 tablet by mouth daily., Disp: , Rfl:    niacin (  NIASPAN) 1000 MG CR tablet, Take 1,000 mg by mouth 2 (two) times daily., Disp: , Rfl:    oxyCODONE (OXY IR/ROXICODONE) 5 MG immediate release tablet, Take 1 tablet (5 mg total) by mouth every 4 (four) hours as needed for severe pain., Disp: 5 tablet, Rfl: 0   polyethylene glycol powder (GLYCOLAX/MIRALAX) 17 GM/SCOOP powder, Take 17 g by mouth daily. Drink 17g (1 scoop) dissolved in water per day., Disp: 255 g, Rfl: 0   Probiotic Product (VSL#3 PO), Take by mouth. 1 capsul daily, Disp: , Rfl:     triamcinolone cream (KENALOG) 0.1 %, Apply 1 application topically as needed. , Disp: , Rfl:  Allergies  Allergen Reactions   Crestor [Rosuvastatin] Other (See Comments)    Leg cramping    Demerol  [Meperidine Hcl]    Demerol [Meperidine] Nausea And Vomiting   Gabapentin    Latex Other (See Comments)    Redness, hands breakout   Lipitor [Atorvastatin] Other (See Comments)    Intolerant    Lovastatin Other (See Comments)    Elevated CK   Pravastatin Other (See Comments)    intolerant   Tramadol Nausea Only   Vicodin [Hydrocodone-Acetaminophen] Other (See Comments)    itching   Zocor [Simvastatin] Other (See Comments)    Intolerant     Assessment & Plan:   1. COVID-19   No red flags on discussion. Will initiate molnupiravir per orders. Discussed taking medications as prescribed. Reviewed return precautions including worsening fever, SOB, worsening cough or other concerns. Push fluids and rest. I recommend that patient follow-up if symptoms worsen or persist despite treatment x 7-10 days, sooner if needed.   No orders of the defined types were placed in this encounter.  Meds ordered this encounter  Medications   molnupiravir EUA (LAGEVRIO) 200 mg CAPS capsule    Sig: Take 4 capsules (800 mg total) by mouth 2 (two) times daily for 5 days.    Dispense:  40 capsule    Refill:  0    Order Specific Question:   Supervising Provider    Answer:   Maryruth Eve    Inda Coke, PA 08/17/2021  Time spent with the patient: 8 minutes, spent in obtaining information about her symptoms, reviewing her previous labs, evaluations, and treatments, counseling her about her condition (please see the discussed topics above), and developing a plan to further investigate it; she had a number of questions which I addressed.

## 2021-09-02 ENCOUNTER — Encounter: Payer: Self-pay | Admitting: Obstetrics and Gynecology

## 2021-09-02 ENCOUNTER — Ambulatory Visit (INDEPENDENT_AMBULATORY_CARE_PROVIDER_SITE_OTHER): Payer: Medicare Other | Admitting: Obstetrics and Gynecology

## 2021-09-02 ENCOUNTER — Other Ambulatory Visit: Payer: Self-pay

## 2021-09-02 VITALS — BP 145/79 | HR 90

## 2021-09-02 DIAGNOSIS — Z9889 Other specified postprocedural states: Secondary | ICD-10-CM

## 2021-09-02 NOTE — Progress Notes (Signed)
[facility]  Date of Visit: 09/02/2021  History of Present Illness: [name] is a 76 y.o. female scheduled today for a post-operative visit.   Surgery: s/p anterior repair , sacrospinous ligament fixation, cystoscopy on 07/20/21  She passed her postoperative void trial.   Postoperative course has been uncomplicated.   Today she reports that she has had no problems and is happy with the outcome of the surgery.   UTI in the last 6 weeks? No  Pain? No  She has returned to her normal activity (except for postop restrictions) Vaginal bulge? No  Stress incontinence: No  Urgency/frequency: No  Urge incontinence: Yes - occasionally waking up in the morning, not bothersome Voiding dysfunction: No  Bowel issues: No   Subjective Success: Do you usually have a bulge or something falling out that you can see or feel in the vaginal area? No  Retreatment Success: Any retreatment with surgery or pessary for any compartment? No    Medications: She has a current medication list which includes the following prescription(s): amlodipine, benazepril, calcium-vitamin d, ezetimibe, fish oil-omega-3 fatty acids, fluticasone, multivitamin, niacin, probiotic product, triamcinolone cream, and ibuprofen.   Allergies: Patient is allergic to crestor [rosuvastatin], demerol  [meperidine hcl], demerol [meperidine], gabapentin, latex, lipitor [atorvastatin], lovastatin, pravastatin, tramadol, vicodin [hydrocodone-acetaminophen], and zocor [simvastatin].   Physical Exam: BP (!) 145/79    Pulse 90    LMP 07/21/1988    Pelvic Examination: Vagina: Incisions well  healed. Sutures are present at the apex and there is not granulation tissue. No tenderness along the anterior or posterior vagina. No apical tenderness. No pelvic masses.   POP-Q: POP-Q  -2.5                                            Aa   -2.5                                           Ba  -8.5                                               C   4.5                                            Gh  4                                            Pb  9                                            tvl   -1.5  Ap  -1.5                                            Bp                                                 D    ---------------------------------------------------------  Assessment and Plan:  1. Post-operative state    - Well healed.  - She has access to her operative note through [employer].  - Can resume regular activity including exercise and intercourse,  if desired.  - Discussed avoidance of heavy lifting and straining long term to reduce the risk of recurrence.  - Does not want to start any treatment for occasional urge incontinence at this time.   Follow up as needed  Jaquita Folds, MD

## 2021-09-14 ENCOUNTER — Ambulatory Visit (INDEPENDENT_AMBULATORY_CARE_PROVIDER_SITE_OTHER): Payer: Medicare Other | Admitting: Internal Medicine

## 2021-09-14 ENCOUNTER — Other Ambulatory Visit: Payer: Self-pay

## 2021-09-14 ENCOUNTER — Encounter: Payer: Self-pay | Admitting: Internal Medicine

## 2021-09-14 VITALS — BP 128/74 | HR 90 | Temp 97.6°F | Resp 16 | Ht 63.0 in | Wt 173.4 lb

## 2021-09-14 DIAGNOSIS — Z0000 Encounter for general adult medical examination without abnormal findings: Secondary | ICD-10-CM

## 2021-09-14 DIAGNOSIS — R918 Other nonspecific abnormal finding of lung field: Secondary | ICD-10-CM

## 2021-09-14 DIAGNOSIS — R911 Solitary pulmonary nodule: Secondary | ICD-10-CM

## 2021-09-14 DIAGNOSIS — R7989 Other specified abnormal findings of blood chemistry: Secondary | ICD-10-CM

## 2021-09-14 DIAGNOSIS — M25569 Pain in unspecified knee: Secondary | ICD-10-CM

## 2021-09-14 DIAGNOSIS — N8110 Cystocele, unspecified: Secondary | ICD-10-CM

## 2021-09-14 DIAGNOSIS — I10 Essential (primary) hypertension: Secondary | ICD-10-CM

## 2021-09-14 DIAGNOSIS — E7800 Pure hypercholesterolemia, unspecified: Secondary | ICD-10-CM | POA: Diagnosis not present

## 2021-09-14 DIAGNOSIS — K429 Umbilical hernia without obstruction or gangrene: Secondary | ICD-10-CM

## 2021-09-14 DIAGNOSIS — D649 Anemia, unspecified: Secondary | ICD-10-CM

## 2021-09-14 DIAGNOSIS — R739 Hyperglycemia, unspecified: Secondary | ICD-10-CM

## 2021-09-14 DIAGNOSIS — M79676 Pain in unspecified toe(s): Secondary | ICD-10-CM

## 2021-09-14 MED ORDER — FLUTICASONE PROPIONATE 50 MCG/ACT NA SUSP: 2.0000 | NASAL | 3 refills | Status: DC | PRN

## 2021-09-15 NOTE — Progress Notes (Signed)
Patient ID: [name], female   DOB: 03-01-1946, 76 y.o.   MRN: [id number]   Subjective:    Patient ID: [name], female    DOB: 1946-01-21, 76 y.o.   MRN: [id number]  This visit occurred during the SARS-CoV-2 public health emergency.  Safety protocols were in place, including screening questions prior to the visit, additional usage of staff PPE, and extensive cleaning of exam room while observing appropriate contact time as indicated for disinfecting solutions.   Patient here for her physical exam.   Chief Complaint  Patient presents with   Annual Exam   .   HPI She is s/p anterior repair, sacrospinous ligament fixation and cystoscopy 07/20/21.  Has been followed by urogyn.  Doing well post op.  Had covid 08/16/21.  Treated with molnupiravir.  Doing well.  No residual problems.  Having issues with her knees - bilateral knee pain.  Hard to get up.  Requests referral to ortho - Dr Sharol Given.  No chest pain or sob reported.  No increased cough or congestion.  Handling stress.  No abdominal pain reported.  Bowels stable.  Mammogram scheduled for next week and then has f/u with Dr Bary Castilla.  Request referral to podiatry - for great toe - nail - pain.  Seeing endocrinology - receiving reclast.  Was to have f/u bone density.  Thinks f/u is the end of month.  Discussed incidental finding - right lower lobe nodule (11m) - CT abdomen.     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   Tubular adenoma of colon    Umbilical hernia 176/1607  Past Surgical History:  Procedure Laterality Date   ABDOMINAL HYSTERECTOMY  1989   fibroids   ANKLE SURGERY Left 1999   ANTERIOR AND POSTERIOR REPAIR WITH SACROSPINOUS FIXATION N/A 07/20/2021   Procedure: ANTERIOR AND REPAIR WITH SACROSPINOUS FIXATION;  Surgeon: SJaquita Folds MD;  [facility]  Service: Gynecology;  Laterality: N/A;   BREAST SURGERY Left 1970   biopsy   CATARACT EXTRACTION, BILATERAL  June & July 2017   CHOLECYSTECTOMY  2001   COLONOSCOPY  06/02/2011   PVerdie Shire MD; diverticulosis, submucosal lipoma of the sigmoid colon.   CYSTOSCOPY N/A 07/20/2021   Procedure: CYSTOSCOPY;  Surgeon: SJaquita Folds MD;  [facility]  Service: Gynecology;  Laterality: N/A;   GALLBLADDER SURGERY     HERNIA REPAIR  2001, 2004   HERNIA REPAIR  01/22/2013   Repeat repair of umbilical defect, 2.5 cm. Primary repair.   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   shoulder   TUBAL LIGATION  1976   UPPER GI ENDOSCOPY  08/22/2014   Dr Billie Lade   Family History  Problem Relation Age of Onset   CVA Father    Alcohol abuse Father    Heart disease Mother        myocardial infarction   Diabetes Mother    Hearing loss Mother    Hypertension Brother        x2   Arthritis Brother    Hearing loss Brother    Alcohol abuse Brother    Arthritis Brother    Hearing loss Brother    Arthritis/Rheumatoid Sister        x2   Breast cancer Sister 82   Arthritis Sister    Hearing loss Sister    Arthritis Sister    Hearing loss Sister    Asthma Sister    Arthritis Sister    Hearing loss Sister     Hypothyroidism Sister    Arthritis Sister    Hearing loss Sister    Cancer Other        breast - paternal first cousin   Colon cancer Maternal Uncle    Colon polyps Son    Colon polyps Sister    Arthritis Sister    Hearing loss Sister    Social History   Socioeconomic History   Marital status: Married    Spouse name: Not on file   Number of children: 2   Years of education: Not on file   Highest education level: Not on file  Occupational History   Occupation: Retired  Tobacco Use   Smoking status: Never   Smokeless tobacco: Never  Vaping Use   Vaping Use: Never used  Substance and Sexual Activity   Alcohol use: No    Alcohol/week: 0.0 standard drinks   Drug use: No   Sexual activity: Not Currently  Other Topics Concern   Not on file  Social History Narrative   Not on file   Social Determinants of Health   Financial Resource Strain: Low Risk    Difficulty of Paying Living Expenses: Not hard at all  Food Insecurity: No Food Insecurity   Worried About Charity fundraiser in the Last Year: Never true   [place] in the Last Year: Never true  Transportation Needs: No Transportation Needs   Lack of Transportation (Medical): No   Lack of Transportation (Non-Medical): No  Physical Activity: Not on file  Stress: No Stress Concern Present   Feeling of Stress : Not at all  Social Connections: Unknown   Frequency of Communication with Friends and Family: Not on file   Frequency of Social Gatherings with Friends and Family: Not on file   Attends Religious Services: Not on file   Active Member of Clubs or Organizations: Not on file   Attends Archivist Meetings: Not on file   Marital Status: Married     Review of Systems  Constitutional:  Negative for appetite change and unexpected weight change.  HENT:  Negative for congestion, sinus pressure and sore throat.   Eyes:  Negative for pain and visual disturbance.  Respiratory:  Negative for cough, chest  tightness and shortness of breath.   Cardiovascular:  Negative for chest pain, palpitations and leg swelling.  Gastrointestinal:  Negative for abdominal pain, diarrhea, nausea and vomiting.  Genitourinary:  Negative for difficulty urinating and dysuria.  Musculoskeletal:  Negative for joint swelling and myalgias.       Knee pain.   Skin:  Negative for color change and rash.  Neurological:  Negative for dizziness, light-headedness and headaches.  Hematological:  Negative for adenopathy. Does not bruise/bleed easily.  Psychiatric/Behavioral:  Negative for decreased concentration and dysphoric mood.       Objective:     BP 128/74    Pulse 90    Temp 97.6 F (36.4 C)    Resp 16    Ht '5\' 3"'  (1.6 m)    Wt 173 lb 6.4 oz (78.7 kg)    LMP 07/21/1988    SpO2 98%    BMI 30.72 kg/m  Wt Readings from Last 3 Encounters:  09/14/21 173 lb 6.4 oz (78.7 kg)  07/20/21 172 lb 3.2 oz (78.1 kg)  07/02/21 169 lb (76.7 kg)    Physical Exam Vitals reviewed.  Constitutional:      General: She is not in acute distress.    Appearance: Normal appearance. She is well-developed.  HENT:     Head: Normocephalic and atraumatic.     Right Ear: External ear normal.     Left Ear: External ear normal.  Eyes:     General: No scleral icterus.       Right eye: No discharge.        Left eye: No discharge.     Conjunctiva/sclera: Conjunctivae normal.  Neck:     Thyroid: No thyromegaly.  Cardiovascular:     Rate and Rhythm: Normal rate and regular rhythm.  Pulmonary:     Effort: No tachypnea, accessory muscle usage or respiratory distress.     Breath sounds: Normal breath sounds. No decreased breath sounds or wheezing.  Chest:  Breasts:    Right: No inverted nipple, mass, nipple discharge or tenderness (no axillary adenopathy).     Left: No inverted nipple, mass, nipple discharge or tenderness (no axilarry adenopathy).  Abdominal:     General: Bowel sounds are normal.     Palpations: Abdomen is soft.      Tenderness: There is no abdominal tenderness.  Musculoskeletal:        General: No swelling or tenderness.     Cervical back: Neck supple.  Lymphadenopathy:     Cervical: No cervical adenopathy.  Skin:    Findings: No erythema or rash.  Neurological:     Mental Status: She is alert and oriented to person, place, and time.  Psychiatric:        Mood and Affect: Mood normal.        Behavior: Behavior normal.     Outpatient Encounter Medications as of 09/14/2021  Medication Sig   amLODipine (NORVASC) 5 MG tablet Take 1 tablet (5 mg total) by mouth daily.   calcium-vitamin D (OSCAL-500) 500-400 MG-UNIT tablet Take 1 tablet by mouth daily.    ezetimibe (ZETIA) 10 MG tablet Take 1 tablet (10 mg total) by mouth daily.   fish oil-omega-3 fatty acids 1000 MG capsule 5 (five) times daily.    ibuprofen (ADVIL) 600 MG tablet Take 1 tablet (600 mg total) by mouth every 6 (six) hours as needed.   Multiple Vitamin (MULTIVITAMIN) tablet Take 1 tablet by mouth daily.   niacin (NIASPAN) 1000 MG CR tablet Take 1,000 mg by mouth 2 (two) times daily.   Probiotic Product (VSL#3 PO) Take by mouth. 1 capsul daily   triamcinolone cream (KENALOG) 0.1 % Apply 1 application topically as needed.    [DISCONTINUED] benazepril (LOTENSIN)  40 MG tablet Take 1 tablet (40 mg total) by mouth daily.   [DISCONTINUED] fluticasone (FLONASE) 50 MCG/ACT nasal spray Place 2 sprays into both nostrils as needed.   fluticasone (FLONASE) 50 MCG/ACT nasal spray Place 2 sprays into both nostrils as needed.   No facility-administered encounter medications on file as of 09/14/2021.     Lab Results  Component Value Date   WBC 5.8 04/26/2021   HGB 13.9 07/20/2021   HCT 41.0 07/20/2021   PLT 235 04/26/2021   GLUCOSE 108 (H) 07/20/2021   CHOL 214 (H) 06/09/2021   TRIG 76.0 06/09/2021   HDL 81.20 06/09/2021   LDLDIRECT 101.4 05/07/2013   LDLCALC 117 (H) 06/09/2021   ALT 23 04/26/2021   AST 29 04/26/2021   NA 143 07/20/2021   K  3.4 (L) 07/20/2021   CL 105 07/20/2021   CREATININE 0.60 07/20/2021   BUN 14 07/20/2021   CO2 29 05/12/2021   TSH 0.59 05/12/2021   HGBA1C 5.7 05/12/2021   MICROALBUR <0.7 04/19/2018       Assessment & Plan:   Problem List Items Addressed This Visit     Abnormal liver function test    Diet and exercise.  Follow liver panel.       Anemia    Follow cbc.       Health care maintenance    Physical today 09/14/21.  Mammogram 09/17/20.  F/u scheduled for next week.  Colonoscopy 06/29/21 - One 4 mm polyp in the cecum. One 2 mm polyp in the ascending colon. (polyp in the cecum). (tubular adenomas).  Moderate diverticulosis in the sigmoid colon. The distal rectum and anal verge are normal on retroflexion view.      Hypercholesterolemia    Intolerant to statin medication.  On zetia.  Low cholesterol diet and exercise.  Follow lipid panel and liver function tests.        Relevant Orders   Lipid panel   Hepatic function panel   Basic metabolic panel   Hyperglycemia    Low carb diet and exercise.  Follow met b and a1c.   Lab Results  Component Value Date   HGBA1C 5.7 05/12/2021       Relevant Orders   Hemoglobin A1c   Hypertension    Continue amlodipine, benazepril.  Blood pressure has been under good control on this regimen.  Follow pressures.  Follow metabolic panel.        Knee pain    Persistent knee pain.  Request referral to ortho - Dr Sharol Given.        Relevant Orders   Ambulatory referral to Orthopedic Surgery   Lung nodule     Incidental finding CT abdomen 04/26/21.  Discussed f/u chest CT.  Agreeable.        Relevant Orders   CT Chest Wo Contrast   Pain around toenail    Persistent.  Request referral to podiatry.       Relevant Orders   Ambulatory referral to Podiatry   Umbilical hernia    Noted on CT.  No pain to palpation.  Wants to discuss with Dr Bary Castilla.  Has f/u regarding her breast.  Plans to discuss.        Vaginal prolapse    She is s/p anterior  repair, sacrospinous ligament fixation and cystoscopy 07/20/21.  Has been followed by urogyn.       Other Visit Diagnoses     Routine general medical examination at a health care facility    -  Primary   Abnormal findings on diagnostic imaging of lung       Relevant Orders   CT Chest Wo Contrast        Einar Pheasant, MD

## 2021-09-18 ENCOUNTER — Other Ambulatory Visit: Payer: Self-pay | Admitting: Internal Medicine

## 2021-09-19 ENCOUNTER — Encounter: Payer: Self-pay | Admitting: Internal Medicine

## 2021-09-19 DIAGNOSIS — M79676 Pain in unspecified toe(s): Secondary | ICD-10-CM | POA: Insufficient documentation

## 2021-09-19 DIAGNOSIS — M25569 Pain in unspecified knee: Secondary | ICD-10-CM | POA: Insufficient documentation

## 2021-09-19 NOTE — Assessment & Plan Note (Signed)
Persistent.  Request referral to podiatry.

## 2021-09-19 NOTE — Assessment & Plan Note (Signed)
Diet and exercise.  Follow liver panel.   

## 2021-09-19 NOTE — Assessment & Plan Note (Signed)
Continue amlodipine, benazepril.  Blood pressure has been under good control on this regimen.  Follow pressures.  Follow metabolic panel.   

## 2021-09-19 NOTE — Assessment & Plan Note (Addendum)
Physical today 09/14/21.  Mammogram 09/17/20.  F/u scheduled for next week.  Colonoscopy 06/29/21 - One 4 mm polyp in the cecum. One 2 mm polyp in the ascending colon. (polyp in the cecum). (tubular adenomas).  Moderate diverticulosis in the sigmoid colon. The distal rectum and anal verge are normal on retroflexion view.

## 2021-09-19 NOTE — Assessment & Plan Note (Signed)
Low carb diet and exercise.  Follow met b and a1c.   Lab Results  Component Value Date   HGBA1C 5.7 05/12/2021

## 2021-09-19 NOTE — Assessment & Plan Note (Signed)
Noted on CT.  No pain to palpation.  Wants to discuss with Dr Bary Castilla.  Has f/u regarding her breast.  Plans to discuss.

## 2021-09-19 NOTE — Assessment & Plan Note (Signed)
Follow cbc.  

## 2021-09-19 NOTE — Assessment & Plan Note (Signed)
She is s/p anterior repair, sacrospinous ligament fixation and cystoscopy 07/20/21.  Has been followed by urogyn.

## 2021-09-19 NOTE — Assessment & Plan Note (Signed)
Intolerant to statin medication.  On zetia.  Low cholesterol diet and exercise.  Follow lipid panel and liver function tests.   

## 2021-09-19 NOTE — Assessment & Plan Note (Signed)
Persistent knee pain.  Request referral to ortho - Dr Sharol Given.

## 2021-09-19 NOTE — Assessment & Plan Note (Signed)
Incidental finding CT abdomen 04/26/21.  Discussed f/u chest CT.  Agreeable.

## 2021-09-28 ENCOUNTER — Other Ambulatory Visit: Payer: Self-pay

## 2021-09-28 ENCOUNTER — Ambulatory Visit: Payer: Self-pay

## 2021-09-28 ENCOUNTER — Ambulatory Visit (INDEPENDENT_AMBULATORY_CARE_PROVIDER_SITE_OTHER): Payer: Medicare Other | Admitting: Orthopedic Surgery

## 2021-09-28 DIAGNOSIS — M5441 Lumbago with sciatica, right side: Secondary | ICD-10-CM

## 2021-09-28 DIAGNOSIS — M25561 Pain in right knee: Secondary | ICD-10-CM | POA: Diagnosis not present

## 2021-09-28 DIAGNOSIS — M5442 Lumbago with sciatica, left side: Secondary | ICD-10-CM

## 2021-09-28 DIAGNOSIS — M25562 Pain in left knee: Secondary | ICD-10-CM | POA: Diagnosis not present

## 2021-09-28 DIAGNOSIS — G8929 Other chronic pain: Secondary | ICD-10-CM

## 2021-09-28 MED ORDER — PREDNISONE 10 MG PO TABS: 10.0000 mg | ORAL_TABLET | Freq: Every day | ORAL | 0 refills | Status: DC

## 2021-09-29 ENCOUNTER — Encounter: Payer: Self-pay | Admitting: Orthopedic Surgery

## 2021-09-29 ENCOUNTER — Encounter: Payer: Self-pay | Admitting: Internal Medicine

## 2021-09-29 DIAGNOSIS — Z853 Personal history of malignant neoplasm of breast: Secondary | ICD-10-CM | POA: Insufficient documentation

## 2021-09-29 NOTE — Progress Notes (Signed)
Office Visit Note   Patient: Helen Marshall           Date of Birth: [birth year]           MRN: [id number] Visit Date: 09/28/2021              Requested by: Einar Pheasant, [address] [place],  [place] [address] PCP: Einar Pheasant, MD  Chief Complaint  Patient presents with   Lower Back - Pain      HPI: Patient is a 76 year old woman who presents with multiple medical issues.  She complains of left knee pain as well as radicular symptoms from her back that radiate down the left leg.  Patient states she has had a history of injuries to her lumbar spine.  She states the back pain is worse after prolonged standing pain originally started with radicular symptoms on the left side now she has radicular symptoms on the right side as well also a history of bilateral knee arthritis.  Assessment & Plan: Visit Diagnoses:  1. Chronic bilateral low back pain with bilateral sciatica     Plan: We will start patient on prednisone 10 mg with breakfast follow-up in 4 weeks.  Anticipate we will need to obtain an MRI scan of her lumbar spine.  Follow-Up Instructions: Return in about 4 weeks (around 10/26/2021).   Ortho Exam  Patient is alert, oriented, no adenopathy, well-dressed, normal affect, normal respiratory effort. Examination there is crepitation with range of motion of the knees but no focal tenderness.  There is no pain with range of motion of either hip.  There is no effusion of either knee and they are not point tender to palpation.  Patient has a negative straight leg raise bilaterally motor strength is 5/5 in all motor groups of both lower extremities with no focal weakness.  Imaging: XR Lumbar Spine 2-3 Views  Result Date: 09/29/2021 2 view radiographs of the lumbar spine shows advanced degenerative disc disease with collapse and large bony spurs at L3-4 to the left  No images are attached to the encounter.  Labs: Lab Results  Component Value Date    HGBA1C 5.7 05/12/2021   HGBA1C 5.8 11/18/2020   HGBA1C 5.7 07/15/2020   ESRSEDRATE 1 03/30/2017     Lab Results  Component Value Date   ALBUMIN 4.0 04/26/2021   ALBUMIN 3.7 11/18/2020   ALBUMIN 4.0 07/15/2020    No results found for: MG Lab Results  Component Value Date   VD25OH 51.40 05/29/2019    No results found for: PREALBUMIN CBC EXTENDED Latest Ref Rng & Units 07/20/2021 04/26/2021 11/18/2020  WBC 4.0 - 10.5 K/uL - 5.8 6.1  RBC 3.87 - 5.11 MIL/uL - 3.99 4.27  HGB 12.0 - 15.0 g/dL 13.9 13.3 13.6  HCT 36.0 - 46.0 % 41.0 38.6 40.4  PLT 150 - 400 K/uL - 235 228.0  NEUTROABS 1.4 - 7.7 K/uL - - 2.9  LYMPHSABS 0.7 - 4.0 K/uL - - 2.4     There is no height or weight on file to calculate BMI.  Orders:  Orders Placed This Encounter  Procedures   XR Lumbar Spine 2-3 Views   Meds ordered this encounter  Medications   predniSONE (DELTASONE) 10 MG tablet    Sig: Take 1 tablet (10 mg total) by mouth daily with breakfast.    Dispense:  30 tablet    Refill:  0     Procedures: No procedures performed  Clinical Data: No  additional findings.  ROS:  All other systems negative, except as noted in the HPI. Review of Systems  Objective: Vital Signs: LMP 07/21/1988   Specialty Comments:  No specialty comments available.  PMFS History: Patient Active Problem List   Diagnosis Date Noted   Pain around toenail 09/19/2021   Knee pain 09/19/2021   Lung nodule 04/26/2021   Hypokalemia 11/20/2020   Back pain 07/23/2020   Tremor of both hands 03/16/2020   Vaginal prolapse 03/16/2020   Electrolyte abnormality 03/03/2020   Hand pain, left 06/06/2019   Osteopenia 05/27/2019   Estrogen deficiency 04/22/2019   Sinusitis 07/21/2017   Anemia 11/05/2015   Health care maintenance 70/62/3762   Umbilical hernia 83/15/1761   Neoplasm of left breast, 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   GERD (gastroesophageal reflux disease)  07/22/2012   Diverticulosis 07/21/2012   Hypertension 07/21/2012   Hypercholesterolemia 07/21/2012   Abnormal liver function test 07/21/2012   Left carotid bruit 07/21/2012   Hyperglycemia 07/21/2012   Environmental allergies 07/21/2012   Lumbar disc disease 07/21/2012   Past Medical History:  Diagnosis Date   Abnormal LFTs    Abnormal mammogram 12/19/2012   left   Allergy    Anemia    Cancer ([place]) 2014   left Breast   Carotid bruit    left   Cataract    Colon polyps    Diverticulosis 2012   Environmental allergies    GERD (gastroesophageal reflux disease)    Heart murmur    History of diverticulitis 05/2021   Hypercholesterolemia    Hyperglycemia    Hypertension    IBS (irritable bowel syndrome)    Lipoma of colon    Lumbar disc disease    Malignant neoplasm of upper-outer quadrant of female breast ([place]) 01/22/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   Tubular adenoma of colon    Umbilical hernia 60/7371    Family History  Problem Relation Age of Onset   CVA Father    Alcohol abuse Father    Heart disease Mother        myocardial infarction   Diabetes Mother    Hearing loss Mother    Hypertension Brother        x2   Arthritis Brother    Hearing loss Brother    Alcohol abuse Brother    Arthritis Brother    Hearing loss Brother    Arthritis/Rheumatoid Sister        x2   Breast cancer Sister 67   Arthritis Sister    Hearing loss Sister    Arthritis Sister    Hearing loss Sister    Asthma Sister    Arthritis Sister    Hearing loss Sister    Hypothyroidism Sister    Arthritis Sister    Hearing loss Sister    Cancer Other        breast - paternal first cousin   Colon cancer Maternal Uncle    Colon polyps Son    Colon polyps Sister    Arthritis Sister    Hearing loss Sister     Past Surgical History:   Procedure Laterality Date   ABDOMINAL HYSTERECTOMY  1989   fibroids   ANKLE SURGERY Left 1999   ANTERIOR AND POSTERIOR REPAIR WITH SACROSPINOUS FIXATION N/A 07/20/2021   Procedure: ANTERIOR AND REPAIR WITH  SACROSPINOUS FIXATION;  Surgeon: Jaquita Folds, MD;  Location: [facility];  Service: Gynecology;  Laterality: N/A;   BREAST SURGERY Left 1970   biopsy   CATARACT EXTRACTION, BILATERAL  June & July 2017   CHOLECYSTECTOMY  2001   COLONOSCOPY  06/02/2011   Verdie Shire, MD; diverticulosis, submucosal lipoma of the sigmoid colon.   CYSTOSCOPY N/A 07/20/2021   Procedure: CYSTOSCOPY;  Surgeon: Jaquita Folds, MD;  Location: [facility];  Service: Gynecology;  Laterality: N/A;   GALLBLADDER SURGERY     HERNIA REPAIR  2001, 2004   HERNIA REPAIR  01/22/2013   Repeat repair of umbilical defect, 2.5 cm. Primary repair.   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   shoulder   TUBAL LIGATION  1976   UPPER GI ENDOSCOPY  08/22/2014   Dr Billie Lade   Social History   Occupational History   Occupation: Retired  Tobacco Use   Smoking status: Never   Smokeless tobacco: Never  Scientific laboratory technician Use: Never used  Substance and Sexual Activity   Alcohol use: No    Alcohol/week: 0.0 standard drinks   Drug use: No   Sexual activity: Not Currently

## 2021-10-01 ENCOUNTER — Telehealth: Payer: Self-pay | Admitting: Orthopedic Surgery

## 2021-10-01 NOTE — Telephone Encounter (Signed)
Received call from pt. She would like lumbar spine xrays on CD. Please call when ready. [phone] ?

## 2021-10-01 NOTE — Telephone Encounter (Signed)
Patient aware CD is ready for pickup.  ?

## 2021-10-02 ENCOUNTER — Other Ambulatory Visit: Payer: Self-pay

## 2021-10-02 ENCOUNTER — Ambulatory Visit
Admission: RE | Admit: 2021-10-02 | Discharge: 2021-10-02 | Disposition: A | Discharge status: Home / Self Care | Payer: Medicare Other | Source: Ambulatory Visit | Attending: Internal Medicine | Admitting: Internal Medicine

## 2021-10-02 DIAGNOSIS — R918 Other nonspecific abnormal finding of lung field: Secondary | ICD-10-CM | POA: Insufficient documentation

## 2021-10-02 DIAGNOSIS — R911 Solitary pulmonary nodule: Secondary | ICD-10-CM | POA: Insufficient documentation

## 2021-10-07 ENCOUNTER — Other Ambulatory Visit (INDEPENDENT_AMBULATORY_CARE_PROVIDER_SITE_OTHER): Payer: Medicare Other

## 2021-10-07 ENCOUNTER — Other Ambulatory Visit: Payer: Self-pay

## 2021-10-07 DIAGNOSIS — E7800 Pure hypercholesterolemia, unspecified: Secondary | ICD-10-CM

## 2021-10-07 DIAGNOSIS — R739 Hyperglycemia, unspecified: Secondary | ICD-10-CM

## 2021-10-07 LAB — BASIC METABOLIC PANEL
BUN: 13 mg/dL (ref 6–23)
CO2: 31 mEq/L (ref 19–32)
Calcium: 9.3 mg/dL (ref 8.4–10.5)
Chloride: 104 mEq/L (ref 96–112)
Creatinine, Ser: 0.65 mg/dL (ref 0.40–1.20)
GFR: 85.66 mL/min (ref >60.00)
Glucose, Bld: 89 mg/dL (ref 70–99)
Potassium: 3.7 mEq/L (ref 3.5–5.1)
Sodium: 144 mEq/L (ref 135–145)

## 2021-10-07 LAB — LIPID PANEL
Cholesterol: 215 mg/dL — ABNORMAL HIGH (ref 0–200)
HDL: 91.7 mg/dL (ref >39.00)
LDL Cholesterol: 104 mg/dL — ABNORMAL HIGH (ref 0–99)
NonHDL: 123.71
Total CHOL/HDL Ratio: 2
Triglycerides: 99 mg/dL (ref 0.0–149.0)
VLDL: 19.8 mg/dL (ref 0.0–40.0)

## 2021-10-07 LAB — HEPATIC FUNCTION PANEL
ALT: 19 U/L (ref 0–35)
AST: 22 U/L (ref 0–37)
Albumin: 4.1 g/dL (ref 3.5–5.2)
Alkaline Phosphatase: 71 U/L (ref 39–117)
Bilirubin, Direct: 0.1 mg/dL (ref 0.0–0.3)
Total Bilirubin: 0.7 mg/dL (ref 0.2–1.2)
Total Protein: 6.2 g/dL (ref 6.0–8.3)

## 2021-10-07 LAB — HEMOGLOBIN A1C: Hgb A1c MFr Bld: 5.9 % (ref 4.6–6.5)

## 2021-10-14 ENCOUNTER — Other Ambulatory Visit: Payer: Self-pay | Admitting: Neurosurgery

## 2021-10-14 ENCOUNTER — Other Ambulatory Visit (HOSPITAL_COMMUNITY): Payer: Self-pay | Admitting: Neurosurgery

## 2021-10-14 DIAGNOSIS — G9519 Other vascular myelopathies: Secondary | ICD-10-CM

## 2021-10-24 ENCOUNTER — Ambulatory Visit
Admission: RE | Admit: 2021-10-24 | Discharge: 2021-10-24 | Disposition: A | Discharge status: Home / Self Care | Payer: Medicare Other | Source: Ambulatory Visit | Attending: Neurosurgery | Admitting: Neurosurgery

## 2021-10-24 ENCOUNTER — Other Ambulatory Visit: Payer: Self-pay

## 2021-10-24 DIAGNOSIS — G9519 Other vascular myelopathies: Secondary | ICD-10-CM | POA: Insufficient documentation

## 2021-10-26 ENCOUNTER — Other Ambulatory Visit: Payer: Self-pay

## 2021-10-26 ENCOUNTER — Ambulatory Visit (INDEPENDENT_AMBULATORY_CARE_PROVIDER_SITE_OTHER): Payer: Medicare Other | Admitting: Orthopedic Surgery

## 2021-10-26 DIAGNOSIS — M5442 Lumbago with sciatica, left side: Secondary | ICD-10-CM

## 2021-10-26 DIAGNOSIS — M5441 Lumbago with sciatica, right side: Secondary | ICD-10-CM | POA: Diagnosis not present

## 2021-10-26 DIAGNOSIS — G8929 Other chronic pain: Secondary | ICD-10-CM

## 2021-10-28 ENCOUNTER — Encounter: Payer: Self-pay | Admitting: Orthopedic Surgery

## 2021-10-28 NOTE — Progress Notes (Signed)
? ?Office Visit Note ?  ?Patient: Helen Marshall           ?Date of Birth: [birth year]           ?MRN: [id number] ?Visit Date: 10/26/2021 ?             ?Requested by: Einar Pheasant, MD ?[address] ?[address] ?[place],  [place] [address] ?PCP: Einar Pheasant, MD ? ?Chief Complaint  ?Patient presents with  ? Lower Back - Follow-up  ? ? ? ? ?HPI: ?Patient is a 76 year old woman who presents in follow-up for lower back pain with radicular symptoms.  Patient has completed a course of prednisone 10 mg in the morning and she states that this did help decrease her pain but she still has persistent symptoms.  Patient is also seen neurosurgery Dr. Christella Noa and an MRI scan has been ordered. ? ?Assessment & Plan: ?Visit Diagnoses:  ?1. Chronic bilateral low back pain with bilateral sciatica   ? ? ?Plan: Patient will follow-up with Dr. Christella Noa for evaluation for epidural steroid injection versus surgical intervention. ? ?Follow-Up Instructions: Return if symptoms worsen or fail to improve.  ? ?Ortho Exam ? ?Patient is alert, oriented, no adenopathy, well-dressed, normal affect, normal respiratory effort. ?Patient has a negative straight leg raise bilaterally there is no focal motor weakness ambulation she does have an abductor lurch consistent with some hip arthritis.  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 ? ?Imaging: ?No results found. ?No images are attached to the encounter. ? ?Labs: ?Lab Results  ?Component Value Date  ? HGBA1C 5.9 10/07/2021  ? HGBA1C 5.7 05/12/2021  ? HGBA1C 5.8 11/18/2020  ? ESRSEDRATE 1 03/30/2017  ? ? ? ?Lab Results  ?Component Value Date  ? ALBUMIN 4.1 10/07/2021  ? ALBUMIN 4.0 04/26/2021  ? ALBUMIN 3.7 11/18/2020  ? ? ?No results found for: MG ?Lab Results  ?Component Value Date  ? VD25OH 51.40 05/29/2019  ? ? ?No results found for: PREALBUMIN ? ?  Latest Ref Rng & Units 07/20/2021  ?  6:30 AM 04/26/2021  ?  5:22 AM 11/18/2020  ?  8:14 AM  ?CBC EXTENDED  ?WBC 4.0 - 10.5 K/uL  5.8   6.1    ?RBC  3.87 - 5.11 MIL/uL  3.99   4.27    ?Hemoglobin 12.0 - 15.0 g/dL 13.9   13.3   13.6    ?HCT 36.0 - 46.0 % 41.0   38.6   40.4    ?Platelets 150 - 400 K/uL  235   228.0    ?NEUT# 1.4 - 7.7 K/uL   2.9    ?Lymph# 0.7 - 4.0 K/uL   2.4    ? ? ? ?There is no height or weight on file to calculate BMI. ? ?Orders:  ?No orders of the defined types were placed in this encounter. ? ?No orders of the defined types were placed in this encounter. ? ? ? Procedures: ?No procedures performed ? ?Clinical Data: ?No additional findings. ? ?ROS: ? ?All other systems negative, except as noted in the HPI. ?Review of Systems ? ?Objective: ?Vital Signs: LMP 07/21/1988  ? ?Specialty Comments:  ?No specialty comments available. ? ?PMFS History: ?Patient Active Problem List  ? Diagnosis Date Noted  ? History of breast cancer 09/29/2021  ? Pain around toenail 09/19/2021  ? Knee pain 09/19/2021  ? Lung nodule 04/26/2021  ? Hypokalemia 11/20/2020  ? Back pain 07/23/2020  ? Tremor of both hands 03/16/2020  ? Vaginal prolapse  03/16/2020  ? Electrolyte abnormality 03/03/2020  ? Hand pain, left 06/06/2019  ? Osteopenia 05/27/2019  ? Estrogen deficiency 04/22/2019  ? Sinusitis 07/21/2017  ? Anemia 11/05/2015  ? Health care maintenance 11/17/2014  ? Umbilical hernia 82/95/6213  ? 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 GERD (gastroesophageal reflux disease) 07/22/2012  ? Diverticulosis 07/21/2012  ? Hypertension 07/21/2012  ? Hypercholesterolemia 07/21/2012  ? Abnormal liver function test 07/21/2012  ? Left carotid bruit 07/21/2012  ? Hyperglycemia 07/21/2012  ? Environmental allergies 07/21/2012  ? Lumbar disc disease 07/21/2012  ? ?Past Medical History:  ?Diagnosis Date  ? Abnormal LFTs   ? Abnormal mammogram 12/19/2012  ? left  ? Allergy   ? Anemia   ? Cancer [facility]) 2014  ? left Breast  ? Carotid bruit   ? left  ? Cataract   ? Colon polyps   ? Diverticulosis 2012  ? Environmental  allergies   ? GERD (gastroesophageal reflux disease)   ? Heart murmur   ? History of diverticulitis 05/2021  ? Hypercholesterolemia   ? Hyperglycemia   ? Hypertension   ? IBS (irritable bowel syndrome)   ? Lipoma of colon   ? Lumbar disc disease   ? 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 DCIS, ER 90%, 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  ? Tubular adenoma of colon   ? Umbilical hernia 03/6577  ?  ?Family History  ?Problem Relation Age of Onset  ? CVA Father   ? Alcohol abuse Father   ? Heart disease Mother   ?     myocardial infarction  ? Diabetes Mother   ? Hearing loss Mother   ? Hypertension Brother   ?     x2  ? Arthritis Brother   ? Hearing loss Brother   ? Alcohol abuse Brother   ? Arthritis Brother   ? Hearing loss Brother   ? Arthritis/Rheumatoid Sister   ?     x2  ? Breast cancer Sister 69  ? Arthritis Sister   ? Hearing loss Sister   ? Arthritis Sister   ? Hearing loss Sister   ? Asthma Sister   ? Arthritis Sister   ? Hearing loss Sister   ? Hypothyroidism Sister   ? Arthritis Sister   ? Hearing loss Sister   ? Cancer Other   ?     breast - paternal first cousin  ? Colon cancer Maternal Uncle   ? Colon polyps Son   ? Colon polyps Sister   ? Arthritis Sister   ? Hearing loss Sister   ?  ?Past Surgical History:  ?Procedure Laterality Date  ? ABDOMINAL HYSTERECTOMY  1989  ? fibroids  ? ANKLE SURGERY Left 1999  ? ANTERIOR AND POSTERIOR REPAIR WITH SACROSPINOUS FIXATION N/A 07/20/2021  ? Procedure: ANTERIOR AND REPAIR WITH SACROSPINOUS FIXATION;  Surgeon: Jaquita Folds, MD;  [facility];  Service: Gynecology;  Laterality: N/A;  ? BREAST SURGERY Left 1970  ? biopsy  ? CATARACT EXTRACTION, BILATERAL  June & July 2017  ? CHOLECYSTECTOMY  2001  ? COLONOSCOPY  06/02/2011  ? Verdie Shire, MD; diverticulosis, submucosal lipoma  of the sigmoid colon.  ? CYSTOSCOPY N/A 07/20/2021  ? Procedure: CYSTOSCOPY;  Surgeon: Jaquita Folds, MD;  Location: [facility];  Service: Gynecology;  Laterality: N/A;  ? GALLBLADDER SURGERY    ? HERNIA REPAIR  2001, 2004  ? HERNIA REPAIR  01/22/2013  ? Repeat repair of umbilical defect, 2.5 cm. Primary repair.  ? LAPAROSCOPIC HYSTERECTOMY    ? NASAL SINUS SURGERY  1997  ? rotator cuff surgery  1998  ? 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 shoulder  ? TUBAL LIGATION  1976  ? UPPER GI ENDOSCOPY  08/22/2014  ? Dr Billie Lade  ? ?Social History  ? ?Occupational History  ? Occupation: Retired  ?Tobacco Use  ? Smoking status: Never  ? Smokeless tobacco: Never  ?Vaping Use  ? Vaping Use: Never used  ?Substance and Sexual Activity  ? Alcohol use: No  ?  Alcohol/week: 0.0 standard drinks  ? Drug use: No  ? Sexual activity: Not Currently  ? ? ? ? ? ?

## 2021-11-13 ENCOUNTER — Other Ambulatory Visit: Payer: Self-pay | Admitting: Internal Medicine

## 2021-12-31 ENCOUNTER — Ambulatory Visit: Payer: Medicare Other

## 2022-01-04 ENCOUNTER — Ambulatory Visit (INDEPENDENT_AMBULATORY_CARE_PROVIDER_SITE_OTHER): Payer: Medicare Other

## 2022-01-04 VITALS — Ht 63.0 in | Wt 173.0 lb

## 2022-01-04 DIAGNOSIS — Z0000 Encounter for general adult medical examination without abnormal findings: Secondary | ICD-10-CM

## 2022-01-04 NOTE — Progress Notes (Signed)
Subjective:   Helen Marshall is a 76 y.o. female who presents for Medicare Annual (Subsequent) preventive examination.  Review of Systems    No ROS.  Medicare Wellness Virtual Visit.  Visual/audio telehealth visit, UTA vital signs.   See social history for additional risk factors.   Cardiac Risk Factors include: advanced age (>56mn, >>67women);hypertension     Objective:    Today's Vitals   01/04/22 1016  Weight: 173 lb (78.5 kg)  Height: '5\' 3"'$  (1.6 m)   Body mass index is 30.65 kg/m.     01/04/2022   10:28 AM 07/20/2021    6:07 AM 04/26/2021    5:22 AM 12/30/2020   11:32 AM 12/27/2019   11:29 AM 11/17/2018    3:16 PM 08/10/2018   11:24 AM  Advanced Directives  Does Patient Have a Medical Advance Directive? Yes Yes No Yes Yes Yes Yes  Type of AParamedicof [place] will   [place] will [place] will Living will Living will;Healthcare Power of Attorney  Does patient want to make changes to medical advance directive? No - Patient declined No - Patient declined  No - Patient declined No - Patient declined  No - Patient declined  Copy of [place] Chart? No - copy requested   No - copy requested No - copy requested  No - copy requested  Would patient like information on creating a medical advance directive?      No - Guardian declined     Current Medications (verified) Outpatient Encounter Medications as of 01/04/2022  Medication Sig   amLODipine (NORVASC) 5 MG tablet Take 1 tablet (5 mg total) by mouth daily.   benazepril (LOTENSIN) 40 MG tablet TAKE 1 TABLET BY MOUTH DAILY   calcium-vitamin D (OSCAL-500) 500-400 MG-UNIT tablet Take 1 tablet by mouth daily.    ezetimibe (ZETIA) 10 MG tablet TAKE 1 TABLET BY MOUTH DAILY   fish oil-omega-3 fatty acids 1000 MG capsule 5 (five) times daily.    fluticasone (FLONASE) 50 MCG/ACT nasal spray Place 2 sprays into both nostrils as  needed.   ibuprofen (ADVIL) 600 MG tablet Take 1 tablet (600 mg total) by mouth every 6 (six) hours as needed.   Multiple Vitamin (MULTIVITAMIN) tablet Take 1 tablet by mouth daily.   niacin (NIASPAN) 1000 MG CR tablet Take 1,000 mg by mouth 2 (two) times daily.   predniSONE (DELTASONE) 10 MG tablet Take 1 tablet (10 mg total) by mouth daily with breakfast.   Probiotic Product (VSL#3 PO) Take by mouth. 1 capsul daily   triamcinolone cream (KENALOG) 0.1 % Apply 1 application topically as needed.    No facility-administered encounter medications on file as of 01/04/2022.    Allergies (verified) Crestor [rosuvastatin], Demerol  [meperidine hcl], Demerol [meperidine], Gabapentin, Latex, Lipitor [atorvastatin], Lovastatin, Pravastatin, Tramadol, Vicodin [hydrocodone-acetaminophen], and Zocor [simvastatin]   History: Past Medical History:  Diagnosis Date   Abnormal LFTs    Abnormal mammogram 12/19/2012   left   Allergy    Anemia    Cancer ([place] 2014   left Breast   Carotid bruit    left   Cataract    Colon polyps    Diverticulosis 2012   Environmental allergies    GERD (gastroesophageal reflux disease)    Heart murmur    History of diverticulitis 05/2021   Hypercholesterolemia    Hyperglycemia    Hypertension    IBS (irritable bowel syndrome)  Lipoma of colon    Lumbar disc disease    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   Tubular adenoma of colon    Umbilical hernia 29/7989   Past Surgical History:  Procedure Laterality Date   ABDOMINAL HYSTERECTOMY  1989   fibroids   ANKLE SURGERY Left 1999   ANTERIOR AND POSTERIOR REPAIR WITH SACROSPINOUS FIXATION N/A 07/20/2021   Procedure: ANTERIOR AND REPAIR WITH SACROSPINOUS FIXATION;  Surgeon: Jaquita Folds, MD;   Location: [facility];  Service: Gynecology;  Laterality: N/A;   BREAST SURGERY Left 1970   biopsy   CATARACT EXTRACTION, BILATERAL  June & July 2017   CHOLECYSTECTOMY  2001   COLONOSCOPY  06/02/2011   Verdie Shire, MD; diverticulosis, submucosal lipoma of the sigmoid colon.   CYSTOSCOPY N/A 07/20/2021   Procedure: CYSTOSCOPY;  Surgeon: Jaquita Folds, MD;  Location[facility];  Service: Gynecology;  Laterality: N/A;   GALLBLADDER SURGERY     HERNIA REPAIR  2001, 2004   HERNIA REPAIR  01/22/2013   Repeat repair of umbilical defect, 2.5 cm. Primary repair.   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   shoulder   TUBAL LIGATION  1976   UPPER GI ENDOSCOPY  08/22/2014   Dr Billie Lade   Family History  Problem Relation Age of Onset   CVA Father    Alcohol abuse Father    Heart disease Mother        myocardial infarction   Diabetes Mother    Hearing loss Mother    Hypertension Brother        x2   Arthritis Brother    Hearing loss Brother    Alcohol abuse Brother    Arthritis Brother    Hearing loss Brother    Arthritis/Rheumatoid Sister        x2   Breast cancer Sister 33   Arthritis Sister    Hearing loss Sister    Arthritis Sister    Hearing loss Sister    Asthma Sister    Arthritis Sister    Hearing loss Sister    Hypothyroidism Sister    Arthritis Sister    Hearing loss Sister    Cancer Other        breast - paternal first cousin   Colon cancer Maternal Uncle    Colon polyps Son    Colon polyps Sister    Arthritis Sister    Hearing loss Sister    Social History   Socioeconomic History   Marital status: Married    Spouse name: Not on file   Number of children: 2   Years of education: Not on file   Highest education level: Not on file  Occupational History   Occupation: Retired  Tobacco Use   Smoking status: Never   Smokeless  tobacco: Never  Vaping Use   Vaping Use: Never used  Substance and Sexual Activity   Alcohol use: No    Alcohol/week: 0.0 standard drinks   Drug use: No   Sexual activity: Not Currently  Other Topics Concern   Not on file  Social History Narrative   Not on file   Social Determinants of  Health   Financial Resource Strain: Low Risk    Difficulty of Paying Living Expenses: Not hard at all  Food Insecurity: No Food Insecurity   Worried About [place] in the Last Year: Never true   Ran Out of Food in the Last Year: Never true  Transportation Needs: No Transportation Needs   Lack of Transportation (Medical): No   Lack of Transportation (Non-Medical): No  Physical Activity: Not on file  Stress: No Stress Concern Present   Feeling of Stress : Not at all  Social Connections: Unknown   Frequency of Communication with Friends and Family: Once a week   Frequency of Social Gatherings with Friends and Family: Once a week   Attends Religious Services: Not on Electrical engineer or Organizations: Not on file   Attends Archivist Meetings: Not on file   Marital Status: Married    Tobacco Counseling Counseling given: Not Answered   Clinical Intake:  Pre-visit preparation completed: Yes        Diabetes: No  How often do you need to have someone help you when you read instructions, pamphlets, or other written materials from your doctor or pharmacy?: 1 - Never    Interpreter Needed?: No      Activities of Daily Living    01/04/2022   10:53 AM 07/20/2021    6:11 AM  In your present state of health, do you have any difficulty performing the following activities:  Hearing? 0 0  Vision? 0 0  Difficulty concentrating or making decisions? 0 0  Walking or climbing stairs? 0 0  Dressing or bathing? 0 0  Doing errands, shopping? 0   Preparing Food and eating ? N   Using the Toilet? N   In the past six months, have you accidently leaked urine? N    Do you have problems with loss of bowel control? N   Managing your Medications? N   Managing your Finances? N   Housekeeping or managing your Housekeeping? N     Patient Care Team: Einar Pheasant, MD as PCP - General (Internal Medicine) Bary Castilla Forest Gleason, MD (General Surgery) Dasher, Rayvon Char, MD (Dermatology) Troxler, Rodman Key, DPM (Inactive) (Podiatry)  Indicate any recent Medical Services you may have received from other than [facility] providers in the past year (date may be approximate).     Assessment:   This is a routine wellness examination for [employer].  Virtual Visit via Telephone Note  I connected with  [name] on 01/04/22 at 10:15 AM EDT by telephone and verified that I am speaking with the correct person using two identifiers.  Persons participating in the virtual visit: patient/Nurse Health Advisor   I discussed the limitations of performing an evaluation and management service by telehealth. We continued and completed visit with audio only. Some vital signs may be absent or patient reported.   Hearing/Vision screen Hearing Screening - Comments:: Patient is able to hear conversational tones without difficulty.  No issues reported. Vision Screening - Comments:: Wears corrective lenses when reading  Cataract extraction, bilateral  They have seen their ophthalmologist in the last 12 months.   Dietary issues and exercise activities discussed: Current Exercise Habits: Home exercise routine, Intensity: Mild Healthy diet Good water intake   Goals Addressed               This Visit's Progress     Patient Stated     Maintain Healthy Lifestyle (pt-stated)  Depression Screen    01/04/2022   10:26 AM 12/30/2020   11:29 AM 12/27/2019   11:17 AM 08/29/2019   10:54 AM 08/10/2018   11:26 AM 08/09/2017   10:19 AM 07/19/2017   11:11 AM  PHQ 2/9 Scores  PHQ - 2 Score 0 0 0 0 1 0 0  PHQ- 9 Score      0     Fall Risk    01/04/2022   10:53 AM 12/30/2020   11:35  AM 12/27/2019   11:16 AM 11/29/2019    1:42 PM 09/19/2018   10:40 AM  [place] in the past year? 0 0 0 0 0  Number falls in past yr: 0 0 0  0  Injury with Fall?  0     Follow up Falls evaluation completed Falls evaluation completed  Falls evaluation completed Falls evaluation completed    [place]: Home free of loose throw rugs in walkways, pet beds, electrical cords, etc? Yes  Adequate lighting in your home to reduce risk of falls? Yes   ASSISTIVE DEVICES UTILIZED TO PREVENT FALLS: Life alert? No  Use of a cane, walker or w/c? Yes  Grab bars in the bathroom? No  Shower chair or bench in shower? No  Elevated toilet seat or a handicapped toilet? No   TIMED UP AND GO: Was the test performed? No .   Cognitive Function: Patient is alert and oriented x3.  Enjoys brain health puzzles for cognitive stimulation.       08/09/2017   10:27 AM 07/12/2016    8:43 AM 07/17/2015    1:37 PM  MMSE - Mini Mental State Exam  Orientation to time '5 5 5  '$ Orientation to Place '5 5 5  '$ Registration '3 3 3  '$ Attention/ Calculation '5 5 5  '$ Recall '3 3 3  '$ Language- name 2 objects '2 2 2  '$ Language- repeat '1 1 1  '$ Language- follow 3 step command '3 3 3  '$ Language- read & follow direction '1 1 1  '$ Write a sentence '1 1 1  '$ Copy design '1 1 1  '$ Total score '30 30 30        '$ 12/30/2020   11:43 AM 12/27/2019   11:17 AM 08/10/2018   11:27 AM  6CIT Screen  What Year? 0 points 0 points 0 points  What month? 0 points 0 points 0 points  What time? 0 points 0 points 0 points  Count back from 20 0 points 0 points 0 points  Months in reverse 0 points 0 points 0 points  Repeat phrase 0 points 0 points 0 points  Total Score 0 points 0 points 0 points    Immunizations Immunization History  Administered Date(s) Administered   Fluad Quad(high Dose 65+) 04/19/2019, 07/15/2020, 05/12/2021   Influenza Split 05/16/2012, 04/02/2017   Influenza, High Dose Seasonal PF 05/15/2016,  03/30/2017, 05/25/2018   Influenza,inj,Quad PF,6+ Mos 05/09/2013, 05/23/2014, 07/03/2015   PFIZER Comirnaty(Gray Top)Covid-19 Tri-Sucrose Vaccine 03/31/2020   PFIZER(Purple Top)SARS-COV-2 Vaccination 08/09/2019, 09/01/2019, 03/31/2020, 11/21/2020   Pneumococcal Conjugate-13 09/13/2013   Pneumococcal Polysaccharide-23 11/08/2014   Pneumococcal-Unspecified 08/02/2009   Tdap 04/26/2014   Zoster Recombinat (Shingrix) 01/03/2018, 03/21/2018   Zoster, Live 08/02/2009   Screening Tests Health Maintenance  Topic Date Due   COVID-19 Vaccine (6 - Booster for [place] series) 01/20/2022 (Originally 01/16/2021)   INFLUENZA VACCINE  03/02/2022   MAMMOGRAM  09/23/2022   TETANUS/TDAP  04/26/2024   Pneumonia Vaccine 65+  Years old  Completed   DEXA SCAN  Completed   Hepatitis C Screening  Completed   Zoster Vaccines- Shingrix  Completed   HPV VACCINES  Aged Out   COLONOSCOPY (Pts 45-34yr Insurance coverage will need to be confirmed)  Discontinued   Health Maintenance There are no preventive care reminders to display for this patient.  Lung Cancer Screening: (Low Dose CT Chest recommended if Age 602-80years, 30 pack-year currently smoking OR have quit w/in 15years.) does not qualify.   Vision Screening: Recommended annual ophthalmology exams for early detection of glaucoma and other disorders of the eye.  Dental Screening: Recommended annual dental exams for proper oral hygiene  Community Resource Referral / Chronic Care Management: CRR required this visit?  No   CCM required this visit?  No      Plan:   Keep all routine maintenance appointments.   I have personally reviewed and noted the following in the patient's chart:   Medical and social history Use of alcohol, tobacco or illicit drugs  Current medications and supplements including opioid prescriptions.  Functional ability and status Nutritional status Physical activity Advanced directives List of other  physicians Hospitalizations, surgeries, and ER visits in previous 12 months Vitals Screenings to include cognitive, depression, and falls Referrals and appointments  In addition, I have reviewed and discussed with patient certain preventive protocols, quality metrics, and best practice recommendations. A written personalized care plan for preventive services as well as general preventive health recommendations were provided to patient.     OVarney Biles LPN   65/01/176

## 2022-01-04 NOTE — Patient Instructions (Addendum)
  Ms. Tappan , Thank you for taking time to come for your Medicare Wellness Visit. I appreciate your ongoing commitment to your health goals. Please review the following plan we discussed and let me know if I can assist you in the future.   These are the goals we discussed:  Goals       Patient Stated     Maintain Healthy Lifestyle (pt-stated)        This is a list of the screening recommended for you and due dates:  Health Maintenance  Topic Date Due   COVID-19 Vaccine (6 - Booster for Pfizer series) 01/20/2022*   Flu Shot  03/02/2022   Mammogram  09/23/2022   Tetanus Vaccine  04/26/2024   Pneumonia Vaccine  Completed   DEXA scan (bone density measurement)  Completed   Hepatitis C Screening: USPSTF Recommendation to screen - Ages 47-79 yo.  Completed   Zoster (Shingles) Vaccine  Completed   HPV Vaccine  Aged Out   Colon Cancer Screening  Discontinued  *Topic was postponed. The date shown is not the original due date.

## 2022-01-05 ENCOUNTER — Ambulatory Visit: Payer: Medicare Other

## 2022-01-12 ENCOUNTER — Encounter: Payer: Self-pay | Admitting: Internal Medicine

## 2022-01-12 ENCOUNTER — Ambulatory Visit: Payer: Medicare Other | Admitting: Internal Medicine

## 2022-01-12 VITALS — BP 138/80 | HR 77 | Temp 98.5°F | Resp 16 | Ht 63.0 in | Wt 169.6 lb

## 2022-01-12 DIAGNOSIS — E7800 Pure hypercholesterolemia, unspecified: Secondary | ICD-10-CM

## 2022-01-12 DIAGNOSIS — K429 Umbilical hernia without obstruction or gangrene: Secondary | ICD-10-CM

## 2022-01-12 DIAGNOSIS — I10 Essential (primary) hypertension: Secondary | ICD-10-CM

## 2022-01-12 DIAGNOSIS — Z853 Personal history of malignant neoplasm of breast: Secondary | ICD-10-CM

## 2022-01-12 DIAGNOSIS — R911 Solitary pulmonary nodule: Secondary | ICD-10-CM

## 2022-01-12 DIAGNOSIS — R7989 Other specified abnormal findings of blood chemistry: Secondary | ICD-10-CM

## 2022-01-12 DIAGNOSIS — R739 Hyperglycemia, unspecified: Secondary | ICD-10-CM

## 2022-01-12 DIAGNOSIS — N8110 Cystocele, unspecified: Secondary | ICD-10-CM

## 2022-01-12 DIAGNOSIS — D649 Anemia, unspecified: Secondary | ICD-10-CM

## 2022-01-12 DIAGNOSIS — M545 Low back pain, unspecified: Secondary | ICD-10-CM

## 2022-01-12 LAB — BASIC METABOLIC PANEL
BUN: 10 mg/dL (ref 6–23)
CO2: 29 mEq/L (ref 19–32)
Calcium: 9.3 mg/dL (ref 8.4–10.5)
Chloride: 104 mEq/L (ref 96–112)
Creatinine, Ser: 0.64 mg/dL (ref 0.40–1.20)
GFR: 85.82 mL/min (ref >60.00)
Glucose, Bld: 94 mg/dL (ref 70–99)
Potassium: 3.6 mEq/L (ref 3.5–5.1)
Sodium: 140 mEq/L (ref 135–145)

## 2022-01-12 LAB — LIPID PANEL
Cholesterol: 214 mg/dL — ABNORMAL HIGH (ref 0–200)
HDL: 84.4 mg/dL (ref >39.00)
LDL Cholesterol: 116 mg/dL — ABNORMAL HIGH (ref 0–99)
NonHDL: 129.22
Total CHOL/HDL Ratio: 3
Triglycerides: 68 mg/dL (ref 0.0–149.0)
VLDL: 13.6 mg/dL (ref 0.0–40.0)

## 2022-01-12 LAB — HEPATIC FUNCTION PANEL
ALT: 18 U/L (ref 0–35)
AST: 24 U/L (ref 0–37)
Albumin: 4.1 g/dL (ref 3.5–5.2)
Alkaline Phosphatase: 72 U/L (ref 39–117)
Bilirubin, Direct: 0.1 mg/dL (ref 0.0–0.3)
Total Bilirubin: 0.6 mg/dL (ref 0.2–1.2)
Total Protein: 6.2 g/dL (ref 6.0–8.3)

## 2022-01-12 LAB — HEMOGLOBIN A1C: Hgb A1c MFr Bld: 5.6 % (ref 4.6–6.5)

## 2022-01-12 NOTE — Progress Notes (Signed)
Patient ID: Helen Marshall, female   DOB: 09-Jul-1946, 76 y.o.   MRN: [id number]   Subjective:    Patient ID: Helen Marshall, female    DOB: 1946-04-12, 76 y.o.   MRN: [id number]   Patient here for a scheduled follow up.   Chief Complaint  Patient presents with   Follow-up   Hypertension   Anemia   .   HPI Increased stress.  Sister just passed. Discussed.  Has good support. Does not feel needs any further intervention.  Tries to stay active. No chest pain or sob reported.  Has been seeing Dr Sharol Given and Dr Christella Noa for chronic low back pain. S/p injection.  Is better.  Continue f/u with [facility]. Has seen Dr Bary Castilla - f/u breast cancer.  Last seen 09/2021 - recommended annual screening mammogram through PCP.  No nausea or vomiting reported.  No abdominal pain reported.  Drinks buttermilk q hs.  Recent CT chest - f/u lung nodule - stable.  Has hernia.  Has seen Dr Bary Castilla.  Request referral back.     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   Tubular adenoma of colon    Umbilical hernia 78/2956   Past Surgical History:  Procedure Laterality Date   ABDOMINAL HYSTERECTOMY  1989   fibroids   ANKLE SURGERY Left 1999   ANTERIOR AND POSTERIOR REPAIR WITH SACROSPINOUS FIXATION N/A 07/20/2021   Procedure:  ANTERIOR AND REPAIR WITH SACROSPINOUS FIXATION;  Surgeon: Jaquita Folds, MD;  Location: [facility];  Service: Gynecology;  Laterality: N/A;   BREAST SURGERY Left 1970   biopsy   CATARACT EXTRACTION, BILATERAL  June & July 2017   CHOLECYSTECTOMY  2001   COLONOSCOPY  06/02/2011   Verdie Shire, MD; diverticulosis, submucosal lipoma of the sigmoid colon.   CYSTOSCOPY N/A 07/20/2021   Procedure: CYSTOSCOPY;  Surgeon: Jaquita Folds, MD;  Location: [facility];  Service: Gynecology;  Laterality: N/A;   GALLBLADDER SURGERY     HERNIA REPAIR  2001, 2004   HERNIA REPAIR  01/22/2013   Repeat repair of umbilical defect, 2.5 cm. Primary repair.   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   shoulder   TUBAL LIGATION  1976   UPPER GI ENDOSCOPY  08/22/2014   Dr Billie Lade   Family History  Problem Relation Age of Onset   CVA Father    Alcohol abuse Father    Heart disease Mother        myocardial infarction   Diabetes Mother  Hearing loss Mother    Hypertension Brother        x2   Arthritis Brother    Hearing loss Brother    Alcohol abuse Brother    Arthritis Brother    Hearing loss Brother    Arthritis/Rheumatoid Sister        x2   Breast cancer Sister 11   Arthritis Sister    Hearing loss Sister    Arthritis Sister    Hearing loss Sister    Asthma Sister    Arthritis Sister    Hearing loss Sister    Hypothyroidism Sister    Arthritis Sister    Hearing loss Sister    Cancer Other        breast - paternal first cousin   Colon cancer Maternal Uncle    Colon polyps Son    Colon polyps Sister    Arthritis Sister    Hearing loss Sister    Social History   Socioeconomic History   Marital status: Married    Spouse name: Not on file   Number of children: 2   Years of education: Not on file   Highest education level: Not on file  Occupational  History   Occupation: Retired  Tobacco Use   Smoking status: Never   Smokeless tobacco: Never  Vaping Use   Vaping Use: Never used  Substance and Sexual Activity   Alcohol use: No    Alcohol/week: 0.0 standard drinks of alcohol   Drug use: No   Sexual activity: Not Currently  Other Topics Concern   Not on file  Social History Narrative   Not on file   Social Determinants of Health   Financial Resource Strain: Low Risk  (01/04/2022)   Overall Financial Resource Strain (CARDIA)    Difficulty of Paying Living Expenses: Not hard at all  Food Insecurity: No Food Insecurity (01/04/2022)   Hunger Vital Sign    Worried About Running Out of Food in the Last Year: Never true    Ran Out of Food in the Last Year: Never true  Transportation Needs: No Transportation Needs (01/04/2022)   PRAPARE - Hydrologist (Medical): No    Lack of Transportation (Non-Medical): No  Physical Activity: Unknown (08/10/2018)   Exercise Vital Sign    Days of Exercise per Week: 0 days    Minutes of Exercise per Session: Not on file  Stress: No Stress Concern Present (01/04/2022)   [place]    Feeling of Stress : Not at all  Social Connections: Unknown (01/04/2022)   Social Connection and Isolation Panel [NHANES]    Frequency of Communication with Friends and Family: Once a week    Frequency of Social Gatherings with Friends and Family: Once a week    Attends Religious Services: Not on Advertising copywriter or Organizations: Not on file    Attends Archivist Meetings: Not on file    Marital Status: Married     Review of Systems  Constitutional:  Negative for appetite change and unexpected weight change.  HENT:  Negative for congestion and sinus pressure.   Respiratory:  Negative for cough, chest tightness and shortness of breath.   Cardiovascular:  Negative for chest pain, palpitations and leg  swelling.  Gastrointestinal:  Negative for abdominal pain, diarrhea, nausea and vomiting.  Genitourinary:  Negative for difficulty urinating and dysuria.  Musculoskeletal:  Negative for  joint swelling and myalgias.  Skin:  Negative for color change and rash.  Neurological:  Negative for dizziness, light-headedness and headaches.  Psychiatric/Behavioral:  Negative for agitation and dysphoric mood.        Objective:     BP 138/80 (BP Location: Left Arm, Patient Position: Sitting, Cuff Size: Small)   Pulse 77   Temp 98.5 F (36.9 C) (Temporal)   Resp 16   Ht '5\' 3"'  (1.6 m)   Wt 169 lb 9.6 oz (76.9 kg)   LMP 07/21/1988   SpO2 96%   BMI 30.04 kg/m  Wt Readings from Last 3 Encounters:  01/12/22 169 lb 9.6 oz (76.9 kg)  01/04/22 173 lb (78.5 kg)  09/14/21 173 lb 6.4 oz (78.7 kg)    Physical Exam Vitals reviewed.  Constitutional:      General: She is not in acute distress.    Appearance: Normal appearance.  HENT:     Head: Normocephalic and atraumatic.     Right Ear: External ear normal.     Left Ear: External ear normal.  Eyes:     General: No scleral icterus.       Right eye: No discharge.        Left eye: No discharge.     Conjunctiva/sclera: Conjunctivae normal.  Neck:     Thyroid: No thyromegaly.  Cardiovascular:     Rate and Rhythm: Normal rate and regular rhythm.  Pulmonary:     Effort: No respiratory distress.     Breath sounds: Normal breath sounds. No wheezing.  Abdominal:     General: Bowel sounds are normal.     Palpations: Abdomen is soft.     Tenderness: There is no abdominal tenderness.  Musculoskeletal:        General: No swelling or tenderness.     Cervical back: Neck supple. No tenderness.  Lymphadenopathy:     Cervical: No cervical adenopathy.  Skin:    Findings: No erythema or rash.  Neurological:     Mental Status: She is alert.  Psychiatric:        Mood and Affect: Mood normal.        Behavior: Behavior normal.      Outpatient  Encounter Medications as of 01/12/2022  Medication Sig   amLODipine (NORVASC) 5 MG tablet Take 1 tablet (5 mg total) by mouth daily.   benazepril (LOTENSIN) 40 MG tablet TAKE 1 TABLET BY MOUTH DAILY   calcium-vitamin D (OSCAL-500) 500-400 MG-UNIT tablet Take 1 tablet by mouth daily.    ezetimibe (ZETIA) 10 MG tablet TAKE 1 TABLET BY MOUTH DAILY   fish oil-omega-3 fatty acids 1000 MG capsule 5 (five) times daily.    fluticasone (FLONASE) 50 MCG/ACT nasal spray Place 2 sprays into both nostrils as needed.   ibuprofen (ADVIL) 600 MG tablet Take 1 tablet (600 mg total) by mouth every 6 (six) hours as needed.   Multiple Vitamin (MULTIVITAMIN) tablet Take 1 tablet by mouth daily.   niacin (NIASPAN) 1000 MG CR tablet Take 1,000 mg by mouth 2 (two) times daily.   Probiotic Product (VSL#3 PO) Take by mouth. 1 capsul daily   triamcinolone cream (KENALOG) 0.1 % Apply 1 application topically as needed.    [DISCONTINUED] predniSONE (DELTASONE) 10 MG tablet Take 1 tablet (10 mg total) by mouth daily with breakfast.   No facility-administered encounter medications on file as of 01/12/2022.     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 Recently pain started to radiate down her LT leg and her RT hip x6 months, NKI. EXAM: MRI LUMBAR SPINE WITHOUT  CONTRAST TECHNIQUE: Multiplanar, multisequence MR imaging of the lumbar spine was performed. No intravenous contrast was administered. COMPARISON:  Lumbar radiographs September 28, 2021 without report. FINDINGS: Segmentation: Standard segmentation is assumed. The inferior-most fully formed intervertebral disc labeled L5-S1. Alignment: Dextrocurvature. Slight (grade 1) anterolisthesis of L5 on S1. Otherwise, no substantial sagittal subluxation. Vertebrae: Degenerative/discogenic endplate signal changes are greatest at L2-L3 and L4-L5. No specific evidence of acute fracture or discitis/osteomyelitis. Mildly heterogeneous marrow without suspicious bone lesion. Conus medullaris and cauda equina: Conus extends to the L1 level. Conus appears normal. Paraspinal and other soft tissues: Unremarkable. Disc levels: T12-L1: No significant disc protrusion, foraminal stenosis, or canal stenosis. L1-L2: Mild disc bulging without significant canal or foraminal stenosis. L2-L3: Degenerative disc height loss. Disc bulging and endplate spurring and mild bilateral facet arthropathy. Resulting mild bilateral foraminal stenosis. No significant canal stenosis. L3-L4: Broad disc bulge with endplate spurring. Moderate bilateral facet arthropathy. Resulting moderate canal stenosis and right foraminal stenosis. Mild left foraminal stenosis. L4-L5: Degenerative disc height loss. Broad disc bulge with endplate spurring. Moderate right and mild left facet arthropathy. Mild canal and bilateral subarticular recess stenosis. Resulting mild to moderate right and mild left foraminal stenosis. L5-S1: Slight (grade 1 anterolisthesis. Right eccentric disc bulge with moderate to severe bilateral facet arthropathy. Resulting moderate to severe right and mild left foraminal stenosis. Mild canal and right greater than left subarticular recess stenosis. IMPRESSION: 1. At L5-S1, moderate to severe right and mild left foraminal stenosis with mild canal and  right greater than left subarticular recess stenosis. 2. At L3-L4, moderate canal and right foraminal stenosis with mild left foraminal stenosis. 3. At L4-L5, mild-to-moderate right and mild left foraminal stenosis with mild canal stenosis. 4. At L2-L3, mild bilateral foraminal stenosis. Electronically Signed   By: Margaretha Sheffield M.D.   On: 10/27/2021 08:45       Assessment & Plan:   Problem List Items Addressed This Visit     Abnormal liver function test    Diet and exercise.  Follow liver panel.       Anemia    Follow cbc.       Back pain    Chronic low back pain as outlined.  Seeing Dr Christella Noa.  S/p injection.  Doing some better.  Follow       History of breast cancer    Mammogram 09/18/21 - birads I.  Saw Dr Bary Castilla.  Recommended continuing annual screening mammogram through PCP.       Hypercholesterolemia    Intolerant to statin medication.  On zetia.  Low cholesterol diet and exercise.  Follow lipid  panel and liver function tests.        Relevant Orders   Lipid panel (Completed)   Hepatic function panel (Completed)   Hyperglycemia    Low carb diet and exercise.  Follow met b and a1c.   Lab Results  Component Value Date   HGBA1C 5.6 01/12/2022       Relevant Orders   Hemoglobin A1c (Completed)   Hypertension - Primary    Continue amlodipine, benazepril.  Blood pressure has been under good control on this regimen.  Follow pressures.  Follow metabolic panel.        Relevant Orders   Basic metabolic panel (Completed)   Lung nodule     Incidental finding CT abdomen 04/26/21.  Had f/u chest CT 10/02/21 - stable 43m right lower lobe pulmonary nodule.  Discussed.         Umbilical hernia    Noted on CT.  No pain to palpation.  Discussed with Dr BBary Castilla  Request referral back for evaluation.        Vaginal prolapse    She is s/p anterior repair, sacrospinous ligament fixation and cystoscopy 07/20/21.  Has been followed by urogyn.         CEinar Pheasant MD

## 2022-01-20 ENCOUNTER — Telehealth: Payer: Self-pay | Admitting: Internal Medicine

## 2022-01-20 ENCOUNTER — Encounter: Payer: Self-pay | Admitting: Internal Medicine

## 2022-01-20 NOTE — Assessment & Plan Note (Signed)
Diet and exercise.  Follow liver panel.   

## 2022-01-20 NOTE — Assessment & Plan Note (Signed)
Mammogram 09/18/21 - birads I.  Saw Dr Byrnett.  Recommended continuing annual screening mammogram through PCP.  

## 2022-01-20 NOTE — Assessment & Plan Note (Signed)
Intolerant to statin medication.  On zetia.  Low cholesterol diet and exercise.  Follow lipid panel and liver function tests.   

## 2022-01-20 NOTE — Assessment & Plan Note (Signed)
She is s/p anterior repair, sacrospinous ligament fixation and cystoscopy 07/20/21.  Has been followed by urogyn.

## 2022-01-20 NOTE — Telephone Encounter (Signed)
Please call and confirm with pt that she wants a f/u appt with Dr Bary Castilla to evaluate her hernia.  If so, let me know and I will message him.

## 2022-01-20 NOTE — Assessment & Plan Note (Signed)
Low carb diet and exercise.  Follow met b and a1c.   Lab Results  Component Value Date   HGBA1C 5.6 01/12/2022

## 2022-01-20 NOTE — Telephone Encounter (Signed)
S/w pt - stated she is still thinking about it. Knows she has to do it by November, before he retires. Stated she will call him if she decides to go through with repair.

## 2022-01-20 NOTE — Assessment & Plan Note (Signed)
Noted on CT.  No pain to palpation.  Discussed with Dr Bary Castilla.  Request referral back for evaluation.

## 2022-01-20 NOTE — Assessment & Plan Note (Signed)
Continue amlodipine, benazepril.  Blood pressure has been under good control on this regimen.  Follow pressures.  Follow metabolic panel.   

## 2022-01-20 NOTE — Assessment & Plan Note (Signed)
Chronic low back pain as outlined.  Seeing Dr Christella Noa.  S/p injection.  Doing some better.  Follow

## 2022-01-20 NOTE — Assessment & Plan Note (Signed)
Incidental finding CT abdomen 04/26/21.  Had f/u chest CT 10/02/21 - stable 11m right lower lobe pulmonary nodule.  Discussed.

## 2022-01-20 NOTE — Assessment & Plan Note (Signed)
Follow cbc.  

## 2022-02-12 ENCOUNTER — Other Ambulatory Visit: Payer: Self-pay | Admitting: Internal Medicine

## 2022-02-20 ENCOUNTER — Other Ambulatory Visit: Payer: Self-pay | Admitting: Internal Medicine

## 2022-03-08 ENCOUNTER — Other Ambulatory Visit: Payer: Self-pay | Admitting: General Surgery

## 2022-03-08 NOTE — Progress Notes (Signed)
Progress Notes - documented in this encounter Berline Semrad, Geronimo Boot, MD - 02/25/2022 4:00 PM EDT Formatting of this note is different from the original. Images from the original note were not included. Subjective:   Patient ID: Helen Marshall is a 76 y.o. female.  HPI  The following portions of the patient's history were reviewed and updated as appropriate.  This an established patient is here today for: office visit. Here for re evaluation of a hernia and discuss surgery. She states she has decided to have surgery.  The patient had what sounds like cholecystectomy for acute cholecystitis and early 2001.   Bowels move regular, sometimes she has to push her abdomen in during the BM. This is new from the time of her February 2023 evaluation.  Review of Systems  Constitutional: Negative for chills and fever.  Respiratory: Negative for cough.   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 Complex sclerosing lesion 0.6 cm. Margin 5 mm, ER/PR positive. MammoSite.  Bursitis  Carotid bruit  Cataract cortical, senile  Colon polyps  Diverticulosis  Environmental allergies  GERD (gastroesophageal reflux disease)  H/O psoriasis  Heart murmur, unspecified  History of radiation therapy  Hypercholesteremia  Hyperglycemia, unspecified  Hypertension  IBS (irritable bowel syndrome)  Lipoma of colon  Lumbar disc disease  Tubular adenoma of colon 2018  Zenovia Jarred, MD    Past Surgical History:  Procedure Laterality Date  BREAST EXCISIONAL BIOPSY Left [place]  nasal sinus surgery 1997  ankle surgery Left 1999  CHOLECYSTECTOMY 2001  COLONOSCOPY 06/02/2011  HERNIA REPAIR 45/62/5638   umbilical, primary repair of a 2.5 cm defect  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  EGD 08/22/2014  EYE SURGERY Bilateral 2017  cataract  ANTERIOR AND POSTERIOR VAGINAL REPAIR W/ SACROSPINOUS LIGAMENT SUSPENSION 07/2021  ARTHROSCOPIC ROTATOR CUFF REPAIR Left 1990s  BREAST SURGERY  history of hernia repair  2001, 2004    OB History   Gravida  2  Para  2  Term  2  Preterm   AB   Living  2    SAB   IAB   Ectopic   Molar   Multiple   Live Births     Obstetric Comments  Age at first period 5 Age of first pregnancy 15 LMP 42      Social History   Socioeconomic History  Marital status: Married  Tobacco Use  Smoking status: Never  Smokeless tobacco: Never  Vaping Use  Vaping Use: Never used  Substance and Sexual Activity  Alcohol use: No  Drug use: No  Sexual activity: Not Currently  Partners: Male  Birth control/protection: Surgical    Allergies  Allergen Reactions  Hydrocodone-Acetaminophen Anaphylaxis, Itching and Other (See Comments)  itching  Meperidine Anaphylaxis and Nausea And Vomiting  Rx Balance Statin Other (See Comments)  Other Reaction: Other reaction  Atorvastatin Unknown  Intolerant  Gabapentin Other (See Comments)  Lovastatin Unknown  Elevated CK  Pravastatin Unknown  intolerant  Rosuvastatin Unknown  Leg cramping  Simvastatin Unknown and Other (See Comments)  Intolerant  Statins-Hmg-Coa Reductase Inhibitors Other (See Comments)  Tramadol Nausea  Latex Unknown  Redness, hands breakout   Current Outpatient Medications  Medication Sig Dispense Refill  albuterol 90 mcg/actuation inhaler USE 2 INHALATIONS EVERY 6 HOURS AS NEEDED FOR WHEEZING OR SHORTNESS OF BREATH  amLODIPine (NORVASC) 5 MG tablet Take 5 mg by mouth once daily.  aspirin 325 MG tablet Take 325 mg by mouth once daily  benazepril (LOTENSIN) 40 MG tablet Take 40 mg by mouth once daily.  calcium carbonate-vitamin D3 (OS-CAL 500+D) 500  mg-10 mcg (400 unit) tablet Take 1 tablet by mouth once daily.  docosahexaenoic acid/epa (FISH OIL ORAL) Take 1,000 mg by mouth once daily  ezetimibe (ZETIA) 10 mg tablet Take 10 mg by mouth once daily  fluticasone (FLONASE) 50 mcg/actuation nasal spray by Nasal route.  multivitamin tablet Take by mouth.  niacin (NIASPAN) 1000 MG ER tablet Take by mouth.  triamcinolone 0.1 % cream Apply topically 2 (two) times daily as needed.  estradioL (ESTRACE) 0.01 % (0.1 mg/gram) vaginal cream Insert fingertip size amount nightly x 2 weeks then every other night x 2 weeks then 2-3 times weekly for maintenance (Patient not taking: Reported on 02/25/2022) 42.5 g 2   No current facility-administered medications for this visit.   Family History  Problem Relation Age of Onset  Heart disease Mother  Diabetes Mother  Hearing loss Mother  Stroke Father  Heart disease Father  Alcohol abuse Father  Colon polyps Sister  Hearing loss Sister  Arthritis Sister  Hypothyroidism Sister  Breast cancer Sister  Hearing loss Brother  Alcohol abuse Brother  High blood pressure (Hypertension) Brother  Arthritis Brother  Breast cancer Cousin  Colon cancer Maternal Uncle  Colon polyps Son   Labs and Radiology:   January 12, 2022 laboratory:  Sodium 135 - 145 mEq/L 140  Potassium 3.5 - 5.1 mEq/L 3.6  Chloride 96 - 112 mEq/L 104  CO2 19 - 32 mEq/L 29  Glucose, Bld 70 - 99 mg/dL 94  BUN 6 - 23 mg/dL 10  Creatinine, Ser 0.40 - 1.20 mg/dL 0.64  GFR >60.00 mL/min 85.82  Comment: Calculated using the CKD-EPI Creatinine Equation (2021) Calcium 8.4 - 10.5 mg/dL 9.3  Total Bilirubin 0.2 - 1.2 mg/dL 0.6  Bilirubin, Direct 0.0 - 0.3 mg/dL 0.1  Alkaline Phosphatase 39 - 117 U/L 72  AST 0 - 37 U/L 24  ALT 0 - 35 U/L 18  Total Protein 6.0 - 8.3 g/dL 6.2  Albumin 3.5 - 5.2 g/dL 4.1  Hgb A1c MFr Bld 4.6 - 6.5 % 5.6   April 26, 2021 CBC: (At time of presentation with diverticulitis). WBC 4.0 - 10.5 K/uL 5.8  RBC  3.87 - 5.11 MIL/uL 3.99  Hemoglobin 12.0 - 15.0 g/dL 13.3  HCT 36.0 - 46.0 % 38.6  MCV 80.0 - 100.0 fL 96.7  MCH 26.0 - 34.0 pg 33.3  MCHC 30.0 - 36.0 g/dL 34.5  RDW 11.5 - 15.5 % 13.0  Platelets 150 - 400 K/uL 235  nRBC 0.0 - 0.2 % 0.0   April 26, 2021 CT review:  IMPRESSION: 1. Examination is positive for acute sigmoid diverticulitis. No signs of perforation or abscess. 2. Large umbilical hernia containing loop of a loop of non-dilated small bowel containing air-fluid levels. No signs of significant bowel obstruction. 3. Right lower lobe lung nodule measures 5 mm. No follow-up needed if patient is low-risk. Non-contrast chest CT can be considered in 12 months if patient is high-risk. This recommendation follows the consensus statement: Guidelines for Management of Incidental Pulmonary Nodules  Detected on CT Images: From the Fleischner Society 2017; Radiology 2017; [phone].  These films have been independently reviewed. Estimated fascial defect: 5 cm.  October 04, 2021 chest CT:  IMPRESSION: 4 mm right lower lobe pulmonary nodule, stable since prior study. No follow-up needed if patient is low-risk.This recommendation follows the consensus statement: Guidelines for Management of Incidental Pulmonary Nodules Detected on CT Images: From the Fleischner Society 2017; Radiology 2017; 284:228-243.  June 29, 2021 colonoscopy:  The digital rectal exam was normal. - A 4 mm polyp was found in the cecum. The polyp was sessile. The polyp was removed with a cold snare. Resection and retrieval were complete. - A 2 mm polyp was found in the ascending colon. The polyp was sessile. The polyp was removed with a cold snare. Resection and retrieval were complete. - Multiple small and large-mouthed diverticula were found in the sigmoid colon. - The retroflexed view of the distal rectum and anal verge was normal and showed no anal or rectal abnormalities.   Diagnosis Surgical  [P], colon, cecum, ascending, polyp (2) - TUBULAR ADENOMA(S) - NEGATIVE FOR HIGH-GRADE DYSPLASIA OR MALIGNANCY  Physical Exam Exam conducted with a chaperone present.  Constitutional:  Appearance: Normal appearance.  Cardiovascular:  Rate and Rhythm: Normal rate and regular rhythm.  Pulses: Normal pulses.  Heart sounds: Normal heart sounds.  Pulmonary:  Effort: Pulmonary effort is normal.  Breath sounds: Normal breath sounds.  Abdominal:  General: Bowel sounds are normal.  Palpations: Abdomen is soft.  Hernia: A hernia is present. Hernia is present in the ventral area.   Comments: Partially reducible ventral hernia at the umbilicus. Infraumbilical incision present.  Musculoskeletal:  Cervical back: Neck supple.  Skin: General: Skin is warm and dry.  Neurological:  Mental Status: She is alert and oriented to person, place, and time.  Psychiatric:  Mood and Affect: Mood normal.  Behavior: Behavior normal.    Assessment:   Ventral hernia, more symptomatic than in the past.  Plan:   At the time of her spring 2023 exam she is really having no symptoms. Now she reports the need to apply pressure to the area during defecation. Prior CT showed a loop of small bowel with adjacent omentum. Long history of diverticulosis and intermittent bowel habits.  The patient makes use of a probiotic. She has been asked to use MiraLAX 1 capful in 8 ounces of fluid daily to help eliminate high pressure with defecation.  Pros and cons of ventral hernia were reviewed. Role of prosthetic mesh for defect of this size was reviewed.  Would anticipate outpatient procedure. (The patient underwent a bladder tack December 2022 without apparent complication.)    This note is partially prepared by Karie Fetch, RN, acting as a scribe in the presence of Dr. Hervey Ard, MD.  The documentation recorded by the scribe accurately reflects the service I personally performed and the decisions made by me.    Robert Bellow, MD FACS  Electronically signed by Mayer Masker, MD at 02/25/2022 7:50 PM EDT

## 2022-03-25 ENCOUNTER — Other Ambulatory Visit: Payer: Self-pay

## 2022-03-25 ENCOUNTER — Encounter
Admission: RE | Admit: 2022-03-25 | Discharge: 2022-03-25 | Disposition: A | Discharge status: Home / Self Care | Payer: Medicare Other | Source: Ambulatory Visit | Attending: General Surgery | Admitting: General Surgery

## 2022-03-25 DIAGNOSIS — Z01812 Encounter for preprocedural laboratory examination: Secondary | ICD-10-CM

## 2022-03-25 HISTORY — DX: Unspecified osteoarthritis, unspecified site: M19.90

## 2022-03-25 HISTORY — DX: Family history of other specified conditions: Z84.89

## 2022-03-25 HISTORY — DX: Prediabetes: R73.03

## 2022-03-25 NOTE — Patient Instructions (Addendum)
Your procedure is scheduled on: 03/31/22 - Wednesday Report to the Registration Desk on the 1st floor of the [facility]. To find out your arrival time, please call [phone] between 1PM - 3PM on: 03/30/22 - Tuesday If your arrival time is 6:00 am, do not arrive prior to that time as the [facility] entrance doors do not open until 6:00 am. Report to [facility] for Labs and EKG on 03/29/22 at 11 am - [place], [place]  REMEMBER: Instructions that are not followed completely may result in serious medical risk, up to and including death; or upon the discretion of your surgeon and anesthesiologist your surgery may need to be rescheduled.  Do not eat food after midnight the night before surgery.  No gum chewing, lozengers or hard candies.  You may however, drink CLEAR liquids up to 2 hours before you are scheduled to arrive for your surgery. Do not drink anything within 2 hours of your scheduled arrival time.  Clear liquids include: - water  - apple juice without pulp - gatorade (not RED colors) - black coffee or tea (Do NOT add milk or creamers to the coffee or tea) Do NOT drink anything that is not on this list.  TAKE THESE MEDICATIONS THE MORNING OF SURGERY WITH A SIP OF WATER: NONE  One week prior to surgery: Stop Anti-inflammatories (NSAIDS) such as Advil, Aleve, Ibuprofen, Motrin, Naproxen, Naprosyn and Aspirin based products such as Excedrin, Goodys Powder, BC Powder.  Stop ANY OVER THE COUNTER supplements until after surgery calcium-vitamin D , fish oil-omega-3 fatty acids ,Multiple Vitamin , niacin .  You may however, continue to take Tylenol if needed for pain up until the day of surgery.  No Alcohol for 24 hours before or after surgery.  No Smoking including e-cigarettes for 24 hours prior to surgery.  No chewable tobacco products for at least 6 hours prior to surgery.  No nicotine patches on the day of surgery.  Do not use any  "recreational" drugs for at least a week prior to your surgery.  Please be advised that the combination of cocaine and anesthesia may have negative outcomes, up to and including death. If you test positive for cocaine, your surgery will be cancelled.  On the morning of surgery brush your teeth with toothpaste and water, you may rinse your mouth with mouthwash if you wish. Do not swallow any toothpaste or mouthwash.  Use CHG Soap or wipes as directed on instruction sheet.  Do not wear jewelry, make-up, hairpins, clips or nail polish.  Do not wear lotions, powders, or perfumes.   Do not shave body from the neck down 48 hours prior to surgery just in case you cut yourself which could leave a site for infection.  Also, freshly shaved skin may become irritated if using the CHG soap.  Contact lenses, hearing aids and dentures may not be worn into surgery.  Do not bring valuables to the hospital. [facility] is not responsible for any missing/lost belongings or valuables.   Notify your doctor if there is any change in your medical condition (cold, fever, infection).  Wear comfortable clothing (specific to your surgery type) to the hospital.  After surgery, you can help prevent lung complications by doing breathing exercises.  Take deep breaths and cough every 1-2 hours. Your doctor may order a device called an Incentive Spirometer to help you take deep breaths. When coughing or sneezing, hold a pillow firmly against your incision  with both hands. This is called "splinting." Doing this helps protect your incision. It also decreases belly discomfort.  If you are being admitted to the hospital overnight, leave your suitcase in the car. After surgery it may be brought to your room.  If you are being discharged the day of surgery, you will not be allowed to drive home. You will need a responsible adult (18 years or older) to drive you home and stay with you that night.   If you are taking  public transportation, you will need to have a responsible adult (18 years or older) with you. Please confirm with your physician that it is acceptable to use public transportation.   Please call the [facility] Dept. at [phone] if you have any questions about these instructions.  Surgery Visitation Policy:  Patients undergoing a surgery or procedure may have two family members or support persons with them as long as the person is not COVID-19 positive or experiencing its symptoms.   Inpatient Visitation:    Visiting hours are 7 a.m. to 8 p.m. Up to four visitors are allowed at one time in a patient room, including children. The visitors may rotate out with other people during the day. One designated support person (adult) may remain overnight.

## 2022-03-29 ENCOUNTER — Encounter: Payer: Self-pay | Admitting: Urgent Care

## 2022-03-29 ENCOUNTER — Encounter
Admission: RE | Admit: 2022-03-29 | Discharge: 2022-03-29 | Disposition: A | Discharge status: Home / Self Care | Payer: Medicare Other | Source: Ambulatory Visit | Attending: General Surgery | Admitting: General Surgery

## 2022-03-29 DIAGNOSIS — Z01818 Encounter for other preprocedural examination: Secondary | ICD-10-CM | POA: Insufficient documentation

## 2022-03-29 DIAGNOSIS — Z01812 Encounter for preprocedural laboratory examination: Secondary | ICD-10-CM

## 2022-03-29 LAB — CBC
HCT: 40.2 % (ref 36.0–46.0)
Hemoglobin: 13.2 g/dL (ref 12.0–15.0)
MCH: 29.7 pg (ref 26.0–34.0)
MCHC: 32.8 g/dL (ref 30.0–36.0)
MCV: 90.5 fL (ref 80.0–100.0)
Platelets: 228 10*3/uL (ref 150–400)
RBC: 4.44 MIL/uL (ref 3.87–5.11)
RDW: 13.6 % (ref 11.5–15.5)
WBC: 6.4 10*3/uL (ref 4.0–10.5)
nRBC: 0 % (ref 0.0–0.2)

## 2022-03-30 MED ORDER — CHLORHEXIDINE GLUCONATE CLOTH 2 % EX PADS: 6.0000 | MEDICATED_PAD | Freq: Once | CUTANEOUS | Status: DC

## 2022-03-30 MED ORDER — ORAL CARE MOUTH RINSE: 15.0000 mL | Freq: Once | OROMUCOSAL | Status: AC

## 2022-03-30 MED ORDER — CHLORHEXIDINE GLUCONATE CLOTH 2 % EX PADS
6.0000 | MEDICATED_PAD | Freq: Once | CUTANEOUS | Status: AC
  Administered 2022-03-31: 6 via TOPICAL

## 2022-03-30 MED ORDER — CEFAZOLIN SODIUM-DEXTROSE 2-4 GM/100ML-% IV SOLN
2.0000 g | INTRAVENOUS | Status: AC
  Administered 2022-03-31: 2 g via INTRAVENOUS

## 2022-03-30 MED ORDER — FAMOTIDINE 20 MG PO TABS: 20.0000 mg | ORAL_TABLET | Freq: Once | ORAL | Status: AC

## 2022-03-30 MED ORDER — LACTATED RINGERS IV SOLN: INTRAVENOUS | Status: DC

## 2022-03-30 MED ORDER — CHLORHEXIDINE GLUCONATE 0.12 % MT SOLN: 15.0000 mL | Freq: Once | OROMUCOSAL | Status: AC

## 2022-03-31 ENCOUNTER — Other Ambulatory Visit: Payer: Self-pay

## 2022-03-31 ENCOUNTER — Ambulatory Visit: Payer: Medicare Other | Admitting: General Practice

## 2022-03-31 ENCOUNTER — Ambulatory Visit: Payer: Medicare Other

## 2022-03-31 ENCOUNTER — Ambulatory Visit
Admission: RE | Admit: 2022-03-31 | Discharge: 2022-03-31 | Disposition: A | Discharge status: Home / Self Care | Payer: Medicare Other | Attending: General Surgery | Admitting: General Surgery

## 2022-03-31 ENCOUNTER — Encounter
Admission: RE | Disposition: A | Discharge status: Home / Self Care | Payer: Self-pay | Source: Home / Self Care | Attending: General Surgery

## 2022-03-31 ENCOUNTER — Encounter: Payer: Self-pay | Admitting: General Surgery

## 2022-03-31 DIAGNOSIS — K589 Irritable bowel syndrome without diarrhea: Secondary | ICD-10-CM | POA: Insufficient documentation

## 2022-03-31 DIAGNOSIS — K219 Gastro-esophageal reflux disease without esophagitis: Secondary | ICD-10-CM | POA: Insufficient documentation

## 2022-03-31 DIAGNOSIS — I10 Essential (primary) hypertension: Secondary | ICD-10-CM | POA: Insufficient documentation

## 2022-03-31 DIAGNOSIS — K432 Incisional hernia without obstruction or gangrene: Secondary | ICD-10-CM | POA: Diagnosis present

## 2022-03-31 DIAGNOSIS — Z853 Personal history of malignant neoplasm of breast: Secondary | ICD-10-CM | POA: Diagnosis not present

## 2022-03-31 DIAGNOSIS — R7303 Prediabetes: Secondary | ICD-10-CM | POA: Diagnosis not present

## 2022-03-31 HISTORY — PX: VENTRAL HERNIA REPAIR: SHX424

## 2022-03-31 HISTORY — PX: INSERTION OF MESH: SHX5868

## 2022-03-31 SURGERY — REPAIR, HERNIA, VENTRAL
Anesthesia: General | Site: Abdomen

## 2022-03-31 MED ORDER — ROCURONIUM BROMIDE 100 MG/10ML IV SOLN
INTRAVENOUS | Status: DC | PRN
  Administered 2022-03-31: 25 mg via INTRAVENOUS
  Administered 2022-03-31: 10 mg via INTRAVENOUS
  Administered 2022-03-31: 15 mg via INTRAVENOUS

## 2022-03-31 MED ORDER — DEXAMETHASONE SODIUM PHOSPHATE 10 MG/ML IJ SOLN
INTRAMUSCULAR | Status: DC | PRN
  Administered 2022-03-31: 5 mg via INTRAVENOUS

## 2022-03-31 MED ORDER — ONDANSETRON HCL 4 MG/2ML IJ SOLN
INTRAMUSCULAR | Status: DC | PRN
  Administered 2022-03-31: 4 mg via INTRAVENOUS

## 2022-03-31 MED ORDER — CEFAZOLIN SODIUM-DEXTROSE 2-4 GM/100ML-% IV SOLN
INTRAVENOUS | Status: AC
  Filled 2022-03-31: qty 100

## 2022-03-31 MED ORDER — FENTANYL CITRATE (PF) 100 MCG/2ML IJ SOLN: 25.0000 ug | INTRAMUSCULAR | Status: DC | PRN

## 2022-03-31 MED ORDER — OXYCODONE HCL 5 MG PO TABS
ORAL_TABLET | ORAL | Status: AC
  Filled 2022-03-31: qty 1

## 2022-03-31 MED ORDER — BUPIVACAINE HCL (PF) 0.5 % IJ SOLN
INTRAMUSCULAR | Status: AC
  Filled 2022-03-31: qty 30

## 2022-03-31 MED ORDER — ROCURONIUM BROMIDE 10 MG/ML (PF) SYRINGE
PREFILLED_SYRINGE | INTRAVENOUS | Status: AC
  Filled 2022-03-31: qty 10

## 2022-03-31 MED ORDER — BUPIVACAINE LIPOSOME 1.3 % IJ SUSP
INTRAMUSCULAR | Status: AC
  Filled 2022-03-31: qty 20

## 2022-03-31 MED ORDER — SUCCINYLCHOLINE CHLORIDE 200 MG/10ML IV SOSY
PREFILLED_SYRINGE | INTRAVENOUS | Status: DC | PRN
  Administered 2022-03-31: 100 mg via INTRAVENOUS

## 2022-03-31 MED ORDER — KETAMINE HCL 50 MG/5ML IJ SOSY
PREFILLED_SYRINGE | INTRAMUSCULAR | Status: AC
  Filled 2022-03-31: qty 5

## 2022-03-31 MED ORDER — OXYCODONE HCL 5 MG PO TABS: 5.0000 mg | ORAL_TABLET | ORAL | 0 refills | Status: DC | PRN

## 2022-03-31 MED ORDER — BUPIVACAINE HCL (PF) 0.5 % IJ SOLN
INTRAMUSCULAR | Status: DC | PRN
  Administered 2022-03-31 (×2): 15 mL

## 2022-03-31 MED ORDER — ONDANSETRON 4 MG PO TBDP: 4.0000 mg | ORAL_TABLET | ORAL | 0 refills | Status: DC | PRN

## 2022-03-31 MED ORDER — FENTANYL CITRATE (PF) 100 MCG/2ML IJ SOLN
INTRAMUSCULAR | Status: AC
  Filled 2022-03-31: qty 2

## 2022-03-31 MED ORDER — LIDOCAINE HCL (CARDIAC) PF 100 MG/5ML IV SOSY
PREFILLED_SYRINGE | INTRAVENOUS | Status: DC | PRN
  Administered 2022-03-31: 80 mg via INTRAVENOUS

## 2022-03-31 MED ORDER — LIDOCAINE HCL (PF) 2 % IJ SOLN
INTRAMUSCULAR | Status: AC
  Filled 2022-03-31: qty 5

## 2022-03-31 MED ORDER — OXYCODONE HCL 5 MG/5ML PO SOLN: 5.0000 mg | Freq: Once | ORAL | Status: AC | PRN

## 2022-03-31 MED ORDER — FENTANYL CITRATE (PF) 100 MCG/2ML IJ SOLN
INTRAMUSCULAR | Status: DC | PRN
Start: 2022-03-31 — End: 2022-03-31
  Administered 2022-03-31: 25 ug via INTRAVENOUS
  Administered 2022-03-31: 50 ug via INTRAVENOUS
  Administered 2022-03-31: 25 ug via INTRAVENOUS

## 2022-03-31 MED ORDER — 0.9 % SODIUM CHLORIDE (POUR BTL) OPTIME
TOPICAL | Status: DC | PRN
  Administered 2022-03-31: 500 mL

## 2022-03-31 MED ORDER — OXYCODONE HCL 5 MG PO TABS
5.0000 mg | ORAL_TABLET | Freq: Once | ORAL | Status: AC | PRN
  Administered 2022-03-31: 5 mg via ORAL

## 2022-03-31 MED ORDER — CHLORHEXIDINE GLUCONATE 0.12 % MT SOLN
OROMUCOSAL | Status: AC
  Administered 2022-03-31: 15 mL via OROMUCOSAL
  Filled 2022-03-31: qty 15

## 2022-03-31 MED ORDER — SUGAMMADEX SODIUM 200 MG/2ML IV SOLN
INTRAVENOUS | Status: DC | PRN
  Administered 2022-03-31: 180 mg via INTRAVENOUS

## 2022-03-31 MED ORDER — ACETAMINOPHEN 10 MG/ML IV SOLN
INTRAVENOUS | Status: AC
  Filled 2022-03-31: qty 100

## 2022-03-31 MED ORDER — ONDANSETRON HCL 4 MG/2ML IJ SOLN
INTRAMUSCULAR | Status: AC
  Filled 2022-03-31: qty 2

## 2022-03-31 MED ORDER — PROPOFOL 10 MG/ML IV BOLUS
INTRAVENOUS | Status: AC
  Filled 2022-03-31: qty 20

## 2022-03-31 MED ORDER — SUCCINYLCHOLINE CHLORIDE 200 MG/10ML IV SOSY
PREFILLED_SYRINGE | INTRAVENOUS | Status: AC
  Filled 2022-03-31: qty 10

## 2022-03-31 MED ORDER — BUPIVACAINE-EPINEPHRINE (PF) 0.5% -1:200000 IJ SOLN
INTRAMUSCULAR | Status: AC
  Filled 2022-03-31: qty 30

## 2022-03-31 MED ORDER — PROPOFOL 10 MG/ML IV BOLUS
INTRAVENOUS | Status: DC | PRN
  Administered 2022-03-31: 120 mg via INTRAVENOUS

## 2022-03-31 MED ORDER — KETAMINE HCL 10 MG/ML IJ SOLN
INTRAMUSCULAR | Status: DC | PRN
  Administered 2022-03-31: 30 mg via INTRAVENOUS
  Administered 2022-03-31: 20 mg via INTRAVENOUS

## 2022-03-31 MED ORDER — FAMOTIDINE 20 MG PO TABS
ORAL_TABLET | ORAL | Status: AC
  Administered 2022-03-31: 20 mg via ORAL
  Filled 2022-03-31: qty 1

## 2022-03-31 MED ORDER — TRAMADOL HCL 50 MG PO TABS: 50.0000 mg | ORAL_TABLET | ORAL | 0 refills | Status: DC | PRN

## 2022-03-31 MED ORDER — BUPIVACAINE LIPOSOME 1.3 % IJ SUSP
INTRAMUSCULAR | Status: DC | PRN
  Administered 2022-03-31 (×2): 10 mL

## 2022-03-31 MED ORDER — ACETAMINOPHEN 10 MG/ML IV SOLN
INTRAVENOUS | Status: DC | PRN
  Administered 2022-03-31: 1000 mg via INTRAVENOUS

## 2022-03-31 SURGICAL SUPPLY — 44 items
BENZOIN TINCTURE PRP APPL 2/3 (GAUZE/BANDAGES/DRESSINGS) ×1 IMPLANT
BINDER ABDOMINAL 12 ML 46-62 (SOFTGOODS) IMPLANT
BLADE SURG 15 STRL SS SAFETY (BLADE) ×1 IMPLANT
BULB RESERV EVAC DRAIN JP 100C (MISCELLANEOUS) IMPLANT
CHLORAPREP W/TINT 26 (MISCELLANEOUS) ×1 IMPLANT
DRAIN CHANNEL JP 15F RND 16 (MISCELLANEOUS) IMPLANT
DRAPE LAPAROTOMY TRNSV 106X77 (MISCELLANEOUS) ×1 IMPLANT
DRSG OPSITE POSTOP 4X6 (GAUZE/BANDAGES/DRESSINGS) ×1 IMPLANT
DRSG OPSITE POSTOP 4X8 (GAUZE/BANDAGES/DRESSINGS) IMPLANT
ELECT REM PT RETURN 9FT ADLT (ELECTROSURGICAL) ×1
ELECTRODE REM PT RTRN 9FT ADLT (ELECTROSURGICAL) ×1 IMPLANT
GAUZE 4X4 16PLY ~~LOC~~+RFID DBL (SPONGE) ×1 IMPLANT
GLOVE BIO SURGEON STRL SZ7.5 (GLOVE) ×1 IMPLANT
GLOVE SURG UNDER LTX SZ8 (GLOVE) ×1 IMPLANT
GOWN STRL REUS W/ TWL LRG LVL3 (GOWN DISPOSABLE) ×2 IMPLANT
GOWN STRL REUS W/TWL LRG LVL3 (GOWN DISPOSABLE) ×4
KIT TURNOVER KIT A (KITS) ×1 IMPLANT
LABEL OR SOLS (LABEL) ×1 IMPLANT
MANIFOLD NEPTUNE II (INSTRUMENTS) ×1 IMPLANT
MESH BARD SOFT 6X6IN (Mesh General) IMPLANT
NDL HYPO 25X1 1.5 SAFETY (NEEDLE) ×1 IMPLANT
NEEDLE HYPO 22GX1.5 SAFETY (NEEDLE) ×1 IMPLANT
NEEDLE HYPO 25X1 1.5 SAFETY (NEEDLE) ×1 IMPLANT
NS IRRIG 500ML POUR BTL (IV SOLUTION) ×1 IMPLANT
PACK BASIN MINOR ARMC (MISCELLANEOUS) ×1 IMPLANT
RETAINER VISCERA MED (MISCELLANEOUS) IMPLANT
SPONGE T-LAP 18X18 ~~LOC~~+RFID (SPONGE) ×1 IMPLANT
STAPLER SKIN PROX 35W (STAPLE) IMPLANT
STRIP CLOSURE SKIN 1/2X4 (GAUZE/BANDAGES/DRESSINGS) ×1 IMPLANT
SUT MAXON ABS #0 GS21 30IN (SUTURE) IMPLANT
SUT SURGILON 0 BLK (SUTURE) ×1 IMPLANT
SUT VIC AB 0 CT1 36 (SUTURE) ×1 IMPLANT
SUT VIC AB 2-0 BRD 54 (SUTURE) ×1 IMPLANT
SUT VIC AB 2-0 CT1 (SUTURE) IMPLANT
SUT VIC AB 3-0 54X BRD REEL (SUTURE) ×1 IMPLANT
SUT VIC AB 3-0 BRD 54 (SUTURE) ×1
SUT VIC AB 3-0 SH 27 (SUTURE) ×1
SUT VIC AB 3-0 SH 27X BRD (SUTURE) ×1 IMPLANT
SUT VIC AB 4-0 FS2 27 (SUTURE) ×1 IMPLANT
SYR 10ML LL (SYRINGE) ×1 IMPLANT
SYR 3ML LL SCALE MARK (SYRINGE) ×1 IMPLANT
TRAP FLUID SMOKE EVACUATOR (MISCELLANEOUS) ×1 IMPLANT
TRAY FOLEY MTR SLVR 16FR STAT (SET/KITS/TRAYS/PACK) IMPLANT
WATER STERILE IRR 500ML POUR (IV SOLUTION) ×1 IMPLANT

## 2022-03-31 NOTE — Anesthesia Procedure Notes (Signed)
Anesthesia Regional Block: TAP block   Pre-Anesthetic Checklist: , timeout performed,  Correct Patient, Correct Site, Correct Laterality,  Correct Procedure, Correct Position, site marked,  Risks and benefits discussed,  Surgical consent,  Pre-op evaluation,  At surgeon's request and post-op pain management  Laterality: Left  Prep: chloraprep       Needles:  Injection technique: Single-shot  Needle Type: Echogenic Needle     Needle Length: 9cm  Needle Gauge: 20     Additional Needles:   Procedures:,,,, ultrasound used (permanent image in chart),,    Narrative:  Start time: 03/31/2022 1:55 PM End time: 03/31/2022 2:02 PM Injection made incrementally with aspirations every 5 mL.  Performed by: Personally  Anesthesiologist: Dimas Millin, MD  Additional Notes: Patient's chart reviewed and they were deemed appropriate candidate for procedure, per surgeon's request. Patient educated about risks, benefits, and alternatives of the block including but not limited to: temporary or permanent nerve damage, bleeding, infection, damage to surround tissues, block failure, local anesthetic toxicity. Patient expressed understanding. A formal time-out was conducted consistent with institution rules.  Monitors were applied, and minimal sedation used (see nursing record). The site was prepped with skin prep and allowed to dry, and sterile gloves were used. A high frequency linear ultrasound probe with probe cover was utilized throughout. Femoral artery visualized at mid-thigh level, local anesthetic injected anterolateral to it, and echogenic block needle trajectory was monitored throughout. Hydrodissection of saphenous nerve visualized and appeared anatomically normal. Aspiration performed every 67m. Blood vessels were avoided. All injections were performed without resistance and free of blood and paresthesias. The patient tolerated the procedure well.  Injectate: 266mof exparel and 3054mf 0.5%  bupivacaine

## 2022-03-31 NOTE — Transfer of Care (Signed)
Immediate Anesthesia Transfer of Care Note  Patient: Helen Marshall  Procedure(s) Performed: HERNIA REPAIR VENTRAL ADULT (Abdomen) INSERTION OF MESH (Abdomen)  Patient Location: PACU  Anesthesia Type:General  Level of Consciousness: awake and alert   Airway & Oxygen Therapy: Patient Spontanous Breathing and Patient connected to face mask oxygen  Post-op Assessment: Report given to RN and Post -op Vital signs reviewed and stable  Post vital signs: Reviewed and stable  Last Vitals:  Vitals Value Taken Time  BP 131/70 03/31/22 1408  Temp    Pulse 66 03/31/22 1412  Resp 22 03/31/22 1411  SpO2 96 % 03/31/22 1412  Vitals shown include unvalidated device data.  Last Pain:  Vitals:   03/31/22 1104  TempSrc: Temporal  PainSc: 0-No pain         Complications: No notable events documented.

## 2022-03-31 NOTE — Progress Notes (Signed)
Patient awake/alert x4. Abd in place, dressing intact. Patient received TAPP block. Tolerated gingerale and crackers without event.

## 2022-03-31 NOTE — Op Note (Signed)
Preoperative diagnosis: Recurrent ventral hernia.  Postoperative diagnosis: Same.  Operative procedure: Lysis of adhesions, repair of ventral hernia with retrorectus Bard soft mesh.  Operating surgeon: Hervey Ard, MD.  Assistant: Elsie Stain, RNFA.  Anesthesia: General endotracheal, TAPP block.  Estimated blood loss: 10 cc.  Clinical note: This 76 year old woman underwent a primary repair of an umbilical hernia in the distant past.  She has developed a recurrent ventral hernia 5 cm in diameter based on CT imaging with tethered bowel within the lumen.  No obstructive symptoms she is brought to the operating for planned elective repair.  She had SCD stockings for DVT prevention.  She received Ancef intravenously on induction of anesthesia.  Operative note: With the patient under adequate general endotracheal anesthesia the abdomen was cleansed with ChloraPrep and draped.  A periumbilical incision was made vertically orientated.  Skin was incised sharply remaining dissection completed with electrocautery.  The sac was mobilized circumferentially and transected at the fascial level.  Adhesions of the bowel to within the sac were divided carefully.  No evidence of intestinal injury or ischemia.  The fascial defect was 5 cm in diameter.  It was elected to place a retrorectus mesh.  The peritoneum and posterior rectus sheath was separated from the midline fascia bilaterally.  This was closed with interrupted 2-0 Vicryl figure-of-eight sutures without tension.  A pocket was made behind the rectus muscle for a distance of about 7 cm on each side and about 4 cm cephalad and caudad.  A 5 x 5 inch piece of Bard soft mesh was placed into the retrorectus space and anchored with transfascial sutures of 0 Maxon that were placed through the periphery of the mesh and in the midline.  The midline fascia was closed with interrupted 0 Maxon figure-of-eight sutures with a small bite of the mesh included within the  closure.  The adipose tissue was closed with interrupted 2-0 Vicryl figure-of-eight sutures with good obliteration of the dead space the superficial adipose layer was approximated with interrupted 2-0 Vicryl sutures.  The skin was closed with staples.  A honeycomb dressing was applied.  Anesthesia completed a TAPP block at the end of the procedure and she tolerated this well.  She was taken to the PACU in stable condition.

## 2022-03-31 NOTE — Anesthesia Preprocedure Evaluation (Signed)
Anesthesia Evaluation  Patient identified by MRN, date of birth, ID band Patient awake    Reviewed: Allergy & Precautions, NPO status , Patient's Chart, lab work & pertinent test results  Airway Mallampati: III  TM Distance: >3 FB Neck ROM: full    Dental  (+) Dental Advidsory Given, Poor Dentition   Pulmonary neg pulmonary ROS,    Pulmonary exam normal        Cardiovascular hypertension, negative cardio ROS Normal cardiovascular exam     Neuro/Psych negative neurological ROS  negative psych ROS   GI/Hepatic Neg liver ROS, GERD  ,  Endo/Other  negative endocrine ROS  Renal/GU      Musculoskeletal   Abdominal   Peds  Hematology negative hematology ROS (+)   Anesthesia Other Findings Past Medical History: No date: Abnormal LFTs 12/19/2012: Abnormal mammogram     Comment:  left No date: Allergy No date: Anemia No date: Arthritis 2014: Cancer ([place])     Comment:  left Breast No date: Carotid bruit     Comment:  left No date: Cataract No date: Colon polyps 2012: Diverticulosis No date: Environmental allergies No date: Family history of adverse reaction to anesthesia     Comment:  her aunt was hard to wake up No date: GERD (gastroesophageal reflux disease) No date: Heart murmur 05/2021: History of diverticulitis No date: Hypercholesterolemia No date: Hyperglycemia No date: Hypertension No date: IBS (irritable bowel syndrome) No date: Lipoma of colon No date: Lumbar disc disease 01/22/2013: Malignant neoplasm of upper-outer quadrant of female  breast ([place])     Comment:  DCIS, ER 90%, PR 90%. Grade 1 Wide local excision,               sentinel node biopsy, MammoSite partial left breast               radiation, 5 years treatement with Tamoxifen completed               December 2019.Marland Kitchen No date: Pre-diabetes No date: Tubular adenoma of colon 41/3244: Umbilical hernia  Past Surgical History: 1989:  ABDOMINAL HYSTERECTOMY     Comment:  fibroids 1999: ANKLE SURGERY; Left 07/20/2021: ANTERIOR AND POSTERIOR REPAIR WITH SACROSPINOUS FIXATION;  N/A     Comment:  Procedure: ANTERIOR AND REPAIR WITH SACROSPINOUS               FIXATION;  Surgeon: Jaquita Folds, MD;  Location:              [facility];  Service: Gynecology;                Laterality: N/A; 1970: BREAST SURGERY; Left     Comment:  biopsy June & July 2017: CATARACT EXTRACTION, BILATERAL 2001: CHOLECYSTECTOMY 06/02/2011: COLONOSCOPY     Comment:  Verdie Shire, MD; diverticulosis, submucosal lipoma of the               sigmoid colon. 07/20/2021: CYSTOSCOPY; N/A     Comment:  Procedure: CYSTOSCOPY;  Surgeon: Jaquita Folds,               MD;  Location: [facility];  Service:               Gynecology;  Laterality: N/A; No date: GALLBLADDER SURGERY 2001, 2004: HERNIA REPAIR 01/22/2013: HERNIA REPAIR     Comment:  Repeat repair of umbilical defect, 2.5 cm. Primary  repair. No date: [place]: NASAL SINUS SURGERY 1998: rotator cuff surgery 05/2013: SQUAMOUS CELL CARCINOMA EXCISION; Right     Comment:  shoulder 1976: TUBAL LIGATION 08/22/2014: UPPER GI ENDOSCOPY     Comment:  Dr Billie Lade     Reproductive/Obstetrics negative OB ROS                             Anesthesia Physical Anesthesia Plan  ASA: 3  Anesthesia Plan: General ETT   Post-op Pain Management:    Induction: Intravenous  PONV Risk Score and Plan: 4 or greater and Ondansetron and Dexamethasone  Airway Management Planned: Oral ETT  Additional Equipment:   Intra-op Plan:   Post-operative Plan: Extubation in OR  Informed Consent: I have reviewed the patients History and Physical, chart, labs and discussed the procedure including the risks, benefits and alternatives for the proposed anesthesia with the patient or authorized representative who has  indicated his/her understanding and acceptance.     Dental Advisory Given  Plan Discussed with: Anesthesiologist, CRNA and Surgeon  Anesthesia Plan Comments: (Patient consented for risks of anesthesia including but not limited to:  - adverse reactions to medications - damage to eyes, teeth, lips or other oral mucosa - nerve damage due to positioning  - sore throat or hoarseness - Damage to heart, brain, nerves, lungs, other parts of body or loss of life  Patient voiced understanding.)        Anesthesia Quick Evaluation

## 2022-03-31 NOTE — Discharge Instructions (Signed)

## 2022-03-31 NOTE — Anesthesia Procedure Notes (Addendum)
Procedure Name: Intubation Date/Time: 03/31/2022 12:02 PM  Performed by: Fredderick Phenix, CRNAPre-anesthesia Checklist: Patient identified, Emergency Drugs available, Suction available and Patient being monitored Patient Re-evaluated:Patient Re-evaluated prior to induction Oxygen Delivery Method: Circle system utilized Preoxygenation: Pre-oxygenation with 100% oxygen Induction Type: IV induction Ventilation: Mask ventilation without difficulty Laryngoscope Size: McGraph and 3 Grade View: Grade I Tube type: Oral Tube size: 7.0 mm Number of attempts: 1 Airway Equipment and Method: Stylet and Oral airway Placement Confirmation: ETT inserted through vocal cords under direct vision, positive ETCO2 and breath sounds checked- equal and bilateral Secured at: 21 cm Tube secured with: Tape Dental Injury: Teeth and Oropharynx as per pre-operative assessment

## 2022-03-31 NOTE — H&P (Signed)
Helen Marshall [id number] 1945-09-05     HPI:  76 y/o with recurrent ventral hernia. For mesh repair.   Medications Prior to Admission  Medication Sig Dispense Refill Last Dose   amLODipine (NORVASC) 5 MG tablet TAKE ONE TABLET BY MOUTH EVERY DAY 90 tablet 3 03/30/2022   benazepril (LOTENSIN) 40 MG tablet TAKE 1 TABLET BY MOUTH DAILY 90 tablet 3 03/30/2022   calcium-vitamin D (OSCAL-500) 500-400 MG-UNIT tablet Take 1 tablet by mouth daily.    Past Week   ezetimibe (ZETIA) 10 MG tablet TAKE 1 TABLET BY MOUTH DAILY 90 tablet 1 03/30/2022   fish oil-omega-3 fatty acids 1000 MG capsule QID.   Past Week   fluticasone (FLONASE) 50 MCG/ACT nasal spray Place 2 sprays into both nostrils as needed. 48 g 3 03/30/2022   ibuprofen (ADVIL) 600 MG tablet Take 1 tablet (600 mg total) by mouth every 6 (six) hours as needed. 30 tablet 0 Past Week   Multiple Vitamin (MULTIVITAMIN) tablet Take 1 tablet by mouth daily.   Past Week   niacin (NIASPAN) 1000 MG CR tablet Take 1,000 mg by mouth 2 (two) times daily.   Past Week   Probiotic Product (VSL#3 PO) Take by mouth. 1 capsul daily   Past Week   psyllium (METAMUCIL) 58.6 % powder Take 1 packet by mouth daily.   03/30/2022   triamcinolone cream (KENALOG) 0.1 % Apply 1 application topically as needed.    Past Week   Allergies  Allergen Reactions   Crestor [Rosuvastatin] Other (See Comments)    Leg cramping    Demerol  [Meperidine Hcl]    Demerol [Meperidine] Nausea And Vomiting   Gabapentin    Latex Other (See Comments)    Redness, hands breakout   Lipitor [Atorvastatin] Other (See Comments)    Intolerant    Lovastatin Other (See Comments)    Elevated CK   Pravastatin Other (See Comments)    intolerant   Tramadol Nausea Only   Vicodin [Hydrocodone-Acetaminophen] Other (See Comments)    itching   Zocor [Simvastatin] Other (See Comments)    Intolerant    Past Medical History:  Diagnosis Date   Abnormal LFTs    Abnormal mammogram 12/19/2012   left    Allergy    Anemia    Arthritis    Cancer ([place]) 2014   left Breast   Carotid bruit    left   Cataract    Colon polyps    Diverticulosis 2012   Environmental allergies    Family history of adverse reaction to anesthesia    her aunt was hard to wake up   GERD (gastroesophageal reflux disease)    Heart murmur    History of diverticulitis 05/2021   Hypercholesterolemia    Hyperglycemia    Hypertension    IBS (irritable bowel syndrome)    Lipoma of colon    Lumbar disc disease    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   Pre-diabetes    Tubular adenoma of colon    Umbilical hernia 18/2993   Past Surgical History:  Procedure Laterality Date   ABDOMINAL HYSTERECTOMY  1989   fibroids   ANKLE SURGERY Left 1999   ANTERIOR AND POSTERIOR REPAIR WITH SACROSPINOUS FIXATION N/A 07/20/2021   Procedure: ANTERIOR AND REPAIR WITH SACROSPINOUS FIXATION;  Surgeon:  Jaquita Folds, MD;  Location: [facility];  Service: Gynecology;  Laterality: N/A;   BREAST SURGERY Left 1970   biopsy   CATARACT EXTRACTION, BILATERAL  June & July 2017   CHOLECYSTECTOMY  2001   COLONOSCOPY  06/02/2011   Verdie Shire, MD; diverticulosis, submucosal lipoma of the sigmoid colon.   CYSTOSCOPY N/A 07/20/2021   Procedure: CYSTOSCOPY;  Surgeon: Jaquita Folds, MD;  Location: [facility];  Service: Gynecology;  Laterality: N/A;   GALLBLADDER SURGERY     HERNIA REPAIR  2001, 2004   HERNIA REPAIR  01/22/2013   Repeat repair of umbilical defect, 2.5 cm. Primary repair.   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   shoulder   TUBAL LIGATION  1976   UPPER GI ENDOSCOPY   08/22/2014   Dr Billie Lade   Social History   Socioeconomic History   Marital status: Married    Spouse name: ,Nathaneil Canary   Number of children: 2   Years of education: Not on file   Highest education level: Not on file  Occupational History   Occupation: Retired  Tobacco Use   Smoking status: Never   Smokeless tobacco: Never  Vaping Use   Vaping Use: Never used  Substance and Sexual Activity   Alcohol use: No    Alcohol/week: 0.0 standard drinks of alcohol   Drug use: No   Sexual activity: Not Currently  Other Topics Concern   Not on file  Social History Narrative   Not on file   Social Determinants of Health   Financial Resource Strain: Low Risk  (01/04/2022)   Overall Financial Resource Strain (CARDIA)    Difficulty of Paying Living Expenses: Not hard at all  Food Insecurity: No Food Insecurity (01/04/2022)   Hunger Vital Sign    Worried About Running Out of Food in the Last Year: Never true    [place] in the Last Year: Never true  Transportation Needs: No Transportation Needs (01/04/2022)   PRAPARE - Hydrologist (Medical): No    Lack of Transportation (Non-Medical): No  Physical Activity: Unknown (08/10/2018)   Exercise Vital Sign    Days of Exercise per Week: 0 days    Minutes of Exercise per Session: Not on file  Stress: No Stress Concern Present (01/04/2022)   [place]    Feeling of Stress : Not at all  Social Connections: Unknown (01/04/2022)   Social Connection and Isolation Panel [NHANES]    Frequency of Communication with Friends and Family: Once a week    Frequency of Social Gatherings with Friends and Family: Once a week    Attends Religious Services: Not on Diplomatic Services operational officer of Clubs or Organizations: Not on file    Attends Archivist Meetings: Not on file    Marital Status: Married  Intimate Partner Violence: Not At Risk (01/04/2022)    Humiliation, Afraid, Rape, and Kick questionnaire    Fear of Current or Ex-Partner: No    Emotionally Abused: No    Physically Abused: No    Sexually Abused: No   Social History   Social History Narrative   Not on file     ROS: Negative.     PE: HEENT: Negative. Lungs: Clear. Cardio: RR.    Assessment/Plan:  Proceed with planned ventral hernia repair with mesh reinforcement.  Forest Gleason Karalynn Cottone 03/31/2022

## 2022-04-01 ENCOUNTER — Encounter: Payer: Self-pay | Admitting: General Surgery

## 2022-04-01 NOTE — Anesthesia Postprocedure Evaluation (Signed)
Anesthesia Post Note  Patient: Helen Marshall  Procedure(s) Performed: HERNIA REPAIR VENTRAL ADULT (Abdomen) INSERTION OF MESH (Abdomen)  Patient location during evaluation: PACU Anesthesia Type: General Level of consciousness: awake and alert Pain management: pain level controlled Vital Signs Assessment: post-procedure vital signs reviewed and stable Respiratory status: spontaneous breathing, nonlabored ventilation, respiratory function stable and patient connected to nasal cannula oxygen Cardiovascular status: blood pressure returned to baseline and stable Postop Assessment: no apparent nausea or vomiting Anesthetic complications: no   No notable events documented.   Last Vitals:  Vitals:   03/31/22 1508 03/31/22 1545  BP: (!) 162/73 (!) 154/76  Pulse: 71 74  Resp:  14  Temp:  36.4 C  SpO2: 96% 98%    Last Pain:  Vitals:   03/31/22 1545  TempSrc: Temporal  PainSc: [place]

## 2022-04-09 IMAGING — MR MR LUMBAR SPINE W/O CM
5 series · 30 of 48 positions shown · non-contrast
Comparison: Lumbar radiographs September 28, 2021 without report.

CLINICAL DATA: Neurogenic claudication (HCC) S2B.S2 (GMV-X1-CM)

Tech note: Chronic LBP x12 years, prog worse over time, NKI.
Recently pain started to radiate down her LT leg and her RT hip x6
months, NKI.
EXAM:
MRI LUMBAR SPINE WITHOUT CONTRAST
TECHNIQUE: Multiplanar, multisequence MR imaging of the lumbar spine was
performed. No intravenous contrast was administered.

[Series 5: T2 · sagittal · 4.0mm · 0.81mm/px · 6 of 17 slices shown (1 of 2)]
[im 1/17]
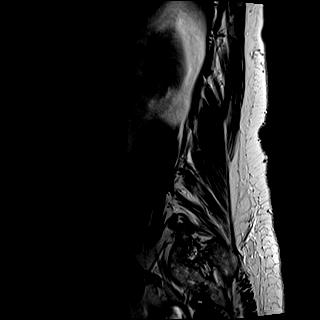
[im 4/17]
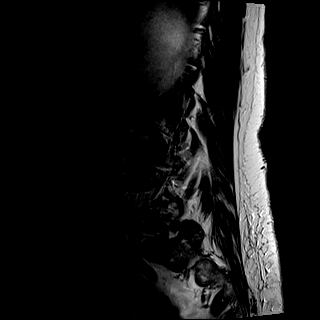
[im 7/17]
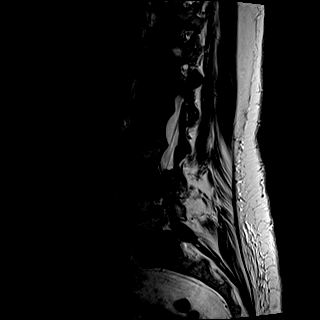
[im 10/17]
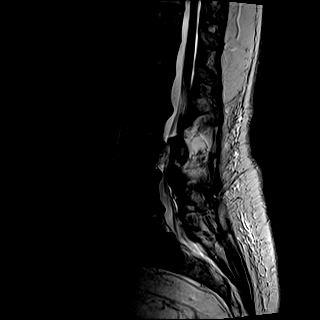
[im 13/17]
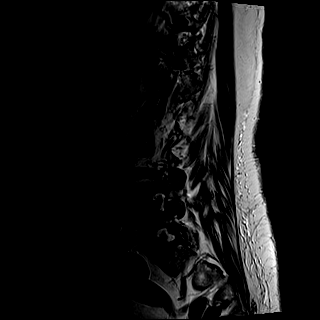
[im 17/17]
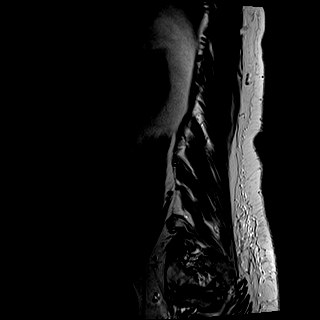

[Series 6: T1 · sagittal · 4.0mm · 0.81mm/px · 7 of 17 slices shown (1 of 2)]
[im 1/17]
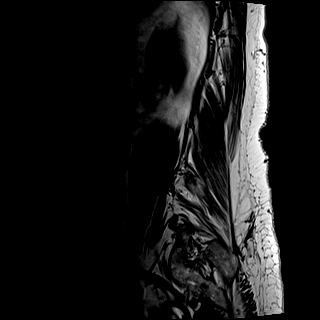
[im 3/17]
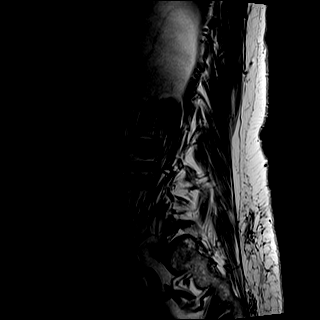
[im 6/17]
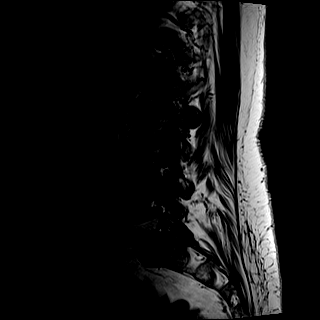
[im 9/17]
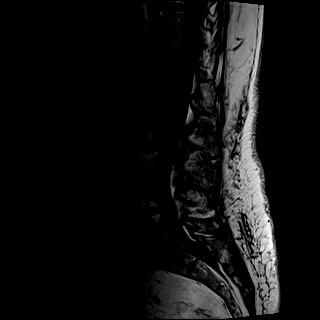
[im 11/17]
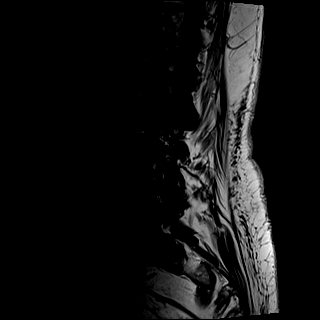
[im 14/17]
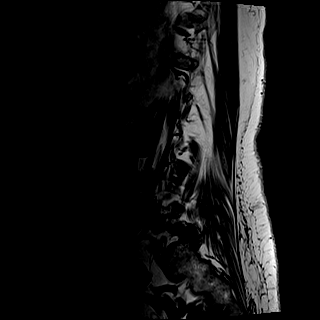
[im 17/17]
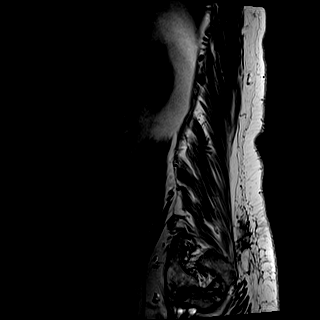

[Series 7: STIR · sagittal · 4.0mm · 0.41mm/px · 1 of 17 slices shown]
[im 1/17]
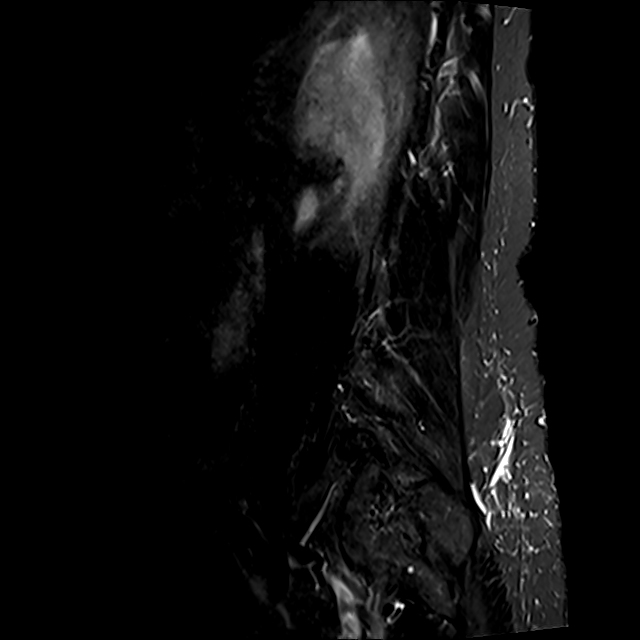

[Series 8: T2 · axial · 4.0mm · 0.78mm/px · z∈[-142,+54]mm · 8 of 33 slices shown (2 of 2)]
[im 1/33]
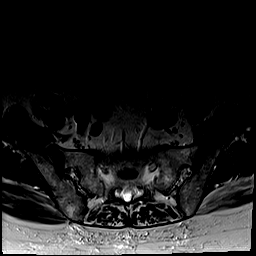
[im 5/33]
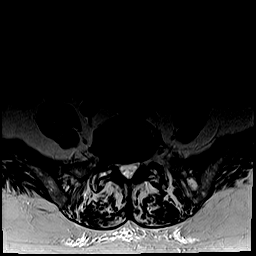
[im 10/33]
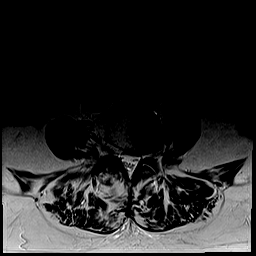
[im 15/33]
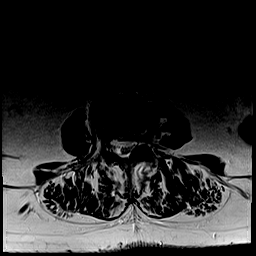
[im 18/33]
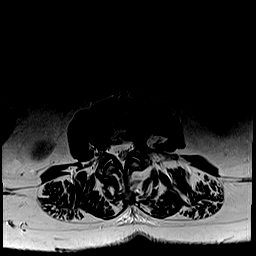
[im 23/33]
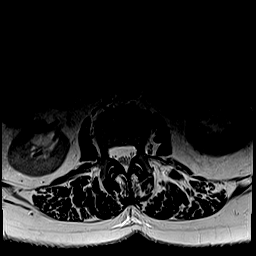
[im 28/33]
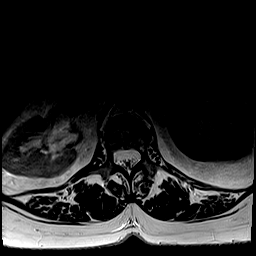
[im 33/33]
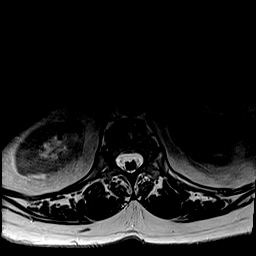

[Series 9: T1 · axial · 4.0mm · 0.39mm/px · z∈[-142,+54]mm · 8 of 33 slices shown (2 of 2)]
[im 1/33]
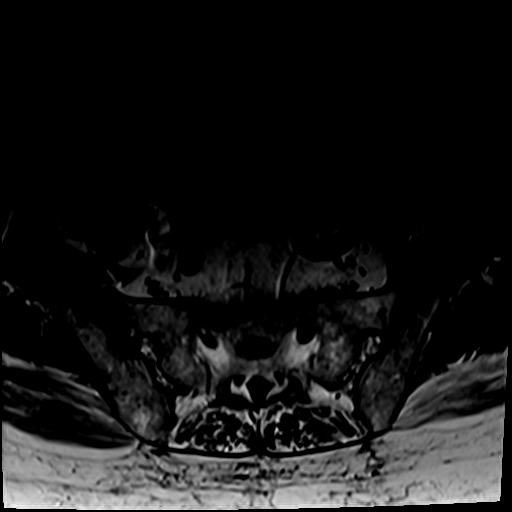
[im 5/33]
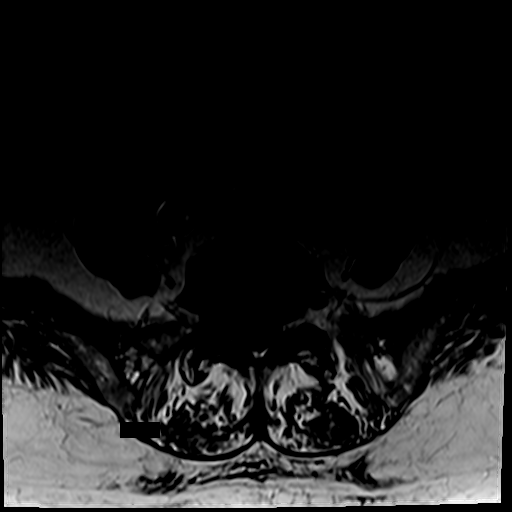
[im 10/33]
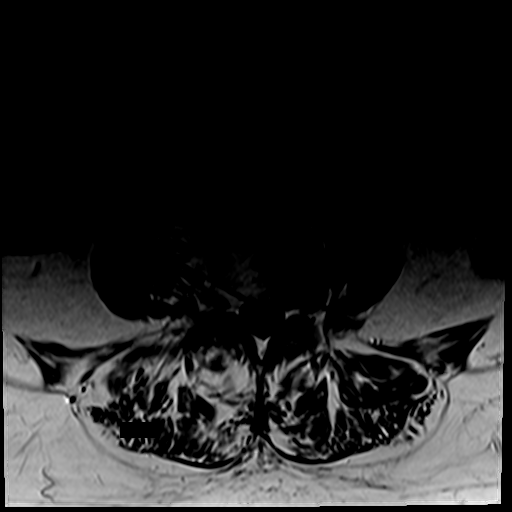
[im 15/33]
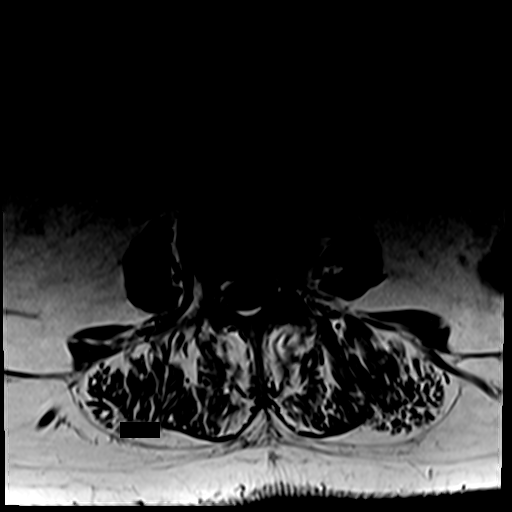
[im 18/33]
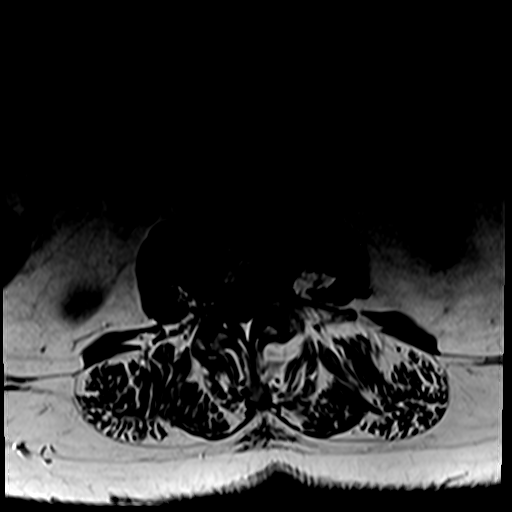
[im 23/33]
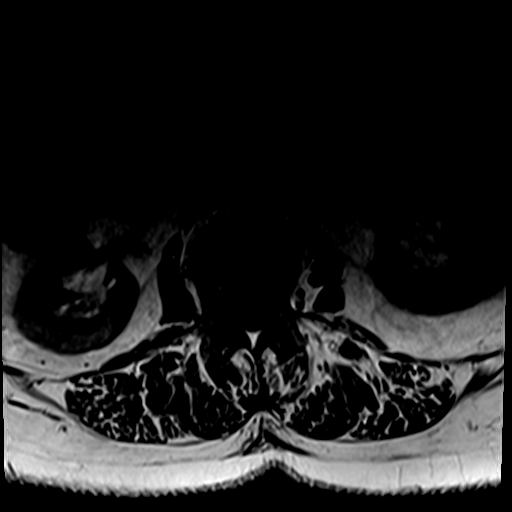
[im 28/33]
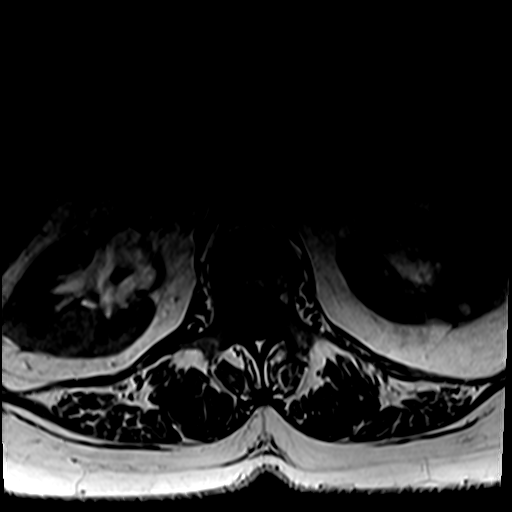
[im 33/33]
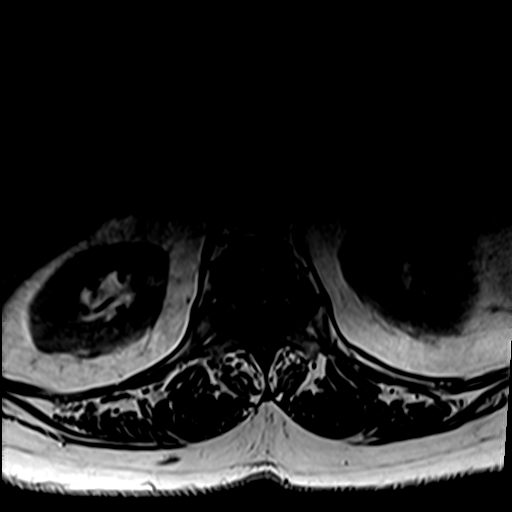

[30 of 48 positions shown; findings below may reference images not displayed]

FINDINGS: Segmentation: Standard segmentation is assumed. The inferior-most
fully formed intervertebral disc labeled L5-S1.

Alignment: Dextrocurvature. Slight (grade 1) anterolisthesis of L5
on S1. Otherwise, no substantial sagittal subluxation.

Vertebrae: Degenerative/discogenic endplate signal changes are
greatest at L2-L3 and L4-L5. No specific evidence of acute fracture
or discitis/osteomyelitis. Mildly heterogeneous marrow without
suspicious bone lesion.

Conus medullaris and cauda equina: Conus extends to the L1 level.
Conus appears normal.

Paraspinal and other soft tissues: Unremarkable.

Disc levels:

T12-L1: No significant disc protrusion, foraminal stenosis, or canal
stenosis.

L1-L2: Mild disc bulging without significant canal or foraminal
stenosis.

L2-L3: Degenerative disc height loss. Disc bulging and endplate
spurring and mild bilateral facet arthropathy. Resulting mild
bilateral foraminal stenosis. No significant canal stenosis.

L3-L4: Broad disc bulge with endplate spurring. Moderate bilateral
facet arthropathy. Resulting moderate canal stenosis and right
foraminal stenosis. Mild left foraminal stenosis.

L4-L5: Degenerative disc height loss. Broad disc bulge with endplate
spurring. Moderate right and mild left facet arthropathy. Mild canal
and bilateral subarticular recess stenosis. Resulting mild to
moderate right and mild left foraminal stenosis.

L5-S1: Slight (grade 1 anterolisthesis. Right eccentric disc bulge
with moderate to severe bilateral facet arthropathy. Resulting
moderate to severe right and mild left foraminal stenosis. Mild
canal and right greater than left subarticular recess stenosis.
IMPRESSION: 1. At L5-S1, moderate to severe right and mild left foraminal
stenosis with mild canal and right greater than left subarticular
recess stenosis.
2. At L3-L4, moderate canal and right foraminal stenosis with mild
left foraminal stenosis.
3. At L4-L5, mild-to-moderate right and mild left foraminal stenosis
with mild canal stenosis.
4. At L2-L3, mild bilateral foraminal stenosis.

## 2022-05-12 ENCOUNTER — Other Ambulatory Visit: Payer: Medicare Other

## 2022-05-13 ENCOUNTER — Telehealth: Payer: Self-pay | Admitting: Internal Medicine

## 2022-05-13 DIAGNOSIS — E7800 Pure hypercholesterolemia, unspecified: Secondary | ICD-10-CM

## 2022-05-13 DIAGNOSIS — R739 Hyperglycemia, unspecified: Secondary | ICD-10-CM

## 2022-05-13 DIAGNOSIS — I10 Essential (primary) hypertension: Secondary | ICD-10-CM

## 2022-05-13 NOTE — Telephone Encounter (Signed)
Patient has a ab appt 05/18/2022, there are no orders in.

## 2022-05-14 ENCOUNTER — Ambulatory Visit: Payer: Medicare Other | Admitting: Internal Medicine

## 2022-05-14 NOTE — Addendum Note (Signed)
Addended by: Alisa Graff on: 05/14/2022 03:46 AM   Modules accepted: Orders

## 2022-05-14 NOTE — Telephone Encounter (Signed)
Order placed for f/u labs.  

## 2022-05-18 ENCOUNTER — Other Ambulatory Visit (INDEPENDENT_AMBULATORY_CARE_PROVIDER_SITE_OTHER): Payer: Medicare Other

## 2022-05-18 DIAGNOSIS — I10 Essential (primary) hypertension: Secondary | ICD-10-CM | POA: Diagnosis not present

## 2022-05-18 DIAGNOSIS — E7800 Pure hypercholesterolemia, unspecified: Secondary | ICD-10-CM | POA: Diagnosis not present

## 2022-05-18 DIAGNOSIS — R739 Hyperglycemia, unspecified: Secondary | ICD-10-CM | POA: Diagnosis not present

## 2022-05-18 LAB — LIPID PANEL
Cholesterol: 206 mg/dL — ABNORMAL HIGH (ref 0–200)
HDL: 75.7 mg/dL (ref >39.00)
LDL Cholesterol: 105 mg/dL — ABNORMAL HIGH (ref 0–99)
NonHDL: 130.4
Total CHOL/HDL Ratio: 3
Triglycerides: 129 mg/dL (ref 0.0–149.0)
VLDL: 25.8 mg/dL (ref 0.0–40.0)

## 2022-05-18 LAB — HEPATIC FUNCTION PANEL
ALT: 16 U/L (ref 0–35)
AST: 21 U/L (ref 0–37)
Albumin: 4 g/dL (ref 3.5–5.2)
Alkaline Phosphatase: 67 U/L (ref 39–117)
Bilirubin, Direct: 0.1 mg/dL (ref 0.0–0.3)
Total Bilirubin: 0.5 mg/dL (ref 0.2–1.2)
Total Protein: 6.2 g/dL (ref 6.0–8.3)

## 2022-05-18 LAB — BASIC METABOLIC PANEL
BUN: 10 mg/dL (ref 6–23)
CO2: 29 mEq/L (ref 19–32)
Calcium: 9.2 mg/dL (ref 8.4–10.5)
Chloride: 105 mEq/L (ref 96–112)
Creatinine, Ser: 0.61 mg/dL (ref 0.40–1.20)
GFR: 86.6 mL/min (ref >60.00)
Glucose, Bld: 101 mg/dL — ABNORMAL HIGH (ref 70–99)
Potassium: 3.5 mEq/L (ref 3.5–5.1)
Sodium: 143 mEq/L (ref 135–145)

## 2022-05-18 LAB — TSH: TSH: 1.95 u[IU]/mL (ref 0.35–5.50)

## 2022-05-18 LAB — HEMOGLOBIN A1C: Hgb A1c MFr Bld: 5.9 % (ref 4.6–6.5)

## 2022-05-21 ENCOUNTER — Encounter: Payer: Self-pay | Admitting: Internal Medicine

## 2022-05-21 ENCOUNTER — Ambulatory Visit: Payer: Medicare Other | Admitting: Internal Medicine

## 2022-05-21 VITALS — BP 124/72 | HR 77 | Temp 97.5°F | Ht 63.0 in | Wt 170.2 lb

## 2022-05-21 DIAGNOSIS — Z23 Encounter for immunization: Secondary | ICD-10-CM

## 2022-05-21 DIAGNOSIS — I10 Essential (primary) hypertension: Secondary | ICD-10-CM

## 2022-05-21 DIAGNOSIS — R739 Hyperglycemia, unspecified: Secondary | ICD-10-CM | POA: Diagnosis not present

## 2022-05-21 DIAGNOSIS — D0512 Intraductal carcinoma in situ of left breast: Secondary | ICD-10-CM | POA: Diagnosis not present

## 2022-05-21 DIAGNOSIS — M545 Low back pain, unspecified: Secondary | ICD-10-CM

## 2022-05-21 DIAGNOSIS — C4492 Squamous cell carcinoma of skin, unspecified: Secondary | ICD-10-CM

## 2022-05-21 DIAGNOSIS — R911 Solitary pulmonary nodule: Secondary | ICD-10-CM | POA: Diagnosis not present

## 2022-05-21 DIAGNOSIS — R251 Tremor, unspecified: Secondary | ICD-10-CM

## 2022-05-21 DIAGNOSIS — D649 Anemia, unspecified: Secondary | ICD-10-CM

## 2022-05-21 DIAGNOSIS — R7989 Other specified abnormal findings of blood chemistry: Secondary | ICD-10-CM

## 2022-05-21 DIAGNOSIS — E7800 Pure hypercholesterolemia, unspecified: Secondary | ICD-10-CM

## 2022-05-21 MED ORDER — ALBUTEROL SULFATE HFA 108 (90 BASE) MCG/ACT IN AERS: 2.0000 | INHALATION_SPRAY | Freq: Four times a day (QID) | RESPIRATORY_TRACT | 1 refills | Status: DC | PRN

## 2022-05-21 NOTE — Assessment & Plan Note (Signed)
Low carb diet and exercise.  Follow met b and a1c.   Lab Results  Component Value Date   HGBA1C 5.9 05/18/2022

## 2022-05-21 NOTE — Progress Notes (Signed)
Patient ID: Helen Marshall, female   DOB: May 12, 1946, 76 y.o.   MRN: [id number]   Subjective:    Patient ID: Helen Marshall, female    DOB: March 18, 1946, 76 y.o.   MRN: [id number]   Patient here for  Chief Complaint  Patient presents with   Follow-up    4 mth f/u   .   HPI Here to follow up regarding hypercholesterolemia and hypertension.  Has chronic low back pain- seeing Dr Sharol Given and Dr Christella Noa.  Last injection 03/2022 - back better. Has seen Dr Bary Castilla - f/u breast cancer.  Due f/u screening mammogram 09/2022. Also s/p recent ventral hernia repair 03/31/22.  F/u 04/08/22 - wear binder. Now doing well.  No increased pain.  Eating.  Bowels stable.  No chest pain or sob reported.  No cough or congestion.  Did report increased tremors - right side - notices more.    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   Pre-diabetes    Tubular adenoma of colon    Umbilical hernia 31/4970   Past Surgical History:  Procedure Laterality Date   ABDOMINAL HYSTERECTOMY  1989   fibroids   ANKLE SURGERY Left 1999   ANTERIOR AND POSTERIOR REPAIR WITH SACROSPINOUS FIXATION N/A 07/20/2021   Procedure: ANTERIOR AND REPAIR  WITH SACROSPINOUS FIXATION;  Surgeon: Jaquita Folds, MD;  Location: [facility];  Service: Gynecology;  Laterality: N/A;   BREAST SURGERY Left 1970   biopsy   CATARACT EXTRACTION, BILATERAL  June & July 2017   CHOLECYSTECTOMY  2001   COLONOSCOPY  06/02/2011   Verdie Shire, MD; diverticulosis, submucosal lipoma of the sigmoid colon.   CYSTOSCOPY N/A 07/20/2021   Procedure: CYSTOSCOPY;  Surgeon: Jaquita Folds, MD;  Location: [facility];  Service: Gynecology;  Laterality: N/A;   GALLBLADDER SURGERY     HERNIA REPAIR  2001, 2004   HERNIA REPAIR  01/22/2013   Repeat repair of umbilical defect, 2.5 cm. Primary repair.   INSERTION OF MESH N/A 03/31/2022   Procedure: INSERTION OF MESH;  Surgeon: Robert Bellow, MD;  Location: [facility];  Service: General;  Laterality: N/A;   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   shoulder   TUBAL LIGATION  1976   UPPER GI ENDOSCOPY  08/22/2014   Dr Lenna Sciara. Hilarie Fredrickson   VENTRAL HERNIA REPAIR N/A 03/31/2022   Procedure: HERNIA REPAIR VENTRAL  ADULT;  Surgeon: Robert Bellow, MD;  Location: [facility];  Service: General;  Laterality: N/A;  Floyce Stakes, RNFA to assist TAPP block per anesthesia   Family History  Problem Relation Age of Onset   CVA Father    Alcohol abuse Father    Heart disease Mother        myocardial infarction   Diabetes Mother    Hearing loss Mother    Hypertension Brother        x2   Arthritis Brother    Hearing loss Brother    Alcohol abuse Brother    Arthritis Brother    Hearing loss Brother    Arthritis/Rheumatoid Sister        x2   Breast cancer Sister 60   Arthritis Sister    Hearing loss Sister    Arthritis Sister    Hearing loss Sister    Asthma Sister    Arthritis Sister    Hearing loss Sister    Hypothyroidism Sister    Arthritis Sister    Hearing loss Sister    Cancer Other         breast - paternal first cousin   Colon cancer Maternal Uncle    Colon polyps Son    Colon polyps Sister    Arthritis Sister    Hearing loss Sister    Social History   Socioeconomic History   Marital status: Married    Spouse name: ,Nathaneil Canary   Number of children: 2   Years of education: Not on file   Highest education level: Not on file  Occupational History   Occupation: Retired  Tobacco Use   Smoking status: Never   Smokeless tobacco: Never  Vaping Use   Vaping Use: Never used  Substance and Sexual Activity   Alcohol use: No    Alcohol/week: 0.0 standard drinks of alcohol   Drug use: No   Sexual activity: Not Currently  Other Topics Concern   Not on file  Social History Narrative   Not on file   Social Determinants of Health   Financial Resource Strain: Low Risk  (01/04/2022)   Overall Financial Resource Strain (CARDIA)    Difficulty of Paying Living Expenses: Not hard at all  Food Insecurity: No Food Insecurity (01/04/2022)   Hunger Vital Sign    Worried About Running Out of Food in the Last Year: Never true    Ran Out of Food in the Last Year: Never true  Transportation Needs: No Transportation Needs (01/04/2022)   PRAPARE - Hydrologist (Medical): No    Lack of Transportation (Non-Medical): No  Physical Activity: Unknown (08/10/2018)   Exercise Vital Sign    Days of Exercise per Week: 0 days    Minutes of Exercise per Session: Not on file  Stress: No Stress Concern Present (01/04/2022)   [place]    Feeling of Stress : Not at all  Social Connections: Unknown (01/04/2022)   Social Connection and Isolation Panel [NHANES]    Frequency of Communication with Friends and Family: Once a week    Frequency of Social Gatherings with Friends and Family: Once a week    Attends Religious Services: Not on Advertising copywriter or Organizations: Not on file    Attends Theatre manager Meetings: Not on file    Marital Status: Married     Review of Systems  Constitutional:  Negative for appetite change and unexpected weight change.  HENT:  Negative for congestion and sinus pressure.   Respiratory:  Negative for cough, chest tightness and shortness of breath.   Cardiovascular:  Negative for chest pain, palpitations and leg swelling.  Gastrointestinal:  Negative for abdominal pain, diarrhea, nausea and vomiting.  Genitourinary:  Negative for difficulty urinating and dysuria.  Musculoskeletal:  Negative for joint swelling and myalgias.  Skin:  Negative for color change and rash.  Neurological:  Positive for tremors. Negative for dizziness and headaches.  Psychiatric/Behavioral:  Negative for agitation and dysphoric mood.        Objective:     BP 124/72 (BP Location: Left Arm, Patient Position: Sitting, Cuff Size: Large)   Pulse 77   Temp (!) 97.5 F (36.4 C) (Oral)   Ht _0  (1.6 m)   Wt 170 lb 3.2 oz (77.2 kg)   LMP 07/21/1988   SpO2 97%   BMI 30.15 kg/m  Wt Readings from Last 3 Encounters:  05/21/22 170 lb 3.2 oz (77.2 kg)  03/31/22 169 lb 1.5 oz (76.7 kg)  01/12/22 169 lb 9.6 oz (76.9 kg)    Physical Exam Vitals reviewed.  Constitutional:      General: She is not in acute distress.    Appearance: Normal appearance.  HENT:     Head: Normocephalic and atraumatic.     Right Ear: External ear normal.     Left Ear: External ear normal.  Eyes:     General: No scleral icterus.       Right eye: No discharge.        Left eye: No discharge.     Conjunctiva/sclera: Conjunctivae normal.  Neck:     Thyroid: No thyromegaly.  Cardiovascular:     Rate and Rhythm: Normal rate and regular rhythm.  Pulmonary:     Effort: No respiratory distress.     Breath sounds: Normal breath sounds. No wheezing.  Abdominal:     General: Bowel sounds are normal.     Palpations: Abdomen is soft.     Tenderness: There is no abdominal tenderness.   Musculoskeletal:        General: No swelling or tenderness.     Cervical back: Neck supple. No tenderness.  Lymphadenopathy:     Cervical: No cervical adenopathy.  Skin:    Findings: No erythema or rash.  Neurological:     Mental Status: She is alert.  Psychiatric:        Mood and Affect: Mood normal.        Behavior: Behavior normal.      Outpatient Encounter Medications as of 05/21/2022  Medication Sig   albuterol (VENTOLIN HFA) 108 (90 Base) MCG/ACT inhaler Inhale 2 puffs into the lungs every 6 (six) hours as needed for wheezing or shortness of breath.   amLODipine (NORVASC) 5 MG tablet TAKE ONE TABLET BY MOUTH EVERY DAY   benazepril (LOTENSIN) 40 MG tablet TAKE 1 TABLET BY MOUTH DAILY   calcium-vitamin D (OSCAL-500) 500-400 MG-UNIT tablet Take 1 tablet by mouth daily.    [EXPIRED] erythromycin ophthalmic ointment Administer to both eyes two (2) times a day for 5 days. On eyelid incisions   ezetimibe (ZETIA) 10 MG tablet TAKE 1 TABLET BY MOUTH DAILY   fish oil-omega-3 fatty acids 1000 MG capsule QID.   fluticasone (FLONASE) 50 MCG/ACT nasal spray Place 2 sprays into both nostrils as needed.   ibuprofen (ADVIL) 600 MG tablet Take 1 tablet (600 mg total)  by mouth every 6 (six) hours as needed.   Multiple Vitamin (MULTIVITAMIN) tablet Take 1 tablet by mouth daily.   neomycin-polymyxin b-dexamethasone (MAXITROL) 3.5-10000-0.1 SUSP SMARTSIG:In Eye(s)   niacin (NIASPAN) 1000 MG CR tablet Take 1,000 mg by mouth 2 (two) times daily.   oxyCODONE (ROXICODONE) 5 MG immediate release tablet Take 1 tablet (5 mg total) by mouth every 4 (four) hours as needed for severe pain.   Probiotic Product (VSL#3 PO) Take by mouth. 1 capsul daily   psyllium (METAMUCIL) 58.6 % powder Take 1 packet by mouth daily.   traMADol (ULTRAM) 50 MG tablet Take 1 tablet (50 mg total) by mouth every 4 (four) hours as needed.   triamcinolone cream (KENALOG) 0.1 % Apply 1 application topically as needed.     [DISCONTINUED] ondansetron (ZOFRAN-ODT) 4 MG disintegrating tablet Take 1 tablet (4 mg total) by mouth every 4 (four) hours as needed for nausea or vomiting. (Patient not taking: Reported on 05/21/2022)   No facility-administered encounter medications on file as of 05/21/2022.     Lab Results  Component Value Date   WBC 6.4 03/29/2022   HGB 13.2 03/29/2022   HCT 40.2 03/29/2022   PLT 228 03/29/2022   GLUCOSE 101 (H) 05/18/2022   CHOL 206 (H) 05/18/2022   TRIG 129.0 05/18/2022   HDL 75.70 05/18/2022   LDLDIRECT 101.4 05/07/2013   LDLCALC 105 (H) 05/18/2022   ALT 16 05/18/2022   AST 21 05/18/2022   NA 143 05/18/2022   K 3.5 05/18/2022   CL 105 05/18/2022   CREATININE 0.61 05/18/2022   BUN 10 05/18/2022   CO2 29 05/18/2022   TSH 1.95 05/18/2022   HGBA1C 5.9 05/18/2022   MICROALBUR <0.7 04/19/2018    [place] OR NERVE BLOCK-IMAGE ONLY [facility])  Result Date: 03/31/2022 There is no interpretation for this exam.  This order is for images obtained during a surgical procedure.  Please See "Surgeries" Tab for more information regarding the procedure.       Assessment & Plan:   Problem List Items Addressed This Visit     Abnormal liver function test    Diet and exercise.  Follow liver panel.       Anemia    Follow cbc.       Back pain    Chronic low back pain as outlined.  Seeing Dr Christella Noa.  S/p injection.  Doing better.  Follow       Hypercholesterolemia    Intolerant to statin medication.  On zetia.  Low cholesterol diet and exercise.  Follow lipid panel and liver function tests.        Relevant Orders   Hepatic function panel   Lipid panel   Hyperglycemia    Low carb diet and exercise.  Follow met b and a1c.   Lab Results  Component Value Date   HGBA1C 5.9 05/18/2022       Relevant Orders   Hemoglobin A1c   Hypertension    Continue amlodipine, benazepril.  Blood pressure has been under good control on this regimen.  Follow pressures.  Follow metabolic panel.         Relevant Orders   Basic metabolic panel   Lung nodule     Incidental finding CT abdomen 04/26/21.  Had f/u chest CT 10/02/21 - stable 54m right lower lobe pulmonary nodule.        Neoplasm of left breast, primary tumor 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)     Has seen  Dr Bary Castilla - f/u breast cancer.  Due f/u screening mammogram 09/2022.      Squamous cell skin cancer    Removed from left leg.  F/u body check 06/2022.        Tremor    Noticed more on right side.  Quivering of mouth.  Discussed.  Consider neurology referral.        Other Visit Diagnoses     Need for immunization against influenza    -  Primary   Relevant Orders   Flu Vaccine QUAD High Dose(Fluad) (Completed)        Einar Pheasant, MD

## 2022-05-24 ENCOUNTER — Encounter: Payer: Self-pay | Admitting: *Deleted

## 2022-05-30 ENCOUNTER — Encounter: Payer: Self-pay | Admitting: Internal Medicine

## 2022-05-30 ENCOUNTER — Telehealth: Payer: Self-pay | Admitting: Internal Medicine

## 2022-05-30 DIAGNOSIS — R251 Tremor, unspecified: Secondary | ICD-10-CM | POA: Insufficient documentation

## 2022-05-30 DIAGNOSIS — C4492 Squamous cell carcinoma of skin, unspecified: Secondary | ICD-10-CM | POA: Insufficient documentation

## 2022-05-30 NOTE — Assessment & Plan Note (Signed)
Intolerant to statin medication.  On zetia.  Low cholesterol diet and exercise.  Follow lipid panel and liver function tests.   

## 2022-05-30 NOTE — Assessment & Plan Note (Signed)
Chronic low back pain as outlined.  Seeing Dr Christella Noa.  S/p injection.  Doing better.  Follow

## 2022-05-30 NOTE — Assessment & Plan Note (Signed)
Diet and exercise.  Follow liver panel.   

## 2022-05-30 NOTE — Telephone Encounter (Signed)
Order placed for neurology referral.   

## 2022-05-30 NOTE — Assessment & Plan Note (Signed)
Incidental finding CT abdomen 04/26/21.  Had f/u chest CT 10/02/21 - stable 20m right lower lobe pulmonary nodule.

## 2022-05-30 NOTE — Assessment & Plan Note (Addendum)
Noticed more on right side.  Quivering of mouth.  Discussed.  Consider neurology referral.

## 2022-05-30 NOTE — Addendum Note (Signed)
Addended by: Alisa Graff on: 05/30/2022 10:52 PM   Modules accepted: Orders

## 2022-05-30 NOTE — Assessment & Plan Note (Signed)
Removed from left leg.  F/u body check 06/2022.

## 2022-05-30 NOTE — Assessment & Plan Note (Signed)
Follow cbc.  

## 2022-05-30 NOTE — Assessment & Plan Note (Signed)
Continue amlodipine, benazepril.  Blood pressure has been under good control on this regimen.  Follow pressures.  Follow metabolic panel.   

## 2022-05-30 NOTE — Assessment & Plan Note (Signed)
Has seen Dr Byrnett - f/u breast cancer.  Due f/u screening mammogram 09/2022. 

## 2022-05-30 NOTE — Telephone Encounter (Signed)
My chart message sent to pt regarding neurology referral.

## 2022-06-12 ENCOUNTER — Encounter: Payer: Self-pay | Admitting: Internal Medicine

## 2022-06-12 DIAGNOSIS — H02836 Dermatochalasis of left eye, unspecified eyelid: Secondary | ICD-10-CM | POA: Insufficient documentation

## 2022-06-17 ENCOUNTER — Other Ambulatory Visit: Payer: Self-pay | Admitting: Neurosurgery

## 2022-06-17 ENCOUNTER — Encounter (HOSPITAL_COMMUNITY): Payer: Self-pay | Admitting: Neurosurgery

## 2022-06-18 ENCOUNTER — Ambulatory Visit (HOSPITAL_COMMUNITY): Payer: Medicare Other

## 2022-06-18 ENCOUNTER — Encounter (HOSPITAL_COMMUNITY)
Admission: RE | Disposition: A | Discharge status: Home / Self Care | Payer: Self-pay | Source: Home / Self Care | Attending: Neurosurgery

## 2022-06-18 ENCOUNTER — Ambulatory Visit (HOSPITAL_BASED_OUTPATIENT_CLINIC_OR_DEPARTMENT_OTHER): Payer: Medicare Other | Admitting: Anesthesiology

## 2022-06-18 ENCOUNTER — Ambulatory Visit (HOSPITAL_COMMUNITY): Payer: Medicare Other | Admitting: Anesthesiology

## 2022-06-18 ENCOUNTER — Other Ambulatory Visit: Payer: Self-pay

## 2022-06-18 ENCOUNTER — Observation Stay (HOSPITAL_COMMUNITY)
Admission: RE | Admit: 2022-06-18 | Discharge: 2022-06-19 | Disposition: A | Discharge status: Home / Self Care | Payer: Medicare Other | Attending: Neurosurgery | Admitting: Neurosurgery

## 2022-06-18 DIAGNOSIS — M48061 Spinal stenosis, lumbar region without neurogenic claudication: Secondary | ICD-10-CM | POA: Diagnosis not present

## 2022-06-18 DIAGNOSIS — M48062 Spinal stenosis, lumbar region with neurogenic claudication: Secondary | ICD-10-CM | POA: Diagnosis present

## 2022-06-18 DIAGNOSIS — M1990 Unspecified osteoarthritis, unspecified site: Secondary | ICD-10-CM | POA: Insufficient documentation

## 2022-06-18 DIAGNOSIS — I10 Essential (primary) hypertension: Secondary | ICD-10-CM | POA: Insufficient documentation

## 2022-06-18 DIAGNOSIS — R7303 Prediabetes: Secondary | ICD-10-CM | POA: Insufficient documentation

## 2022-06-18 DIAGNOSIS — K219 Gastro-esophageal reflux disease without esophagitis: Secondary | ICD-10-CM | POA: Diagnosis not present

## 2022-06-18 HISTORY — PX: LUMBAR LAMINECTOMY/DECOMPRESSION MICRODISCECTOMY: SHX5026

## 2022-06-18 LAB — CBC
HCT: 38.5 % (ref 36.0–46.0)
Hemoglobin: 12.7 g/dL (ref 12.0–15.0)
MCH: 31 pg (ref 26.0–34.0)
MCHC: 33 g/dL (ref 30.0–36.0)
MCV: 93.9 fL (ref 80.0–100.0)
Platelets: 208 10*3/uL (ref 150–400)
RBC: 4.1 MIL/uL (ref 3.87–5.11)
RDW: 13.7 % (ref 11.5–15.5)
WBC: 4.7 10*3/uL (ref 4.0–10.5)
nRBC: 0 % (ref 0.0–0.2)

## 2022-06-18 LAB — SURGICAL PCR SCREEN
MRSA, PCR: NEGATIVE
Staphylococcus aureus: POSITIVE — AB

## 2022-06-18 SURGERY — LUMBAR LAMINECTOMY/DECOMPRESSION MICRODISCECTOMY 1 LEVEL
Anesthesia: General | Laterality: Bilateral

## 2022-06-18 MED ORDER — ORAL CARE MOUTH RINSE: 15.0000 mL | Freq: Once | OROMUCOSAL | Status: AC

## 2022-06-18 MED ORDER — LIDOCAINE-EPINEPHRINE 0.5 %-1:200000 IJ SOLN
INTRAMUSCULAR | Status: AC
  Filled 2022-06-18: qty 1

## 2022-06-18 MED ORDER — LIDOCAINE-EPINEPHRINE 1 %-1:100000 IJ SOLN
INTRAMUSCULAR | Status: DC | PRN
  Administered 2022-06-18: 20 mL

## 2022-06-18 MED ORDER — AMLODIPINE BESYLATE 5 MG PO TABS
5.0000 mg | ORAL_TABLET | Freq: Every day | ORAL | Status: DC
  Administered 2022-06-18: 5 mg via ORAL
  Filled 2022-06-18: qty 1

## 2022-06-18 MED ORDER — SOD CITRATE-CITRIC ACID 500-334 MG/5ML PO SOLN: 30.0000 mL | Freq: Once | ORAL | Status: AC

## 2022-06-18 MED ORDER — ACETAMINOPHEN 325 MG PO TABS: 650.0000 mg | ORAL_TABLET | ORAL | Status: DC | PRN

## 2022-06-18 MED ORDER — ACETAMINOPHEN 500 MG PO TABS
1000.0000 mg | ORAL_TABLET | Freq: Four times a day (QID) | ORAL | Status: DC | PRN
  Administered 2022-06-18 – 2022-06-19 (×2): 1000 mg via ORAL
  Filled 2022-06-18 (×2): qty 2

## 2022-06-18 MED ORDER — STERILE WATER FOR IRRIGATION IR SOLN
Status: DC | PRN
  Administered 2022-06-18: 1000 mL

## 2022-06-18 MED ORDER — ONDANSETRON HCL 4 MG/2ML IJ SOLN: 4.0000 mg | Freq: Once | INTRAMUSCULAR | Status: DC | PRN

## 2022-06-18 MED ORDER — CHLORHEXIDINE GLUCONATE 0.12 % MT SOLN
15.0000 mL | Freq: Once | OROMUCOSAL | Status: AC
  Administered 2022-06-18: 15 mL via OROMUCOSAL
  Filled 2022-06-18: qty 15

## 2022-06-18 MED ORDER — MENTHOL 3 MG MT LOZG: 1.0000 | LOZENGE | OROMUCOSAL | Status: DC | PRN

## 2022-06-18 MED ORDER — DEXAMETHASONE SODIUM PHOSPHATE 10 MG/ML IJ SOLN
INTRAMUSCULAR | Status: AC
  Filled 2022-06-18: qty 1

## 2022-06-18 MED ORDER — OXYCODONE HCL 5 MG PO TABS: 10.0000 mg | ORAL_TABLET | ORAL | Status: DC | PRN

## 2022-06-18 MED ORDER — SOD CITRATE-CITRIC ACID 500-334 MG/5ML PO SOLN
ORAL | Status: AC
  Administered 2022-06-18: 30 mL via ORAL
  Filled 2022-06-18: qty 30

## 2022-06-18 MED ORDER — SODIUM CHLORIDE 0.9% FLUSH
3.0000 mL | Freq: Two times a day (BID) | INTRAVENOUS | Status: DC
  Administered 2022-06-18: 3 mL via INTRAVENOUS

## 2022-06-18 MED ORDER — CEFAZOLIN SODIUM-DEXTROSE 2-4 GM/100ML-% IV SOLN
INTRAVENOUS | Status: AC
  Filled 2022-06-18: qty 100

## 2022-06-18 MED ORDER — THROMBIN 5000 UNITS EX SOLR
CUTANEOUS | Status: DC | PRN
  Administered 2022-06-18: 10000 [IU] via TOPICAL

## 2022-06-18 MED ORDER — DIAZEPAM 5 MG PO TABS: 5.0000 mg | ORAL_TABLET | Freq: Four times a day (QID) | ORAL | Status: DC | PRN

## 2022-06-18 MED ORDER — ONDANSETRON HCL 4 MG/2ML IJ SOLN
INTRAMUSCULAR | Status: DC | PRN
  Administered 2022-06-18: 4 mg via INTRAVENOUS

## 2022-06-18 MED ORDER — FAMOTIDINE IN NACL 20-0.9 MG/50ML-% IV SOLN
INTRAVENOUS | Status: AC
  Administered 2022-06-18: 20 mg via INTRAVENOUS
  Filled 2022-06-18: qty 50

## 2022-06-18 MED ORDER — PROPOFOL 10 MG/ML IV BOLUS
INTRAVENOUS | Status: DC | PRN
  Administered 2022-06-18: 30 mg via INTRAVENOUS
  Administered 2022-06-18: 100 mg via INTRAVENOUS

## 2022-06-18 MED ORDER — AMISULPRIDE (ANTIEMETIC) 5 MG/2ML IV SOLN: 10.0000 mg | Freq: Once | INTRAVENOUS | Status: DC | PRN

## 2022-06-18 MED ORDER — FENTANYL CITRATE (PF) 250 MCG/5ML IJ SOLN
INTRAMUSCULAR | Status: AC
  Filled 2022-06-18: qty 5

## 2022-06-18 MED ORDER — LIDOCAINE 2% (20 MG/ML) 5 ML SYRINGE
INTRAMUSCULAR | Status: AC
  Filled 2022-06-18: qty 5

## 2022-06-18 MED ORDER — LORATADINE 10 MG PO TABS: 10.0000 mg | ORAL_TABLET | Freq: Every day | ORAL | Status: DC | PRN

## 2022-06-18 MED ORDER — LIDOCAINE 2% (20 MG/ML) 5 ML SYRINGE
INTRAMUSCULAR | Status: DC | PRN
  Administered 2022-06-18: 60 mg via INTRAVENOUS

## 2022-06-18 MED ORDER — PHENYLEPHRINE 80 MCG/ML (10ML) SYRINGE FOR IV PUSH (FOR BLOOD PRESSURE SUPPORT)
PREFILLED_SYRINGE | INTRAVENOUS | Status: AC
  Filled 2022-06-18: qty 10

## 2022-06-18 MED ORDER — HYDROMORPHONE HCL 1 MG/ML IJ SOLN: 0.2500 mg | INTRAMUSCULAR | Status: DC | PRN

## 2022-06-18 MED ORDER — ACETAMINOPHEN 650 MG RE SUPP: 650.0000 mg | RECTAL | Status: DC | PRN

## 2022-06-18 MED ORDER — ROCURONIUM BROMIDE 10 MG/ML (PF) SYRINGE
PREFILLED_SYRINGE | INTRAVENOUS | Status: AC
  Filled 2022-06-18: qty 10

## 2022-06-18 MED ORDER — PHENYLEPHRINE HCL-NACL 20-0.9 MG/250ML-% IV SOLN
INTRAVENOUS | Status: DC | PRN
  Administered 2022-06-18: 25 ug/min via INTRAVENOUS

## 2022-06-18 MED ORDER — TIZANIDINE HCL 4 MG PO TABS: 4.0000 mg | ORAL_TABLET | Freq: Four times a day (QID) | ORAL | 0 refills | Status: DC | PRN

## 2022-06-18 MED ORDER — POTASSIUM CHLORIDE IN NACL 20-0.9 MEQ/L-% IV SOLN: INTRAVENOUS | Status: DC

## 2022-06-18 MED ORDER — PHENOL 1.4 % MT LIQD: 1.0000 | OROMUCOSAL | Status: DC | PRN

## 2022-06-18 MED ORDER — ONDANSETRON HCL 4 MG PO TABS
4.0000 mg | ORAL_TABLET | Freq: Four times a day (QID) | ORAL | Status: DC | PRN
  Administered 2022-06-19: 4 mg via ORAL
  Filled 2022-06-18: qty 1

## 2022-06-18 MED ORDER — ACETAMINOPHEN 500 MG PO TABS: 1000.0000 mg | ORAL_TABLET | Freq: Once | ORAL | Status: AC

## 2022-06-18 MED ORDER — EZETIMIBE 10 MG PO TABS
10.0000 mg | ORAL_TABLET | Freq: Every day | ORAL | Status: DC
  Administered 2022-06-18: 10 mg via ORAL
  Filled 2022-06-18: qty 1

## 2022-06-18 MED ORDER — FLUTICASONE PROPIONATE 50 MCG/ACT NA SUSP: 2.0000 | NASAL | Status: DC | PRN

## 2022-06-18 MED ORDER — THROMBIN 5000 UNITS EX SOLR
CUTANEOUS | Status: AC
  Filled 2022-06-18: qty 10000

## 2022-06-18 MED ORDER — CELECOXIB 200 MG PO CAPS
200.0000 mg | ORAL_CAPSULE | Freq: Two times a day (BID) | ORAL | Status: DC
  Administered 2022-06-18: 200 mg via ORAL
  Filled 2022-06-18: qty 1

## 2022-06-18 MED ORDER — ROCURONIUM BROMIDE 10 MG/ML (PF) SYRINGE
PREFILLED_SYRINGE | INTRAVENOUS | Status: DC | PRN
  Administered 2022-06-18: 100 mg via INTRAVENOUS

## 2022-06-18 MED ORDER — EPHEDRINE SULFATE-NACL 50-0.9 MG/10ML-% IV SOSY
PREFILLED_SYRINGE | INTRAVENOUS | Status: DC | PRN
  Administered 2022-06-18: 5 mg via INTRAVENOUS

## 2022-06-18 MED ORDER — LACTATED RINGERS IV SOLN: INTRAVENOUS | Status: DC

## 2022-06-18 MED ORDER — 0.9 % SODIUM CHLORIDE (POUR BTL) OPTIME
TOPICAL | Status: DC | PRN
  Administered 2022-06-18: 1000 mL

## 2022-06-18 MED ORDER — EPHEDRINE 5 MG/ML INJ
INTRAVENOUS | Status: AC
  Filled 2022-06-18: qty 5

## 2022-06-18 MED ORDER — BENAZEPRIL HCL 20 MG PO TABS
40.0000 mg | ORAL_TABLET | Freq: Every day | ORAL | Status: DC
  Administered 2022-06-18: 40 mg via ORAL
  Filled 2022-06-18: qty 2

## 2022-06-18 MED ORDER — PHENYLEPHRINE 80 MCG/ML (10ML) SYRINGE FOR IV PUSH (FOR BLOOD PRESSURE SUPPORT)
PREFILLED_SYRINGE | INTRAVENOUS | Status: DC | PRN
  Administered 2022-06-18: 80 ug via INTRAVENOUS

## 2022-06-18 MED ORDER — POLYVINYL ALCOHOL 1.4 % OP SOLN
1.0000 [drp] | Freq: Two times a day (BID) | OPHTHALMIC | Status: DC
  Administered 2022-06-18: 1 [drp] via OPHTHALMIC
  Filled 2022-06-18: qty 15

## 2022-06-18 MED ORDER — ONDANSETRON HCL 4 MG/2ML IJ SOLN
INTRAMUSCULAR | Status: AC
  Filled 2022-06-18: qty 2

## 2022-06-18 MED ORDER — FENTANYL CITRATE (PF) 250 MCG/5ML IJ SOLN
INTRAMUSCULAR | Status: DC | PRN
  Administered 2022-06-18 (×3): 50 ug via INTRAVENOUS

## 2022-06-18 MED ORDER — OYSTER SHELL CALCIUM/D3 500-5 MG-MCG PO TABS
1.0000 | ORAL_TABLET | Freq: Every day | ORAL | Status: DC
  Filled 2022-06-18: qty 1

## 2022-06-18 MED ORDER — SODIUM CHLORIDE 0.9 % IV SOLN
250.0000 mL | INTRAVENOUS | Status: DC
  Administered 2022-06-18: 250 mL via INTRAVENOUS

## 2022-06-18 MED ORDER — MELATONIN 3 MG PO TABS: 3.0000 mg | ORAL_TABLET | Freq: Every evening | ORAL | Status: DC | PRN

## 2022-06-18 MED ORDER — OXYCODONE HCL 5 MG PO TABS
5.0000 mg | ORAL_TABLET | ORAL | Status: DC | PRN
  Administered 2022-06-18 – 2022-06-19 (×2): 5 mg via ORAL
  Filled 2022-06-18 (×2): qty 1

## 2022-06-18 MED ORDER — ALBUTEROL SULFATE HFA 108 (90 BASE) MCG/ACT IN AERS: 2.0000 | INHALATION_SPRAY | Freq: Four times a day (QID) | RESPIRATORY_TRACT | Status: DC | PRN

## 2022-06-18 MED ORDER — HEPARIN SODIUM (PORCINE) 5000 UNIT/ML IJ SOLN
5000.0000 [IU] | Freq: Three times a day (TID) | INTRAMUSCULAR | Status: DC
  Administered 2022-06-18 – 2022-06-19 (×2): 5000 [IU] via SUBCUTANEOUS
  Filled 2022-06-18 (×2): qty 1

## 2022-06-18 MED ORDER — MIDAZOLAM HCL 2 MG/2ML IJ SOLN
INTRAMUSCULAR | Status: AC
  Filled 2022-06-18: qty 2

## 2022-06-18 MED ORDER — SUGAMMADEX SODIUM 200 MG/2ML IV SOLN
INTRAVENOUS | Status: DC | PRN
  Administered 2022-06-18: 200 mg via INTRAVENOUS

## 2022-06-18 MED ORDER — SODIUM CHLORIDE 0.9% FLUSH: 3.0000 mL | INTRAVENOUS | Status: DC | PRN

## 2022-06-18 MED ORDER — LIDOCAINE-EPINEPHRINE 1 %-1:100000 IJ SOLN
INTRAMUSCULAR | Status: AC
  Filled 2022-06-18: qty 1

## 2022-06-18 MED ORDER — FAMOTIDINE IN NACL 20-0.9 MG/50ML-% IV SOLN: 20.0000 mg | Freq: Once | INTRAVENOUS | Status: AC

## 2022-06-18 MED ORDER — ACETAMINOPHEN 500 MG PO TABS
1000.0000 mg | ORAL_TABLET | Freq: Once | ORAL | Status: AC
  Administered 2022-06-18: 1000 mg via ORAL
  Filled 2022-06-18: qty 2

## 2022-06-18 MED ORDER — PROPOFOL 10 MG/ML IV BOLUS
INTRAVENOUS | Status: AC
  Filled 2022-06-18: qty 20

## 2022-06-18 MED ORDER — ONDANSETRON HCL 4 MG/2ML IJ SOLN
4.0000 mg | Freq: Four times a day (QID) | INTRAMUSCULAR | Status: DC | PRN
  Administered 2022-06-18: 4 mg via INTRAVENOUS
  Filled 2022-06-18: qty 2

## 2022-06-18 MED ORDER — DEXAMETHASONE SODIUM PHOSPHATE 10 MG/ML IJ SOLN
INTRAMUSCULAR | Status: DC | PRN
  Administered 2022-06-18: 10 mg via INTRAVENOUS

## 2022-06-18 SURGICAL SUPPLY — 44 items
BAG COUNTER SPONGE SURGICOUNT (BAG) ×1 IMPLANT
BAND RUBBER #18 3X1/16 STRL (MISCELLANEOUS) ×2 IMPLANT
BENZOIN TINCTURE PRP APPL 2/3 (GAUZE/BANDAGES/DRESSINGS) IMPLANT
BLADE CLIPPER SURG (BLADE) IMPLANT
BUR MATCHSTICK NEURO 3.0 LAGG (BURR) ×1 IMPLANT
BUR PRECISION FLUTE 5.0 (BURR) IMPLANT
CANISTER SUCT 3000ML PPV (MISCELLANEOUS) ×1 IMPLANT
DERMABOND ADVANCED .7 DNX12 (GAUZE/BANDAGES/DRESSINGS) ×1 IMPLANT
DERMABOND ADVANCED .7 DNX6 (GAUZE/BANDAGES/DRESSINGS) IMPLANT
DRAPE LAPAROTOMY 100X72X124 (DRAPES) ×1 IMPLANT
DRAPE MICROSCOPE SLANT 54X150 (MISCELLANEOUS) ×1 IMPLANT
DRAPE SURG 17X23 STRL (DRAPES) ×1 IMPLANT
DURAPREP 26ML APPLICATOR (WOUND CARE) ×1 IMPLANT
ELECT REM PT RETURN 9FT ADLT (ELECTROSURGICAL) ×1
ELECTRODE REM PT RTRN 9FT ADLT (ELECTROSURGICAL) ×1 IMPLANT
GAUZE 4X4 16PLY ~~LOC~~+RFID DBL (SPONGE) IMPLANT
GAUZE SPONGE 4X4 12PLY STRL (GAUZE/BANDAGES/DRESSINGS) IMPLANT
GLOVE ECLIPSE 6.5 STRL STRAW (GLOVE) ×1 IMPLANT
GLOVE EXAM NITRILE XL STR (GLOVE) IMPLANT
GOWN STRL REUS W/ TWL LRG LVL3 (GOWN DISPOSABLE) ×2 IMPLANT
GOWN STRL REUS W/ TWL XL LVL3 (GOWN DISPOSABLE) IMPLANT
GOWN STRL REUS W/TWL 2XL LVL3 (GOWN DISPOSABLE) IMPLANT
GOWN STRL REUS W/TWL LRG LVL3 (GOWN DISPOSABLE) ×2
GOWN STRL REUS W/TWL XL LVL3 (GOWN DISPOSABLE)
KIT BASIN OR (CUSTOM PROCEDURE TRAY) ×1 IMPLANT
KIT TURNOVER KIT B (KITS) ×1 IMPLANT
NDL HYPO 25X1 1.5 SAFETY (NEEDLE) ×1 IMPLANT
NDL SPNL 18GX3.5 QUINCKE PK (NEEDLE) IMPLANT
NEEDLE HYPO 25X1 1.5 SAFETY (NEEDLE) ×1 IMPLANT
NEEDLE SPNL 18GX3.5 QUINCKE PK (NEEDLE) ×1 IMPLANT
NS IRRIG 1000ML POUR BTL (IV SOLUTION) ×1 IMPLANT
PACK LAMINECTOMY NEURO (CUSTOM PROCEDURE TRAY) ×1 IMPLANT
PAD ARMBOARD 7.5X6 YLW CONV (MISCELLANEOUS) ×3 IMPLANT
SPIKE FLUID TRANSFER (MISCELLANEOUS) ×1 IMPLANT
SPONGE SURGIFOAM ABS GEL SZ50 (HEMOSTASIS) ×1 IMPLANT
SPONGE T-LAP 4X18 ~~LOC~~+RFID (SPONGE) IMPLANT
STRIP CLOSURE SKIN 1/2X4 (GAUZE/BANDAGES/DRESSINGS) IMPLANT
SUT VIC AB 0 CT1 18XCR BRD8 (SUTURE) ×1 IMPLANT
SUT VIC AB 0 CT1 8-18 (SUTURE) ×1
SUT VIC AB 2-0 CT1 18 (SUTURE) ×1 IMPLANT
SUT VIC AB 3-0 SH 8-18 (SUTURE) ×1 IMPLANT
TOWEL GREEN STERILE (TOWEL DISPOSABLE) ×1 IMPLANT
TOWEL GREEN STERILE FF (TOWEL DISPOSABLE) ×1 IMPLANT
WATER STERILE IRR 1000ML POUR (IV SOLUTION) ×1 IMPLANT

## 2022-06-18 NOTE — Discharge Summary (Signed)
Physician Discharge Summary  Patient ID: Helen Marshall MRN: [id number] DOB/AGE: [birth year] 76 y.o.  Admit date: 06/18/2022 Discharge date: 06/18/2022  Admission Diagnoses:lateral recess stenosis L3/4 Lumbar stenosis L3/4  Discharge Diagnoses:  Principal Problem:   Lumbar stenosis with neurogenic claudication   Discharged Condition: good  Hospital Course: Helen Marshall was admitted and taken to the operating room for an uncomplicated l3 laminectomy. Post op she is voiding, ambulating, and tolerating a regular diet. Wound is clean, dry, no signs of infection at discharge.   Treatments: surgery: as above  Discharge Exam: Blood pressure (!) 152/74, pulse 76, temperature (!) 97.5 F (36.4 C), resp. rate 20, height '5\' 3"'$  (1.6 m), weight 74.4 kg, last menstrual period 07/21/1988, SpO2 97 %. General appearance: alert, cooperative, appears older than stated age, and no distress  Disposition: Discharge disposition: 01-Home or Self Care      Stenosis of lateral recess of lumbar spine  Allergies as of 06/18/2022       Reactions   Crestor [rosuvastatin] Other (See Comments)   Leg cramping   Demerol [meperidine] Nausea And Vomiting   Latex Other (See Comments)   Redness, hands breakout   Lipitor [atorvastatin] Other (See Comments)   pain   Lovastatin Other (See Comments)   Elevated CK   Oxycodone Nausea And Vomiting   Pravastatin Other (See Comments)   pain   Tramadol Nausea Only   Vicodin [hydrocodone-acetaminophen] Other (See Comments)   Felt weird    Zocor [simvastatin] Other (See Comments)   pain   Gabapentin Rash        Medication List     TAKE these medications    albuterol 108 (90 Base) MCG/ACT inhaler Commonly known as: VENTOLIN HFA Inhale 2 puffs into the lungs every 6 (six) hours as needed for wheezing or shortness of breath.   amLODipine 5 MG tablet Commonly known as: NORVASC TAKE ONE TABLET BY MOUTH EVERY DAY What changed: when to take this    aspirin EC 325 MG tablet Take 325 mg by mouth daily.   benazepril 40 MG tablet Commonly known as: LOTENSIN TAKE 1 TABLET BY MOUTH DAILY   CALCIUM 600 + D PO Take 1 tablet by mouth daily.   ezetimibe 10 MG tablet Commonly known as: ZETIA TAKE 1 TABLET BY MOUTH DAILY What changed: when to take this   fish oil-omega-3 fatty acids 1000 MG capsule Take 2 g by mouth 2 (two) times daily.   fluticasone 50 MCG/ACT nasal spray Commonly known as: FLONASE Place 2 sprays into both nostrils as needed.   GAS-X PO Take 2 tablets by mouth daily as needed (gas).   ibuprofen 200 MG tablet Commonly known as: ADVIL Take 400 mg by mouth every 6 (six) hours as needed for moderate pain.   iVIZIA Dry Eyes 0.5 % Soln Generic drug: Povidone (PF) Place 1 drop into both eyes 2 (two) times daily.   lidocaine 5 % Commonly known as: LIDODERM Place 1 patch onto the skin daily as needed (pain). Remove & Discard patch within 12 hours or as directed by MD   loratadine 10 MG tablet Commonly known as: CLARITIN Take 10 mg by mouth daily as needed for allergies.   MELATONIN PO Take 2 tablets by mouth at bedtime as needed (sleep).   multivitamin tablet Take 1 tablet by mouth daily.   niacin 500 MG tablet Commonly known as: VITAMIN B3 Take 1,000 mg by mouth 2 (two) times daily.   oxyCODONE 5 MG immediate release  tablet Commonly known as: Roxicodone Take 1 tablet (5 mg total) by mouth every 4 (four) hours as needed for severe pain.   tiZANidine 4 MG tablet Commonly known as: ZANAFLEX Take 1 tablet (4 mg total) by mouth every 6 (six) hours as needed for muscle spasms.   traMADol 50 MG tablet Commonly known as: Ultram Take 1 tablet (50 mg total) by mouth every 4 (four) hours as needed.   triamcinolone cream 0.1 % Commonly known as: KENALOG Apply 1 application  topically as needed (psoriasis).   VSL#3 PO Take 1 capsule by mouth daily.        Follow-up Information     Ashok Pall, MD  Follow up.   Specialty: Neurosurgery Why: keep your scheduled appointment Contact information: [address] [place] [address] [phone]                 Signed: Ashok Pall 06/18/2022, 5:52 PM

## 2022-06-18 NOTE — Op Note (Signed)
06/18/2022  4:42 PM  PATIENT:  Helen Marshall  76 y.o. female With lateral recess stenosis and lumbar stenosis PRE-OPERATIVE DIAGNOSIS:  Stenosis of lateral recess of lumbar spine  POST-OPERATIVE DIAGNOSIS:  Stenosis of lateral recess of lumbar spine  PROCEDURE:  Procedure(s): Bilateral L3-4 Lumbar decompression  SURGEON: Surgeon(s): Ashok Pall, MD  ASSISTANTS:none  ANESTHESIA:   general  EBL:  Total I/O In: 1000 [I.V.:1000] Out: 300 [Urine:100; Blood:200]  BLOOD ADMINISTERED:none  CELL SAVER GIVEN:NA  COUNT:per nursing  DRAINS: none   SPECIMEN:  No Specimen  DICTATION: BRYNLIE DAZA was taken to the operating room, intubated, and placed under a general anesthetic without difficulty. She was positioned prone on a Wilson frame with all pressure points padded. Her lumbar region was prepped and draped in a sterile manner.  I infiltrated my planned incision site with lidocaine. I opened the skin with a 10 blade and took the dissection through the subcutaneous tissue to the thoracolumbar fascia. I used the monopolar cautery to expose the lamina of L3 and L4 bilaterally. Once confirmed with intraoperative xray I started the decompression.  I used the drill and Kerrison punches to decompress the spinal canal and the lateral recesses at L3/4. Partial inferior facetectomies of L3 were performed on the left and right sides. The L3 and L4 roots were decompressed in the lateral recesses. I irrigated. I closed the incision with vicryl suture. Hemostasis was obtained prior to closure.  I applied a sterile dressing. The patient was rolled back onto the stretcher and extubated in the [place]: Admit for overnight observation  PATIENT DISPOSITION:  PACU - hemodynamically stable.   Delay start of Pharmacological VTE agent (>24hrs) due to surgical blood loss or risk of bleeding:  yes

## 2022-06-18 NOTE — Anesthesia Procedure Notes (Signed)
Procedure Name: Intubation Date/Time: 06/18/2022 2:26 PM  Performed by: Ester Rink, CRNAPre-anesthesia Checklist: Patient identified, Emergency Drugs available, Suction available and Patient being monitored Patient Re-evaluated:Patient Re-evaluated prior to induction Oxygen Delivery Method: Circle system utilized Preoxygenation: Pre-oxygenation with 100% oxygen Induction Type: IV induction Ventilation: Mask ventilation without difficulty Laryngoscope Size: Mac and 4 Grade View: Grade I Tube type: Oral Tube size: 7.0 mm Number of attempts: 1 Airway Equipment and Method: Stylet and Oral airway Placement Confirmation: ETT inserted through vocal cords under direct vision, positive ETCO2 and breath sounds checked- equal and bilateral Secured at: 22 cm Tube secured with: Tape Dental Injury: Teeth and Oropharynx as per pre-operative assessment

## 2022-06-18 NOTE — Progress Notes (Signed)
Pt with complaint of acid reflux. VO from Dr. Doroteo Glassman for '20mg'$  pepcid IV and oracit 60m PO.

## 2022-06-18 NOTE — Anesthesia Postprocedure Evaluation (Signed)
Anesthesia Post Note  Patient: Helen Marshall  Procedure(s) Performed: Bilateral L3-4 Lumbar decompression (Bilateral)     Patient location during evaluation: PACU Anesthesia Type: General Level of consciousness: sedated Pain management: pain level controlled Vital Signs Assessment: post-procedure vital signs reviewed and stable Respiratory status: spontaneous breathing and respiratory function stable Cardiovascular status: stable Postop Assessment: no apparent nausea or vomiting Anesthetic complications: no   No notable events documented.  Last Vitals:  Vitals:   06/18/22 1645 06/18/22 1700  BP: (!) 151/56 (!) 136/55  Pulse: 76 73  Resp: 11 14  Temp:  36.4 C  SpO2: 95% 95%    Last Pain:  Vitals:   06/18/22 1645  TempSrc:   PainSc: 0-No pain                 Helen Marshall DANIEL

## 2022-06-18 NOTE — Anesthesia Preprocedure Evaluation (Addendum)
Anesthesia Evaluation  Patient identified by MRN, date of birth, ID band Patient awake    Reviewed: Allergy & Precautions, NPO status , Patient's Chart, lab work & pertinent test results  Airway Mallampati: II  TM Distance: >3 FB Neck ROM: Full    Dental  (+) Teeth Intact, Dental Advisory Given   Pulmonary neg pulmonary ROS   Pulmonary exam normal breath sounds clear to auscultation       Cardiovascular hypertension (164/73 in preop, per pt normally 120s SBP), Pt. on medications Normal cardiovascular exam Rhythm:Regular Rate:Normal     Neuro/Psych negative neurological ROS  negative psych ROS   GI/Hepatic Neg liver ROS,GERD  Controlled,,  Endo/Other  diabetes (prediabetic)    Renal/GU negative Renal ROS  negative genitourinary   Musculoskeletal  (+) Arthritis , Osteoarthritis,    Abdominal   Peds  Hematology negative hematology ROS (+)   Anesthesia Other Findings   Reproductive/Obstetrics negative OB ROS                             Anesthesia Physical Anesthesia Plan  ASA: 2  Anesthesia Plan: General   Post-op Pain Management: Tylenol PO (pre-op)*   Induction: Intravenous  PONV Risk Score and Plan: 3 and Ondansetron, Dexamethasone and Treatment may vary due to age or medical condition  Airway Management Planned: Oral ETT  Additional Equipment: None  Intra-op Plan:   Post-operative Plan: Extubation in OR  Informed Consent: I have reviewed the patients History and Physical, chart, labs and discussed the procedure including the risks, benefits and alternatives for the proposed anesthesia with the patient or authorized representative who has indicated his/her understanding and acceptance.     Dental advisory given  Plan Discussed with: CRNA  Anesthesia Plan Comments: (Last airway note for hernia repair 03/2022: Laryngoscope Size: McGraph and 3 Grade View: Grade I Tube  type: Oral Tube size: 7.0 mm Number of attempts: 1 )        Anesthesia Quick Evaluation

## 2022-06-18 NOTE — H&P (Signed)
BP (!) 164/73   Pulse 73   Temp 98.8 F (37.1 C) (Oral)   Resp 18   Ht '5\' 3"'$  (1.6 m)   Wt 74.4 kg   LMP 07/21/1988   SpO2 97%   BMI 29.05 kg/m  Helen Marshall comes in today for evaluation of pain that she has in her back and bilateral lower extremities.  She says both the hip and knees hurt, along with the lower extremities, but she believes that that is probably due to arthritis.     VITAL SIGNS :  Temperature of 97.7.  She is 5 feet 2.5 inches.  She weighs 170 pounds.  Blood pressure is 139/75, pulse 84, respiratory rate 20.  Pain is 4/10.     PHYSICAL EXAMINATION :  She is alert, oriented x4.  She answers all questions appropriately.  Memory, language, attention span, and fund of knowledge are normal.  She has 5/5 strength in the upper and lower extremities.  2+ reflexes biceps, triceps, brachioradialis, knees, and ankles.  Normal muscle tone and bulk.  Pupils equal, round, and reactive to light.  Full extraocular movements.  Full visual fields.  Hearing intact to voice.  Uvula elevates in the midline.  Shoulder shrug is normal.  Tongue protrudes in the midline.  She has normal muscle tone, bulk, and coordination.     Helen Marshall has had supervised medical treatment.  She does have an x-ray which shows a significant amount of spondylitic change which is present in the lower back, especially at L4-5 and at L2-3 and she also has air in the disc space at L5-S1 and at L3-4.  She maintains a normal lordosis.  A good deal of arthritic change and a good deal of facet arthropathy are present.  She has symptoms consistent with neurogenic claudication.  Standing and walking makes it worse  She has neurogenic claudication secondary to spinal stenosis.  She had just received another injection on August 29 and is doing better.  She states that her pain is 2/10.  She has developed a tremor in the right upper extremity and I think she was concerned about Parkinson's, but, again, I told her she does not  have anything resembling Parkinson's.  It is not a pill-rolling tremor.  She does not have a stepping gait, a halting gait.  She does not have masked facies.  She does not have rigidity, so she does not have Parkinson's. OR for decompression.

## 2022-06-18 NOTE — Transfer of Care (Signed)
Immediate Anesthesia Transfer of Care Note  Patient: Helen Marshall  Procedure(s) Performed: Bilateral L3-4 Lumbar decompression (Bilateral)  Patient Location: PACU  Anesthesia Type:General  Level of Consciousness: awake and drowsy  Airway & Oxygen Therapy: Patient Spontanous Breathing  Post-op Assessment: Report given to RN and Post -op Vital signs reviewed and stable  Post vital signs: Reviewed and stable  Last Vitals:  Vitals Value Taken Time  BP 138/57 06/18/22 1615  Temp    Pulse 91 06/18/22 1616  Resp 16 06/18/22 1616  SpO2 100 % 06/18/22 1616  Vitals shown include unvalidated device data.  Last Pain:  Vitals:   06/18/22 0956  TempSrc:   PainSc: 5          Complications: No notable events documented.

## 2022-06-18 NOTE — Discharge Instructions (Addendum)
Wound Care Remove dressing in 3 days Leave incision open to air. You may shower. Do not scrub directly on incision.  Do not put any creams, lotions, or ointments on incision. Activity Walk each and every day, increasing distance each day. No lifting greater than 5 lbs.  Avoid bending, arching, and twisting. No driving for 2 weeks; may ride as a passenger locally.  Diet Resume your normal diet.   Call Your Doctor If Any of These Occur Redness, drainage, or swelling at the wound.  Temperature greater than 101 degrees. Severe pain not relieved by pain medication. Incision starts to come apart. Follow Up Appt Call today for appointment in 3 weeks [phone]) or for problems.         Lumbar Laminectomy Care After A laminectomy is an operation performed on the spine. The purpose is to decompress the spinal cord and/or the nerve roots.  The time in surgery depends on the findings in surgery and what is necessary to correct the problems. HOME CARE INSTRUCTIONS  Check the cut (incision) made by the surgeon twice a day for signs of infection. Some signs of infection may include:  A foul smelling, greenish or yellowish discharge from the wound.  Increased pain.  Increased redness over the incision (operative) site.  The skin edges may separate.  Flu-like symptoms (problems).  A temperature above 101.5 F (38.6 C).  Change your bandages in about 24 to 36 hours following surgery or as directed.  You may shower tomorrow. Avoid bathtubs, swimming pools and hot tubs for three weeks or until your incision has healed completely. If you have stitches or staples, they may be removed 2 to 3 weeks after surgery, or as directed by your doctor. This may be done by your doctor or caregiver.  You may walk as much as you like. No need to exercise at this time. Limit lifting to ~10lbs. Weight reduction may be beneficial if you are overweight.  Daily exercise is helpful to prevent the return of  problems. Walking is permitted. You may use a treadmill without an incline. Cut down on activities and exercise if you have discomfort. You may also go up and down stairs as much as you can tolerate.  DO NOT lift anything heavier than 10 . Avoid bending or twisting at the waist. Always bend your knees when lifting.  Maintain strength and range of motion as instructed.  Do not drive for 2 to 3 weeks, or as directed by your doctors. You may be a passenger for 20 to 30 minute trips. Lying back in the passenger seat may be more comfortable for you. Always wear a seatbelt.  Limit your sitting in a regular chair to 20 to 30 minutes at a time. There are no limitations for sitting in a recliner. You should lie down or walk in between sitting periods.  Only take over-the-counter or prescription medicines for pain, discomfort, or fever as directed by your caregiver.  SEEK MEDICAL CARE IF:  There is increased bleeding (more than a small spot) from the wound.  You notice redness, swelling, or increasing pain in the wound.  Pus is coming from wound.  You develop an unexplained oral temperature above 102 F (38.9 C) develops.  You notice a foul smell coming from the wound or dressing.  You have increasing pain in your wound.  SEEK IMMEDIATE MEDICAL CARE IF:  You develop a rash.  You have difficulty breathing.  You develop any allergic problems to medicines given.

## 2022-06-19 ENCOUNTER — Other Ambulatory Visit: Payer: Self-pay

## 2022-06-19 ENCOUNTER — Encounter (HOSPITAL_COMMUNITY): Payer: Self-pay | Admitting: Neurosurgery

## 2022-06-19 DIAGNOSIS — M48062 Spinal stenosis, lumbar region with neurogenic claudication: Secondary | ICD-10-CM | POA: Diagnosis not present

## 2022-06-19 LAB — CBC
HCT: 37.6 % (ref 36.0–46.0)
Hemoglobin: 12.8 g/dL (ref 12.0–15.0)
MCH: 31.8 pg (ref 26.0–34.0)
MCHC: 34 g/dL (ref 30.0–36.0)
MCV: 93.3 fL (ref 80.0–100.0)
Platelets: 207 10*3/uL (ref 150–400)
RBC: 4.03 MIL/uL (ref 3.87–5.11)
RDW: 13.5 % (ref 11.5–15.5)
WBC: 12.4 10*3/uL — ABNORMAL HIGH (ref 4.0–10.5)
nRBC: 0 % (ref 0.0–0.2)

## 2022-06-19 LAB — CREATININE, SERUM
Creatinine, Ser: 0.65 mg/dL (ref 0.44–1.00)
GFR, Estimated: >60 mL/min (ref >60)

## 2022-06-19 MED ORDER — ONDANSETRON HCL 4 MG PO TABS: 4.0000 mg | ORAL_TABLET | Freq: Every day | ORAL | 1 refills | Status: DC | PRN

## 2022-06-19 NOTE — Evaluation (Signed)
Occupational Therapy Evaluation Patient Details Name: Helen Marshall MRN: [id number] DOB: 05-03-1946 Today's Date: 06/19/2022   History of Present Illness 76 y/o female s/p bilateral L3-4 decompression on 06/18/22. PMH: HTN, hx of breast cancer   Clinical Impression   Pt independent at baseline with ADLs and functional mobility, lives with spouse who can assist at d/c. Pt currently mod I - supervision for ADLs, mod I for bed mobility and mod I for transfers and ambulation in room. Pt educated on precautions and compensatory strategies for ADLs, pt verbalized understanding. Pt presenting with impairments listed below, will follow acutely. Recommend d/c home with family assistance.      Recommendations for follow up therapy are one component of a multi-disciplinary discharge planning process, led by the attending physician.  Recommendations may be updated based on patient status, additional functional criteria and insurance authorization.   Follow Up Recommendations  No OT follow up     Assistance Recommended at Discharge Set up Supervision/Assistance  Patient can return home with the following A little help with walking and/or transfers;A little help with bathing/dressing/bathroom;Help with stairs or ramp for entrance;Assist for transportation;Assistance with cooking/housework    Functional Status Assessment  Patient has had a recent decline in their functional status and demonstrates the ability to make significant improvements in function in a reasonable and predictable amount of time.  Equipment Recommendations  None recommended by OT (pt has all needed DME)    Recommendations for Other Services PT consult     Precautions / Restrictions Precautions Precautions: Back Precaution Booklet Issued: Yes (comment) Required Braces or Orthoses: Other Brace Other Brace: no brace needed per MD Restrictions Weight Bearing Restrictions: No      Mobility Bed Mobility Overal bed  mobility: Modified Independent             General bed mobility comments: use of log roll technique    Transfers Overall transfer level: Modified independent Equipment used: None                      Balance Overall balance assessment: Mild deficits observed, not formally tested                                         ADL either performed or assessed with clinical judgement   ADL Overall ADL's : Needs assistance/impaired Eating/Feeding: Modified independent   Grooming: Supervision/safety   Upper Body Bathing: Supervision/ safety   Lower Body Bathing: Supervison/ safety   Upper Body Dressing : Supervision/safety   Lower Body Dressing: Supervision/safety   Toilet Transfer: Supervision/safety;Ambulation;Regular Museum/gallery exhibitions officer and Hygiene: Supervision/safety       Functional mobility during ADLs: Supervision/safety       Vision   Vision Assessment?: No apparent visual deficits     Perception Perception Perception Tested?: No   Praxis Praxis Praxis tested?: Not tested    Pertinent Vitals/Pain Pain Assessment Pain Assessment: Faces Pain Score: 4  Faces Pain Scale: Hurts little more Pain Location: incision site Pain Descriptors / Indicators: Sore Pain Intervention(s): Limited activity within patient's tolerance, Monitored during session, Repositioned     Hand Dominance     Extremity/Trunk Assessment Upper Extremity Assessment Upper Extremity Assessment: Generalized weakness   Lower Extremity Assessment Lower Extremity Assessment: Defer to PT evaluation   Cervical / Trunk Assessment Cervical / Trunk Assessment: Back Surgery   Communication  Communication Communication: No difficulties   Cognition Arousal/Alertness: Awake/alert Behavior During Therapy: WFL for tasks assessed/performed Overall Cognitive Status: Within Functional Limits for tasks assessed                                        General Comments  VSS On RA, family at bedside    Exercises     Shoulder [place] expects to be discharged to:: Private residence Living Arrangements: Spouse/significant other Available Help at Discharge: Family;Available 24 hours/day Type of Home: House Home Access: Stairs to enter [employer] of Steps: 2   Home Layout: Two level;Laundry or work area in basement;Able to live on main level with bedroom/bathroom     Bathroom Shower/Tub: Tub/shower unit         Home Equipment: Conservation officer, nature (2 wheels);BSC/3in1;Wheelchair - manual          Prior Functioning/Environment Prior Level of Function : Independent/Modified Independent             Mobility Comments: no AD use ADLs Comments: ind        OT Problem List: Decreased strength;Decreased range of motion;Decreased activity tolerance;Impaired balance (sitting and/or standing)      OT Treatment/Interventions: Self-care/ADL training;Therapeutic exercise;Energy conservation;DME and/or AE instruction;Therapeutic activities;Patient/family education;Balance training    OT Goals(Current goals can be found in the care plan section) Acute Rehab OT Goals Patient Stated Goal: none stated OT Goal Formulation: With patient Time For Goal Achievement: 07/03/22 Potential to Achieve Goals: Good  OT Frequency: Min 2X/week    Co-evaluation              AM-PAC OT "6 Clicks" Daily Activity     Outcome Measure Help from another person eating meals?: None Help from another person taking care of personal grooming?: None Help from another person toileting, which includes using toliet, bedpan, or urinal?: A Little Help from another person bathing (including washing, rinsing, drying)?: A Little Help from another person to put on and taking off regular upper body clothing?: None Help from another person to put on and taking off regular lower body clothing?: A  Little 6 Click Score: 21   End of Session Nurse Communication: Mobility status  Activity Tolerance: Patient tolerated treatment well Patient left: in bed;with call bell/phone within reach;with family/visitor present  OT Visit Diagnosis: Unsteadiness on feet (R26.81);Other abnormalities of gait and mobility (R26.89);Muscle weakness (generalized) (M62.81)                Time: 7591-6384 OT Time Calculation (min): 26 min Charges:  OT General Charges $OT Visit: 1 Visit OT Evaluation $OT Eval Low Complexity: 1 Low OT Treatments $Self Care/Home Management : 8-22 mins  Renaye Rakers, OTD, OTR/L SecureChat Preferred Acute Rehab [phone]   Renaye Rakers [place] 06/19/2022, 9:17 AM

## 2022-06-19 NOTE — Progress Notes (Signed)
Patient is discharged from room 3C06 at this time. Alert and in stable condition. IV site d/c'd and instructions read to patient and spouse with understanding verbalized and all questions answered. Left unit via wheelchair with all belongings at side 

## 2022-06-19 NOTE — Evaluation (Signed)
Physical Therapy Evaluation Patient Details Name: Helen Marshall MRN: [id number] DOB: 1946/04/05 Today's Date: 06/19/2022  History of Present Illness  76 y/o female s/p bilateral L3-4 decompression on 06/18/22. PMH: HTN, hx of breast cancer  Clinical Impression  Patient admitted following above procedure. Patient lives with husband who is able to assist at home. Educated patient on back precautions, activity progression, and car transfer, patient verbalized understanding. Patient ambulating at modI level with no AD. Requires min guard-A for stair negotiation with HHAx1 as she does not have rail at home to use. No further skilled PT needs identified acutely. No PT follow up recommended at this time.        Recommendations for follow up therapy are one component of a multi-disciplinary discharge planning process, led by the attending physician.  Recommendations may be updated based on patient status, additional functional criteria and insurance authorization.  Follow Up Recommendations No PT follow up      Assistance Recommended at Discharge PRN  Patient can return home with the following  Help with stairs or ramp for entrance    Equipment Recommendations None recommended by PT  Recommendations for Other Services       Functional Status Assessment Patient has had a recent decline in their functional status and demonstrates the ability to make significant improvements in function in a reasonable and predictable amount of time.     Precautions / Restrictions Precautions Precautions: Back Precaution Booklet Issued: Yes (comment) Restrictions Weight Bearing Restrictions: No      Mobility  Bed Mobility               General bed mobility comments: sitting on EOB on arrival    Transfers Overall transfer level: Modified independent Equipment used: None                    Ambulation/Gait Ambulation/Gait assistance: Modified independent (Device/Increase time) Gait  Distance (Feet): 70 Feet Assistive device: None Gait Pattern/deviations: Step-through pattern, Decreased stride length Gait velocity: decreased     General Gait Details: short steps but patient reporting improve from prior to surgeyr  Stairs Stairs: Yes Stairs assistance: Min guard Stair Management: Step to pattern, Forwards (HHA on R) Number of Stairs: 2 General stair comments: min guard for safety. Patient with use of momentum to ascend stairs. Educated husband on placement to assist.  Wheelchair Mobility    Modified Rankin (Stroke Patients Only)       Balance Overall balance assessment: Mild deficits observed, not formally tested                                           Pertinent Vitals/Pain Pain Assessment Pain Assessment: Faces Faces Pain Scale: Hurts little more Pain Location: incision site Pain Descriptors / Indicators: Sore Pain Intervention(s): Monitored during session    Home Living Family/patient expects to be discharged to:: Private residence Living Arrangements: Spouse/significant other Available Help at Discharge: Family;Available 24 hours/day Type of Home: House Home Access: Stairs to enter   [employer] of Steps: 2   Home Layout: Two level;Laundry or work area in basement;Able to live on main level with bedroom/bathroom Home Equipment: Conservation officer, nature (2 wheels);BSC/3in1;Wheelchair - manual      Prior Function Prior Level of Function : Independent/Modified Independent             Mobility Comments: no AD use ADLs Comments:  ind     Hand Dominance        Extremity/Trunk Assessment   Upper Extremity Assessment Upper Extremity Assessment: Defer to OT evaluation    Lower Extremity Assessment Lower Extremity Assessment: Generalized weakness    Cervical / Trunk Assessment Cervical / Trunk Assessment: Back Surgery  Communication   Communication: No difficulties  Cognition Arousal/Alertness:  Awake/alert Behavior During Therapy: WFL for tasks assessed/performed Overall Cognitive Status: Within Functional Limits for tasks assessed                                          General Comments      Exercises     Assessment/Plan    PT Assessment Patient does not need any further PT services  PT Problem List         PT Treatment Interventions      PT Goals (Current goals can be found in the Care Plan section)  Acute Rehab PT Goals Patient Stated Goal: to go home PT Goal Formulation: All assessment and education complete, DC therapy    Frequency       Co-evaluation               AM-PAC PT "6 Clicks" Mobility  Outcome Measure Help needed turning from your back to your side while in a flat bed without using bedrails?: None Help needed moving from lying on your back to sitting on the side of a flat bed without using bedrails?: None Help needed moving to and from a bed to a chair (including a wheelchair)?: None Help needed standing up from a chair using your arms (e.g., wheelchair or bedside chair)?: None Help needed to walk in hospital room?: None Help needed climbing 3-5 steps with a railing? : A Little 6 Click Score: 23    End of Session   Activity Tolerance: Patient tolerated treatment well Patient left: with family/visitor present;with call bell/phone within reach (sitting EOB) Nurse Communication: Mobility status PT Visit Diagnosis: Muscle weakness (generalized) (M62.81)    Time: 0300-9233 PT Time Calculation (min) (ACUTE ONLY): 10 min   Charges:   PT Evaluation $PT Eval Low Complexity: 1 Low          Roylene Heaton A. Gilford Rile PT, DPT Acute Rehabilitation Services Office [phone]   Linna Hoff 06/19/2022, 9:11 AM

## 2022-06-29 ENCOUNTER — Other Ambulatory Visit (HOSPITAL_COMMUNITY): Payer: Self-pay | Admitting: Neurology

## 2022-06-29 DIAGNOSIS — R413 Other amnesia: Secondary | ICD-10-CM

## 2022-06-29 DIAGNOSIS — R251 Tremor, unspecified: Secondary | ICD-10-CM

## 2022-07-08 ENCOUNTER — Ambulatory Visit
Admission: RE | Admit: 2022-07-08 | Discharge: 2022-07-08 | Disposition: A | Discharge status: Home / Self Care | Payer: Medicare Other | Source: Ambulatory Visit | Attending: Neurology | Admitting: Neurology

## 2022-07-08 DIAGNOSIS — R251 Tremor, unspecified: Secondary | ICD-10-CM | POA: Diagnosis present

## 2022-07-08 DIAGNOSIS — R413 Other amnesia: Secondary | ICD-10-CM | POA: Diagnosis present

## 2022-08-17 ENCOUNTER — Other Ambulatory Visit: Payer: Self-pay | Admitting: Internal Medicine

## 2022-09-16 ENCOUNTER — Other Ambulatory Visit: Payer: Self-pay | Admitting: Internal Medicine

## 2022-09-16 ENCOUNTER — Other Ambulatory Visit (INDEPENDENT_AMBULATORY_CARE_PROVIDER_SITE_OTHER): Payer: Medicare Other

## 2022-09-16 DIAGNOSIS — I10 Essential (primary) hypertension: Secondary | ICD-10-CM

## 2022-09-16 DIAGNOSIS — E7800 Pure hypercholesterolemia, unspecified: Secondary | ICD-10-CM

## 2022-09-16 DIAGNOSIS — R739 Hyperglycemia, unspecified: Secondary | ICD-10-CM | POA: Diagnosis not present

## 2022-09-16 LAB — HEPATIC FUNCTION PANEL
ALT: 18 U/L (ref 0–35)
AST: 23 U/L (ref 0–37)
Albumin: 4 g/dL (ref 3.5–5.2)
Alkaline Phosphatase: 64 U/L (ref 39–117)
Bilirubin, Direct: 0.1 mg/dL (ref 0.0–0.3)
Total Bilirubin: 0.6 mg/dL (ref 0.2–1.2)
Total Protein: 6.3 g/dL (ref 6.0–8.3)

## 2022-09-16 LAB — HEMOGLOBIN A1C: Hgb A1c MFr Bld: 5.8 % (ref 4.6–6.5)

## 2022-09-16 LAB — BASIC METABOLIC PANEL
BUN: 11 mg/dL (ref 6–23)
CO2: 30 mEq/L (ref 19–32)
Calcium: 9.2 mg/dL (ref 8.4–10.5)
Chloride: 104 mEq/L (ref 96–112)
Creatinine, Ser: 0.67 mg/dL (ref 0.40–1.20)
GFR: 84.47 mL/min (ref >60.00)
Glucose, Bld: 95 mg/dL (ref 70–99)
Potassium: 3.7 mEq/L (ref 3.5–5.1)
Sodium: 140 mEq/L (ref 135–145)

## 2022-09-16 LAB — LIPID PANEL
Cholesterol: 213 mg/dL — ABNORMAL HIGH (ref 0–200)
HDL: 76.3 mg/dL (ref >39.00)
LDL Cholesterol: 121 mg/dL — ABNORMAL HIGH (ref 0–99)
NonHDL: 136.8
Total CHOL/HDL Ratio: 3
Triglycerides: 77 mg/dL (ref 0.0–149.0)
VLDL: 15.4 mg/dL (ref 0.0–40.0)

## 2022-09-21 ENCOUNTER — Ambulatory Visit (INDEPENDENT_AMBULATORY_CARE_PROVIDER_SITE_OTHER): Payer: Medicare Other | Admitting: Internal Medicine

## 2022-09-21 ENCOUNTER — Encounter: Payer: Self-pay | Admitting: Internal Medicine

## 2022-09-21 VITALS — BP 130/78 | HR 76 | Temp 97.9°F | Resp 16 | Ht 62.0 in | Wt 164.0 lb

## 2022-09-21 DIAGNOSIS — Z0000 Encounter for general adult medical examination without abnormal findings: Secondary | ICD-10-CM | POA: Diagnosis not present

## 2022-09-21 DIAGNOSIS — D0512 Intraductal carcinoma in situ of left breast: Secondary | ICD-10-CM

## 2022-09-21 DIAGNOSIS — I10 Essential (primary) hypertension: Secondary | ICD-10-CM

## 2022-09-21 DIAGNOSIS — Z1231 Encounter for screening mammogram for malignant neoplasm of breast: Secondary | ICD-10-CM

## 2022-09-21 DIAGNOSIS — R7989 Other specified abnormal findings of blood chemistry: Secondary | ICD-10-CM

## 2022-09-21 DIAGNOSIS — Z853 Personal history of malignant neoplasm of breast: Secondary | ICD-10-CM

## 2022-09-21 DIAGNOSIS — R739 Hyperglycemia, unspecified: Secondary | ICD-10-CM

## 2022-09-21 DIAGNOSIS — E7800 Pure hypercholesterolemia, unspecified: Secondary | ICD-10-CM

## 2022-09-21 DIAGNOSIS — D72829 Elevated white blood cell count, unspecified: Secondary | ICD-10-CM

## 2022-09-21 DIAGNOSIS — M48062 Spinal stenosis, lumbar region with neurogenic claudication: Secondary | ICD-10-CM

## 2022-09-21 DIAGNOSIS — H02836 Dermatochalasis of left eye, unspecified eyelid: Secondary | ICD-10-CM

## 2022-09-21 DIAGNOSIS — R251 Tremor, unspecified: Secondary | ICD-10-CM

## 2022-09-21 DIAGNOSIS — D649 Anemia, unspecified: Secondary | ICD-10-CM

## 2022-09-21 DIAGNOSIS — H02833 Dermatochalasis of right eye, unspecified eyelid: Secondary | ICD-10-CM

## 2022-09-21 MED ORDER — TRIAMCINOLONE ACETONIDE 0.1 % EX CREA: 1.0000 | TOPICAL_CREAM | CUTANEOUS | 0 refills | Status: AC | PRN

## 2022-09-21 MED ORDER — EZETIMIBE 10 MG PO TABS: 10.0000 mg | ORAL_TABLET | Freq: Every day | ORAL | 2 refills | Status: DC

## 2022-09-21 MED ORDER — AMLODIPINE BESYLATE 5 MG PO TABS: 5.0000 mg | ORAL_TABLET | Freq: Every day | ORAL | 3 refills | Status: DC

## 2022-09-21 NOTE — Assessment & Plan Note (Signed)
Physical today 09/21/22.  Mammogram 09/2021.  Colonoscopy 06/29/21 - One 4 mm polyp in the cecum. One 2 mm polyp in the ascending colon. (polyp in the cecum). (tubular adenomas).  Moderate diverticulosis in the sigmoid colon. The distal rectum and anal verge are normal on retroflexion view.

## 2022-09-21 NOTE — Progress Notes (Signed)
Subjective:    Patient ID: Helen Marshall, female    DOB: 12-29-1945, 77 y.o.   MRN: [id number]  Patient here for  Chief Complaint  Patient presents with   Annual Exam    HPI Here for a physical exam.  Reports she is doing relatively well. Does report concern regarding persistent tremor.  MRI - 07/08/22 - no acute abnormality.  Request to see Dr Tat.  No chest pain or sob reported.  No increased cough or congestion.  No abdominal pain or bowel change reported.  Is s/p ventral hernia repair.  S/p recent eyelid surgery.    Past Medical History:  Diagnosis Date   Abnormal LFTs    Abnormal mammogram 12/19/2012   left   Allergy    Anemia    Arthritis    Cancer 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   Pre-diabetes    Tubular adenoma of colon    Umbilical hernia [id number]   Past Surgical History:  Procedure Laterality Date   ABDOMINAL HYSTERECTOMY  1989   fibroids   ANKLE SURGERY Left 1999   ANTERIOR AND POSTERIOR REPAIR WITH SACROSPINOUS FIXATION N/A 07/20/2021   Procedure: ANTERIOR AND REPAIR WITH SACROSPINOUS FIXATION;  Surgeon: Jaquita Folds, MD;  Location: [facility];  Service: Gynecology;  Laterality: N/A;   BREAST SURGERY Left 1970   biopsy   CATARACT EXTRACTION, BILATERAL  June & July 2017    CHOLECYSTECTOMY  2001   COLONOSCOPY  06/02/2011   Verdie Shire, MD; diverticulosis, submucosal lipoma of the sigmoid colon.   CYSTOSCOPY N/A 07/20/2021   Procedure: CYSTOSCOPY;  Surgeon: Jaquita Folds, MD;  Location: [facility];  Service: Gynecology;  Laterality: N/A;   eyelid lift Bilateral    GALLBLADDER SURGERY     HERNIA REPAIR  2001, 2004   HERNIA REPAIR  01/22/2013   Repeat repair of umbilical defect, 2.5 cm. Primary repair.   INSERTION OF MESH N/A 03/31/2022   Procedure: INSERTION OF MESH;  Surgeon: Robert Bellow, MD;  Location: [facility];  Service: General;  Laterality: N/A;   LAPAROSCOPIC HYSTERECTOMY     LUMBAR LAMINECTOMY/DECOMPRESSION MICRODISCECTOMY Bilateral 06/18/2022   Procedure: Bilateral L3-4 Lumbar decompression;  Surgeon: Ashok Pall, MD;  Location: [facility];  Service: Neurosurgery;  Laterality: Bilateral;   NASAL SINUS SURGERY  1997   rotator cuff surgery  1998   SQUAMOUS CELL CARCINOMA EXCISION Right 05/2013   shoulder   TUBAL LIGATION  1976   UPPER GI ENDOSCOPY  08/22/2014   Dr Lenna Sciara. Hilarie Fredrickson   VENTRAL HERNIA REPAIR N/A 03/31/2022   Procedure: HERNIA REPAIR VENTRAL ADULT;  Surgeon: Robert Bellow, MD;  Location: [facility];  Service: General;  Laterality: N/A;  Floyce Stakes, RNFA to assist TAPP block per anesthesia   Family History  Problem Relation Age of Onset   CVA Father    Alcohol abuse Father    Heart disease Mother        myocardial infarction   Diabetes Mother    Hearing loss Mother    Hypertension Brother        x2   Arthritis Brother    Hearing loss Brother    Alcohol abuse Brother    Arthritis Brother    Hearing loss Brother    Arthritis/Rheumatoid Sister        x2   Breast cancer Sister 77   Arthritis Sister    Hearing loss Sister    Arthritis Sister    Hearing loss Sister    Asthma Sister    Arthritis Sister    Hearing loss Sister    Hypothyroidism Sister    Arthritis Sister    Hearing loss Sister    Cancer  Other        breast - paternal first cousin   Colon cancer Maternal Uncle    Colon polyps Son    Colon polyps Sister    Arthritis Sister    Hearing loss Sister    Social History   Socioeconomic History   Marital status: Married    Spouse name: ,Nathaneil Canary   Number of children: 2   Years of education: Not on file   Highest education level: Not on file  Occupational History   Occupation: Retired  Tobacco Use   Smoking status: Never   Smokeless tobacco: Never  Vaping Use   Vaping Use: Never used  Substance and Sexual Activity   Alcohol use: No    Alcohol/week: 0.0 standard drinks of alcohol   Drug use: No   Sexual activity: Not Currently  Other Topics Concern   Not on file  Social History Narrative   Not on file   Social Determinants of Health   Financial Resource Strain: Low Risk  (01/04/2022)   Overall Financial Resource Strain (CARDIA)    Difficulty of Paying Living Expenses: Not hard at all  Food Insecurity: No Food Insecurity (06/19/2022)   Hunger Vital Sign    Worried About Running Out of Food in the Last Year: Never true    Ran Out of Food in the Last Year: Never true  Transportation Needs: No Transportation Needs (06/19/2022)   PRAPARE - Hydrologist (Medical): No    Lack of Transportation (Non-Medical): No  Physical Activity: Unknown (08/10/2018)   Exercise Vital Sign    Days of Exercise per Week: 0 days    Minutes of Exercise per Session: Not on file  Stress: No Stress Concern Present (01/04/2022)   [place]    Feeling of Stress : Not at all  Social Connections: Unknown (01/04/2022)   Social Connection and Isolation Panel [NHANES]    Frequency of Communication with Friends and Family: Once a week    Frequency of Social Gatherings with Friends and Family: Once a week    Attends Religious Services: Not on Advertising copywriter or Organizations: Not on file     Attends Archivist Meetings: Not on file    Marital Status: Married     Review of Systems  Constitutional:  Negative for appetite change and unexpected weight change.  HENT:  Negative for congestion, sinus  pressure and sore throat.   Eyes:  Negative for pain and visual disturbance.  Respiratory:  Negative for cough, chest tightness and shortness of breath.   Cardiovascular:  Negative for chest pain and palpitations.  Gastrointestinal:  Negative for abdominal pain, diarrhea, nausea and vomiting.  Genitourinary:  Negative for difficulty urinating and dysuria.  Musculoskeletal:  Negative for back pain and joint swelling.  Skin:  Negative for color change and rash.  Neurological:  Negative for dizziness and headaches.  Hematological:  Negative for adenopathy. Does not bruise/bleed easily.  Psychiatric/Behavioral:  Negative for agitation and dysphoric mood.        Objective:     BP 130/78   Pulse 76   Temp 97.9 F (36.6 C)   Resp 16   Ht '5\' 2"'$  (1.575 m)   Wt 164 lb (74.4 kg)   LMP 07/21/1988   SpO2 97%   BMI 30.00 kg/m  Wt Readings from Last 3 Encounters:  09/21/22 164 lb (74.4 kg)  06/18/22 164 lb (74.4 kg)  05/21/22 170 lb 3.2 oz (77.2 kg)    Physical Exam Vitals reviewed.  Constitutional:      General: She is not in acute distress.    Appearance: Normal appearance. She is well-developed.  HENT:     Head: Normocephalic and atraumatic.     Right Ear: External ear normal.     Left Ear: External ear normal.  Eyes:     General: No scleral icterus.       Right eye: No discharge.        Left eye: No discharge.     Conjunctiva/sclera: Conjunctivae normal.  Neck:     Thyroid: No thyromegaly.  Cardiovascular:     Rate and Rhythm: Normal rate and regular rhythm.  Pulmonary:     Effort: No tachypnea, accessory muscle usage or respiratory distress.     Breath sounds: Normal breath sounds. No decreased breath sounds or wheezing.  Chest:  Breasts:    Right: No  inverted nipple, mass, nipple discharge or tenderness (no axillary adenopathy).     Left: No inverted nipple, mass, nipple discharge or tenderness (no axilarry adenopathy).  Abdominal:     General: Bowel sounds are normal.     Palpations: Abdomen is soft.     Tenderness: There is no abdominal tenderness.  Musculoskeletal:        General: No swelling or tenderness.     Cervical back: Neck supple.  Lymphadenopathy:     Cervical: No cervical adenopathy.  Skin:    Findings: No erythema or rash.  Neurological:     Mental Status: She is alert and oriented to person, place, and time.  Psychiatric:        Mood and Affect: Mood normal.        Behavior: Behavior normal.      Outpatient Encounter Medications as of 09/21/2022  Medication Sig   albuterol (VENTOLIN HFA) 108 (90 Base) MCG/ACT inhaler Inhale 2 puffs into the lungs every 6 (six) hours as needed for wheezing or shortness of breath.   amLODipine (NORVASC) 5 MG tablet Take 1 tablet (5 mg total) by mouth daily.   aspirin EC 325 MG tablet Take 325 mg by mouth daily.   benazepril (LOTENSIN) 40 MG tablet TAKE 1 TABLET BY MOUTH DAILY   Calcium Carb-Cholecalciferol (CALCIUM 600 + D PO) Take 1 tablet by mouth daily.   ezetimibe (ZETIA) 10 MG tablet Take 1 tablet (10 mg total) by mouth daily.  fish oil-omega-3 fatty acids 1000 MG capsule Take 2 g by mouth 2 (two) times daily.   fluticasone (FLONASE) 50 MCG/ACT nasal spray Place 2 sprays into both nostrils as needed.   ibuprofen (ADVIL) 200 MG tablet Take 400 mg by mouth every 6 (six) hours as needed for moderate pain.   lidocaine (LIDODERM) 5 % Place 1 patch onto the skin daily as needed (pain). Remove & Discard patch within 12 hours or as directed by MD   loratadine (CLARITIN) 10 MG tablet Take 10 mg by mouth daily as needed for allergies.   MELATONIN PO Take 2 tablets by mouth at bedtime as needed (sleep).   Multiple Vitamin (MULTIVITAMIN) tablet Take 1 tablet by mouth daily.   niacin  (VITAMIN B3) 500 MG tablet Take 1,000 mg by mouth 2 (two) times daily.   ondansetron (ZOFRAN) 4 MG tablet Take 1 tablet (4 mg total) by mouth daily as needed for nausea or vomiting.   Povidone, PF, (IVIZIA DRY EYES) 0.5 % SOLN Place 1 drop into both eyes 2 (two) times daily.   Probiotic Product (VSL#3 PO) Take 1 capsule by mouth daily.   Simethicone (GAS-X PO) Take 2 tablets by mouth daily as needed (gas).   tiZANidine (ZANAFLEX) 4 MG tablet Take 1 tablet (4 mg total) by mouth every 6 (six) hours as needed for muscle spasms.   traMADol (ULTRAM) 50 MG tablet Take 1 tablet (50 mg total) by mouth every 4 (four) hours as needed.   triamcinolone cream (KENALOG) 0.1 % Apply 1 Application topically as needed (psoriasis).   [DISCONTINUED] amLODipine (NORVASC) 5 MG tablet TAKE ONE TABLET BY MOUTH EVERY DAY (Patient taking differently: Take 5 mg by mouth at bedtime.)   [DISCONTINUED] ezetimibe (ZETIA) 10 MG tablet TAKE 1 TABLET BY MOUTH DAILY   [DISCONTINUED] oxyCODONE (ROXICODONE) 5 MG immediate release tablet Take 1 tablet (5 mg total) by mouth every 4 (four) hours as needed for severe pain.   [DISCONTINUED] triamcinolone cream (KENALOG) 0.1 % Apply 1 application  topically as needed (psoriasis).   No facility-administered encounter medications on file as of 09/21/2022.     Lab Results  Component Value Date   WBC 12.4 (H) 06/19/2022   HGB 12.8 06/19/2022   HCT 37.6 06/19/2022   PLT 207 06/19/2022   GLUCOSE 95 09/16/2022   CHOL 213 (H) 09/16/2022   TRIG 77.0 09/16/2022   HDL 76.30 09/16/2022   LDLDIRECT 101.4 05/07/2013   LDLCALC 121 (H) 09/16/2022   ALT 18 09/16/2022   AST 23 09/16/2022   NA 140 09/16/2022   K 3.7 09/16/2022   CL 104 09/16/2022   CREATININE 0.67 09/16/2022   BUN 11 09/16/2022   CO2 30 09/16/2022   TSH 1.95 05/18/2022   HGBA1C 5.8 09/16/2022   MICROALBUR <0.7 04/19/2018    MR BRAIN WO CONTRAST  Result Date: 07/10/2022 CLINICAL DATA:  Tremor on the right side of the  body. EXAM: MRI HEAD WITHOUT CONTRAST TECHNIQUE: Multiplanar, multiecho pulse sequences of the brain and surrounding structures were obtained without intravenous contrast. COMPARISON:  None Available. FINDINGS: Brain: No acute infarction, hemorrhage, hydrocephalus, extra-axial collection or mass lesion. Vascular: Normal flow voids. Skull and upper cervical spine: Normal marrow signal. Sinuses/Orbits: Bilateral lens replacement. Polypoid mucosal thickening bilateral maxillary sinuses. Other: None. IMPRESSION: No definite finding to explain right-sided hand tremors. Electronically Signed   By: Helen Roberts M.D.   On: 07/10/2022 13:58       Assessment & Plan:  Routine general medical  examination at a health care facility  Hyperglycemia Assessment & Plan: Low carb diet and exercise.  Follow met b and a1c.   Lab Results  Component Value Date   HGBA1C 5.8 09/16/2022    Orders: -     Hemoglobin A1c; Future  Hypercholesterolemia Assessment & Plan: Intolerant to statin medication.  On zetia.  Low cholesterol diet and exercise.  Follow lipid panel and liver function tests.    Orders: -     Lipid panel; Future -     Hepatic function panel; Future -     Basic metabolic panel; Future  Visit for screening mammogram -     3D Screening Mammogram, Left and Right; Future  Health care maintenance Assessment & Plan: Physical today 09/21/22.  Mammogram 09/2021.  Colonoscopy 06/29/21 - One 4 mm polyp in the cecum. One 2 mm polyp in the ascending colon. (polyp in the cecum). (tubular adenomas).  Moderate diverticulosis in the sigmoid colon. The distal rectum and anal verge are normal on retroflexion view.   Abnormal liver function test Assessment & Plan: Diet and exercise.  Follow liver panel.    Anemia, unspecified type Assessment & Plan: Follow cbc.    Dermatochalasis of eyelids of both eyes, unspecified eyelid Assessment & Plan: S/p bilateral functional upper eyelid blepharoplasties  05/18/22.     History of breast cancer Assessment & Plan: Mammogram 09/18/21 - birads I.  Saw Dr Bary Castilla.  Recommended continuing annual screening mammogram through PCP.    Primary hypertension Assessment & Plan: Continue amlodipine, benazepril.  Blood pressure has been under good control on this regimen.  Follow pressures.  Follow metabolic panel.     Lumbar stenosis with neurogenic claudication Assessment & Plan: 06/18/22 - Dr Christella Noa - Bilateral L3-4 Lumbar decompression    Neoplasm of left breast, primary tumor 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) Assessment & Plan:  Has seen Dr Bary Castilla - f/u breast cancer.  Due f/u screening mammogram 09/2022.   Tremor Assessment & Plan: Does report concern regarding persistent tremor.  MRI - 07/08/22 - no acute abnormality.  Request to see Dr Tat.   Orders: -     Ambulatory referral to Neurology  Leukocytosis, unspecified type Assessment & Plan: Last lab - cbc elevated - white blood cell count.  Recheck cbc with next labs to confirm wnl.   Orders: -     CBC with Differential/Platelet; Future  Other orders -     Ezetimibe; Take 1 tablet (10 mg total) by mouth daily.  Dispense: 90 tablet; Refill: 2 -     amLODIPine Besylate; Take 1 tablet (5 mg total) by mouth daily.  Dispense: 90 tablet; Refill: 3 -     Triamcinolone Acetonide; Apply 1 Application topically as needed (psoriasis).  Dispense: 30 g; Refill: 0     Einar Pheasant, MD

## 2022-09-28 ENCOUNTER — Encounter: Payer: Self-pay | Admitting: Internal Medicine

## 2022-09-28 DIAGNOSIS — D72829 Elevated white blood cell count, unspecified: Secondary | ICD-10-CM | POA: Insufficient documentation

## 2022-09-28 NOTE — Assessment & Plan Note (Signed)
Last lab - cbc elevated - white blood cell count.  Recheck cbc with next labs to confirm wnl.

## 2022-09-28 NOTE — Assessment & Plan Note (Signed)
S/p bilateral functional upper eyelid blepharoplasties 05/18/22.

## 2022-09-28 NOTE — Assessment & Plan Note (Signed)
Does report concern regarding persistent tremor.  MRI - 07/08/22 - no acute abnormality.  Request to see Dr Tat.

## 2022-09-28 NOTE — Assessment & Plan Note (Signed)
Follow cbc.  

## 2022-09-28 NOTE — Assessment & Plan Note (Signed)
06/18/22 - Dr Christella Noa - Bilateral L3-4 Lumbar decompression

## 2022-09-28 NOTE — Assessment & Plan Note (Signed)
Intolerant to statin medication.  On zetia.  Low cholesterol diet and exercise.  Follow lipid panel and liver function tests.

## 2022-09-28 NOTE — Assessment & Plan Note (Signed)
Mammogram 09/18/21 - birads I.  Saw Dr Bary Castilla.  Recommended continuing annual screening mammogram through PCP.

## 2022-09-28 NOTE — Assessment & Plan Note (Signed)
Has seen Dr Bary Castilla - f/u breast cancer.  Due f/u screening mammogram 09/2022.

## 2022-09-28 NOTE — Assessment & Plan Note (Signed)
Continue amlodipine, benazepril.  Blood pressure has been under good control on this regimen.  Follow pressures.  Follow metabolic panel.

## 2022-09-28 NOTE — Assessment & Plan Note (Signed)
Diet and exercise.  Follow liver panel.

## 2022-09-28 NOTE — Assessment & Plan Note (Signed)
Low carb diet and exercise.  Follow met b and a1c.   Lab Results  Component Value Date   HGBA1C 5.8 09/16/2022

## 2022-09-29 NOTE — Progress Notes (Unsigned)
Assessment/Plan:   1.  Tremor predominant idiopathic Parkinson's disease.    -We discussed the diagnosis as well as pathophysiology of the disease.  We discussed treatment options as well as prognostic indicators.  Patient education was provided.  -We decided to add carbidopa/levodopa 25/100.  1/2 tab tid x 1 wk, then 1/2 in am & noon & 1 at night for a week, then 1/2 in am &1 at noon &night for a week, then 1 po tid.  Risks, benefits, side effects and alternative therapies were discussed.  The opportunity to ask questions was given and they were answered to the best of my ability.  The patient expressed understanding and willingness to follow the outlined treatment protocols.  -I will refer the patient to the Parkinson's program at the [facility], for PT. We talked about the importance of safe, cardiovascular exercise in Parkinson's disease.  -We discussed community resources in the area including patient support groups and community exercise programs for PD and pt education was provided to the patient.  2.  Adjustment disorder/grief reaction  -Patient's daughter just passed away about a week ago due to stroke.  Patient is rightfully grieving.  She otherwise does not really consider herself to be depressed.  Counseling for adjustment to the new diagnosis as well as grief counseling could certainly be of value.  3.  Back pain  -Improving.  Patient is status post recent lumbar laminectomy with Dr. Christella Noa in November, 2023.   Subjective:   Helen Marshall was seen today in the movement disorders clinic for neurologic consultation at the request of Einar Pheasant, MD.  The consultation is for the evaluation of tremor.  Patient is seen as a second opinion.  She was seen first by the PA at the [facility] in November.  That was her only visit with them.  She was diagnosed with essential tremor.  MRI brain was ordered.  No treatment was initiated.  I personally reviewed her MRI of  the brain from July 08, 2022.  It was unremarkable.  She presents today for second opinion.  As a side note, the patient did have L3 laminectomy with Dr. Christella Noa in November, 2023.  She states that she is doing much better in that regard  Tremor: Yes.     How long has it been going on? 1-2 years ago started in R hand; started in R leg not quite a year ago; started in the L hand few months ago.  Has mouth tremor but not sure how long ago it started  At rest or with activation?  Rest but has it with activation  Fam hx of tremor?  Yes.  , brother with tremor but no known dx  Affected by caffeine:  No.  Affected by alcohol:  doesn't drink any  Affected by stress:  No.  Affected by fatigue:  No.  Spills soup if on spoon:  No.  Spills glass of liquid if full:  No.   Other Specific Symptoms:  Voice: no change Sleep: sleeps well usually  Vivid Dreams:  No.  Acting out dreams:  rarely yells out Wet Pillows: No. Postural symptoms:  better since surgery a little  Falls?  No. Bradykinesia symptoms: difficulty getting out of a chair (attributes to knee issue); takes small steps and thinks due to fear of falling;  Loss of smell:  Yes.   Loss of taste:  Yes.   Urinary Incontinence:  some frequency and urgency Difficulty Swallowing:  No. Handwriting, micrographia:  No. Trouble with ADL's:  No.  Trouble buttoning clothing: a little troublesome Depression:  Yes.  , daughter just died from stroke so has adjustment d/o but generally not depressed (daughter died 1 week ago) Memory changes:  no different than others her age Hallucinations:  No.  visual distortions: No. N/V:  No. Lightheaded:  No.  Syncope: No. Diplopia:  No. Dyskinesia:  No.   PREVIOUS MEDICATIONS: none to date  ALLERGIES:   Allergies  Allergen Reactions   Crestor [Rosuvastatin] Other (See Comments)    Leg cramping    Demerol [Meperidine] Nausea And Vomiting   Latex Other (See Comments)    Redness, hands breakout    Lipitor [Atorvastatin] Other (See Comments)    pain    Lovastatin Other (See Comments)    Elevated CK   Oxycodone Nausea And Vomiting   Pravastatin Other (See Comments)    pain   Tramadol Nausea Only   Vicodin [Hydrocodone-Acetaminophen] Other (See Comments)    Felt weird    Zocor [Simvastatin] Other (See Comments)    pain    Gabapentin Rash    CURRENT MEDICATIONS:  Current Outpatient Medications  Medication Instructions   albuterol (VENTOLIN HFA) 108 (90 Base) MCG/ACT inhaler 2 puffs, Inhalation, Every 6 hours PRN   amLODipine (NORVASC) 5 mg, Oral, Daily   aspirin EC 325 mg, Oral, Daily   benazepril (LOTENSIN) 40 MG tablet TAKE 1 TABLET BY MOUTH DAILY   Calcium Carb-Cholecalciferol (CALCIUM 600 + D PO) 1 tablet, Oral, Daily   ezetimibe (ZETIA) 10 mg, Oral, Daily   fish oil-omega-3 fatty acids 2 g, Oral, 2 times daily   fluticasone (FLONASE) 50 MCG/ACT nasal spray 2 sprays, Each Nare, As needed   ibuprofen (ADVIL) 400 mg, Oral, Every 6 hours PRN   lidocaine (LIDODERM) 5 % 1 patch, Transdermal, Daily PRN, Remove & Discard patch within 12 hours or as directed by MD   loratadine (CLARITIN) 10 mg, Oral, Daily PRN   MELATONIN PO 2 tablets, Oral, At bedtime PRN   Multiple Vitamin (MULTIVITAMIN) tablet 1 tablet, Oral, Daily   niacin (VITAMIN B3) 1,000 mg, Oral, 2 times daily   Povidone, PF, (IVIZIA DRY EYES) 0.5 % SOLN 1 drop, Both Eyes, 2 times daily   Probiotic Product (VSL#3 PO) 1 capsule, Oral, Daily   Simethicone (GAS-X PO) 2 tablets, Oral, Daily PRN   tiZANidine (ZANAFLEX) 4 mg, Oral, Every 6 hours PRN   traMADol (ULTRAM) 50 mg, Oral, Every 4 hours PRN   triamcinolone cream (KENALOG) 0.1 % 1 Application, Topical, As needed    Objective:   PHYSICAL EXAMINATION:    VITALS:   Vitals:   09/30/22 0929  BP: 138/74  Pulse: 74  SpO2: 98%  Weight: 166 lb (75.3 kg)  Height: '5\' 2"'$  (1.575 m)    GEN:  The patient appears stated age and is in NAD. HEENT:  Normocephalic,  atraumatic.  The mucous membranes are moist. The superficial temporal arteries are without ropiness or tenderness. CV:  RRR Lungs:  CTAB Neck/HEME:  There are no carotid bruits bilaterally.  Neurological examination:  Orientation: The patient is alert and oriented x3.  Cranial nerves: There is good facial symmetry.  Extraocular muscles are intact. The visual fields are full to confrontational testing. The speech is fluent and clear. Soft palate rises symmetrically and there is no tongue deviation. Hearing is intact to conversational tone. Sensation: Sensation is intact to light touch throughout (facial, trunk, extremities). Vibration is intact at the bilateral big toe.  There is no extinction with double simultaneous stimulation.  Motor: Strength is 5/5 in the bilateral upper and lower extremities.   Shoulder shrug is equal and symmetric.  There is no pronator drift. Deep tendon reflexes: Deep tendon reflexes are 0-1/4 at the bilateral biceps, triceps, brachioradialis, patella and achilles. Plantar responses are downgoing bilaterally.  Movement examination: Tone: There is nl tone in the bilateral upper extremities.  The tone in the lower extremities is nl.  Abnormal movements: there is RUE>RUE rest tremor.  There is LUE rest tremor.  Tremor increases with activation.  There is chin tremor Coordination:  There is  decremation with RAM's, with any form of RAMS, including alternating supination and pronation of the forearm, hand opening and closing, finger taps, heel taps and toe taps on the R Gait and Station: The patient has minimal difficulty arising out of a deep-seated chair without the use of the hands. The patient's stride length is just slightly short.   I have reviewed and interpreted the following labs independently   Chemistry      Component Value Date/Time   NA 140 09/16/2022 0931   K 3.7 09/16/2022 0931   CL 104 09/16/2022 0931   CO2 30 09/16/2022 0931   BUN 11 09/16/2022 0931    CREATININE 0.67 09/16/2022 0931      Component Value Date/Time   CALCIUM 9.2 09/16/2022 0931   ALKPHOS 64 09/16/2022 0931   AST 23 09/16/2022 0931   ALT 18 09/16/2022 0931   BILITOT 0.6 09/16/2022 0931      Lab Results  Component Value Date   TSH 1.95 05/18/2022   Lab Results  Component Value Date   WBC 12.4 (H) 06/19/2022   HGB 12.8 06/19/2022   HCT 37.6 06/19/2022   MCV 93.3 06/19/2022   PLT 207 06/19/2022      Total time spent on today's visit was 60 minutes, including both face-to-face time and nonface-to-face time.  Time included that spent on review of records (prior notes available to me/labs/imaging if pertinent), discussing treatment and goals, answering patient's questions and coordinating care.  Cc:  Einar Pheasant, MD

## 2022-09-30 ENCOUNTER — Ambulatory Visit (INDEPENDENT_AMBULATORY_CARE_PROVIDER_SITE_OTHER): Payer: Medicare Other | Admitting: Neurology

## 2022-09-30 ENCOUNTER — Encounter: Payer: Self-pay | Admitting: Internal Medicine

## 2022-09-30 ENCOUNTER — Encounter: Payer: Self-pay | Admitting: Neurology

## 2022-09-30 VITALS — BP 138/74 | HR 74 | Ht 62.0 in | Wt 166.0 lb

## 2022-09-30 DIAGNOSIS — F4321 Adjustment disorder with depressed mood: Secondary | ICD-10-CM | POA: Diagnosis not present

## 2022-09-30 DIAGNOSIS — G20A1 Parkinson's disease without dyskinesia, without mention of fluctuations: Secondary | ICD-10-CM

## 2022-09-30 MED ORDER — CARBIDOPA-LEVODOPA 25-100 MG PO TABS: 1.0000 | ORAL_TABLET | Freq: Three times a day (TID) | ORAL | 1 refills | Status: DC

## 2022-09-30 NOTE — Patient Instructions (Addendum)
Start Carbidopa Levodopa as follows: Take 1/2 tablet three times daily, at least 30 minutes before meals (approximately 7am/11am/4pm), for one week Then take 1/2 tablet in the morning, 1/2 tablet in the afternoon, 1 tablet in the evening, at least 30 minutes before meals, for one week Then take 1/2 tablet in the morning, 1 tablet in the afternoon, 1 tablet in the evening, at least 30 minutes before meals, for one week Then take 1 tablet three times daily at 7am/11am/4pm, at least 30 minutes before meals   As a reminder, carbidopa/levodopa can be taken at the same time as a carbohydrate, but we like to have you take your pill either 30 minutes before a protein source or 1 hour after as protein can interfere with carbidopa/levodopa absorption.  Local and Online Resources for Power over Parkinson's Group  February 2024   LOCAL [place] PARKINSON'S GROUPS   Power over Parkinson's Group:    Power Over Parkinson's Patient Education Group will be Wednesday, February 14th-*Hybrid meting*- in person at [facility] location and via [facility], 2:00-3:00 pm.   Starting in November, Power over [employer] and Care Partner Groups will meet together, with plans for separate break out session for caregivers (*this will be evolving over the next few months) Upcoming Power over Parkinson's Meetings/Care Partner Support:  2nd Wednesdays of the month at 2 pm:  February 14th, March 13th  [place] at [email or web address] if interested in participating in this group    [place]! Moves Classes in [place]!  Starting August 26, 2022.  Thursdays, January 25-October 07, 2022 at 11:45 am.  [facility].  Free to participate (through grant with [employer]) and registration required.  Please contact Corwin Levins at [email or web address] to register. Let's Try Pickleball-$25 for 6 weeks of Pickleball, starting February 2nd.  Contact Corwin Levins for more details.  [email or web address] Parkinson's Community Game Night at [facility], Date TBA in Late February, email Sarah for details [email or web address] THRIVE, an exercise group for early-onset Parkinson's Disease will resume meeting in February, email Sarah for details [email or web address] Parkinson's CarePartner Group for Men is in the works, if interested email [email or web address] ACT FITNESS Chair Yoga classes "Train and Gain", Fridays 10 am, ACT [employer]itness.  Contact Gina at [phone].  PWR! Moves Dynegy Instructor-Led Classes offering at [employer]!  TUESDAYS and Wednesdays 1-2 pm.   Contact Vonna Kotyk at  [email or web address]  or [phone] (Tuesday classes are modified for chair and standing only) Drumming for Parkinson's will be held on 2nd and 4th Mondays at 11:00 am.   Located at the [place] ([place]  Contact Doylene Canning at [email or web address] or [phone]  Dance for Parkinson 's classes will be on Tuesdays 9:30am-10:30am starting back in February. Located in the [employer] , in the first floor of the [employer] ([place] To register:  [email or web address] or [place] Class, Mondays at 11 am.  Call [phone] for details SAVE THE DATE and REGISTER:  [place] Chapter of [place]:  [employer].  Conversations about Parkinson's.  Saturday, October 02, 2022, 9:00 am-2:00 pm.  [place], *In person or online via [place]*.  Register at [email or web address] or call Beverlee Nims at [phone].  [place]:  [email or web address]  PD Health at Home continues:  Mindfulness Mondays, Wellness Wednesdays, Fitness Fridays   Upcoming  Education:   Parkinson's 101.  Wednesday,  February 7th, 1-2 pm The Changing Landscape of Intimacy.  Wednesday, February 14th, 1-2 pm New to Parkinson's:  Care Partner Basics.  Wednesday, February 28th, 1-2 pm Expert Briefing:  Understanding Pain in Parkinson's.   Wednesday, April 10th,  1-2 pm  Register for virtual education and Patent attorney (webinars) at [email or web address] Please check out their website to sign up for emails and see their full online offerings      [place]:  [email or web address]   Third Thursday Webinars:  On the third Thursday of every month at 12 p.m. ET, join our free live webinars to learn about various aspects of living with Parkinson's disease and our work to speed medical breakthroughs.  Upcoming Webinar:  Therapies for Tomorrow:  How Better Clinical Trial Design Leads to Better Treatments.  Thursday, February 15th at 12 noon. Check out additional information on their website to see their full online offerings    [facility]:  [email or web address]  Upcoming Webinar:   [place].  Thursday, February 8th, 12 noon Webinar Series:  Living with Parkinson's Meetup.   Third Thursdays each month, 3 pm  Care Partner Monthly Meetup.  With Robin Searing Phinney.  First Tuesday of each month, 2 pm  Check out additional information to Live Well Today on their website    Parkinson and Movement Disorders (PMD) Alliance:  www.[email or web address]  [employer] Online:  Online Education Events  Sign up for emails, which are sent weekly to give you updates on programming and online offerings    Parkinson's Association of the [place]:  www.[email or web address]  Information on online support groups, education events, and online exercises including Yoga, Parkinson's exercises and more-LOTS of information on links to PD resources and online events  Virtual Support Group through Parkinson's Association of the  [place]; next one is scheduled for Wednesday, Feb 7th  MOVEMENT AND EXERCISE OPPORTUNITIES  PWR! Moves Classes at [place].  Wednesdays 10 and 11 am.   Contact Amy Marriott, PT [email or web address] if interested.  PWR! Moves Class offerings at [employer]. *TUESDAYS* and Wednesdays 1-2 pm.    Contact Vonna Kotyk at  [email or web address]    Parkinson's Wellness Recovery (PWR! Moves)  www.[email or web address]  Info on the PWR! Virtual Experience:  You will have access to our expertise?through self-assessment, guided plans that start with the PD-specific fundamentals, educational content, tips, Q&A with an expert, and a growing Art therapist of PD-specific pre-recorded and live exercise classes of varying types and intensity - both physical and cognitive! If that is not enough, we offer 1:1 wellness consultations (in-person or virtual) to personalize your PWR! Research scientist (medical).   [place] Fridays:   As part of the PD Health @ Home program, this free video series focuses each week on one aspect of fitness designed to support people living with Parkinson's.? These weekly videos highlight the [place] fitness guidelines for people with Parkinson's disease.  ModemGamers.[email or web address]  Dance for PD website is offering free, live-stream classes throughout the week, as well as links to [employer] of classes:  [email or web address]  Virtual dance and Pilates for Parkinson's classes: Click on the Community Tab> Parkinson's Movement Initiative Tab.  To register for classes and for more information, visit [email or web address] and click the "community" tab.   [employer] Parkinson's Cycling Classes   [employer]:  Thursdays @ Noon-Live classes at [employer] ([employer] at [email or web address]?or [phone])  [facility] [employer] [employer]: Virtual Classes Mondays and Thursdays /  Live  classes Tuesday, Wednesday and Thursday (contact [place] at [email or web address] ?or [phone])  [place]  Varied levels of classes are offered Tuesdays and Thursdays at [employer].   Stretching with Verdis Frederickson weekly class is also offered for people with Parkinson's  To observe a class or for more information, call [phone] or email Hezzie Bump at [email or web address]   ADDITIONAL SUPPORT AND RESOURCES  Well[employer]Spring [employer]:Online Caregiver Education Opportunities:  www.well-springsolutions.org/caregiver-education/caregiver-support-group.  You may also contact Vickki Muff at [email or web address] or [phone].     Family Caregiver [phone].  Thursday, March 7th, 10:15-1:45 at [facility]  Register with [facility] (see above) Well-Spring Navigator:  Just1Navigator program, a?free service to help individuals and families through the journey of determining care for older adults.  The "Navigator" is a Vickki Muff, Education officer, museum, who will speak with a prospective client and/or loved ones to provide an assessment of the situation and a set of recommendations for a personalized care plan -- all free of charge, and whether?Well[employer]Spring [employer] offers the needed service or not. If the need is not a service we provide, we are well-connected with reputable programs in town that we can refer you to.  www.well-springsolutions.org or to speak with the Navigator, call [phone].

## 2022-10-07 ENCOUNTER — Ambulatory Visit: Payer: Medicare Other | Admitting: Physical Therapy

## 2022-10-19 ENCOUNTER — Telehealth: Payer: Self-pay | Admitting: Internal Medicine

## 2022-10-19 NOTE — Telephone Encounter (Signed)
Form is in folder for review. She does not have an appt until June. Let me know if I need to do anything

## 2022-10-19 NOTE — Telephone Encounter (Signed)
Patient dropped off a form for [employer] that needs to be completed by Dr Nicki Reaper. Form is up front in Dr [employer] color folder.

## 2022-10-20 NOTE — Telephone Encounter (Signed)
LM for patient. Form placed up front for pick up.

## 2022-10-20 NOTE — Telephone Encounter (Signed)
Form completed and signed.  Placed in box.   

## 2022-12-24 ENCOUNTER — Other Ambulatory Visit: Payer: Self-pay

## 2022-12-24 MED ORDER — CARBIDOPA-LEVODOPA 25-100 MG PO TABS: 1.0000 | ORAL_TABLET | Freq: Three times a day (TID) | ORAL | 0 refills | Status: DC

## 2023-01-03 ENCOUNTER — Telehealth: Payer: Self-pay | Admitting: Internal Medicine

## 2023-01-03 NOTE — Telephone Encounter (Signed)
keesha from [employer] called stating she need the pt recent BP check to verify that she had it checked this year

## 2023-01-05 ENCOUNTER — Encounter: Payer: Self-pay | Admitting: Internal Medicine

## 2023-01-05 LAB — HM MAMMOGRAPHY

## 2023-01-18 ENCOUNTER — Other Ambulatory Visit: Payer: Medicare Other

## 2023-01-20 ENCOUNTER — Ambulatory Visit: Payer: Medicare Other | Admitting: Internal Medicine

## 2023-02-15 ENCOUNTER — Ambulatory Visit: Payer: Medicare Other

## 2023-02-15 VITALS — Wt 166.0 lb

## 2023-02-15 DIAGNOSIS — Z0000 Encounter for general adult medical examination without abnormal findings: Secondary | ICD-10-CM

## 2023-02-15 NOTE — Patient Instructions (Signed)
Helen Marshall , Thank you for taking time to come for your Medicare Wellness Visit. I appreciate your ongoing commitment to your health goals. Please review the following plan we discussed and let me know if I can assist you in the future.   These are the goals we discussed:  Goals       DIET - EAT MORE FRUITS AND VEGETABLES      Maintain Healthy Lifestyle (pt-stated)        This is a list of the screening recommended for you and due dates:  Health Maintenance  Topic Date Due   COVID-19 Vaccine (6 - 2023-24 season) 04/02/2022   Flu Shot  03/03/2023   Mammogram  01/05/2024   Medicare Annual Wellness Visit  02/15/2024   DTaP/Tdap/Td vaccine (2 - Td or Tdap) 04/26/2024   Pneumonia Vaccine  Completed   DEXA scan (bone density measurement)  Completed   Hepatitis C Screening  Completed   Zoster (Shingles) Vaccine  Completed   HPV Vaccine  Aged Out   Colon Cancer Screening  Discontinued    Advanced directives: no  Conditions/risks identified: none  Next appointment: Follow up in one year for your annual wellness visit 02/16/24 @ 9:45 am by phone   Preventive Care 65 Years and Older, Female Preventive care refers to lifestyle choices and visits with your health care provider that can promote health and wellness. What does preventive care include? A yearly physical exam. This is also called an annual well check. Dental exams once or twice a year. Routine eye exams. Ask your health care provider how often you should have your eyes checked. Personal lifestyle choices, including: Daily care of your teeth and gums. Regular physical activity. Eating a healthy diet. Avoiding tobacco and drug use. Limiting alcohol use. Practicing safe sex. Taking low-dose aspirin every day. Taking vitamin and mineral supplements as recommended by your health care provider. What happens during an annual well check? The services and screenings done by your health care provider during your annual well  check will depend on your age, overall health, lifestyle risk factors, and family history of disease. Counseling  Your health care provider may ask you questions about your: Alcohol use. Tobacco use. Drug use. Emotional well-being. Home and relationship well-being. Sexual activity. Eating habits. History of falls. Memory and ability to understand (cognition). Work and work Astronomer. Reproductive health. Screening  You may have the following tests or measurements: Height, weight, and BMI. Blood pressure. Lipid and cholesterol levels. These may be checked every 5 years, or more frequently if you are over 53 years old. Skin check. Lung cancer screening. You may have this screening every year starting at age 61 if you have a 30-pack-year history of smoking and currently smoke or have quit within the past 15 years. Fecal occult blood test (FOBT) of the stool. You may have this test every year starting at age 39. Flexible sigmoidoscopy or colonoscopy. You may have a sigmoidoscopy every 5 years or a colonoscopy every 10 years starting at age 35. Hepatitis C blood test. Hepatitis B blood test. Sexually transmitted disease (STD) testing. Diabetes screening. This is done by checking your blood sugar (glucose) after you have not eaten for a while (fasting). You may have this done every 1-3 years. Bone density scan. This is done to screen for osteoporosis. You may have this done starting at age 70. Mammogram. This may be done every 1-2 years. Talk to your health care provider about how often you should have  regular mammograms. Talk with your health care provider about your test results, treatment options, and if necessary, the need for more tests. Vaccines  Your health care provider may recommend certain vaccines, such as: Influenza vaccine. This is recommended every year. Tetanus, diphtheria, and acellular pertussis (Tdap, Td) vaccine. You may need a Td booster every 10 years. Zoster  vaccine. You may need this after age 106. Pneumococcal 13-valent conjugate (PCV13) vaccine. One dose is recommended after age 13. Pneumococcal polysaccharide (PPSV23) vaccine. One dose is recommended after age 69. Talk to your health care provider about which screenings and vaccines you need and how often you need them. This information is not intended to replace advice given to you by your health care provider. Make sure you discuss any questions you have with your health care provider. Document Released: 08/15/2015 Document Revised: 04/07/2016 Document Reviewed: 05/20/2015 Elsevier Interactive Patient Education  2017 [employer].  Fall Prevention in the Home Falls can cause injuries. They can happen to people of all ages. There are many things you can do to make your home safe and to help prevent falls. What can I do on the outside of my home? Regularly fix the edges of walkways and driveways and fix any cracks. Remove anything that might make you trip as you walk through a door, such as a raised step or threshold. Trim any bushes or trees on the path to your home. Use bright outdoor lighting. Clear any walking paths of anything that might make someone trip, such as rocks or tools. Regularly check to see if handrails are loose or broken. Make sure that both sides of any steps have handrails. Any raised decks and porches should have guardrails on the edges. Have any leaves, snow, or ice cleared regularly. Use sand or salt on walking paths during winter. Clean up any spills in your garage right away. This includes oil or grease spills. What can I do in the bathroom? Use night lights. Install grab bars by the toilet and in the tub and shower. Do not use towel bars as grab bars. Use non-skid mats or decals in the tub or shower. If you need to sit down in the shower, use a plastic, non-slip stool. Keep the floor dry. Clean up any water that spills on the floor as soon as it happens. Remove  soap buildup in the tub or shower regularly. Attach bath mats securely with double-sided non-slip rug tape. Do not have throw rugs and other things on the floor that can make you trip. What can I do in the bedroom? Use night lights. Make sure that you have a light by your bed that is easy to reach. Do not use any sheets or blankets that are too big for your bed. They should not hang down onto the floor. Have a firm chair that has side arms. You can use this for support while you get dressed. Do not have throw rugs and other things on the floor that can make you trip. What can I do in the kitchen? Clean up any spills right away. Avoid walking on wet floors. Keep items that you use a lot in easy-to-reach places. If you need to reach something above you, use a strong step stool that has a grab bar. Keep electrical cords out of the way. Do not use floor polish or wax that makes floors slippery. If you must use wax, use non-skid floor wax. Do not have throw rugs and other things on the floor that  can make you trip. What can I do with my stairs? Do not leave any items on the stairs. Make sure that there are handrails on both sides of the stairs and use them. Fix handrails that are broken or loose. Make sure that handrails are as long as the stairways. Check any carpeting to make sure that it is firmly attached to the stairs. Fix any carpet that is loose or worn. Avoid having throw rugs at the top or bottom of the stairs. If you do have throw rugs, attach them to the floor with carpet tape. Make sure that you have a light switch at the top of the stairs and the bottom of the stairs. If you do not have them, ask someone to add them for you. What else can I do to help prevent falls? Wear shoes that: Do not have high heels. Have rubber bottoms. Are comfortable and fit you well. Are closed at the toe. Do not wear sandals. If you use a stepladder: Make sure that it is fully opened. Do not climb a  closed stepladder. Make sure that both sides of the stepladder are locked into place. Ask someone to hold it for you, if possible. Clearly mark and make sure that you can see: Any grab bars or handrails. First and last steps. Where the edge of each step is. Use tools that help you move around (mobility aids) if they are needed. These include: Canes. Walkers. Scooters. Crutches. Turn on the lights when you go into a dark area. Replace any light bulbs as soon as they burn out. Set up your furniture so you have a clear path. Avoid moving your furniture around. If any of your floors are uneven, fix them. If there are any pets around you, be aware of where they are. Review your medicines with your doctor. Some medicines can make you feel dizzy. This can increase your chance of falling. Ask your doctor what other things that you can do to help prevent falls. This information is not intended to replace advice given to you by your health care provider. Make sure you discuss any questions you have with your health care provider. Document Released: 05/15/2009 Document Revised: 12/25/2015 Document Reviewed: 08/23/2014 Elsevier Interactive Patient Education  2017 [employer].

## 2023-02-15 NOTE — Progress Notes (Signed)
Subjective:   Helen Marshall is a 77 y.o. female who presents for Medicare Annual (Subsequent) preventive examination.  Per patient no change in vitals since last visit, unable to obtain new vitals due to telehealth visit   Visit Complete: Virtual  I connected with  [name] on 02/15/23 by a audio enabled telemedicine application and verified that I am speaking with the correct person using two identifiers.  Patient Location: Home  Provider Location: Office/Clinic  I discussed the limitations of evaluation and management by telemedicine. The patient expressed understanding and agreed to proceed.  Review of Systems     Cardiac Risk Factors include: advanced age (>39men, >56 women);hypertension     Objective:    Today's Vitals   02/15/23 1308 02/15/23 1327  Weight:  166 lb (75.3 kg)  PainSc: 3     Body mass index is 30.36 kg/m.     02/15/2023    1:15 PM 09/30/2022    9:35 AM 06/19/2022    8:00 AM 06/18/2022    9:52 AM 03/31/2022   11:01 AM 03/25/2022    2:32 PM 01/04/2022   10:28 AM  Advanced Directives  Does Patient Have a Medical Advance Directive? No Yes Yes Yes Yes Yes Yes  Type of Advance Directive  Living will Healthcare Power of [place];Living will Living will;Healthcare Power of Attorney Living will;Healthcare Power of [employer] Power of [employer];Living will  Does patient want to make changes to medical advance directive?   No - Patient declined  No - Patient declined  No - Patient declined  Copy of Healthcare Power of Attorney in Chart?   Yes - validated most recent copy scanned in chart (See row information) No - copy requested No - copy requested  No - copy requested  Would patient like information on creating a medical advance directive? No - Patient declined          Current Medications (verified) Outpatient Encounter Medications as of 02/15/2023  Medication Sig   albuterol (VENTOLIN HFA) 108 (90 Base) MCG/ACT inhaler Inhale 2 puffs into the  lungs every 6 (six) hours as needed for wheezing or shortness of breath.   amLODipine (NORVASC) 5 MG tablet Take 1 tablet (5 mg total) by mouth daily.   aspirin EC 325 MG tablet Take 325 mg by mouth daily.   benazepril (LOTENSIN) 40 MG tablet TAKE 1 TABLET BY MOUTH DAILY   Calcium Carb-Cholecalciferol (CALCIUM 600 + D PO) Take 1 tablet by mouth daily.   carbidopa-levodopa (SINEMET IR) 25-100 MG tablet Take 1 tablet by mouth 3 (three) times daily. 7am/11am/4pm   ezetimibe (ZETIA) 10 MG tablet Take 1 tablet (10 mg total) by mouth daily.   fish oil-omega-3 fatty acids 1000 MG capsule Take 2 g by mouth 2 (two) times daily.   fluticasone (FLONASE) 50 MCG/ACT nasal spray Place 2 sprays into both nostrils as needed.   ibuprofen (ADVIL) 200 MG tablet Take 400 mg by mouth every 6 (six) hours as needed for moderate pain.   lidocaine (LIDODERM) 5 % Place 1 patch onto the skin daily as needed (pain). Remove & Discard patch within 12 hours or as directed by MD   loratadine (CLARITIN) 10 MG tablet Take 10 mg by mouth daily as needed for allergies.   Multiple Vitamin (MULTIVITAMIN) tablet Take 1 tablet by mouth daily.   niacin (VITAMIN B3) 500 MG tablet Take 1,000 mg by mouth 2 (two) times daily.   Povidone, PF, (IVIZIA DRY EYES) 0.5 %  SOLN Place 1 drop into both eyes 2 (two) times daily.   Probiotic Product (VSL#3 PO) Take 1 capsule by mouth daily.   Simethicone (GAS-X PO) Take 2 tablets by mouth daily as needed (gas).   tiZANidine (ZANAFLEX) 4 MG tablet Take 1 tablet (4 mg total) by mouth every 6 (six) hours as needed for muscle spasms.   traMADol (ULTRAM) 50 MG tablet Take 1 tablet (50 mg total) by mouth every 4 (four) hours as needed.   triamcinolone cream (KENALOG) 0.1 % Apply 1 Application topically as needed (psoriasis).   MELATONIN PO Take 2 tablets by mouth at bedtime as needed (sleep). (Patient not taking: Reported on 02/15/2023)   No facility-administered encounter medications on file as of  02/15/2023.    Allergies (verified) Crestor [rosuvastatin], Demerol [meperidine], Latex, Lipitor [atorvastatin], Lovastatin, Oxycodone, Pravastatin, Tramadol, Vicodin [hydrocodone-acetaminophen], Zocor [simvastatin], and Gabapentin   History: Past Medical History:  Diagnosis Date   Abnormal LFTs    Abnormal mammogram 12/19/2012   left   Allergy    Anemia    Arthritis    Cancer (HCC) 2014   left Breast   Carotid bruit    left   Cataract    Colon polyps    Diverticulosis 2012   Environmental allergies    Family history of adverse reaction to anesthesia    her aunt was hard to wake up   GERD (gastroesophageal reflux disease)    Heart murmur    History of diverticulitis 05/2021   Hypercholesterolemia    Hyperglycemia    Hypertension    IBS (irritable bowel syndrome)    Lipoma of colon    Lumbar disc disease    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   Pre-diabetes    Tubular adenoma of colon    Umbilical hernia 05/2021   Past Surgical History:  Procedure Laterality Date   ABDOMINAL HYSTERECTOMY  1989   fibroids   ANKLE SURGERY Left 1999   ANTERIOR AND POSTERIOR REPAIR WITH SACROSPINOUS FIXATION N/A 07/20/2021   Procedure: ANTERIOR AND REPAIR WITH SACROSPINOUS FIXATION;  Surgeon: Marguerita Beards, MD;  Location: [facility];  Service: Gynecology;  Laterality: N/A;   BREAST SURGERY Left 1970   biopsy   CATARACT EXTRACTION, BILATERAL  June & July 2017   CHOLECYSTECTOMY  2001   COLONOSCOPY  06/02/2011   Lutricia Feil, MD; diverticulosis, submucosal lipoma of the sigmoid colon.   CYSTOSCOPY N/A 07/20/2021   Procedure: CYSTOSCOPY;  Surgeon: Marguerita Beards, MD;  Location: [facility];  Service: Gynecology;  Laterality: N/A;    eyelid lift Bilateral    HERNIA REPAIR  2001, 2004   HERNIA REPAIR  01/22/2013   Repeat repair of umbilical defect, 2.5 cm. Primary repair.   INSERTION OF MESH N/A 03/31/2022   Procedure: INSERTION OF MESH;  Surgeon: [name], MD;  Location: [facility];  Service: General;  Laterality: N/A;   LAPAROSCOPIC HYSTERECTOMY     LUMBAR LAMINECTOMY/DECOMPRESSION MICRODISCECTOMY Bilateral 06/18/2022   Procedure: Bilateral L3-4 Lumbar decompression;  Surgeon: Coletta Memos, MD;  Location: [facility] OR;  Service: Neurosurgery;  Laterality: Bilateral;   NASAL SINUS SURGERY  1997   rotator cuff surgery  1998   SQUAMOUS CELL CARCINOMA EXCISION Right 05/2013   shoulder   TUBAL LIGATION  1976  UPPER GI ENDOSCOPY  08/22/2014   Dr Shela Commons. Rhea Belton   VENTRAL HERNIA REPAIR N/A 03/31/2022   Procedure: HERNIA REPAIR VENTRAL ADULT;  Surgeon: [name], MD;  Location: [facility];  Service: General;  Laterality: N/A;  Sonda Rumble, RNFA to assist TAPP block per anesthesia   Family History  Problem Relation Age of Onset   Heart disease Mother        myocardial infarction   Diabetes Mother    Hearing loss Mother    CVA Father    Alcohol abuse Father    Arthritis/Rheumatoid Sister        x2   Breast cancer Sister 22   Arthritis Sister    Hearing loss Sister    Arthritis Sister    Hearing loss Sister    Asthma Sister    Arthritis Sister    Hearing loss Sister    Hypothyroidism Sister    Arthritis Sister    Hearing loss Sister    Colon polyps Sister    Arthritis Sister    Hearing loss Sister    Hypertension Brother        x2   Arthritis Brother    Hearing loss Brother    Alcohol abuse Brother    Arthritis Brother    Hearing loss Brother    Colon polyps Son    Alcohol abuse Son    Colon cancer Maternal Uncle    Cancer Other        breast - paternal first cousin   Stroke Daughter    Social History   Socioeconomic History   Marital status: Married    Spouse name: ,[name]   Number of  children: 2   Years of education: Not on file   Highest education level: Not on file  Occupational History   Occupation: Retired    Comment: Designer, 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  Tobacco Use   Smoking status: Never   Smokeless tobacco: Never  Vaping Use   Vaping status: Never Used  Substance and Sexual Activity   Alcohol use: No    Alcohol/week: 0.0 standard drinks of alcohol   Drug use: No   Sexual activity: Not Currently  Other Topics Concern   Not on file  Social History Narrative   Right handed   Retired    International aid/development worker of Health   Financial Resource Strain: Low Risk  (02/15/2023)   Overall Financial Resource Strain (CARDIA)    Difficulty of Paying Living Expenses: Not hard at all  Food Insecurity: No Food Insecurity (02/15/2023)   Hunger Vital Sign    Worried About Running Out of Food in the Last Year: Never true    Ran Out of Food in the Last Year: Never true  Transportation Needs: No Transportation Needs (02/15/2023)   PRAPARE - Administrator, Civil Service (Medical): No    Lack of Transportation (Non-Medical): No  Physical Activity: Sufficiently Active (02/15/2023)   Exercise Vital Sign    Days of Exercise per Week: 3 days    Minutes of Exercise per Session: 60 min  Stress: No Stress Concern Present (02/15/2023)   [employer] of Occupational Health - Occupational Stress Questionnaire    Feeling of Stress : Not at all  Social Connections: Socially Integrated (02/15/2023)   Social Connection and Isolation Panel [NHANES]    Frequency of Communication with Friends and Family: More than three times a week    Frequency of Social Gatherings with Friends and Family: Once a week  Attends Religious Services: More than 4 times per year    Active Member of Clubs or Organizations: Yes    Attends Banker Meetings: More than 4 times per year    Marital Status: Married    Tobacco Counseling Counseling given: Not Answered   Clinical Intake:  Pre-visit  preparation completed: Yes  Pain : 0-10 Pain Score: 3  Pain Type: Chronic pain Pain Location: Back Pain Relieving Factors: accupuncture  Pain Relieving Factors: accupuncture  Nutritional Risks: None Diabetes: No  How often do you need to have someone help you when you read instructions, pamphlets, or other written materials from your doctor or pharmacy?: 1 - Never  Interpreter Needed?: No  Information entered by :: Kennedy Bucker, LPN   Activities of Daily Living    02/15/2023    1:15 PM 06/19/2022    8:00 AM  In your present state of health, do you have any difficulty performing the following activities:  Hearing? 0   Vision? 0   Difficulty concentrating or making decisions? 0   Walking or climbing stairs? 0   Comment knees hurt   Dressing or bathing? 0   Doing errands, shopping? 0 1  Preparing Food and eating ? N   Using the Toilet? N   In the past six months, have you accidently leaked urine? N   Do you have problems with loss of bowel control? N   Managing your Medications? N   Managing your Finances? N   Housekeeping or managing your Housekeeping? N     Patient Care Team: Dale Coleman, MD as PCP - General (Internal Medicine) Lemar Livings Merrily Pew, MD (General Surgery) Dasher, Cliffton Asters, MD (Dermatology) Troxler, Molli Hazard, DPM (Inactive) (Podiatry)  Indicate any recent Medical Services you may have received from other than [facility] providers in the past year (date may be approximate).     Assessment:   This is a routine wellness examination for [employer].  Hearing/Vision screen Hearing Screening - Comments:: No aids Vision Screening - Comments:: Wears glasses- Dr. Clydene Pugh  Dietary issues and exercise activities discussed:     Goals Addressed             This Visit's Progress    DIET - EAT MORE FRUITS AND VEGETABLES         Depression Screen    02/15/2023    1:13 PM 09/21/2022    1:27 PM 01/12/2022   10:16 AM 01/04/2022   10:26 AM 12/30/2020   11:29 AM  12/27/2019   11:17 AM 08/29/2019   10:54 AM  PHQ 2/9 Scores  PHQ - 2 Score 0 0 1 0 0 0 0  PHQ- 9 Score 0          Fall Risk    02/15/2023    1:15 PM 09/30/2022    9:35 AM 09/21/2022    1:27 PM 01/12/2022   10:16 AM 01/04/2022   10:53 AM  Fall Risk   Falls in the past year? 0 0 0 0 0  Number falls in past yr: 0 0 0  0  Injury with Fall? 0 0 0    Risk for fall due to : No Fall Risks  No Fall Risks No Fall Risks   Follow up Falls prevention discussed;Falls evaluation completed Falls evaluation completed Falls evaluation completed Falls evaluation completed Falls evaluation completed    MEDICARE RISK AT HOME:  Medicare Risk at Home - 02/15/23 1316     Any stairs in or around the  home? Yes    If so, are there any without handrails? No    Home free of loose throw rugs in walkways, pet beds, electrical cords, etc? Yes    Adequate lighting in your home to reduce risk of falls? Yes    Life alert? No    Use of a cane, walker or w/c? --   cane when going somewhere uneven ground   Grab bars in the bathroom? Yes    Shower chair or bench in shower? No    Elevated toilet seat or a handicapped toilet? No             TIMED UP AND GO:  Was the test performed?  No    Cognitive Function:    08/09/2017   10:27 AM 07/12/2016    8:43 AM 07/17/2015    1:37 PM  MMSE - Mini Mental State Exam  Orientation to time 5 5 5   Orientation to Place 5 5 5   Registration 3 3 3   Attention/ Calculation 5 5 5   Recall 3 3 3   Language- name 2 objects 2 2 2   Language- repeat 1 1 1   Language- follow 3 step command 3 3 3   Language- read & follow direction 1 1 1   Write a sentence 1 1 1   Copy design 1 1 1   Total score 30 30 30         02/15/2023    1:17 PM 12/30/2020   11:43 AM 12/27/2019   11:17 AM 08/10/2018   11:27 AM  6CIT Screen  What Year? 0 points 0 points 0 points 0 points  What month? 0 points 0 points 0 points 0 points  What time? 3 points 0 points 0 points 0 points  Count back from 20 0  points 0 points 0 points 0 points  Months in reverse 0 points 0 points 0 points 0 points  Repeat phrase 0 points 0 points 0 points 0 points  Total Score 3 points 0 points 0 points 0 points    Immunizations Immunization History  Administered Date(s) Administered   Fluad Quad(high Dose 65+) 04/19/2019, 07/15/2020, 05/12/2021, 05/21/2022   Influenza Split 05/16/2012, 04/02/2017   Influenza, High Dose Seasonal PF 05/15/2016, 03/30/2017, 05/25/2018   Influenza,inj,Quad PF,6+ Mos 05/09/2013, 05/23/2014, 07/03/2015   PFIZER Comirnaty(Gray Top)Covid-19 Tri-Sucrose Vaccine 03/31/2020   PFIZER(Purple Top)SARS-COV-2 Vaccination 08/09/2019, 09/01/2019, 03/31/2020, 11/21/2020   Pneumococcal Conjugate-13 09/13/2013   Pneumococcal Polysaccharide-23 11/08/2014   Pneumococcal-Unspecified 08/02/2009   Tdap 04/26/2014   Zoster Recombinant(Shingrix) 01/03/2018, 03/21/2018   Zoster, Live 08/02/2009    TDAP status: Up to date  Flu Vaccine status: Up to date  Pneumococcal vaccine status: Up to date  Covid-19 vaccine status: Completed vaccines  Qualifies for Shingles Vaccine? Yes   Zostavax completed Yes   Shingrix Completed?: Yes  Screening Tests Health Maintenance  Topic Date Due   COVID-19 Vaccine (6 - 2023-24 season) 04/02/2022   INFLUENZA VACCINE  03/03/2023   MAMMOGRAM  01/05/2024   Medicare Annual Wellness (AWV)  02/15/2024   DTaP/Tdap/Td (2 - Td or Tdap) 04/26/2024   Pneumonia Vaccine 1+ Years old  Completed   DEXA SCAN  Completed   Hepatitis C Screening  Completed   Zoster Vaccines- Shingrix  Completed   HPV VACCINES  Aged Out   Colonoscopy  Discontinued    Health Maintenance  Health Maintenance Due  Topic Date Due   COVID-19 Vaccine (6 - 2023-24 season) 04/02/2022    Colorectal cancer screening: Type of screening: Colonoscopy. Completed  06/29/21. Repeat every 10 years- AGED OUT  Mammogram status: Completed 01/05/23. Repeat every year  Bone Density status: Completed  05/02/19. Results reflect: Bone density results: OSTEOPENIA. Repeat every 5 years.  Lung Cancer Screening: (Low Dose CT Chest recommended if Age 13-80 years, 20 pack-year currently smoking OR have quit w/in 15years.) does not qualify.    Additional Screening:  Hepatitis C Screening: does qualify; Completed 07/17/15  Vision Screening: Recommended annual ophthalmology exams for early detection of glaucoma and other disorders of the eye. Is the patient up to date with their annual eye exam?  Yes  Who is the provider or what is the name of the office in which the patient attends annual eye exams?Dr. Clydene Pugh If pt is not established with a provider, would they like to be referred to a provider to establish care? No .   Dental Screening: Recommended annual dental exams for proper oral hygiene   Community Resource Referral / Chronic Care Management: CRR required this visit?  No   CCM required this visit?  No     Plan:     I have personally reviewed and noted the following in the patient's chart:   Medical and social history Use of alcohol, tobacco or illicit drugs  Current medications and supplements including opioid prescriptions. Patient is not currently taking opioid prescriptions. Functional ability and status Nutritional status Physical activity Advanced directives List of other physicians Hospitalizations, surgeries, and ER visits in previous 12 months Vitals Screenings to include cognitive, depression, and falls Referrals and appointments  In addition, I have reviewed and discussed with patient certain preventive protocols, quality metrics, and best practice recommendations. A written personalized care plan for preventive services as well as general preventive health recommendations were provided to patient.     Hal Hope, LPN   09/15/863   After Visit Summary: (MyChart) Due to this being a telephonic visit, the after visit summary with patients personalized plan  was offered to patient via MyChart   Nurse Notes: none

## 2023-02-25 ENCOUNTER — Other Ambulatory Visit (INDEPENDENT_AMBULATORY_CARE_PROVIDER_SITE_OTHER): Payer: Medicare Other

## 2023-02-25 DIAGNOSIS — R739 Hyperglycemia, unspecified: Secondary | ICD-10-CM | POA: Diagnosis not present

## 2023-02-25 DIAGNOSIS — D72829 Elevated white blood cell count, unspecified: Secondary | ICD-10-CM | POA: Diagnosis not present

## 2023-02-25 DIAGNOSIS — E7800 Pure hypercholesterolemia, unspecified: Secondary | ICD-10-CM | POA: Diagnosis not present

## 2023-02-25 LAB — HEPATIC FUNCTION PANEL
ALT: 4 U/L (ref 0–35)
AST: 26 U/L (ref 0–37)
Albumin: 4 g/dL (ref 3.5–5.2)
Alkaline Phosphatase: 73 U/L (ref 39–117)
Bilirubin, Direct: 0.1 mg/dL (ref 0.0–0.3)
Total Bilirubin: 0.5 mg/dL (ref 0.2–1.2)
Total Protein: 6.6 g/dL (ref 6.0–8.3)

## 2023-02-25 LAB — BASIC METABOLIC PANEL
BUN: 13 mg/dL (ref 6–23)
CO2: 28 mEq/L (ref 19–32)
Calcium: 9 mg/dL (ref 8.4–10.5)
Chloride: 106 mEq/L (ref 96–112)
Creatinine, Ser: 0.72 mg/dL (ref 0.40–1.20)
GFR: 80.56 mL/min (ref >60.00)
Glucose, Bld: 112 mg/dL — ABNORMAL HIGH (ref 70–99)
Potassium: 3.5 mEq/L (ref 3.5–5.1)
Sodium: 142 mEq/L (ref 135–145)

## 2023-02-25 LAB — CBC WITH DIFFERENTIAL/PLATELET
Basophils Absolute: 0.1 10*3/uL (ref 0.0–0.1)
Basophils Relative: 1.1 % (ref 0.0–3.0)
Eosinophils Absolute: 0.3 10*3/uL (ref 0.0–0.7)
Eosinophils Relative: 5.1 % — ABNORMAL HIGH (ref 0.0–5.0)
HCT: 40.8 % (ref 36.0–46.0)
Hemoglobin: 13.3 g/dL (ref 12.0–15.0)
Lymphocytes Relative: 35.7 % (ref 12.0–46.0)
Lymphs Abs: 1.9 10*3/uL (ref 0.7–4.0)
MCHC: 32.5 g/dL (ref 30.0–36.0)
MCV: 95.8 fl (ref 78.0–100.0)
Monocytes Absolute: 0.6 10*3/uL (ref 0.1–1.0)
Monocytes Relative: 11.3 % (ref 3.0–12.0)
Neutro Abs: 2.4 10*3/uL (ref 1.4–7.7)
Neutrophils Relative %: 46.8 % (ref 43.0–77.0)
Platelets: 216 10*3/uL (ref 150.0–400.0)
RBC: 4.26 Mil/uL (ref 3.87–5.11)
RDW: 13.6 % (ref 11.5–15.5)
WBC: 5.2 10*3/uL (ref 4.0–10.5)

## 2023-02-25 LAB — LIPID PANEL
Cholesterol: 206 mg/dL — ABNORMAL HIGH (ref 0–200)
HDL: 75.3 mg/dL (ref >39.00)
LDL Cholesterol: 112 mg/dL — ABNORMAL HIGH (ref 0–99)
NonHDL: 130.95
Total CHOL/HDL Ratio: 3
Triglycerides: 93 mg/dL (ref 0.0–149.0)
VLDL: 18.6 mg/dL (ref 0.0–40.0)

## 2023-02-25 LAB — HEMOGLOBIN A1C: Hgb A1c MFr Bld: 5.7 % (ref 4.6–6.5)

## 2023-03-01 ENCOUNTER — Ambulatory Visit: Payer: Medicare Other | Admitting: Internal Medicine

## 2023-03-01 ENCOUNTER — Encounter: Payer: Self-pay | Admitting: Internal Medicine

## 2023-03-01 VITALS — BP 128/76 | HR 78 | Temp 97.9°F | Resp 16 | Ht 62.5 in | Wt 169.0 lb

## 2023-03-01 DIAGNOSIS — E7800 Pure hypercholesterolemia, unspecified: Secondary | ICD-10-CM

## 2023-03-01 DIAGNOSIS — I10 Essential (primary) hypertension: Secondary | ICD-10-CM

## 2023-03-01 DIAGNOSIS — Z853 Personal history of malignant neoplasm of breast: Secondary | ICD-10-CM | POA: Diagnosis not present

## 2023-03-01 DIAGNOSIS — D72829 Elevated white blood cell count, unspecified: Secondary | ICD-10-CM

## 2023-03-01 DIAGNOSIS — G20A1 Parkinson's disease without dyskinesia, without mention of fluctuations: Secondary | ICD-10-CM

## 2023-03-01 DIAGNOSIS — R911 Solitary pulmonary nodule: Secondary | ICD-10-CM

## 2023-03-01 DIAGNOSIS — R739 Hyperglycemia, unspecified: Secondary | ICD-10-CM | POA: Diagnosis not present

## 2023-03-01 DIAGNOSIS — R7989 Other specified abnormal findings of blood chemistry: Secondary | ICD-10-CM

## 2023-03-01 NOTE — Assessment & Plan Note (Signed)
Mammogram 01/05/23 - Birads I.

## 2023-03-01 NOTE — Assessment & Plan Note (Signed)
Dr Tat 09/2022 - diagnosed.  Started sinemet. Doing well. Helping.  Tremor better.  Boxing.  Follow.

## 2023-03-01 NOTE — Assessment & Plan Note (Signed)
Incidental finding CT abdomen 04/26/21.  Had f/u chest CT 10/02/21 - stable 20m right lower lobe pulmonary nodule.

## 2023-03-01 NOTE — Progress Notes (Signed)
Subjective:    Patient ID: Helen Marshall, female    DOB: 09-27-1945, 77 y.o.   MRN: [id number]  Patient here for  Chief Complaint  Patient presents with   Medical Management of Chronic Issues    HPI Here for f/u regarding hypercholesterolemia, hypertension and hyperglycemia.  Was seeing Dr Lemar Livings - f/u breast cancer. He retired.  Recommended continued annual screening through PCP. Was evaluated by Dr Tat for tremor.  Diagnosed with tremor predominant idiopathic parkinson's disease. Started on sinemet. Was [facility]  - for PT.  She is participating in boxing - here locally.  This is going well.  Doing well with sinemet.  Gait improved.  Getting around better.  Tremors better. Had mammogram 01/05/23 - Birads I.  Increased stress - discussed.  Daughter recently passed.  Does not feel she needs any further intervention at this time.  No chest pain or sob reported.  Bowels stable.    Past Medical History:  Diagnosis Date   Abnormal LFTs    Abnormal mammogram 12/19/2012   left   Allergy    Anemia    Arthritis    Cancer [facility]) 2014   left Breast   Carotid bruit    left   Cataract    Colon polyps    Diverticulosis 2012   Environmental allergies    Family history of adverse reaction to anesthesia    her aunt was hard to wake up   GERD (gastroesophageal reflux disease)    Heart murmur    History of diverticulitis 05/2021   Hypercholesterolemia    Hyperglycemia    Hypertension    IBS (irritable bowel syndrome)    Lipoma of colon    Lumbar disc disease    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   Pre-diabetes    Tubular adenoma of colon    Umbilical hernia 05/2021   Past Surgical History:  Procedure Laterality Date    ABDOMINAL HYSTERECTOMY  1989   fibroids   ANKLE SURGERY Left 1999   ANTERIOR AND POSTERIOR REPAIR WITH SACROSPINOUS FIXATION N/A 07/20/2021   Procedure: ANTERIOR AND REPAIR WITH SACROSPINOUS FIXATION;  Surgeon: Marguerita Beards, MD;  Location: [facility];  Service: Gynecology;  Laterality: N/A;   BREAST SURGERY Left 1970   biopsy   CATARACT EXTRACTION, BILATERAL  June & July 2017   CHOLECYSTECTOMY  2001   COLONOSCOPY  06/02/2011   Lutricia Feil, MD; diverticulosis, submucosal lipoma of the sigmoid colon.   CYSTOSCOPY N/A 07/20/2021   Procedure: CYSTOSCOPY;  Surgeon: Marguerita Beards, MD;  Location: [facility];  Service: Gynecology;  Laterality: N/A;   eyelid lift Bilateral    HERNIA REPAIR  2001, 2004   HERNIA REPAIR  01/22/2013   Repeat repair of umbilical defect, 2.5 cm. Primary repair.   INSERTION OF MESH N/A 03/31/2022   Procedure: INSERTION OF MESH;  Surgeon: [name], MD;  Location: [facility];  Service: General;  Laterality: N/A;   LAPAROSCOPIC HYSTERECTOMY     LUMBAR LAMINECTOMY/DECOMPRESSION MICRODISCECTOMY Bilateral 06/18/2022   Procedure: Bilateral L3-4 Lumbar decompression;  Surgeon: Coletta Memos, MD;  Location: [facility] OR;  Service: Neurosurgery;  Laterality: Bilateral;   NASAL SINUS SURGERY  1997   rotator cuff surgery  1998  SQUAMOUS CELL CARCINOMA EXCISION Right 05/2013   shoulder   TUBAL LIGATION  1976   UPPER GI ENDOSCOPY  08/22/2014   Dr Shela Commons. Rhea Belton   VENTRAL HERNIA REPAIR N/A 03/31/2022   Procedure: HERNIA REPAIR VENTRAL ADULT;  Surgeon: [name], MD;  Location: [facility];  Service: General;  Laterality: N/A;  Sonda Rumble, RNFA to assist TAPP block per anesthesia   Family History  Problem Relation Age of Onset   Heart disease Mother        myocardial infarction   Diabetes Mother    Hearing loss Mother    CVA Father    Alcohol abuse Father    Arthritis/Rheumatoid Sister        x2   Breast cancer Sister 65    Arthritis Sister    Hearing loss Sister    Arthritis Sister    Hearing loss Sister    Asthma Sister    Arthritis Sister    Hearing loss Sister    Hypothyroidism Sister    Arthritis Sister    Hearing loss Sister    Colon polyps Sister    Arthritis Sister    Hearing loss Sister    Hypertension Brother        x2   Arthritis Brother    Hearing loss Brother    Alcohol abuse Brother    Arthritis Brother    Hearing loss Brother    Colon polyps Son    Alcohol abuse Son    Colon cancer Maternal Uncle    Cancer Other        breast - paternal first cousin   Stroke Daughter    Social History   Socioeconomic History   Marital status: Married    Spouse name: ,[name]   Number of children: 2   Years of education: Not on file   Highest education level: Not on file  Occupational History   Occupation: Retired    Comment: Designer, 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  Tobacco Use   Smoking status: Never   Smokeless tobacco: Never  Vaping Use   Vaping status: Never Used  Substance and Sexual Activity   Alcohol use: No    Alcohol/week: 0.0 standard drinks of alcohol   Drug use: No   Sexual activity: Not Currently  Other Topics Concern   Not on file  Social History Narrative   Right handed   Retired    International aid/development worker of Health   Financial Resource Strain: Low Risk  (02/15/2023)   Overall Financial Resource Strain (CARDIA)    Difficulty of Paying Living Expenses: Not hard at all  Food Insecurity: No Food Insecurity (02/15/2023)   Hunger Vital Sign    Worried About Running Out of Food in the Last Year: Never true    Ran Out of Food in the Last Year: Never true  Transportation Needs: No Transportation Needs (02/15/2023)   PRAPARE - Administrator, Civil Service (Medical): No    Lack of Transportation (Non-Medical): No  Physical Activity: Sufficiently Active (02/15/2023)   Exercise Vital Sign    Days of Exercise per Week: 3 days    Minutes of Exercise per Session: 60 min  Stress: No  Stress Concern Present (02/15/2023)   [employer] of Occupational Health - Occupational Stress Questionnaire    Feeling of Stress : Not at all  Social Connections: Socially Integrated (02/15/2023)   Social Connection and Isolation Panel [NHANES]    Frequency of Communication with Friends and Family: More than  three times a week    Frequency of Social Gatherings with Friends and Family: Once a week    Attends Religious Services: More than 4 times per year    Active Member of [employer] or Organizations: Yes    Attends Engineer, structural: More than 4 times per year    Marital Status: Married     Review of Systems  Constitutional:  Negative for appetite change and unexpected weight change.  HENT:  Negative for congestion and sinus pressure.   Respiratory:  Negative for cough, chest tightness and shortness of breath.   Cardiovascular:  Negative for chest pain, palpitations and leg swelling.  Gastrointestinal:  Negative for abdominal pain, diarrhea, nausea and vomiting.  Genitourinary:  Negative for difficulty urinating and dysuria.  Musculoskeletal:  Negative for joint swelling and myalgias.  Skin:  Negative for color change and rash.  Neurological:  Negative for dizziness and headaches.       Tremors improved.   Psychiatric/Behavioral:  Negative for agitation.        Increased stress as outlined.        Objective:     BP 128/76   Pulse 78   Temp 97.9 F (36.6 C)   Resp 16   Ht 5' 2.5" (1.588 m)   Wt 169 lb (76.7 kg)   LMP 07/21/1988   SpO2 98%   BMI 30.42 kg/m  Wt Readings from Last 3 Encounters:  03/01/23 169 lb (76.7 kg)  02/15/23 166 lb (75.3 kg)  09/30/22 166 lb (75.3 kg)    Physical Exam Vitals reviewed.  Constitutional:      General: She is not in acute distress.    Appearance: Normal appearance.  HENT:     Head: Normocephalic and atraumatic.     Right Ear: External ear normal.     Left Ear: External ear normal.  Eyes:     General: No scleral  icterus.       Right eye: No discharge.        Left eye: No discharge.     Conjunctiva/sclera: Conjunctivae normal.  Neck:     Thyroid: No thyromegaly.  Cardiovascular:     Rate and Rhythm: Normal rate and regular rhythm.  Pulmonary:     Effort: No respiratory distress.     Breath sounds: Normal breath sounds. No wheezing.  Abdominal:     General: Bowel sounds are normal.     Palpations: Abdomen is soft.     Tenderness: There is no abdominal tenderness.  Musculoskeletal:        General: No swelling or tenderness.     Cervical back: Neck supple. No tenderness.  Lymphadenopathy:     Cervical: No cervical adenopathy.  Skin:    Findings: No erythema or rash.  Neurological:     Mental Status: She is alert.  Psychiatric:        Mood and Affect: Mood normal.        Behavior: Behavior normal.      Outpatient Encounter Medications as of 03/01/2023  Medication Sig   albuterol (VENTOLIN HFA) 108 (90 Base) MCG/ACT inhaler Inhale 2 puffs into the lungs every 6 (six) hours as needed for wheezing or shortness of breath.   amLODipine (NORVASC) 5 MG tablet Take 1 tablet (5 mg total) by mouth daily.   aspirin EC 325 MG tablet Take 325 mg by mouth daily.   benazepril (LOTENSIN) 40 MG tablet TAKE 1 TABLET BY MOUTH DAILY   Calcium Carb-Cholecalciferol (CALCIUM  600 + D PO) Take 1 tablet by mouth daily.   carbidopa-levodopa (SINEMET IR) 25-100 MG tablet Take 1 tablet by mouth 3 (three) times daily. 7am/11am/4pm   ezetimibe (ZETIA) 10 MG tablet Take 1 tablet (10 mg total) by mouth daily.   fish oil-omega-3 fatty acids 1000 MG capsule Take 2 g by mouth 2 (two) times daily.   fluticasone (FLONASE) 50 MCG/ACT nasal spray Place 2 sprays into both nostrils as needed.   ibuprofen (ADVIL) 200 MG tablet Take 400 mg by mouth every 6 (six) hours as needed for moderate pain.   lidocaine (LIDODERM) 5 % Place 1 patch onto the skin daily as needed (pain). Remove & Discard patch within 12 hours or as directed by  MD   loratadine (CLARITIN) 10 MG tablet Take 10 mg by mouth daily as needed for allergies.   Multiple Vitamin (MULTIVITAMIN) tablet Take 1 tablet by mouth daily.   niacin (VITAMIN B3) 500 MG tablet Take 1,000 mg by mouth 2 (two) times daily.   Povidone, PF, (IVIZIA DRY EYES) 0.5 % SOLN Place 1 drop into both eyes 2 (two) times daily.   Probiotic Product (VSL#3 PO) Take 1 capsule by mouth daily.   Simethicone (GAS-X PO) Take 2 tablets by mouth daily as needed (gas).   tiZANidine (ZANAFLEX) 4 MG tablet Take 1 tablet (4 mg total) by mouth every 6 (six) hours as needed for muscle spasms.   traMADol (ULTRAM) 50 MG tablet Take 1 tablet (50 mg total) by mouth every 4 (four) hours as needed.   triamcinolone cream (KENALOG) 0.1 % Apply 1 Application topically as needed (psoriasis).   [DISCONTINUED] MELATONIN PO Take 2 tablets by mouth at bedtime as needed (sleep). (Patient not taking: Reported on 02/15/2023)   No facility-administered encounter medications on file as of 03/01/2023.     Lab Results  Component Value Date   WBC 5.2 02/25/2023   HGB 13.3 02/25/2023   HCT 40.8 02/25/2023   PLT 216.0 02/25/2023   GLUCOSE 112 (H) 02/25/2023   CHOL 206 (H) 02/25/2023   TRIG 93.0 02/25/2023   HDL 75.30 02/25/2023   LDLDIRECT 101.4 05/07/2013   LDLCALC 112 (H) 02/25/2023   ALT 4 02/25/2023   AST 26 02/25/2023   NA 142 02/25/2023   K 3.5 02/25/2023   CL 106 02/25/2023   CREATININE 0.72 02/25/2023   BUN 13 02/25/2023   CO2 28 02/25/2023   TSH 1.95 05/18/2022   HGBA1C 5.7 02/25/2023   MICROALBUR <0.7 04/19/2018    MR BRAIN WO CONTRAST  Result Date: 07/10/2022 CLINICAL DATA:  Tremor on the right side of the body. EXAM: MRI HEAD WITHOUT CONTRAST TECHNIQUE: Multiplanar, multiecho pulse sequences of the brain and surrounding structures were obtained without intravenous contrast. COMPARISON:  None Available. FINDINGS: Brain: No acute infarction, hemorrhage, hydrocephalus, extra-axial collection or mass  lesion. Vascular: Normal flow voids. Skull and upper cervical spine: Normal marrow signal. Sinuses/Orbits: Bilateral lens replacement. Polypoid mucosal thickening bilateral maxillary sinuses. Other: None. IMPRESSION: No definite finding to explain right-sided hand tremors. Electronically Signed   By: Lorenza Cambridge M.D.   On: 07/10/2022 13:58       Assessment & Plan:  Hypercholesterolemia Assessment & Plan: The 10-year ASCVD risk score (Arnett DK, et al., 2019) is: 32.9%   Values used to calculate the score:     Age: 33 years     Sex: Female     Is Non-Hispanic African American: No     Diabetic: No  Tobacco smoker: No     Systolic Blood Pressure: 146 mmHg     Is BP treated: Yes     HDL Cholesterol: 75.3 mg/dL     Total Cholesterol: 206 mg/dL  Intolerant to statin.  On zetia.   Orders: -     Lipid panel; Future -     Hepatic function panel; Future -     Basic metabolic panel; Future -     TSH; Future  Hyperglycemia Assessment & Plan: Low carb diet and exercise.  Follow met b and a1c.   Lab Results  Component Value Date   HGBA1C 5.7 02/25/2023    Orders: -     Hemoglobin A1c; Future  Abnormal liver function test Assessment & Plan: Diet and exercise.  Follow liver panel.    History of breast cancer Assessment & Plan: Mammogram 01/05/23 - Birads I.    Primary hypertension Assessment & Plan: Continue amlodipine, benazepril.  Blood pressure has been under good control on this regimen.  Follow pressures.  Follow metabolic panel.     Leukocytosis, unspecified type Assessment & Plan: Recheck cbc - white blood cell count wnl.    Lung nodule Assessment & Plan:  Incidental finding CT abdomen 04/26/21.  Had f/u chest CT 10/02/21 - stable 4mm right lower lobe pulmonary nodule.     Parkinson's disease, unspecified whether dyskinesia present, unspecified whether manifestations fluctuate Assessment & Plan: Dr Tat 09/2022 - diagnosed.  Started sinemet. Doing well. Helping.   Tremor better.  Boxing.  Follow.       Dale Eolia, MD

## 2023-03-01 NOTE — Assessment & Plan Note (Signed)
Diet and exercise.  Follow liver panel.   

## 2023-03-01 NOTE — Assessment & Plan Note (Signed)
Low carb diet and exercise.  Follow met b and a1c.   Lab Results  Component Value Date   HGBA1C 5.7 02/25/2023

## 2023-03-01 NOTE — Assessment & Plan Note (Signed)
Recheck cbc - white blood cell count wnl.

## 2023-03-01 NOTE — Assessment & Plan Note (Signed)
Continue amlodipine, benazepril.  Blood pressure has been under good control on this regimen.  Follow pressures.  Follow metabolic panel.   

## 2023-03-01 NOTE — Assessment & Plan Note (Addendum)
The 10-year ASCVD risk score (Arnett DK, et al., 2019) is: 32.9%   Values used to calculate the score:     Age: 77 years     Sex: Female     Is Non-Hispanic African American: No     Diabetic: No     Tobacco smoker: No     Systolic Blood Pressure: 146 mmHg     Is BP treated: Yes     HDL Cholesterol: 75.3 mg/dL     Total Cholesterol: 206 mg/dL  Intolerant to statin.  On zetia.

## 2023-03-08 NOTE — Progress Notes (Signed)
Assessment/Plan:   1.  Parkinsons Disease, diagnosed February, 2024  -Continue carbidopa/levodopa 25/100, 1 tablet 3 times per day.  She looks much better on medication.  Refilled today  -proud of her for all of the exercise!  -We discussed that it used to be thought that levodopa would increase risk of melanoma but now it is believed that Parkinsons itself likely increases risk of melanoma. she is to get regular skin checks.  She is doing this  -invited her to Parkinsons Disease symposium  -filled out info for aware in care  2.  Back pain  -Status post lumbar laminectomy with Dr. Franky Macho in November, 2023 Subjective:   Helen Marshall was seen today in follow up for Parkinsons disease, diagnosed last visit.  My previous records were reviewed prior to todays visit as well as outside records available to me. We started her on levodopa last visit.  It is working well and "everyone tells me I am walking better."  Pt denies falls.  Pt denies lightheadedness, near syncope.  No hallucinations.  She started rock steady boxing and is doing that 3 days/week.  Current prescribed movement disorder medications: Carbidopa/levodopa 25/100, 1 tablet 3 times per day (started last visit)    ALLERGIES:   Allergies  Allergen Reactions   Crestor [Rosuvastatin] Other (See Comments)    Leg cramping    Demerol [Meperidine] Nausea And Vomiting   Latex Other (See Comments)    Redness, hands breakout   Lipitor [Atorvastatin] Other (See Comments)    pain    Lovastatin Other (See Comments)    Elevated CK   Oxycodone Nausea And Vomiting   Pravastatin Other (See Comments)    pain   Tramadol Nausea Only   Vicodin [Hydrocodone-Acetaminophen] Other (See Comments)    Felt weird    Zocor [Simvastatin] Other (See Comments)    pain    Gabapentin Rash    CURRENT MEDICATIONS:  No outpatient medications have been marked as taking for the 03/11/23 encounter (Office Visit) with , Octaviano Batty, DO.      Objective:   PHYSICAL EXAMINATION:    VITALS:   Vitals:   03/11/23 0846  BP: 130/62  Weight: 170 lb (77.1 kg)  Height: 5\' 2"  (1.575 m)    GEN:  The patient appears stated age and is in NAD. HEENT:  Normocephalic, atraumatic.  The mucous membranes are moist. The superficial temporal arteries are without ropiness or tenderness.  Neurological examination:  Orientation: The patient is alert and oriented x3. Cranial nerves: There is good facial symmetry with no significant facial hypomimia. The speech is fluent and clear. Soft palate rises symmetrically and there is no tongue deviation. Hearing is intact to conversational tone. Sensation: Sensation is intact to light touch throughout Motor: Strength is at least antigravity x4.  Movement examination: Tone: There is nl tone in the bilateral upper extremities.  The tone in the lower extremities is nl.  Abnormal movements: no UE/LE tremor (improved).    There is chin tremor Coordination:  There is no decremation with any form of RAMS, including alternating supination and pronation of the forearm, hand opening and closing, finger taps, heel taps and toe taps (improved).  Gait and Station: The patient pushes off to arise.  She doesn't shuffle.  She is just a bit short stepped.  She has virtually no arm swing bilaterally (improved)  I have reviewed and interpreted the following labs independently    Chemistry      Component Value  Date/Time   NA 142 02/25/2023 0829   K 3.5 02/25/2023 0829   CL 106 02/25/2023 0829   CO2 28 02/25/2023 0829   BUN 13 02/25/2023 0829   CREATININE 0.72 02/25/2023 0829      Component Value Date/Time   CALCIUM 9.0 02/25/2023 0829   ALKPHOS 73 02/25/2023 0829   AST 26 02/25/2023 0829   ALT 4 02/25/2023 0829   BILITOT 0.5 02/25/2023 0829       Lab Results  Component Value Date   WBC 5.2 02/25/2023   HGB 13.3 02/25/2023   HCT 40.8 02/25/2023   MCV 95.8 02/25/2023   PLT 216.0 02/25/2023     Lab Results  Component Value Date   TSH 1.95 05/18/2022     Total time spent on today's visit was 30 minutes, including both face-to-face time and nonface-to-face time.  Time included that spent on review of records (prior notes available to me/labs/imaging if pertinent), discussing treatment and goals, answering patient's questions and coordinating care.  Cc:  Dale El Lago, MD

## 2023-03-11 ENCOUNTER — Ambulatory Visit (INDEPENDENT_AMBULATORY_CARE_PROVIDER_SITE_OTHER): Payer: Medicare Other | Admitting: Neurology

## 2023-03-11 ENCOUNTER — Encounter: Payer: Self-pay | Admitting: Neurology

## 2023-03-11 VITALS — BP 130/62 | HR 96 | Ht 62.0 in | Wt 170.0 lb

## 2023-03-11 DIAGNOSIS — G20A1 Parkinson's disease without dyskinesia, without mention of fluctuations: Secondary | ICD-10-CM

## 2023-03-11 MED ORDER — CARBIDOPA-LEVODOPA 25-100 MG PO TABS: 1.0000 | ORAL_TABLET | Freq: Three times a day (TID) | ORAL | 1 refills | Status: DC

## 2023-03-11 NOTE — Patient Instructions (Signed)
 SAVE THE DATE!  We are planning a Parkinsons Disease educational symposium at [facility] in [place] on October 11.  More details to come!  If you would like to be added to our email list to get further information, email [email or web address].  We hope to see you there!

## 2023-03-16 ENCOUNTER — Ambulatory Visit: Payer: Medicare Other | Admitting: Neurology

## 2023-03-16 ENCOUNTER — Encounter: Payer: Self-pay | Admitting: Internal Medicine

## 2023-04-07 ENCOUNTER — Other Ambulatory Visit: Payer: Self-pay | Admitting: Oncology

## 2023-04-07 DIAGNOSIS — Z006 Encounter for examination for normal comparison and control in clinical research program: Secondary | ICD-10-CM

## 2023-06-14 ENCOUNTER — Encounter: Payer: Self-pay | Admitting: Family

## 2023-06-14 ENCOUNTER — Ambulatory Visit: Payer: Medicare Other | Admitting: Family

## 2023-06-14 VITALS — BP 114/64 | HR 81 | Temp 97.6°F | Ht 62.0 in | Wt 169.0 lb

## 2023-06-14 DIAGNOSIS — A499 Bacterial infection, unspecified: Secondary | ICD-10-CM | POA: Diagnosis not present

## 2023-06-14 DIAGNOSIS — R300 Dysuria: Secondary | ICD-10-CM | POA: Diagnosis not present

## 2023-06-14 DIAGNOSIS — N390 Urinary tract infection, site not specified: Secondary | ICD-10-CM

## 2023-06-14 LAB — POC URINALSYSI DIPSTICK (AUTOMATED)
Bilirubin, UA: POSITIVE
Glucose, UA: POSITIVE — AB
Ketones, UA: POSITIVE
Nitrite, UA: POSITIVE
Protein, UA: POSITIVE — AB
Spec Grav, UA: <=1.005 — AB (ref 1.010–1.025)
Urobilinogen, UA: >=8 U/dL — AB
pH, UA: 5 (ref 5.0–8.0)

## 2023-06-14 MED ORDER — SULFAMETHOXAZOLE-TRIMETHOPRIM 800-160 MG PO TABS: 1.0000 | ORAL_TABLET | Freq: Two times a day (BID) | ORAL | 0 refills | Status: DC

## 2023-06-14 NOTE — Addendum Note (Signed)
Addended by: Warden Fillers on: 06/14/2023 04:23 PM   Modules accepted: Orders

## 2023-06-14 NOTE — Progress Notes (Signed)
Acute Office Visit  Subjective:     Patient ID: Helen Marshall, female    DOB: Nov 20, 1945, 77 y.o.   MRN: [id number]  Chief Complaint  Patient presents with  . Urinary Tract Infection    HPI Patient is in today with c/o burning with urination, frequency, urgency, and nausea that began 4 days ago. She reports every time she urinates, she defecates a small amount. Energy level today is lower than previus days. No fever or chills.   Review of Systems  Constitutional:  Negative for chills and fever.  Respiratory: Negative.    Cardiovascular: Negative.   Gastrointestinal:  Positive for nausea. Negative for vomiting.  Genitourinary:  Positive for dysuria, frequency and urgency.  Musculoskeletal: Negative.   Neurological: Negative.   Endo/Heme/Allergies: Negative.   Psychiatric/Behavioral: Negative.    All other systems reviewed and are negative.      Objective:    BP 114/64   Pulse 81   Temp 97.6 F (36.4 C)   Ht 5\' 2"  (1.575 m)   Wt 169 lb (76.7 kg)   LMP 07/21/1988   SpO2 95%   BMI 30.91 kg/m    Physical Exam Vitals reviewed.  Constitutional:      Appearance: Normal appearance. She is obese.  Cardiovascular:     Rate and Rhythm: Normal rate and regular rhythm.     Pulses: Normal pulses.     Heart sounds: Normal heart sounds.  Pulmonary:     Effort: Pulmonary effort is normal.     Breath sounds: Normal breath sounds.  Abdominal:     General: Abdomen is flat. Bowel sounds are normal.     Palpations: Abdomen is soft.  Musculoskeletal:        General: Normal range of motion.  Skin:    General: Skin is warm and dry.  Neurological:     General: No focal deficit present.     Mental Status: She is alert and oriented to person, place, and time. Mental status is at baseline.  Psychiatric:        Mood and Affect: Mood normal.        Behavior: Behavior normal.   Results for orders placed or performed in visit on 06/14/23  POCT Urinalysis Dipstick (Automated)   Result Value Ref Range   Color, UA amber    Clarity, UA slightly cloudy    Glucose, UA Positive (A) Negative   Bilirubin, UA pos    Ketones, UA pos    Spec Grav, UA <=1.005 (A) 1.010 - 1.025   Blood, UA moderate    pH, UA 5.0 5.0 - 8.0   Protein, UA Positive (A) Negative   Urobilinogen, UA >=8.0 (A) 0.2 or 1.0 E.U./dL   Nitrite, UA pos    Leukocytes, UA Large (3+) (A) Negative        Assessment & Plan:   Problem List Items Addressed This Visit   None Visit Diagnoses     Bacterial urinary infection    -  Primary   Relevant Medications   sulfamethoxazole-trimethoprim (BACTRIM DS) 800-160 MG tablet   Other Relevant Orders   Urine Culture   POCT Urinalysis Dipstick (Automated) (Completed)   Dysuria       Relevant Orders   Urine Culture   POCT Urinalysis Dipstick (Automated) (Completed)       Meds ordered this encounter  Medications  . sulfamethoxazole-trimethoprim (BACTRIM DS) 800-160 MG tablet    Sig: Take 1 tablet by mouth 2 (two) times daily.  Dispense:  14 tablet    Refill:  0   Vital signs are stable. Afebrile. Drink plenty of water, add cranberry juice into your diet during this time. Call the office if symptoms worsen or persist. Recheck as scheduled and sooner as needed. No follow-ups on file.  Eulis Foster, FNP

## 2023-06-17 ENCOUNTER — Telehealth: Payer: Self-pay | Admitting: Internal Medicine

## 2023-06-17 NOTE — Telephone Encounter (Signed)
error 

## 2023-07-01 ENCOUNTER — Other Ambulatory Visit: Payer: Medicare Other

## 2023-07-04 ENCOUNTER — Other Ambulatory Visit: Payer: Medicare Other

## 2023-07-04 ENCOUNTER — Encounter: Payer: Self-pay | Admitting: Internal Medicine

## 2023-07-04 ENCOUNTER — Ambulatory Visit: Payer: Medicare Other | Admitting: Internal Medicine

## 2023-07-04 VITALS — BP 138/78 | HR 88 | Ht 62.0 in | Wt 165.4 lb

## 2023-07-04 DIAGNOSIS — N3000 Acute cystitis without hematuria: Secondary | ICD-10-CM

## 2023-07-04 DIAGNOSIS — R300 Dysuria: Secondary | ICD-10-CM

## 2023-07-04 DIAGNOSIS — N390 Urinary tract infection, site not specified: Secondary | ICD-10-CM | POA: Insufficient documentation

## 2023-07-04 DIAGNOSIS — R739 Hyperglycemia, unspecified: Secondary | ICD-10-CM | POA: Diagnosis not present

## 2023-07-04 DIAGNOSIS — F4329 Adjustment disorder with other symptoms: Secondary | ICD-10-CM

## 2023-07-04 DIAGNOSIS — E7800 Pure hypercholesterolemia, unspecified: Secondary | ICD-10-CM

## 2023-07-04 LAB — URINALYSIS, MICROSCOPIC ONLY

## 2023-07-04 LAB — POCT URINALYSIS DIPSTICK
Bilirubin, UA: NEGATIVE
Glucose, UA: NEGATIVE
Ketones, UA: NEGATIVE
Nitrite, UA: NEGATIVE
Protein, UA: NEGATIVE
Spec Grav, UA: 1.02 (ref 1.010–1.025)
Urobilinogen, UA: 0.2 U/dL
pH, UA: 7 (ref 5.0–8.0)

## 2023-07-04 LAB — HEPATIC FUNCTION PANEL
ALT: 4 U/L (ref 0–35)
AST: 19 U/L (ref 0–37)
Albumin: 4 g/dL (ref 3.5–5.2)
Alkaline Phosphatase: 80 U/L (ref 39–117)
Bilirubin, Direct: 0.1 mg/dL (ref 0.0–0.3)
Total Bilirubin: 0.5 mg/dL (ref 0.2–1.2)
Total Protein: 6.6 g/dL (ref 6.0–8.3)

## 2023-07-04 LAB — LIPID PANEL
Cholesterol: 204 mg/dL — ABNORMAL HIGH (ref 0–200)
HDL: 74.6 mg/dL (ref >39.00)
LDL Cholesterol: 109 mg/dL — ABNORMAL HIGH (ref 0–99)
NonHDL: 129.57
Total CHOL/HDL Ratio: 3
Triglycerides: 104 mg/dL (ref 0.0–149.0)
VLDL: 20.8 mg/dL (ref 0.0–40.0)

## 2023-07-04 LAB — BASIC METABOLIC PANEL
BUN: 10 mg/dL (ref 6–23)
CO2: 27 meq/L (ref 19–32)
Calcium: 9.1 mg/dL (ref 8.4–10.5)
Chloride: 103 meq/L (ref 96–112)
Creatinine, Ser: 0.69 mg/dL (ref 0.40–1.20)
GFR: 83.41 mL/min (ref >60.00)
Glucose, Bld: 101 mg/dL — ABNORMAL HIGH (ref 70–99)
Potassium: 3.6 meq/L (ref 3.5–5.1)
Sodium: 140 meq/L (ref 135–145)

## 2023-07-04 LAB — HEMOGLOBIN A1C: Hgb A1c MFr Bld: 5.8 % (ref 4.6–6.5)

## 2023-07-04 LAB — TSH: TSH: 1.38 u[IU]/mL (ref 0.35–5.50)

## 2023-07-04 MED ORDER — ONDANSETRON 4 MG PO TBDP: 4.0000 mg | ORAL_TABLET | Freq: Three times a day (TID) | ORAL | 0 refills | Status: DC | PRN

## 2023-07-04 NOTE — Patient Instructions (Addendum)
I did not prescribe any antibiotics today;  we will wait for the culture (24-to 48 hours)  to result  Zofran for nausea has been called in    You might want to try using Relaxium to help you sleep better (as seen on TV commercials) . It is available through [employer] and contains all natural supplements:  Melatonin 5 mg  Chamomile 25 mg Passionflower extract 75 mg GABA 100 mg Ashwaganda extract 125 mg Magnesium citrate, glycinate, oxide (100 mg)  L tryptophan 500 mg Valerest (proprietary  ingredient ; probably valeria root extract)

## 2023-07-04 NOTE — Progress Notes (Signed)
Subjective:  Patient ID: Helen Marshall, female    DOB: 1945-11-26  Age: 77 y.o. MRN: [id number]  CC: The primary encounter diagnosis was Dysuria. Diagnoses of Hypercholesterolemia, Hyperglycemia, Acute cystitis without hematuria, and Grief reaction with prolonged bereavement were also pertinent to this visit.   HPI JACQLYNN NEVINS presents for  Chief Complaint  Patient presents with   Dysuria    Frequent urination and painful urination    Treated empirically for UTI with Septra on  Nov 12   after presenting with   dysuria and nausea of 4 days duration.  Symptoms resolved but she had to stop the sulfa (empirically prescribed) after  3.5 days due to  persistent nausea and despite taking the medication  after food, followed by a diffuse body rash . She states that she was treated rudely when she called back with  reports of intolerance .  She currently denies dysuria The nausea has resolved but she feels gassy and constipated from holiday food.  . .     2) Trouble maintaining sleep . Stil grieving the loss of her daughter in February.  Had a full house oveor the holidays .  Has tried melatonin   Outpatient Medications Prior to Visit  Medication Sig Dispense Refill   albuterol (VENTOLIN HFA) 108 (90 Base) MCG/ACT inhaler Inhale 2 puffs into the lungs every 6 (six) hours as needed for wheezing or shortness of breath. 18 g 1   amLODipine (NORVASC) 5 MG tablet Take 1 tablet (5 mg total) by mouth daily. 90 tablet 3   aspirin EC 325 MG tablet Take 325 mg by mouth daily.     benazepril (LOTENSIN) 40 MG tablet TAKE 1 TABLET BY MOUTH DAILY 90 tablet 3   Calcium Carb-Cholecalciferol (CALCIUM 600 + D PO) Take 1 tablet by mouth daily.     carbidopa-levodopa (SINEMET IR) 25-100 MG tablet Take 1 tablet by mouth 3 (three) times daily. 8am/noon/4pm 270 tablet 1   ezetimibe (ZETIA) 10 MG tablet Take 1 tablet (10 mg total) by mouth daily. 90 tablet 2   fish oil-omega-3 fatty acids 1000 MG capsule Take 2 g  by mouth 2 (two) times daily.     fluticasone (FLONASE) 50 MCG/ACT nasal spray Place 2 sprays into both nostrils as needed. 48 g 3   ibuprofen (ADVIL) 200 MG tablet Take 400 mg by mouth every 6 (six) hours as needed for moderate pain.     lidocaine (LIDODERM) 5 % Place 1 patch onto the skin daily as needed (pain). Remove & Discard patch within 12 hours or as directed by MD     loratadine (CLARITIN) 10 MG tablet Take 10 mg by mouth daily as needed for allergies.     Multiple Vitamin (MULTIVITAMIN) tablet Take 1 tablet by mouth daily.     niacin (VITAMIN B3) 500 MG tablet Take 1,000 mg by mouth 2 (two) times daily.     Povidone, PF, (IVIZIA DRY EYES) 0.5 % SOLN Place 1 drop into both eyes 2 (two) times daily.     Probiotic Product (VSL#3 PO) Take 1 capsule by mouth daily.     Simethicone (GAS-X PO) Take 2 tablets by mouth daily as needed (gas).     tiZANidine (ZANAFLEX) 4 MG tablet Take 1 tablet (4 mg total) by mouth every 6 (six) hours as needed for muscle spasms. 60 tablet 0   triamcinolone cream (KENALOG) 0.1 % Apply 1 Application topically as needed (psoriasis). 30 g 0   sulfamethoxazole-trimethoprim (  BACTRIM DS) 800-160 MG tablet Take 1 tablet by mouth 2 (two) times daily. (Patient not taking: Reported on 07/04/2023) 14 tablet 0   No facility-administered medications prior to visit.    Review of Systems;  Patient denies headache, fevers, malaise, unintentional weight loss, skin rash, eye pain, sinus congestion and sinus pain, sore throat, dysphagia,  hemoptysis , cough, dyspnea, wheezing, chest pain, palpitations, orthopnea, edema, abdominal pain, nausea, melena, diarrhea, constipation, flank pain, dysuria, hematuria, urinary  Frequency, nocturia, numbness, tingling, seizures,  Focal weakness, Loss of consciousness,  Tremor, insomnia, depression, anxiety, and suicidal ideation.      Objective:  BP 138/78   Pulse 88   Ht 5\' 2"  (1.575 m)   Wt 165 lb 6.4 oz (75 kg)   LMP 07/21/1988   SpO2  93%   BMI 30.25 kg/m   BP Readings from Last 3 Encounters:  07/04/23 138/78  06/14/23 114/64  03/11/23 130/62    Wt Readings from Last 3 Encounters:  07/04/23 165 lb 6.4 oz (75 kg)  06/14/23 169 lb (76.7 kg)  03/11/23 170 lb (77.1 kg)    Physical Exam Vitals reviewed.  Constitutional:      General: She is not in acute distress.    Appearance: Normal appearance. She is normal weight. She is not ill-appearing, toxic-appearing or diaphoretic.  HENT:     Head: Normocephalic.  Eyes:     General: No scleral icterus.       Right eye: No discharge.        Left eye: No discharge.     Conjunctiva/sclera: Conjunctivae normal.  Musculoskeletal:        General: Normal range of motion.  Skin:    General: Skin is warm and dry.  Neurological:     General: No focal deficit present.     Mental Status: She is alert and oriented to person, place, and time. Mental status is at baseline.  Psychiatric:        Mood and Affect: Mood normal.        Behavior: Behavior normal.        Thought Content: Thought content normal.        Judgment: Judgment normal.      Lab Results  Component Value Date   HGBA1C 5.8 07/04/2023   HGBA1C 5.7 02/25/2023   HGBA1C 5.8 09/16/2022    Lab Results  Component Value Date   CREATININE 0.69 07/04/2023   CREATININE 0.72 02/25/2023   CREATININE 0.67 09/16/2022    Lab Results  Component Value Date   WBC 5.2 02/25/2023   HGB 13.3 02/25/2023   HCT 40.8 02/25/2023   PLT 216.0 02/25/2023   GLUCOSE 101 (H) 07/04/2023   CHOL 204 (H) 07/04/2023   TRIG 104.0 07/04/2023   HDL 74.60 07/04/2023   LDLDIRECT 101.4 05/07/2013   LDLCALC 109 (H) 07/04/2023   ALT 4 07/04/2023   AST 19 07/04/2023   NA 140 07/04/2023   K 3.6 07/04/2023   CL 103 07/04/2023   CREATININE 0.69 07/04/2023   BUN 10 07/04/2023   CO2 27 07/04/2023   TSH 1.38 07/04/2023   HGBA1C 5.8 07/04/2023   MICROALBUR <0.7 04/19/2018    MR BRAIN WO CONTRAST  Result Date:  07/10/2022 CLINICAL DATA:  Tremor on the right side of the body. EXAM: MRI HEAD WITHOUT CONTRAST TECHNIQUE: Multiplanar, multiecho pulse sequences of the brain and surrounding structures were obtained without intravenous contrast. COMPARISON:  None Available. FINDINGS: Brain: No acute infarction, hemorrhage, hydrocephalus, extra-axial collection  or mass lesion. Vascular: Normal flow voids. Skull and upper cervical spine: Normal marrow signal. Sinuses/Orbits: Bilateral lens replacement. Polypoid mucosal thickening bilateral maxillary sinuses. Other: None. IMPRESSION: No definite finding to explain right-sided hand tremors. Electronically Signed   By: Lorenza Cambridge M.D.   On: 07/10/2022 13:58    Assessment & Plan:  .Dysuria -     POCT urinalysis dipstick -     Urine Microscopic -     Urine Culture  Hypercholesterolemia -     TSH -     Basic metabolic panel -     Hepatic function panel -     Lipid panel  Hyperglycemia -     Hemoglobin A1c  Acute cystitis without hematuria Assessment & Plan: Her last urine culture was inconclusive and her symptoms have resolved after 3 days of Septra . However repeat UA is suggestive of persistent infection.  culture ordered    Grief reaction with prolonged bereavement Assessment & Plan: Counselling given. Natural remedies for insomnia advised    Other orders -     Ondansetron; Take 1 tablet (4 mg total) by mouth every 8 (eight) hours as needed for nausea or vomiting.  Dispense: 20 tablet; Refill: 0     I provided 30 minutes of face-to-face time during this encounter reviewing patient's last visit with me, patient's  most recent visit with Dr Lorin Picket.  recent surgical and non surgical procedures, previous  labs and imaging studies, counseling on currently addressed issues,  and post visit ordering to diagnostics and therapeutics .   Follow-up: No follow-ups on file.   Sherlene Shams, MD

## 2023-07-04 NOTE — Assessment & Plan Note (Signed)
Counselling given. Natural remedies for insomnia advised

## 2023-07-04 NOTE — Assessment & Plan Note (Addendum)
Her last urine culture was inconclusive and her symptoms have resolved after 3 days of Septra . However repeat UA is suggestive of persistent infection.  culture suggest E Col.  Will treat with cipro

## 2023-07-05 ENCOUNTER — Encounter: Payer: Self-pay | Admitting: Internal Medicine

## 2023-07-05 ENCOUNTER — Ambulatory Visit: Payer: Medicare Other | Admitting: Internal Medicine

## 2023-07-05 VITALS — BP 122/70 | HR 81 | Temp 98.0°F | Resp 16 | Ht 62.5 in | Wt 166.6 lb

## 2023-07-05 DIAGNOSIS — D0512 Intraductal carcinoma in situ of left breast: Secondary | ICD-10-CM | POA: Diagnosis not present

## 2023-07-05 DIAGNOSIS — E7800 Pure hypercholesterolemia, unspecified: Secondary | ICD-10-CM

## 2023-07-05 DIAGNOSIS — R911 Solitary pulmonary nodule: Secondary | ICD-10-CM

## 2023-07-05 DIAGNOSIS — G629 Polyneuropathy, unspecified: Secondary | ICD-10-CM

## 2023-07-05 DIAGNOSIS — R739 Hyperglycemia, unspecified: Secondary | ICD-10-CM

## 2023-07-05 DIAGNOSIS — N3000 Acute cystitis without hematuria: Secondary | ICD-10-CM

## 2023-07-05 DIAGNOSIS — F4329 Adjustment disorder with other symptoms: Secondary | ICD-10-CM

## 2023-07-05 DIAGNOSIS — I10 Essential (primary) hypertension: Secondary | ICD-10-CM

## 2023-07-05 DIAGNOSIS — R142 Eructation: Secondary | ICD-10-CM

## 2023-07-05 DIAGNOSIS — K9289 Other specified diseases of the digestive system: Secondary | ICD-10-CM

## 2023-07-05 DIAGNOSIS — G20A1 Parkinson's disease without dyskinesia, without mention of fluctuations: Secondary | ICD-10-CM

## 2023-07-05 DIAGNOSIS — Z853 Personal history of malignant neoplasm of breast: Secondary | ICD-10-CM

## 2023-07-05 DIAGNOSIS — N644 Mastodynia: Secondary | ICD-10-CM

## 2023-07-05 MED ORDER — SERTRALINE HCL 25 MG PO TABS: 25.0000 mg | ORAL_TABLET | Freq: Every day | ORAL | 2 refills | Status: DC

## 2023-07-05 NOTE — Assessment & Plan Note (Signed)
The 10-year ASCVD risk score (Arnett DK, et al., 2019) is: 30%   Values used to calculate the score:     Age: 77 years     Sex: Female     Is Non-Hispanic African American: No     Diabetic: No     Tobacco smoker: No     Systolic Blood Pressure: 138 mmHg     Is BP treated: Yes     HDL Cholesterol: 74.6 mg/dL     Total Cholesterol: 204 mg/dL  Intolerant to statin.  On zetia.

## 2023-07-05 NOTE — Patient Instructions (Signed)
Alpha lipoic acid - 600mg  - take one tablet per day.   Pepcid 20mg  - take 1 tablet 30 minutes before breakfast.

## 2023-07-05 NOTE — Progress Notes (Signed)
Subjective:    Patient ID: Helen Marshall, female    DOB: [birth year], 77 y.o.   MRN: [id number]  Patient here for  Chief Complaint  Patient presents with   Medical Management of Chronic Issues    HPI Here for f/u regarding hypercholesterolemia, hypertension and hyperglycemia. Was seeing Dr Lemar Livings - f/u breast cancer. He retired. Recommended continued annual screening through PCP. Was evaluated by Dr Tat for tremor. Diagnosed with tremor predominant idiopathic parkinson's disease. Started on sinemet. [facility]. She is participating in boxing - here locally. Continuing to exercise. Just evaluated yesterday with concerns regarding UTI. Some increased urinary frequency. Taking cranberry supplements.  Urine culture pending. Noticed increased burping and increased gas. Taking probiotics. No abdominal pain.  Increased stress.  Trying to cope with her daughter's passing. Discussed further treatment/counseling.    Past Medical History:  Diagnosis Date   Abnormal LFTs    Abnormal mammogram 12/19/2012   left   Allergy    Anemia    Arthritis    Cancer [facility]) 2014   left Breast   Carotid bruit    left   Cataract    Colon polyps    Diverticulosis 2012   Environmental allergies    Family history of adverse reaction to anesthesia    her aunt was hard to wake up   GERD (gastroesophageal reflux disease)    Heart murmur    History of diverticulitis 05/2021   Hypercholesterolemia    Hyperglycemia    Hypertension    IBS (irritable bowel syndrome)    Lipoma of colon    Lumbar disc disease    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   Pre-diabetes    Tubular adenoma of colon    Umbilical hernia 05/2021   Past Surgical  History:  Procedure Laterality Date   ABDOMINAL HYSTERECTOMY  1989   fibroids   ANKLE SURGERY Left 1999   ANTERIOR AND POSTERIOR REPAIR WITH SACROSPINOUS FIXATION N/A 07/20/2021   Procedure: ANTERIOR AND REPAIR WITH SACROSPINOUS FIXATION;  Surgeon: Marguerita Beards, MD;  Location[facility];  Service: Gynecology;  Laterality: N/A;   BREAST SURGERY Left 1970   biopsy   CATARACT EXTRACTION, BILATERAL  June & July 2017   CHOLECYSTECTOMY  2001   COLONOSCOPY  06/02/2011   Lutricia Feil, MD; diverticulosis, submucosal lipoma of the sigmoid colon.   CYSTOSCOPY N/A 07/20/2021   Procedure: CYSTOSCOPY;  Surgeon: Marguerita Beards, MD;  Location: [facility];  Service: Gynecology;  Laterality: N/A;   eyelid lift Bilateral    HERNIA REPAIR  2001, 2004   HERNIA REPAIR  01/22/2013   Repeat repair of umbilical defect, 2.5 cm. Primary repair.   INSERTION OF MESH N/A 03/31/2022   Procedure: INSERTION OF MESH;  Surgeon: [name], MD;  Location: [facility] ORS;  Service: General;  Laterality: N/A;   LAPAROSCOPIC HYSTERECTOMY     LUMBAR LAMINECTOMY/DECOMPRESSION MICRODISCECTOMY Bilateral 06/18/2022   Procedure: Bilateral L3-4 Lumbar decompression;  Surgeon: Coletta Memos, MD;  Location[facility] OR;  Service: Neurosurgery;  Laterality: Bilateral;   NASAL SINUS SURGERY  1997   rotator cuff surgery  1998   SQUAMOUS CELL CARCINOMA EXCISION Right 05/2013   shoulder   TUBAL LIGATION  1976  UPPER GI ENDOSCOPY  08/22/2014   Dr Shela Commons. Rhea Belton   VENTRAL HERNIA REPAIR N/A 03/31/2022   Procedure: HERNIA REPAIR VENTRAL ADULT;  Surgeon: [name], MD;  Location: [facility] OR[facility];  Service: General;  Laterality: N/A;  Sonda Rumble, RNFA to assist TAPP block per anesthesia   Family History  Problem Relation Age of Onset   Heart disease Mother        myocardial infarction   Diabetes Mother    Hearing loss Mother    CVA Father    Alcohol abuse Father    Arthritis/Rheumatoid  Sister        x2   Breast cancer Sister 8   Arthritis Sister    Hearing loss Sister    Arthritis Sister    Hearing loss Sister    Asthma Sister    Arthritis Sister    Hearing loss Sister    Hypothyroidism Sister    Arthritis Sister    Hearing loss Sister    Colon polyps Sister    Arthritis Sister    Hearing loss Sister    Hypertension Brother        x2   Arthritis Brother    Hearing loss Brother    Alcohol abuse Brother    Arthritis Brother    Hearing loss Brother    Colon polyps Son    Alcohol abuse Son    Colon cancer Maternal Uncle    Cancer Other        breast - paternal first cousin   Stroke Daughter    Social History   Socioeconomic History   Marital status: Married    Spouse name: ,[name]   Number of children: 2   Years of education: Not on file   Highest education level: Not on file  Occupational History   Occupation: Retired    Comment: Designer, 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  Tobacco Use   Smoking status: Never   Smokeless tobacco: Never  Vaping Use   Vaping status: Never Used  Substance and Sexual Activity   Alcohol use: No    Alcohol/week: 0.0 standard drinks of alcohol   Drug use: No   Sexual activity: Not Currently  Other Topics Concern   Not on file  Social History Narrative   Right handed   Retired    International aid/development worker of Health   Financial Resource Strain: Low Risk  (02/15/2023)   Overall Financial Resource Strain (CARDIA)    Difficulty of Paying Living Expenses: Not hard at all  Food Insecurity: No Food Insecurity (02/15/2023)   Hunger Vital Sign    Worried About Running Out of Food in the Last Year: Never true    Ran Out of Food in the Last Year: Never true  Transportation Needs: No Transportation Needs (02/15/2023)   PRAPARE - Administrator, Civil Service (Medical): No    Lack of Transportation (Non-Medical): No  Physical Activity: Sufficiently Active (02/15/2023)   Exercise Vital Sign    Days of Exercise per Week: 3 days    Minutes  of Exercise per Session: 60 min  Stress: No Stress Concern Present (02/15/2023)   [employer] of Occupational Health - Occupational Stress Questionnaire    Feeling of Stress : Not at all  Social Connections: Socially Integrated (02/15/2023)   Social Connection and Isolation Panel [NHANES]    Frequency of Communication with Friends and Family: More than three times a week    Frequency of Social Gatherings with Friends and Family: Once a  week    Attends Religious Services: More than 4 times per year    Active Member of Clubs or Organizations: Yes    Attends Banker Meetings: More than 4 times per year    Marital Status: Married     Review of Systems  Constitutional:  Negative for appetite change and unexpected weight change.  HENT:  Negative for congestion and sinus pressure.   Respiratory:  Negative for cough, chest tightness and shortness of breath.   Cardiovascular:  Negative for chest pain and palpitations.  Gastrointestinal:  Negative for abdominal pain, diarrhea, nausea and vomiting.       Increased burping.  Increased gas.   Genitourinary:  Positive for frequency. Negative for difficulty urinating and dysuria.  Musculoskeletal:  Negative for joint swelling and myalgias.  Skin:  Negative for color change and rash.  Neurological:  Negative for dizziness and headaches.  Psychiatric/Behavioral:  Negative for agitation and dysphoric mood.        Objective:     BP 122/70   Pulse 81   Temp 98 F (36.7 C)   Resp 16   Ht 5' 2.5" (1.588 m)   Wt 166 lb 9.6 oz (75.6 kg)   LMP 07/21/1988   SpO2 98%   BMI 29.99 kg/m  Wt Readings from Last 3 Encounters:  07/05/23 166 lb 9.6 oz (75.6 kg)  07/04/23 165 lb 6.4 oz (75 kg)  06/14/23 169 lb (76.7 kg)    Physical Exam Vitals reviewed.  Constitutional:      General: She is not in acute distress.    Appearance: Normal appearance.  HENT:     Head: Normocephalic and atraumatic.     Right Ear: External ear normal.      Left Ear: External ear normal.  Eyes:     General: No scleral icterus.       Right eye: No discharge.        Left eye: No discharge.     Conjunctiva/sclera: Conjunctivae normal.  Neck:     Thyroid: No thyromegaly.  Cardiovascular:     Rate and Rhythm: Normal rate and regular rhythm.  Pulmonary:     Effort: No respiratory distress.     Breath sounds: Normal breath sounds. No wheezing.     Comments: Breasts:  left breast -no nipple discharge or nipple retraction present. Could not appreciate distinct nodule or axillary adenopathy. Right breast - increased tenderness - 2-3:00 right breast.  Abdominal:     General: Bowel sounds are normal.     Palpations: Abdomen is soft.     Tenderness: There is no abdominal tenderness.  Musculoskeletal:        General: No swelling or tenderness.     Cervical back: Neck supple. No tenderness.  Lymphadenopathy:     Cervical: No cervical adenopathy.  Skin:    Findings: No erythema or rash.  Neurological:     Mental Status: She is alert.  Psychiatric:        Mood and Affect: Mood normal.        Behavior: Behavior normal.      Outpatient Encounter Medications as of 07/05/2023  Medication Sig   albuterol (VENTOLIN HFA) 108 (90 Base) MCG/ACT inhaler Inhale 2 puffs into the lungs every 6 (six) hours as needed for wheezing or shortness of breath.   amLODipine (NORVASC) 5 MG tablet Take 1 tablet (5 mg total) by mouth daily.   aspirin EC 325 MG tablet Take 325 mg by mouth daily.  benazepril (LOTENSIN) 40 MG tablet TAKE 1 TABLET BY MOUTH DAILY   Calcium Carb-Cholecalciferol (CALCIUM 600 + D PO) Take 1 tablet by mouth daily.   carbidopa-levodopa (SINEMET IR) 25-100 MG tablet Take 1 tablet by mouth 3 (three) times daily. 8am/noon/4pm   ezetimibe (ZETIA) 10 MG tablet Take 1 tablet (10 mg total) by mouth daily.   fish oil-omega-3 fatty acids 1000 MG capsule Take 2 g by mouth 2 (two) times daily.   fluticasone (FLONASE) 50 MCG/ACT nasal spray Place 2  sprays into both nostrils as needed.   ibuprofen (ADVIL) 200 MG tablet Take 400 mg by mouth every 6 (six) hours as needed for moderate pain.   lidocaine (LIDODERM) 5 % Place 1 patch onto the skin daily as needed (pain). Remove & Discard patch within 12 hours or as directed by MD   loratadine (CLARITIN) 10 MG tablet Take 10 mg by mouth daily as needed for allergies.   Multiple Vitamin (MULTIVITAMIN) tablet Take 1 tablet by mouth daily.   niacin (VITAMIN B3) 500 MG tablet Take 1,000 mg by mouth 2 (two) times daily.   ondansetron (ZOFRAN-ODT) 4 MG disintegrating tablet Take 1 tablet (4 mg total) by mouth every 8 (eight) hours as needed for nausea or vomiting.   Povidone, PF, (IVIZIA DRY EYES) 0.5 % SOLN Place 1 drop into both eyes 2 (two) times daily.   Probiotic Product (VSL#3 PO) Take 1 capsule by mouth daily.   sertraline (ZOLOFT) 25 MG tablet Take 1 tablet (25 mg total) by mouth daily.   Simethicone (GAS-X PO) Take 2 tablets by mouth daily as needed (gas).   tiZANidine (ZANAFLEX) 4 MG tablet Take 1 tablet (4 mg total) by mouth every 6 (six) hours as needed for muscle spasms.   triamcinolone cream (KENALOG) 0.1 % Apply 1 Application topically as needed (psoriasis).   No facility-administered encounter medications on file as of 07/05/2023.     Lab Results  Component Value Date   WBC 5.2 02/25/2023   HGB 13.3 02/25/2023   HCT 40.8 02/25/2023   PLT 216.0 02/25/2023   GLUCOSE 101 (H) 07/04/2023   CHOL 204 (H) 07/04/2023   TRIG 104.0 07/04/2023   HDL 74.60 07/04/2023   LDLDIRECT 101.4 05/07/2013   LDLCALC 109 (H) 07/04/2023   ALT 4 07/04/2023   AST 19 07/04/2023   NA 140 07/04/2023   K 3.6 07/04/2023   CL 103 07/04/2023   CREATININE 0.69 07/04/2023   BUN 10 07/04/2023   CO2 27 07/04/2023   TSH 1.38 07/04/2023   HGBA1C 5.8 07/04/2023   MICROALBUR <0.7 04/19/2018    MR BRAIN WO CONTRAST  Result Date: 07/10/2022 CLINICAL DATA:  Tremor on the right side of the body. EXAM: MRI HEAD  WITHOUT CONTRAST TECHNIQUE: Multiplanar, multiecho pulse sequences of the brain and surrounding structures were obtained without intravenous contrast. COMPARISON:  None Available. FINDINGS: Brain: No acute infarction, hemorrhage, hydrocephalus, extra-axial collection or mass lesion. Vascular: Normal flow voids. Skull and upper cervical spine: Normal marrow signal. Sinuses/Orbits: Bilateral lens replacement. Polypoid mucosal thickening bilateral maxillary sinuses. Other: None. IMPRESSION: No definite finding to explain right-sided hand tremors. Electronically Signed   By: Lorenza Cambridge M.D.   On: 07/10/2022 13:58       Assessment & Plan:  Hypercholesterolemia Assessment & Plan: The 10-year ASCVD risk score (Arnett DK, et al., 2019) is: 30%   Values used to calculate the score:     Age: 77 years     Sex: Female  Is Non-Hispanic African American: No     Diabetic: No     Tobacco smoker: No     Systolic Blood Pressure: 138 mmHg     Is BP treated: Yes     HDL Cholesterol: 74.6 mg/dL     Total Cholesterol: 204 mg/dL  Intolerant to statin.  On zetia.    Acute cystitis without hematuria Assessment & Plan: Evaluated yesterday for increased urinary frequency and concerns regarding UTI.  Taking cranberry supplements. Await culture results.    Parkinson's disease, unspecified whether dyskinesia present, unspecified whether manifestations fluctuate [facility]) Assessment & Plan: Dr Tat 09/2022 - diagnosed.  Started sinemet. Doing well. Helping.  Tremor better.  Boxing.  Follow.    Neoplasm of left breast, primary tumor 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) Assessment & Plan:  Was seeing Dr Lemar Livings - f/u breast cancer. He retired. Recommended continued annual screening through PCP.    Lung nodule Assessment & Plan:  Incidental finding CT abdomen 04/26/21.  Had f/u chest CT 10/02/21 - stable 4mm right lower lobe pulmonary nodule.  Consider f/u CT.    Primary hypertension Assessment  & Plan: Continue amlodipine, benazepril.  Blood pressure has been under good control on this regimen.  Follow pressures.  Follow metabolic panel.     Hyperglycemia Assessment & Plan: Low carb diet and exercise.  Follow met b and a1c.   Lab Results  Component Value Date   HGBA1C 5.8 07/04/2023     History of breast cancer Assessment & Plan: Mammogram 01/05/23 - Birads I.    Grief reaction with prolonged bereavement Assessment & Plan: Discussed further treatment, including medication and counseling.  Trial of zoloft - low dose.  Start 25mg  q day.  Follow.    Increased gastric tonus  Belching Assessment & Plan: Increased belching and gas. Start pepcid. Monitor for food triggers. Follow.    Breast tenderness Assessment & Plan: Right breast tenderness - 2-3:00. Obtain right breast mammogram with possible ultrasound.   Orders: -     MM 3D DIAGNOSTIC MAMMOGRAM UNILATERAL RIGHT BREAST; Future -     [place] LIMITED ULTRASOUND INCLUDING AXILLA RIGHT BREAST; Future  Neuropathy Assessment & Plan: Trial of alpha lipoic acid daily.  Follow.    Other orders -     Sertraline HCl; Take 1 tablet (25 mg total) by mouth daily.  Dispense: 30 tablet; Refill: 2     Dale Guernsey, MD

## 2023-07-06 LAB — URINE CULTURE
MICRO NUMBER:: 15796582
SPECIMEN QUALITY:: ADEQUATE

## 2023-07-06 MED ORDER — CIPROFLOXACIN HCL 250 MG PO TABS
250.0000 mg | ORAL_TABLET | Freq: Two times a day (BID) | ORAL | 0 refills | Status: AC
Start: 2023-07-06 — End: 2023-07-09

## 2023-07-06 NOTE — Addendum Note (Signed)
Addended by: Sherlene Shams on: 07/06/2023 08:37 PM   Modules accepted: Orders

## 2023-07-09 ENCOUNTER — Telehealth: Payer: Self-pay | Admitting: Internal Medicine

## 2023-07-09 ENCOUNTER — Encounter: Payer: Self-pay | Admitting: Internal Medicine

## 2023-07-09 DIAGNOSIS — R142 Eructation: Secondary | ICD-10-CM | POA: Insufficient documentation

## 2023-07-09 DIAGNOSIS — G629 Polyneuropathy, unspecified: Secondary | ICD-10-CM | POA: Insufficient documentation

## 2023-07-09 DIAGNOSIS — N644 Mastodynia: Secondary | ICD-10-CM | POA: Insufficient documentation

## 2023-07-09 NOTE — Telephone Encounter (Signed)
Pt with history of breast cancer. Recent exam - tenderness - right breast.  I ordered right breast diagnostic mammogram and ultrasound.  She gets her mammograms through [facility].  How do we get this scheduled?

## 2023-07-09 NOTE — Assessment & Plan Note (Signed)
Dr Tat 09/2022 - diagnosed.  Started sinemet. Doing well. Helping.  Tremor better.  Boxing.  Follow.

## 2023-07-09 NOTE — Assessment & Plan Note (Signed)
Evaluated yesterday for increased urinary frequency and concerns regarding UTI.  Taking cranberry supplements. Await culture results.

## 2023-07-09 NOTE — Assessment & Plan Note (Signed)
Right breast tenderness - 2-3:00. Obtain right breast mammogram with possible ultrasound.

## 2023-07-09 NOTE — Assessment & Plan Note (Signed)
Discussed further treatment, including medication and counseling.  Trial of zoloft - low dose.  Start 25mg  q day.  Follow.

## 2023-07-09 NOTE — Assessment & Plan Note (Signed)
Increased belching and gas. Start pepcid. Monitor for food triggers. Follow.

## 2023-07-09 NOTE — Assessment & Plan Note (Signed)
Mammogram 01/05/23 - Birads I.

## 2023-07-09 NOTE — Assessment & Plan Note (Signed)
Incidental finding CT abdomen 04/26/21.  Had f/u chest CT 10/02/21 - stable 4mm right lower lobe pulmonary nodule.  Consider f/u CT.

## 2023-07-09 NOTE — Assessment & Plan Note (Signed)
Low carb diet and exercise.  Follow met b and a1c.   Lab Results  Component Value Date   HGBA1C 5.8 07/04/2023

## 2023-07-09 NOTE — Assessment & Plan Note (Signed)
Trial of alpha lipoic acid daily.  Follow.

## 2023-07-09 NOTE — Assessment & Plan Note (Signed)
Continue amlodipine, benazepril.  Blood pressure has been under good control on this regimen.  Follow pressures.  Follow metabolic panel.   

## 2023-07-09 NOTE — Assessment & Plan Note (Signed)
Was seeing Dr Lemar Livings - f/u breast cancer. He retired. Recommended continued annual screening through PCP.

## 2023-07-11 ENCOUNTER — Other Ambulatory Visit: Payer: Self-pay | Admitting: Internal Medicine

## 2023-07-11 NOTE — Telephone Encounter (Signed)
Can you assist with this ? 

## 2023-07-11 NOTE — Telephone Encounter (Signed)
Order printed and placed in Dr. Lorin Picket quick sign folder

## 2023-07-12 NOTE — Telephone Encounter (Signed)
Order faxed.

## 2023-07-12 NOTE — Telephone Encounter (Signed)
Rx sent in for albuterol inhaler #1 with one refill.

## 2023-08-22 ENCOUNTER — Ambulatory Visit: Payer: Medicare Other | Admitting: Internal Medicine

## 2023-08-22 ENCOUNTER — Encounter: Payer: Self-pay | Admitting: Internal Medicine

## 2023-08-22 VITALS — BP 126/70 | HR 69 | Temp 98.0°F | Resp 16 | Ht 62.75 in | Wt 164.0 lb

## 2023-08-22 DIAGNOSIS — E7800 Pure hypercholesterolemia, unspecified: Secondary | ICD-10-CM

## 2023-08-22 DIAGNOSIS — F4329 Adjustment disorder with other symptoms: Secondary | ICD-10-CM

## 2023-08-22 DIAGNOSIS — R739 Hyperglycemia, unspecified: Secondary | ICD-10-CM

## 2023-08-22 DIAGNOSIS — Z9109 Other allergy status, other than to drugs and biological substances: Secondary | ICD-10-CM

## 2023-08-22 DIAGNOSIS — H938X1 Other specified disorders of right ear: Secondary | ICD-10-CM | POA: Diagnosis not present

## 2023-08-22 DIAGNOSIS — N644 Mastodynia: Secondary | ICD-10-CM

## 2023-08-22 DIAGNOSIS — I10 Essential (primary) hypertension: Secondary | ICD-10-CM | POA: Diagnosis not present

## 2023-08-22 DIAGNOSIS — G20A1 Parkinson's disease without dyskinesia, without mention of fluctuations: Secondary | ICD-10-CM

## 2023-08-22 DIAGNOSIS — R142 Eructation: Secondary | ICD-10-CM

## 2023-08-22 DIAGNOSIS — R911 Solitary pulmonary nodule: Secondary | ICD-10-CM

## 2023-08-22 DIAGNOSIS — R918 Other nonspecific abnormal finding of lung field: Secondary | ICD-10-CM

## 2023-08-22 MED ORDER — BENAZEPRIL HCL 40 MG PO TABS: 40.0000 mg | ORAL_TABLET | Freq: Every day | ORAL | 3 refills | Status: DC

## 2023-08-22 MED ORDER — EZETIMIBE 10 MG PO TABS: 10.0000 mg | ORAL_TABLET | Freq: Every day | ORAL | 2 refills | Status: DC

## 2023-08-22 MED ORDER — SERTRALINE HCL 25 MG PO TABS: 25.0000 mg | ORAL_TABLET | Freq: Every day | ORAL | 2 refills | Status: DC

## 2023-08-22 NOTE — Assessment & Plan Note (Signed)
The 10-year ASCVD risk score (Arnett DK, et al., 2019) is: 28.5%   Values used to calculate the score:     Age: 78 years     Sex: Female     Is Non-Hispanic African American: No     Diabetic: No     Tobacco smoker: No     Systolic Blood Pressure: 126 mmHg     Is BP treated: Yes     HDL Cholesterol: 74.6 mg/dL     Total Cholesterol: 204 mg/dL  Intolerant to statin.  On zetia.

## 2023-08-22 NOTE — Assessment & Plan Note (Signed)
Incidental finding CT abdomen 04/26/21.  Had f/u chest CT 10/02/21 - stable 4mm right lower lobe pulmonary nodule.  Discussed f/u chest CT.  Agreeable.  Schedule.

## 2023-08-22 NOTE — Assessment & Plan Note (Signed)
Doing better. Continue zoloft. She does not feel needs any further intervention. Follow.

## 2023-08-22 NOTE — Assessment & Plan Note (Signed)
Intermittent.  No fullness today. Does report some drainage/runny nose. Steroid nasal spray as directed. Ear clear.  Follow.

## 2023-08-22 NOTE — Assessment & Plan Note (Signed)
Steroid nasal spray as directed.

## 2023-08-22 NOTE — Assessment & Plan Note (Signed)
Dr Tat 09/2022 - diagnosed.  Started sinemet. Doing well. Helping.  Tremor better.  Boxing.  Follow.

## 2023-08-22 NOTE — Assessment & Plan Note (Signed)
Continue amlodipine, benazepril.  Blood pressure has been under good control on this regimen.  Follow pressures.  Follow metabolic panel.   

## 2023-08-22 NOTE — Assessment & Plan Note (Signed)
 Low carb diet and exercise.  Follow met b and a1c.   Lab Results  Component Value Date   HGBA1C 5.8 07/04/2023

## 2023-08-22 NOTE — Assessment & Plan Note (Signed)
Increased belching and gas. Monitor for food triggers. Pepcid did not help. Benefiber for bowels.  If persistent - refer back to GI. Follow.

## 2023-08-22 NOTE — Assessment & Plan Note (Signed)
Right breast tenderness - 12-3:00. Obtain right breast mammogram with possible ultrasound.

## 2023-08-22 NOTE — Progress Notes (Signed)
Subjective:    Patient ID: Helen Marshall, female    DOB: Mar 02, 1946, 78 y.o.   MRN: [id number]  Patient here for  Chief Complaint  Patient presents with   Medical Management of Chronic Issues    Follow up on Zoloft    HPI Here for a scheduled follow up - f/u regarding hypercholesterolemia, hypertension and hyperglycemia. Was evaluated by Dr Tat for tremor. Diagnosed with tremor predominant idiopathic parkinson's disease. Started on sinemet. [facility] - for [facility]. She has been participating in boxing - here locally. Was seeing Dr Lemar Livings - f/u breast cancer. He retired. Recommended continued annual screening through PCP. Increased stress. Trying to cope with her daughter's passing. Discussed further treatment/counseling. Started her on zoloft last visit.  She is doing better. Feeling better. Takes zoloft prn. Works well for her. Incidental finding CT abdomen 04/26/21.  Had f/u chest CT 10/02/21 - stable 4mm right lower lobe pulmonary nodule.  Discussed f/u CT.  Agreeable. Breathing stable. No chest pain reported. Does report some increased gas and alternating loose stool and constipation. Discussed benefiber. Previous tender right breast - rodered mammogram.  Has not been performed.  Still some tenderness. Also reports some intermittent right ear fullness.  Some drainage/runny nose. No chest congestion or cough.    Past Medical History:  Diagnosis Date   Abnormal LFTs    Abnormal mammogram 12/19/2012   left   Allergy    Anemia    Arthritis    Cancer [facility]) 2014   left Breast   Carotid bruit    left   Cataract    Colon polyps    Diverticulosis 2012   Environmental allergies    Family history of adverse reaction to anesthesia    her aunt was hard to wake up   GERD (gastroesophageal reflux disease)    Heart murmur    History of diverticulitis 05/2021   Hypercholesterolemia    Hyperglycemia    Hypertension    IBS (irritable bowel  syndrome)    Lipoma of colon    Lumbar disc disease    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   Pre-diabetes    Tubular adenoma of colon    Umbilical hernia 05/2021   Past Surgical History:  Procedure Laterality Date   ABDOMINAL HYSTERECTOMY  1989   fibroids   ANKLE SURGERY Left 1999   ANTERIOR AND POSTERIOR REPAIR WITH SACROSPINOUS FIXATION N/A 07/20/2021   Procedure: ANTERIOR AND REPAIR WITH SACROSPINOUS FIXATION;  Surgeon: Marguerita Beards, MD;  Location[facility]  Service: Gynecology;  Laterality: N/A;   BREAST SURGERY Left 1970   biopsy   CATARACT EXTRACTION, BILATERAL  June & July 2017   CHOLECYSTECTOMY  2001   COLONOSCOPY  06/02/2011   Lutricia Feil, MD; diverticulosis, submucosal lipoma of the sigmoid colon.   CYSTOSCOPY N/A 07/20/2021   Procedure: CYSTOSCOPY;  Surgeon: Marguerita Beards, MD;  Location: [facility];  Service: Gynecology;  Laterality: N/A;   eyelid lift Bilateral    HERNIA REPAIR  2001, 2004   HERNIA REPAIR  01/22/2013   Repeat repair of umbilical defect, 2.5 cm. Primary repair.   INSERTION OF MESH N/A 03/31/2022   Procedure: INSERTION OF MESH;  Surgeon: [name], MD;  Location:  [facility] ORS;  Service: General;  Laterality: N/A;   LAPAROSCOPIC HYSTERECTOMY     LUMBAR LAMINECTOMY/DECOMPRESSION MICRODISCECTOMY Bilateral 06/18/2022   Procedure: Bilateral L3-4 Lumbar decompression;  Surgeon: Coletta Memos, MD;  Location: [name] OR;  Service: Neurosurgery;  Laterality: Bilateral;   NASAL SINUS SURGERY  1997   rotator cuff surgery  1998   SQUAMOUS CELL CARCINOMA EXCISION Right 05/2013   shoulder   TUBAL LIGATION  1976   UPPER GI ENDOSCOPY  08/22/2014   Dr Shela Commons. Rhea Belton   VENTRAL HERNIA REPAIR N/A 03/31/2022    Procedure: HERNIA REPAIR VENTRAL ADULT;  Surgeon: [name], MD;  Location: [facility] ORS;  Service: General;  Laterality: N/A;  Sonda Rumble, RNFA to assist TAPP block per anesthesia   Family History  Problem Relation Age of Onset   Heart disease Mother        myocardial infarction   Diabetes Mother    Hearing loss Mother    CVA Father    Alcohol abuse Father    Arthritis/Rheumatoid Sister        x2   Breast cancer Sister 21   Arthritis Sister    Hearing loss Sister    Arthritis Sister    Hearing loss Sister    Asthma Sister    Arthritis Sister    Hearing loss Sister    Hypothyroidism Sister    Arthritis Sister    Hearing loss Sister    Colon polyps Sister    Arthritis Sister    Hearing loss Sister    Hypertension Brother        x2   Arthritis Brother    Hearing loss Brother    Alcohol abuse Brother    Arthritis Brother    Hearing loss Brother    Colon polyps Son    Alcohol abuse Son    Colon cancer Maternal Uncle    Cancer Other        breast - paternal first cousin   Stroke Daughter    Social History   Socioeconomic History   Marital status: Married    Spouse name: ,[name]   Number of children: 2   Years of education: Not on file   Highest education level: Not on file  Occupational History   Occupation: Retired    Comment: Designer, 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  Tobacco Use   Smoking status: Never   Smokeless tobacco: Never  Vaping Use   Vaping status: Never Used  Substance and Sexual Activity   Alcohol use: No    Alcohol/week: 0.0 standard drinks of alcohol   Drug use: No   Sexual activity: Not Currently  Other Topics Concern   Not on file  Social History Narrative   Right handed   Retired    Chief Executive Officer Drivers of Corporate investment banker Strain: Low Risk  (02/15/2023)   Overall Financial Resource Strain (CARDIA)    Difficulty of Paying Living Expenses: Not hard at all  Food Insecurity: No Food Insecurity (02/15/2023)   Hunger Vital Sign    Worried  About Running Out of Food in the Last Year: Never true    Ran Out of Food in the Last Year: Never true  Transportation Needs: No Transportation Needs (02/15/2023)   PRAPARE - Administrator, Civil Service (Medical): No    Lack of Transportation (Non-Medical): No  Physical Activity: Sufficiently Active (02/15/2023)   Exercise Vital Sign    Days of Exercise per Week: 3 days    Minutes of  Exercise per Session: 60 min  Stress: No Stress Concern Present (02/15/2023)   [employer] of Occupational Health - Occupational Stress Questionnaire    Feeling of Stress : Not at all  Social Connections: Socially Integrated (02/15/2023)   Social Connection and Isolation Panel [NHANES]    Frequency of Communication with Friends and Family: More than three times a week    Frequency of Social Gatherings with Friends and Family: Once a week    Attends Religious Services: More than 4 times per year    Active Member of [employer] or Organizations: Yes    Attends Engineer, structural: More than 4 times per year    Marital Status: Married     Review of Systems  Constitutional:  Negative for appetite change, fever and unexpected weight change.  HENT:  Negative for sinus pressure.        Intermittent right ear fullness as outlined.   Respiratory:  Negative for cough, chest tightness and shortness of breath.   Cardiovascular:  Negative for chest pain, palpitations and leg swelling.  Gastrointestinal:  Negative for abdominal pain, nausea and vomiting.       Loose stool and constipation alternating as outlined.   Genitourinary:  Negative for difficulty urinating and dysuria.  Musculoskeletal:  Negative for joint swelling and myalgias.  Skin:  Negative for color change and rash.  Neurological:  Negative for dizziness and headaches.  Psychiatric/Behavioral:  Negative for agitation and dysphoric mood.        Objective:     BP 126/70   Pulse 69   Temp 98 F (36.7 C)   Resp 16   Ht 5'  2.75" (1.594 m)   Wt 164 lb (74.4 kg)   LMP 07/21/1988   SpO2 98%   BMI 29.28 kg/m  Wt Readings from Last 3 Encounters:  08/22/23 164 lb (74.4 kg)  07/05/23 166 lb 9.6 oz (75.6 kg)  07/04/23 165 lb 6.4 oz (75 kg)    Physical Exam Vitals reviewed.  Constitutional:      General: She is not in acute distress.    Appearance: Normal appearance.  HENT:     Head: Normocephalic and atraumatic.     Right Ear: Tympanic membrane, ear canal and external ear normal.     Left Ear: Tympanic membrane, ear canal and external ear normal.     Mouth/Throat:     Pharynx: No oropharyngeal exudate or posterior oropharyngeal erythema.  Eyes:     General: No scleral icterus.       Right eye: No discharge.        Left eye: No discharge.     Conjunctiva/sclera: Conjunctivae normal.  Neck:     Thyroid: No thyromegaly.  Cardiovascular:     Rate and Rhythm: Normal rate and regular rhythm.  Pulmonary:     Effort: No respiratory distress.     Breath sounds: Normal breath sounds. No wheezing.     Comments: Breast exam - tenderness palpation 12-2:00 right breast.  No nipple discharge or nipple retraction present. Could no appreciate distinct nodules or axillary adenopathy. Left breast non tender. No palpable nodules or axillary adenopathy.  Abdominal:     General: Bowel sounds are normal.     Palpations: Abdomen is soft.     Tenderness: There is no abdominal tenderness.  Musculoskeletal:        General: No swelling or tenderness.     Cervical back: Neck supple. No tenderness.  Lymphadenopathy:     Cervical:  No cervical adenopathy.  Skin:    Findings: No erythema or rash.  Neurological:     Mental Status: She is alert.  Psychiatric:        Mood and Affect: Mood normal.        Behavior: Behavior normal.         Outpatient Encounter Medications as of 08/22/2023  Medication Sig   albuterol (VENTOLIN HFA) 108 (90 Base) MCG/ACT inhaler INHALE 2 PUFFS EVERY 6 HOURS AS NEEDED FOR WHEEZING OR  SHORTNESS OF BREATH   amLODipine (NORVASC) 5 MG tablet Take 1 tablet (5 mg total) by mouth daily.   aspirin EC 325 MG tablet Take 325 mg by mouth daily.   benazepril (LOTENSIN) 40 MG tablet Take 1 tablet (40 mg total) by mouth daily.   Calcium Carb-Cholecalciferol (CALCIUM 600 + D PO) Take 1 tablet by mouth daily.   carbidopa-levodopa (SINEMET IR) 25-100 MG tablet Take 1 tablet by mouth 3 (three) times daily. 8am/noon/4pm   ezetimibe (ZETIA) 10 MG tablet Take 1 tablet (10 mg total) by mouth daily.   fish oil-omega-3 fatty acids 1000 MG capsule Take 2 g by mouth 2 (two) times daily.   fluticasone (FLONASE) 50 MCG/ACT nasal spray PLACE 2 SPRAYS INTO BOTH NOSTRILS AS NEEDED.   ibuprofen (ADVIL) 200 MG tablet Take 400 mg by mouth every 6 (six) hours as needed for moderate pain.   lidocaine (LIDODERM) 5 % Place 1 patch onto the skin daily as needed (pain). Remove & Discard patch within 12 hours or as directed by MD   loratadine (CLARITIN) 10 MG tablet Take 10 mg by mouth daily as needed for allergies.   Multiple Vitamin (MULTIVITAMIN) tablet Take 1 tablet by mouth daily.   niacin (VITAMIN B3) 500 MG tablet Take 1,000 mg by mouth 2 (two) times daily.   ondansetron (ZOFRAN-ODT) 4 MG disintegrating tablet Take 1 tablet (4 mg total) by mouth every 8 (eight) hours as needed for nausea or vomiting.   Povidone, PF, (IVIZIA DRY EYES) 0.5 % SOLN Place 1 drop into both eyes 2 (two) times daily.   Probiotic Product (VSL#3 PO) Take 1 capsule by mouth daily.   sertraline (ZOLOFT) 25 MG tablet Take 1 tablet (25 mg total) by mouth daily.   Simethicone (GAS-X PO) Take 2 tablets by mouth daily as needed (gas).   triamcinolone cream (KENALOG) 0.1 % Apply 1 Application topically as needed (psoriasis).   [DISCONTINUED] benazepril (LOTENSIN) 40 MG tablet TAKE 1 TABLET BY MOUTH DAILY   [DISCONTINUED] ezetimibe (ZETIA) 10 MG tablet Take 1 tablet (10 mg total) by mouth daily.   [DISCONTINUED] sertraline (ZOLOFT) 25 MG  tablet Take 1 tablet (25 mg total) by mouth daily.   [DISCONTINUED] tiZANidine (ZANAFLEX) 4 MG tablet Take 1 tablet (4 mg total) by mouth every 6 (six) hours as needed for muscle spasms.   No facility-administered encounter medications on file as of 08/22/2023.     Lab Results  Component Value Date   WBC 5.2 02/25/2023   HGB 13.3 02/25/2023   HCT 40.8 02/25/2023   PLT 216.0 02/25/2023   GLUCOSE 101 (H) 07/04/2023   CHOL 204 (H) 07/04/2023   TRIG 104.0 07/04/2023   HDL 74.60 07/04/2023   LDLDIRECT 101.4 05/07/2013   LDLCALC 109 (H) 07/04/2023   ALT 4 07/04/2023   AST 19 07/04/2023   NA 140 07/04/2023   K 3.6 07/04/2023   CL 103 07/04/2023   CREATININE 0.69 07/04/2023   BUN 10 07/04/2023  CO2 27 07/04/2023   TSH 1.38 07/04/2023   HGBA1C 5.8 07/04/2023   MICROALBUR <0.7 04/19/2018    MR BRAIN WO CONTRAST Result Date: 07/10/2022 CLINICAL DATA:  Tremor on the right side of the body. EXAM: MRI HEAD WITHOUT CONTRAST TECHNIQUE: Multiplanar, multiecho pulse sequences of the brain and surrounding structures were obtained without intravenous contrast. COMPARISON:  None Available. FINDINGS: Brain: No acute infarction, hemorrhage, hydrocephalus, extra-axial collection or mass lesion. Vascular: Normal flow voids. Skull and upper cervical spine: Normal marrow signal. Sinuses/Orbits: Bilateral lens replacement. Polypoid mucosal thickening bilateral maxillary sinuses. Other: None. IMPRESSION: No definite finding to explain right-sided hand tremors. Electronically Signed   By: Lorenza Cambridge M.D.   On: 07/10/2022 13:58       Assessment & Plan:  Primary hypertension Assessment & Plan: Continue amlodipine, benazepril.  Blood pressure has been under good control on this regimen.  Follow pressures.  Follow metabolic panel.    Orders: -     Basic metabolic panel; Future  Hypercholesterolemia Assessment & Plan: The 10-year ASCVD risk score (Arnett DK, et al., 2019) is: 28.5%   Values used to  calculate the score:     Age: 4 years     Sex: Female     Is Non-Hispanic African American: No     Diabetic: No     Tobacco smoker: No     Systolic Blood Pressure: 126 mmHg     Is BP treated: Yes     HDL Cholesterol: 74.6 mg/dL     Total Cholesterol: 204 mg/dL  Intolerant to statin.  On zetia.   Orders: -     Lipid panel; Future -     Hepatic function panel; Future  Hyperglycemia Assessment & Plan: Low carb diet and exercise.  Follow met b and a1c.   Lab Results  Component Value Date   HGBA1C 5.8 07/04/2023    Orders: -     Hemoglobin A1c; Future  Ear fullness, right Assessment & Plan: Intermittent.  No fullness today. Does report some drainage/runny nose. Steroid nasal spray as directed. Ear clear.  Follow.    Lung nodule Assessment & Plan:  Incidental finding CT abdomen 04/26/21.  Had f/u chest CT 10/02/21 - stable 4mm right lower lobe pulmonary nodule.  Discussed f/u chest CT.  Agreeable.  Schedule.   Orders: -     CT CHEST WO CONTRAST; Future  Abnormal findings on diagnostic imaging of lung -     CT CHEST WO CONTRAST; Future  Belching Assessment & Plan: Increased belching and gas. Monitor for food triggers. Pepcid did not help. Benefiber for bowels.  If persistent - refer back to GI. Follow.    Breast tenderness Assessment & Plan: Right breast tenderness - 12-3:00. Obtain right breast mammogram with possible ultrasound.    Environmental allergies Assessment & Plan: Steroid nasal spray as directed.    Grief reaction with prolonged bereavement Assessment & Plan: Doing better. Continue zoloft. She does not feel needs any further intervention. Follow.    Parkinson's disease, unspecified whether dyskinesia present, unspecified whether manifestations fluctuate [facility]) Assessment & Plan: Dr Tat 09/2022 - diagnosed.  Started sinemet. Doing well. Helping.  Tremor better.  Boxing.  Follow.    Other orders -     Benazepril HCl; Take 1 tablet (40 mg total) by  mouth daily.  Dispense: 90 tablet; Refill: 3 -     Ezetimibe; Take 1 tablet (10 mg total) by mouth daily.  Dispense: 90 tablet; Refill: 2 -  Sertraline HCl; Take 1 tablet (25 mg total) by mouth daily.  Dispense: 30 tablet; Refill: 2     Dale Berlin, MD

## 2023-08-24 ENCOUNTER — Telehealth: Payer: Self-pay | Admitting: Internal Medicine

## 2023-08-24 NOTE — Telephone Encounter (Signed)
Lft pt vm to call ofc to sch CT. thanks 

## 2023-09-01 ENCOUNTER — Ambulatory Visit
Admission: RE | Admit: 2023-09-01 | Discharge: 2023-09-01 | Disposition: A | Discharge status: Home / Self Care | Payer: Medicare Other | Source: Ambulatory Visit | Attending: Internal Medicine | Admitting: Internal Medicine

## 2023-09-01 DIAGNOSIS — R918 Other nonspecific abnormal finding of lung field: Secondary | ICD-10-CM | POA: Insufficient documentation

## 2023-09-01 DIAGNOSIS — R911 Solitary pulmonary nodule: Secondary | ICD-10-CM | POA: Insufficient documentation

## 2023-09-08 ENCOUNTER — Ambulatory Visit: Payer: Self-pay | Admitting: Internal Medicine

## 2023-09-08 NOTE — Telephone Encounter (Signed)
 Noted.

## 2023-09-08 NOTE — Telephone Encounter (Signed)
  Chief Complaint: sinus pain / drainage Symptoms: sinus pain yellow drainage pain in face and cheeks. Taking mucinex. Pain level 8/10.  Frequency: 1 week  Pertinent Negatives: Patient denies chest pain no difficulty breathing no fever  Disposition: [] ED /[] Urgent Care (no appt availability in office) / [x] Appointment(In office/virtual)/ []  [facility]/ [] Home Care/ [] Refused Recommended Disposition /[] [facility]/ []  Follow-up with PCP Additional Notes:   Unable to come to appt today . Scheduled appt tomorrow.  Patient reports she will try neti pot today .      Copied from CRM [id number]. Topic: Clinical - Pink Word Triage >> Sep 08, 2023  8:30 AM Joanell B wrote: Reason for Triage: Pt staterd that she has a sinus infection, forehead and cheeks bones hurt, mucus, and drainage. Pt stated that she has been feeling this way for a week and it has got worst today. Reason for Disposition  [1] SEVERE pain AND [2] not improved 2 hours after pain medicine  Answer Assessment - Initial Assessment Questions 1. LOCATION: Where does it hurt?      Face and cheeks  2. ONSET: When did the sinus pain start?  (e.g., hours, days)      1 week ago  3. SEVERITY: How bad is the pain?   (Scale 1-10; mild, moderate or severe)   - MILD (1-3): doesn't interfere with normal activities    - MODERATE (4-7): interferes with normal activities (e.g., work or school) or awakens from sleep   - SEVERE (8-10): excruciating pain and patient unable to do any normal activities        Pain 8/10 4. RECURRENT SYMPTOM: Have you ever had sinus problems before? If Yes, ask: When was the last time? and What happened that time?      Yes not in couple of years  5. NASAL CONGESTION: Is the nose blocked? If Yes, ask: Can you open it or must you breathe through your mouth?     No  6. NASAL DISCHARGE: Do you have discharge from your nose? If so ask, What color?     Yes yellow drainage  7. FEVER:  Do you have a fever? If Yes, ask: What is it, how was it measured, and when did it start?      na 8. OTHER SYMPTOMS: Do you have any other symptoms? (e.g., sore throat, cough, earache, difficulty breathing)     Feels hot this am , sinus pain drainage  yellow  9. PREGNANCY: Is there any chance you are pregnant? When was your last menstrual period?     na  Protocols used: Sinus Pain or Congestion-A-AH

## 2023-09-09 ENCOUNTER — Ambulatory Visit: Payer: Medicare Other | Admitting: Internal Medicine

## 2023-09-09 ENCOUNTER — Encounter: Payer: Self-pay | Admitting: Internal Medicine

## 2023-09-09 VITALS — BP 132/70 | HR 70 | Temp 98.4°F | Resp 16 | Ht 62.72 in | Wt 164.0 lb

## 2023-09-09 DIAGNOSIS — G20A1 Parkinson's disease without dyskinesia, without mention of fluctuations: Secondary | ICD-10-CM | POA: Diagnosis not present

## 2023-09-09 DIAGNOSIS — J329 Chronic sinusitis, unspecified: Secondary | ICD-10-CM

## 2023-09-09 MED ORDER — AMOXICILLIN-POT CLAVULANATE 875-125 MG PO TABS: 1.0000 | ORAL_TABLET | Freq: Two times a day (BID) | ORAL | 0 refills | Status: DC

## 2023-09-09 NOTE — Progress Notes (Signed)
 Subjective:    Patient ID: Helen Marshall, female    DOB: 02/08/1946, 78 y.o.   MRN: [id number]  Patient here for  Chief Complaint  Patient presents with   SINUS PRESSURE   Nasal Congestion    HPI Here for a work in appt - work in with concerns regarding sinus pain and drainage. Reported yesterday am - increased sinus pressure. Nasal congestion. No sore throat. No earache. No sob. No vomiting or diarrhea. Increased drainage. Temp 98.7. son diagnosed with pneumonia. Has taken claritin  and mucinex.    Past Medical History:  Diagnosis Date   Abnormal LFTs    Abnormal mammogram 12/19/2012   left   Allergy    Anemia    Arthritis    Cancer [facility]) 2014   left Breast   Carotid bruit    left   Cataract    Colon polyps    Diverticulosis 2012   Environmental allergies    Family history of adverse reaction to anesthesia    her aunt was hard to wake up   GERD (gastroesophageal reflux disease)    Heart murmur    History of diverticulitis 05/2021   Hypercholesterolemia    Hyperglycemia    Hypertension    IBS (irritable bowel syndrome)    Lipoma of colon    Lumbar disc disease    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   Pre-diabetes    Tubular adenoma of colon    Umbilical hernia 05/2021   Past Surgical History:  Procedure Laterality Date   ABDOMINAL HYSTERECTOMY  1989   fibroids   ANKLE SURGERY Left 1999   ANTERIOR AND POSTERIOR REPAIR WITH SACROSPINOUS FIXATION N/A 07/20/2021   Procedure: ANTERIOR AND REPAIR WITH SACROSPINOUS FIXATION;  Surgeon: Marilynne Rosaline SAILOR, MD;  Location: [facility];  Service: Gynecology;  Laterality: N/A;   BREAST SURGERY Left 1970   biopsy   CATARACT EXTRACTION, BILATERAL  June & July 2017   CHOLECYSTECTOMY  2001    COLONOSCOPY  06/02/2011   Deward Piedmont, MD; diverticulosis, submucosal lipoma of the sigmoid colon.   CYSTOSCOPY N/A 07/20/2021   Procedure: CYSTOSCOPY;  Surgeon: Marilynne Rosaline SAILOR, MD;  Location: [facility];  Service: Gynecology;  Laterality: N/A;   eyelid lift Bilateral    HERNIA REPAIR  2001, 2004   HERNIA REPAIR  01/22/2013   Repeat repair of umbilical defect, 2.5 cm. Primary repair.   INSERTION OF MESH N/A 03/31/2022   Procedure: INSERTION OF MESH;  Surgeon: Dessa Reyes LELON, MD;  Location: [facility] ORS;  Service: General;  Laterality: N/A;   LAPAROSCOPIC HYSTERECTOMY     LUMBAR LAMINECTOMY/DECOMPRESSION MICRODISCECTOMY Bilateral 06/18/2022   Procedure: Bilateral L3-4 Lumbar decompression;  Surgeon: Gillie Duncans, MD;  Location[facility] OR;  Service: Neurosurgery;  Laterality: Bilateral;   NASAL SINUS SURGERY  1997   rotator cuff surgery  1998   SQUAMOUS CELL CARCINOMA EXCISION Right 05/2013   shoulder   TUBAL LIGATION  1976   UPPER GI ENDOSCOPY  08/22/2014   Dr JINNY. Albert[place]   VENTRAL HERNIA REPAIR N/A 03/31/2022   Procedure: HERNIA REPAIR VENTRAL ADULT;  Surgeon: Dessa Reyes LELON, MD;  Location: [facility] ORS;  Service: General;  Laterality: N/A;  Shelba Rakers, RNFA to assist TAPP block per anesthesia   Family History  Problem Relation Age of Onset   Heart disease Mother        myocardial infarction   Diabetes Mother    Hearing loss Mother    CVA Father    Alcohol  abuse Father    Arthritis/Rheumatoid Sister        x2   Breast cancer Sister 69   Arthritis Sister    Hearing loss Sister    Arthritis Sister    Hearing loss Sister    Asthma Sister    Arthritis Sister    Hearing loss Sister    Hypothyroidism Sister    Arthritis Sister    Hearing loss Sister    Colon polyps Sister    Arthritis Sister    Hearing loss Sister    Hypertension Brother        x2   Arthritis Brother    Hearing loss Brother    Alcohol  abuse Brother    Arthritis Brother    Hearing loss  Brother    Colon polyps Son    Alcohol  abuse Son    Colon cancer Maternal Uncle    Cancer Other        breast - paternal first cousin   Stroke Daughter    Social History   Socioeconomic History   Marital status: Married    Spouse name: ,Vicenta   Number of children: 2   Years of education: Not on file   Highest education level: Not on file  Occupational History   Occupation: Retired    Comment: designer, 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  Tobacco Use   Smoking status: Never   Smokeless tobacco: Never  Vaping Use   Vaping status: Never Used  Substance and Sexual Activity   Alcohol  use: No    Alcohol /week: 0.0 standard drinks of alcohol    Drug use: No   Sexual activity: Not Currently  Other Topics Concern   Not on file  Social History Narrative   Right handed   Retired    Chief Executive Officer Drivers of Corporate Investment Banker Strain: Low Risk  (02/15/2023)   Overall Financial Resource Strain (CARDIA)    Difficulty of Paying Living Expenses: Not hard at all  Food Insecurity: No Food Insecurity (02/15/2023)   Hunger Vital Sign    Worried About Running Out of Food in the Last Year: Never true    Ran Out of Food in the Last Year: Never true  Transportation Needs: No Transportation Needs (02/15/2023)   PRAPARE - Administrator, Civil Service (Medical): No    Lack of Transportation (Non-Medical): No  Physical Activity: Sufficiently Active (02/15/2023)   Exercise Vital Sign    Days of Exercise per Week: 3 days    Minutes of Exercise per Session: 60 min  Stress: No Stress Concern Present (02/15/2023)   [employer] of Occupational Health - Occupational Stress Questionnaire    Feeling of Stress : Not at all  Social Connections: Socially Integrated (02/15/2023)   Social Connection and Isolation Panel [NHANES]    Frequency of Communication with Friends and Family: More than three times a week    Frequency of Social Gatherings with Friends and Family: Once a week    Attends Religious Services:  More than 4 times per year    Active Member of [employer] or Organizations: Yes    Attends Engineer, Structural: More than 4 times per year    Marital Status: Married     Review of Systems  Constitutional:  Negative for  appetite change and unexpected weight change.  HENT:  Positive for congestion, postnasal drip and sinus pressure.   Respiratory:  Positive for cough. Negative for chest tightness and shortness of breath.   Cardiovascular:  Negative for chest pain and palpitations.  Gastrointestinal:  Negative for abdominal pain, diarrhea, nausea and vomiting.  Genitourinary:  Negative for difficulty urinating and dysuria.  Musculoskeletal:  Negative for joint swelling and myalgias.  Skin:  Negative for color change and rash.  Neurological:  Negative for dizziness and headaches.  Psychiatric/Behavioral:  Negative for agitation and dysphoric mood.        Objective:     BP 132/70   Pulse 70   Temp 98.4 F (36.9 C)   Resp 16   Ht 5' 2.72 (1.593 m)   Wt 164 lb (74.4 kg)   LMP 07/21/1988   SpO2 98%   BMI 29.31 kg/m  Wt Readings from Last 3 Encounters:  09/09/23 164 lb (74.4 kg)  08/22/23 164 lb (74.4 kg)  07/05/23 166 lb 9.6 oz (75.6 kg)    Physical Exam Vitals reviewed.  Constitutional:      General: She is not in acute distress.    Appearance: Normal appearance.  HENT:     Head: Normocephalic and atraumatic.     Right Ear: External ear normal.     Left Ear: External ear normal.  Eyes:     General: No scleral icterus.       Right eye: No discharge.        Left eye: No discharge.     Conjunctiva/sclera: Conjunctivae normal.  Neck:     Thyroid : No thyromegaly.  Cardiovascular:     Rate and Rhythm: Normal rate and regular rhythm.  Pulmonary:     Effort: No respiratory distress.     Breath sounds: Normal breath sounds. No wheezing.  Abdominal:     General: Bowel sounds are normal.     Palpations: Abdomen is soft.     Tenderness: There is no abdominal  tenderness.  Musculoskeletal:        General: No swelling or tenderness.     Cervical back: Neck supple. No tenderness.  Lymphadenopathy:     Cervical: No cervical adenopathy.  Skin:    Findings: No erythema or rash.  Neurological:     Mental Status: She is alert.  Psychiatric:        Mood and Affect: Mood normal.        Behavior: Behavior normal.         Outpatient Encounter Medications as of 09/09/2023  Medication Sig   albuterol  (VENTOLIN  HFA) 108 (90 Base) MCG/ACT inhaler INHALE 2 PUFFS EVERY 6 HOURS AS NEEDED FOR WHEEZING OR SHORTNESS OF BREATH   amLODipine  (NORVASC ) 5 MG tablet Take 1 tablet (5 mg total) by mouth daily.   amoxicillin -clavulanate (AUGMENTIN ) 875-125 MG tablet Take 1 tablet by mouth 2 (two) times daily.   aspirin EC 325 MG tablet Take 325 mg by mouth daily.   benazepril  (LOTENSIN ) 40 MG tablet Take 1 tablet (40 mg total) by mouth daily.   Calcium  Carb-Cholecalciferol  (CALCIUM  600 + D PO) Take 1 tablet by mouth daily.   carbidopa -levodopa  (SINEMET  IR) 25-100 MG tablet Take 1 tablet by mouth 3 (three) times daily. 8am/noon/4pm   ezetimibe  (ZETIA ) 10 MG tablet Take 1 tablet (10 mg total) by mouth daily.   fish oil-omega-3 fatty acids 1000 MG capsule Take 2 g by mouth 2 (two) times daily.   fluticasone  (FLONASE ) 50  MCG/ACT nasal spray PLACE 2 SPRAYS INTO BOTH NOSTRILS AS NEEDED.   ibuprofen  (ADVIL ) 200 MG tablet Take 400 mg by mouth every 6 (six) hours as needed for moderate pain.   lidocaine  (LIDODERM ) 5 % Place 1 patch onto the skin daily as needed (pain). Remove & Discard patch within 12 hours or as directed by MD   loratadine  (CLARITIN ) 10 MG tablet Take 10 mg by mouth daily as needed for allergies.   Multiple Vitamin (MULTIVITAMIN) tablet Take 1 tablet by mouth daily.   niacin (VITAMIN B3) 500 MG tablet Take 1,000 mg by mouth 2 (two) times daily.   ondansetron  (ZOFRAN -ODT) 4 MG disintegrating tablet Take 1 tablet (4 mg total) by mouth every 8 (eight) hours as  needed for nausea or vomiting.   Povidone, PF, (IVIZIA DRY EYES) 0.5 % SOLN Place 1 drop into both eyes 2 (two) times daily.   Probiotic Product (VSL#3 PO) Take 1 capsule by mouth daily.   sertraline  (ZOLOFT ) 25 MG tablet Take 1 tablet (25 mg total) by mouth daily.   Simethicone (GAS-X PO) Take 2 tablets by mouth daily as needed (gas).   triamcinolone  cream (KENALOG ) 0.1 % Apply 1 Application topically as needed (psoriasis).   No facility-administered encounter medications on file as of 09/09/2023.     Lab Results  Component Value Date   WBC 5.2 02/25/2023   HGB 13.3 02/25/2023   HCT 40.8 02/25/2023   PLT 216.0 02/25/2023   GLUCOSE 101 (H) 07/04/2023   CHOL 204 (H) 07/04/2023   TRIG 104.0 07/04/2023   HDL 74.60 07/04/2023   LDLDIRECT 101.4 05/07/2013   LDLCALC 109 (H) 07/04/2023   ALT 4 07/04/2023   AST 19 07/04/2023   NA 140 07/04/2023   K 3.6 07/04/2023   CL 103 07/04/2023   CREATININE 0.69 07/04/2023   BUN 10 07/04/2023   CO2 27 07/04/2023   TSH 1.38 07/04/2023   HGBA1C 5.8 07/04/2023   MICROALBUR <0.7 04/19/2018       Assessment & Plan:  Sinusitis, unspecified chronicity, unspecified location Assessment & Plan: Increased sinus pressure and nasal congestion. Discussed. Treat with augmentin  as directed. Continue probiotic daily. Saline nasal spray and steroid nasal spray as directed. Mucinex. Follow.  Call with update.    Parkinson's disease, unspecified whether dyskinesia present, unspecified whether manifestations fluctuate [facility]) Assessment & Plan: Dr Tat 09/2022 - diagnosed.  Started sinemet . Doing well. Helping.  Tremor better.  Boxing.  Follow.    Other orders -     Amoxicillin -Pot Clavulanate; Take 1 tablet by mouth 2 (two) times daily.  Dispense: 14 tablet; Refill: 0     Allena Hamilton, MD

## 2023-09-11 ENCOUNTER — Encounter: Payer: Self-pay | Admitting: Internal Medicine

## 2023-09-11 NOTE — Assessment & Plan Note (Signed)
Dr Tat 09/2022 - diagnosed.  Started sinemet. Doing well. Helping.  Tremor better.  Boxing.  Follow.

## 2023-09-11 NOTE — Assessment & Plan Note (Signed)
 Increased sinus pressure and nasal congestion. Discussed. Treat with augmentin  as directed. Continue probiotic daily. Saline nasal spray and steroid nasal spray as directed. Mucinex. Follow.  Call with update.

## 2023-09-13 NOTE — Progress Notes (Unsigned)
Assessment/Plan:   1.  Parkinsons Disease, diagnosed February, 2024  -Continue carbidopa/levodopa 25/100, 1 tablet 3 times per day.  She looks much better on medication.  Refilled today   2.  Back pain  -Status post lumbar laminectomy with Dr. Franky Macho in November, 2023  3.  History of hypertension  -On amlodipine and BenzePrO.  Will need to watch dual therapy with the course of time given propensity of Parkinsons for dysautonomia.  Primary care is monitoring. Subjective:   Helen Marshall was seen today in follow up for Parkinsons disease.  Outside records that were made available to me were reviewed. Pt denies falls.  Pt denies lightheadedness, near syncope.  No hallucinations.  Mood has been good.  She is participating in boxing.  She is following with primary care faithfully and those notes are reviewed.  Current prescribed movement disorder medications: Carbidopa/levodopa 25/100, 1 tablet 3 times per day     ALLERGIES:   Allergies  Allergen Reactions   Bactrim [Sulfamethoxazole-Trimethoprim] Nausea Only   Crestor [Rosuvastatin] Other (See Comments)    Leg cramping    Demerol [Meperidine] Nausea And Vomiting   Latex Other (See Comments)    Redness, hands breakout   Lipitor [Atorvastatin] Other (See Comments)    pain    Lovastatin Other (See Comments)    Elevated CK   Oxycodone Nausea And Vomiting   Pravastatin Other (See Comments)    pain   Tramadol Nausea Only   Vicodin [Hydrocodone-Acetaminophen] Other (See Comments)    Felt weird    Zocor [Simvastatin] Other (See Comments)    pain    Gabapentin Rash    CURRENT MEDICATIONS:  No outpatient medications have been marked as taking for the 09/15/23 encounter (Appointment) with Jenie Parish, Octaviano Batty, DO.     Objective:   PHYSICAL EXAMINATION:    VITALS:   There were no vitals filed for this visit.   GEN:  The patient appears stated age and is in NAD. HEENT:  Normocephalic, atraumatic.  The mucous membranes are  moist. The superficial temporal arteries are without ropiness or tenderness.  Neurological examination:  Orientation: The patient is alert and oriented x3. Cranial nerves: There is good facial symmetry with no significant facial hypomimia. The speech is fluent and clear. Soft palate rises symmetrically and there is no tongue deviation. Hearing is intact to conversational tone. Sensation: Sensation is intact to light touch throughout Motor: Strength is at least antigravity x4.  Movement examination: Tone: There is nl tone in the bilateral upper extremities.  The tone in the lower extremities is nl.  Abnormal movements: no UE/LE tremor (improved).    There is chin tremor Coordination:  There is no decremation with any form of RAMS, including alternating supination and pronation of the forearm, hand opening and closing, finger taps, heel taps and toe taps (improved).  Gait and Station: The patient pushes off to arise.  She doesn't shuffle.  She is just a bit short stepped.  She has virtually no arm swing bilaterally (improved)  I have reviewed and interpreted the following labs independently    Chemistry      Component Value Date/Time   NA 140 07/04/2023 1048   K 3.6 07/04/2023 1048   CL 103 07/04/2023 1048   CO2 27 07/04/2023 1048   BUN 10 07/04/2023 1048   CREATININE 0.69 07/04/2023 1048      Component Value Date/Time   CALCIUM 9.1 07/04/2023 1048   ALKPHOS 80 07/04/2023 1048  AST 19 07/04/2023 1048   ALT 4 07/04/2023 1048   BILITOT 0.5 07/04/2023 1048       Lab Results  Component Value Date   WBC 5.2 02/25/2023   HGB 13.3 02/25/2023   HCT 40.8 02/25/2023   MCV 95.8 02/25/2023   PLT 216.0 02/25/2023    Lab Results  Component Value Date   TSH 1.38 07/04/2023     Total time spent on today's visit was *** minutes, including both face-to-face time and nonface-to-face time.  Time included that spent on review of records (prior notes available to me/labs/imaging if  pertinent), discussing treatment and goals, answering patient's questions and coordinating care.  Cc:  Dale Northlake, MD

## 2023-09-14 ENCOUNTER — Other Ambulatory Visit: Payer: Self-pay | Admitting: Internal Medicine

## 2023-09-14 DIAGNOSIS — R9389 Abnormal findings on diagnostic imaging of other specified body structures: Secondary | ICD-10-CM

## 2023-09-14 NOTE — Progress Notes (Signed)
Order placed for pulmonary referral.

## 2023-09-15 ENCOUNTER — Encounter: Payer: Self-pay | Admitting: Neurology

## 2023-09-15 ENCOUNTER — Ambulatory Visit (INDEPENDENT_AMBULATORY_CARE_PROVIDER_SITE_OTHER): Payer: Medicare Other | Admitting: Neurology

## 2023-09-15 VITALS — BP 132/88 | HR 72 | Wt 166.6 lb

## 2023-09-15 DIAGNOSIS — G20A1 Parkinson's disease without dyskinesia, without mention of fluctuations: Secondary | ICD-10-CM

## 2023-09-15 MED ORDER — CARBIDOPA-LEVODOPA 25-100 MG PO TABS: 1.0000 | ORAL_TABLET | Freq: Three times a day (TID) | ORAL | 1 refills | Status: DC

## 2023-09-15 NOTE — Patient Instructions (Signed)
Local and Online Resources for Power over Parkinson's Group?  Febraury 2025 ?  [place] PARKINSON'S GROUPS??  Power over Parkinson's Group:???  Upcoming Power over [employer] Meetings/Care Partner Support:? 2nd Wednesdays of the month at 2 pm:  February 12th, March 12th Contact Lynwood Dawley at [email or web address] or Amy Marriott at [email or web address] if interested in participating in this group?  ?  LOCAL EVENTS AND NEW OFFERINGS?  Dance Project Spring 2025:  January 14-May 20, Tuesdays 10-11 am.  All details on website: [email or web address].is ACT FITNESS Chair Yoga classes "Train and Gain", Fridays 10 am, ACT [employer].  Contact Gina at [phone].   PWR! Moves [place] class!  Wednesdays at 10 am.  Please contact Lonia Blood, PT at [email or web address] if interested. Health visitor Classes offering at [employer]!? Tuesdays (Chair Yoga)  and Wednesdays (PWR! Moves)  1:00 pm.?? Contact Aldona Lento [phone] or [email or web address] Drumming for Parkinson's will be held on 2nd and 4th Mondays at 11:00 am.?? Located at the [place] of the [place] ([address] [place] ? Contact Albertina Parr at [email or web address] or [phone]?  [employer] Parkinson's Tai Chi Class, Mondays at 11 am.  Call [phone] for details  [facility] at [facility]- [address], [place] Wednesdays- 4:00 - 5:00 PM - specifically for Parkinson's Disease.  Free!  Contact Denny Peon, [place] - [phone] (clinic) or  [phone] (cell) or by email: [email or web address]   ?ONLINE EDUCATION AND SUPPORT?  Parkinson Foundation:? www.parkinson.org?  PD Health at Home continues:? Mindfulness Mondays, Wellness Wednesdays, Fitness Fridays??  Upcoming Education:??  Learn More, Live Better.  Parkinson's Symposium Scientist, water quality and  [place], [place]).   Saturday, February 8th, 9:30-2:00 Staying Connected:  Nurturing Wellness beyond [employer]. Wednesday, February 12th, 1-2 pm Impulse Control Disorders:  Understanding and Managing the Challenges.  Wednesday, February 19th, 1-2 pm Veterans and Parkinson's:  [employer].  Thursday, February 27th, 2:30-4 pm Care Partner Conversations.  Wednesday, March 5th, 1-2 pm Expert Briefing:    Nourishing Wellness-Nutrition for [employer].  Wednesday, March 12th, 1-2 pm. Register for virtual education and Photographer (webinars) at [employer].gl  Please check out their website to sign up for emails and see their full online offerings??  ?  [employer] Foundation:? www.michaeljfox.org??  Third Thursday Webinars:? On the third Thursday of every month at 12 p.m. ET, join our free live webinars to learn about various aspects of living with Parkinson's disease and our work to speed medical breakthroughs.?  Upcoming Webinar:? How Government Policies Impact Parkinson's Research:  Wins and Next Steps.  Thursday, February 20th at 12 noon.  Check out additional information on their website to see their full online offerings?  ?  [employer]:? www.davisphinneyfoundation.org?  Upcoming Webinar:   Overcoming Hurdles to Exercise.  Tuesday, February 18th at 5 pm Series:? Living with Parkinson's Meetup.?? Third Thursdays each month, 3 pm?  Care Partner Monthly Meetup.? With Jillene Bucks [place].? First Tuesday of each month, 2 pm?  Check out additional information to Live Well Today on their website?  ?  Parkinson and Movement Disorders (PMD) Alliance:? www.pmdalliance.org?  NeuroLife Online:? Online Education Events?  Sign up for emails, which are sent weekly to give you updates on programming and online offerings?  ?  Parkinson's Association of [place]:? www.parkinsonassociation.org?  Information on online support groups,  education events, and online exercises including Yoga, Parkinson's exercises and more-LOTS of information on links to PD resources and online events?  Virtual Support Group through  Parkinson's Association of the [place]-First Wednesday of each month at 2 pm   MOVEMENT AND EXERCISE OPPORTUNITIES?  PWR! Moves [place] class continues!  Wednesdays at 10 am.  Please contact Lonia Blood, PT at [email or web address] if interested. Parkinson's Exercise Class offerings at [employer]. Tuesdays (Chair yoga) and Wednesdays (PWR! Moves)  1:00 pm.?  Contact Aldona Lento [phone] or [email or web address]  Parkinson's Wellness Recovery (PWR! Moves)? www.pwr4life.org?  Info on the PWR! Virtual Experience:? You will have access to our expertise?through self-assessment, guided plans that start with the PD-specific fundamentals, educational content, tips, Q&A with an expert, and a growing Engineering geologist of PD-specific pre-recorded and live exercise classes of varying types and intensity - both physical and cognitive! If that is not enough, we offer 1:1 wellness consultations (in-person or virtual) to personalize your PWR! Dance movement psychotherapist.??  Parkinson [employer] Fridays:??  As part of the PD Health @ Home program, this free video series focuses each week on one aspect of fitness designed to support people living with Parkinson's.? These weekly videos highlight the Parkinson Foundation fitness guidelines for people with Parkinson's disease.?  MenusLocal.com.br?  Dance for PD website is offering free, live-stream classes throughout the week, as well as links to [employer] of classes:? https://danceforparkinsons.org/?  Virtual dance and Pilates for Parkinson's classes: Click on the Community Tab> Parkinson's Movement Initiative Tab.? To register for classes and for more information, visit www.NoteBack.co.za and click the "community"  tab.??  [employer] Parkinson's Cycling Classes??  [employer]:? Thursdays @ Noon-Live classes at [employer] ([employer] at [email or web address]?or [phone])?  [facility] [employer] [facility]: Classes Tuesday, Wednesday and Thursday (contact [place] at [email or web address] ?or [phone])?  [employer]?  Varied levels of classes are offered Tuesdays and Thursdays at [facility].??  Stretching with Byrd Hesselbach weekly class is also offered for people with Parkinson's?  To observe a class or for more information, call [phone] or email Patricia Nettle at info@[email or web address]?    ADDITIONAL SUPPORT AND RESOURCES?  Well[employer]Spring [employer]:  Chiropractor:? www.well-springsolutions.org/caregiver-education/caregiver-support-group.? You may also contact Loleta Chance at [place] -spring.org or [phone].????  Well-Spring Navigator:? Just1Navigator program, a?free service to help individuals and families through the journey of determining care for older adults.? The "Navigator" is a Child psychotherapist, Sidney Ace, who will speak with a prospective client and/or loved ones to provide an assessment of the situation and a set of recommendations for a personalized care plan -- all free of charge, and whether?[employer] offers the needed service or not. If the need is not a service we provide, we are well-connected with reputable programs in town that we can refer you to.? www.well-springsolutions.org or to speak with the Navigator, call [phone].?

## 2023-09-16 ENCOUNTER — Telehealth: Payer: Self-pay

## 2023-09-16 NOTE — Telephone Encounter (Signed)
It looks like she has a dianostic mammo and u/s ordered. Can we follow up on this?

## 2023-09-16 NOTE — Telephone Encounter (Signed)
Noted,

## 2023-09-16 NOTE — Telephone Encounter (Signed)
Copied from CRM [id number]. Topic: Clinical - Request for Lab/Test Order >> Sep 16, 2023 10:11 AM Truddie Crumble wrote: Reason for CRM: patient called stating she has not heard anything about an appointment for a mammogram

## 2023-10-06 ENCOUNTER — Encounter: Payer: Self-pay | Admitting: Physical Therapy

## 2023-10-06 ENCOUNTER — Ambulatory Visit: Attending: Neurology | Admitting: Physical Therapy

## 2023-10-06 ENCOUNTER — Other Ambulatory Visit: Payer: Self-pay

## 2023-10-06 DIAGNOSIS — R269 Unspecified abnormalities of gait and mobility: Secondary | ICD-10-CM | POA: Diagnosis present

## 2023-10-06 DIAGNOSIS — R2681 Unsteadiness on feet: Secondary | ICD-10-CM | POA: Insufficient documentation

## 2023-10-06 DIAGNOSIS — R531 Weakness: Secondary | ICD-10-CM | POA: Insufficient documentation

## 2023-10-06 DIAGNOSIS — G20A1 Parkinson's disease without dyskinesia, without mention of fluctuations: Secondary | ICD-10-CM | POA: Insufficient documentation

## 2023-10-06 NOTE — Therapy (Signed)
 OUTPATIENT PHYSICAL THERAPY NEURO EVALUATION   Patient Name: ZARIYA MINNER MRN: [id number] DOB:[birth year], 78 y.o., female Today's Date: 10/06/2023   PCP: Dale Cawood, MD REFERRING PROVIDER: Tat, Octaviano Batty, DO   END OF SESSION:   PT End of Session - 10/06/23 1409     Visit Number 1    Number of Visits 24    Date for PT Re-Evaluation 12/29/23    PT Start Time 1407    PT Stop Time 1450    PT Time Calculation (min) 43 min    Equipment Utilized During Treatment Gait belt    Activity Tolerance Patient tolerated treatment well    Behavior During Therapy WFL for tasks assessed/performed             Past Medical History:  Diagnosis Date   Abnormal LFTs    Abnormal mammogram 12/19/2012   left   Allergy    Anemia    Arthritis    Cancer (HCC) 2014   left Breast   Carotid bruit    left   Cataract    Colon polyps    Diverticulosis 2012   Environmental allergies    Family history of adverse reaction to anesthesia    her aunt was hard to wake up   GERD (gastroesophageal reflux disease)    Heart murmur    History of diverticulitis 05/2021   Hypercholesterolemia    Hyperglycemia    Hypertension    IBS (irritable bowel syndrome)    Lipoma of colon    Lumbar disc disease    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   Pre-diabetes    Tubular adenoma of colon    Umbilical hernia 05/2021   Past Surgical History:  Procedure Laterality Date   ABDOMINAL HYSTERECTOMY  1989   fibroids   ANKLE SURGERY Left 1999   ANTERIOR AND POSTERIOR REPAIR WITH SACROSPINOUS FIXATION N/A 07/20/2021   Procedure: ANTERIOR AND REPAIR WITH SACROSPINOUS FIXATION;  Surgeon: Marguerita Beards, MD;  Location: [facility];  Service: Gynecology;  Laterality: N/A;    BREAST SURGERY Left 1970   biopsy   CATARACT EXTRACTION, BILATERAL  June & July 2017   CHOLECYSTECTOMY  2001   COLONOSCOPY  06/02/2011   Lutricia Feil, MD; diverticulosis, submucosal lipoma of the sigmoid colon.   CYSTOSCOPY N/A 07/20/2021   Procedure: CYSTOSCOPY;  Surgeon: Marguerita Beards, MD;  Location: [facility];  Service: Gynecology;  Laterality: N/A;   eyelid lift Bilateral    HERNIA REPAIR  2001, 2004   HERNIA REPAIR  01/22/2013   Repeat repair of umbilical defect, 2.5 cm. Primary repair.   INSERTION OF MESH N/A 03/31/2022   Procedure: INSERTION OF MESH;  Surgeon: [name], MD;  Location: [facility];  Service: General;  Laterality: N/A;   LAPAROSCOPIC HYSTERECTOMY     LUMBAR LAMINECTOMY/DECOMPRESSION MICRODISCECTOMY Bilateral 06/18/2022   Procedure: Bilateral L3-4 Lumbar decompression;  Surgeon: Coletta Memos, MD;  Location: [facility];  Service: Neurosurgery;  Laterality: Bilateral;   NASAL SINUS SURGERY  1997   rotator cuff surgery  1998   SQUAMOUS CELL CARCINOMA EXCISION Right 05/2013   shoulder   TUBAL LIGATION  1976   UPPER GI ENDOSCOPY  08/22/2014   Dr Shela Commons. Rhea Belton   VENTRAL HERNIA REPAIR N/A 03/31/2022   Procedure:  HERNIA REPAIR VENTRAL ADULT;  Surgeon: [name], MD;  Location: [facility];  Service: General;  Laterality: N/A;  Sonda Rumble, RNFA to assist TAPP block per anesthesia   Patient Active Problem List   Diagnosis Date Noted   Ear fullness, right 08/22/2023   Belching 07/09/2023   Breast tenderness 07/09/2023   Neuropathy 07/09/2023   Urinary tract infection 07/04/2023   Grief reaction with prolonged bereavement 07/04/2023   Parkinson's disease (HCC) 09/30/2022   Leukocytosis 09/28/2022   Lumbar stenosis with neurogenic claudication 06/18/2022   Dermatochalasis of both eyelids 06/12/2022   Squamous cell skin cancer 05/30/2022   Tremor 05/30/2022   History of breast cancer 09/29/2021   Pain around toenail 09/19/2021   Knee  pain 09/19/2021   Lung nodule 04/26/2021   Back pain 07/23/2020   Tremor of both hands 03/16/2020   Vaginal prolapse 03/16/2020   Hand pain, left 06/06/2019   Osteopenia 05/27/2019   Estrogen deficiency 04/22/2019   Sinus infection 07/21/2017   Anemia 11/05/2015   Health care maintenance 11/17/2014   Umbilical hernia 01/17/2013   Neoplasm of left breast, 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   GERD (gastroesophageal reflux disease) 07/22/2012   Diverticulosis 07/21/2012   Hypertension 07/21/2012   Hypercholesterolemia 07/21/2012   Abnormal liver function test 07/21/2012   Left carotid bruit 07/21/2012   Hyperglycemia 07/21/2012   Environmental allergies 07/21/2012   Lumbar disc disease 07/21/2012    ONSET DATE: Parkinson's Disease, diagnosed February, 2024  REFERRING DIAG: G20.A1 (ICD-10-CM) - Parkinson's disease without dyskinesia or fluctuating manifestations (HCC)   THERAPY DIAG:  Unsteadiness on feet  Abnormality of gait  Generalized weakness  Rationale for Evaluation and Treatment: Rehabilitation  SUBJECTIVE:                                                                                                                                                                                             SUBJECTIVE STATEMENT:  Reports she is here based on recommendation from MD due to her gait and balance being impaired. Pt reports she goes to the [employer] exercise class. Pt reports she was told by a Chiropractor that her R LE is shorter than her L and so she uses a shoe lift on R. Pt reports her knees feel a little weak when coming to stand and she feels wobbly when walking.  Pt accompanied by: self  PERTINENT HISTORY: Diagnosed with Parkinson's Disease February 2024, Hx of Back Pain s/p lumbar laminectomy November 2023, HTN  PAIN:  Are you having pain? No  PRECAUTIONS: Fall, Latex Allergy  RED  FLAGS: None   WEIGHT BEARING RESTRICTIONS: No  FALLS: Has patient fallen in last 6 months? No  LIVING ENVIRONMENT: Lives with: lives with their spouse [name]) Lives in: House/apartment Stairs: Yes: Internal: flight steps; on left going up and External: 2 steps; no HR but pt will use a cane for support (does have a back entrance without steps) Pt reports she does have a chair lift to go up the flight of steps Has following equipment at home: Single point cane, Quad cane small base, Walker - 4 wheeled, Wheelchair (manual), shower chair, Grab bars, and transport chair  PLOF: Independent  PATIENT GOALS: Walk without feeling clumsy, Come to stand with more ease  OBJECTIVE:  Note: Objective measures were completed at Evaluation unless otherwise noted.  DIAGNOSTIC FINDINGS: N/A  COGNITION: Overall cognitive status: Within functional limits for tasks assessed   SENSATION: WFL  COORDINATION: No significant bradykinesia noted with patient's movements  EDEMA:  None observed  MUSCLE TONE: Not formally assessed  MUSCLE LENGTH: Not formally assessed  DTRs:  Not formally assessed  POSTURE: rounded shoulders and forward head  LOWER EXTREMITY ROM:     Active   WFL throughout  Right Eval Left Eval  Hip flexion    Hip extension    Hip abduction    Hip adduction    Hip internal rotation    Hip external rotation    Knee flexion    Knee extension    Ankle dorsiflexion    Ankle plantarflexion    Ankle inversion    Ankle eversion     (Blank rows = not tested)  LOWER EXTREMITY MMT:    MMT Right Eval Left Eval  Hip flexion 4- 4  Hip extension    Hip abduction    Hip adduction    Hip internal rotation    Hip external rotation    Knee flexion 4 4+  Knee extension 4+ 4+  Ankle dorsiflexion 4 4  Ankle plantarflexion 4 4  Ankle inversion    Ankle eversion    (Blank rows = not tested)  Manual Muscle Test Scale 0/5 = No muscle contraction can be seen or  felt 1/5 = Contraction can be felt, but there is no motion 2-/5 = Part moves through incomplete ROM w/ gravity decreased 2/5 = Part moves through complete ROM w/ gravity decreased 2+/5 = Part moves through incomplete ROM (<50%) against gravity or through complete ROM w/ gravity 3-/5 = Part moves through incomplete ROM (>50%) against gravity 3/5 = Part moves through complete ROM against gravity 3+/5 = Part moves through complete ROM against gravity/slight resistance 4-/5= Holds test position against slight to moderate pressure 4/5 = Part moves through complete ROM against gravity/moderate resistance 4+/5= Holds test position against moderate to strong pressure 5/5 = Part moves through complete ROM against gravity/full resistance  BED MOBILITY:  Sit to supine Modified independence - per pt report Supine to sit Modified independence - per pt report Pt reports minor difficulty rolling over in bed due to clothes getting caught on sheets  TRANSFERS: Assistive device utilized: None  Sit to stand: Modified independence Stand to sit: Modified independence Chair to chair: Modified independence Floor:  not assessed, would benefit from testing in future  RAMP:  Level of Assistance:  Assistive device utilized:    Ramp Comments: Not assessed, may benefit from testing in future  CURB:  Level of Assistance:    Assistive device utilized:    Curb Comments: Not assessed, would benefit  from testing in future  STAIRS: Level of Assistance:    Stair Negotiation Technique: Forwards with Single Rail on Left Number of Stairs:   Height of Stairs: 6 inch  Comments: Not assessed, would benefit from testing in future  GAIT: Gait pattern: step through pattern, decreased arm swing- Right, decreased arm swing- Left, and narrow BOS Distance walked: ~358ft Assistive device utilized: None Level of assistance: SBA and CGA Comments:   FUNCTIONAL TESTS:  5 times sit to stand: 16.49 seconds with arms  across chest 6 minute walk test: need to assess in next 1-2 visits 10 meter walk test: 0.827 m/s no AD Mini-Best Test:   between 11-13/28 (need to assess TUG vs TUG cognitive)  PATIENT SURVEYS:  ABC scale 77.81%                                                                                                                              TREATMENT DATE: 10/06/23  Outcome Measure specifics listed below.  Five times Sit to Stand Test (FTSS) "Stand up and sit down as quickly as possible 5 times, keeping your arms folded across your chest."    TIME: 16.49 seconds with arms across chest  Times > 13.6 seconds is associated with increased disability and morbidity (Guralnik, 2000) Times > 15 seconds is predictive of recurrent falls in healthy individuals aged 13 and older (Buatois, et al., 2008) Normal performance values in community dwelling individuals aged 77 and older (Bohannon, 2006): 60-69 years: 11.4 seconds 70-79 years: 12.6 seconds 80-89 years: 14.8 seconds  MCID: >= 2.3 seconds for Vestibular Disorders (Meretta, 2006)  10 Meter Walk Test: Patient instructed to walk 10 meters (32.8 ft) as quickly and as safely as possible at their normal speed Results: 0.827 m/s (11.97 seconds & 12.19 seconds )  Cut off scores:   Household Ambulator  < 0.4 m/s  Limited Community Ambulator  0.4 - 0.8 m/s  [employer]  > 0.8 m/s  Increased fall risk  < 1.41m/s  Crossing a Street  >1.44m/s  MCID 0.05 m/s (small), 0.13 m/s (moderate), 0.06 m/s (significant)  (ANPTA Core Set of Outcome Measures for Adults with Neurologic Conditions, 2018)    Mini-BESTest: Balance Evaluation Systems Test SCORE (11-13)/28 pending results of TUG vs TUG cognitive (A score <63% equivalent to <18/28 indicates increased fall risk.)  Unable to complete TUG portion of miniBest today due to time constraints. Noted: Greatest difficulty with standing on compliant surfaces. Pt reports she has difficult walking on  beach due to this.   [facility] PT Assessment - 10/06/23 0001       Mini-BESTest   Sit To Stand Normal: Comes to stand without use of hands and stabilizes independently.    Rise to Toes < 3 s.    Stand on one leg (left) Severe: Unable    Stand on one leg (right) Severe: Unable    Stand on one leg - lowest score 0  Compensatory Stepping Correction - Forward Moderate: More than one step is required to recover equilibrium    Compensatory Stepping Correction - Backward Moderate: More than one step is required to recover equilibrium    Compensatory Stepping Correction - Left Lateral Moderate: Several steps to recover equilibrium    Compensatory Stepping Correction - Right Lateral Normal: Recovers independently with 1 step (crossover or lateral OK)    Stepping Corredtion Lateral - lowest score 1    Stance - Feet together, eyes open, firm surface  Normal: 30s    Stance - Feet together, eyes closed, foam surface  Severe: Unable    Incline - Eyes Closed Severe: Unable    Change in Gait Speed Moderate: Unable to change walking speed or signs of imbalance    Walk with head turns - Horizontal Moderate: performs head turns with reduction in gait speed.    Walk with pivot turns Moderate:Turns with feet close SLOW (>4 steps) with good balance.    Step over obstacles Moderate: Steps over box but touches box OR displays cautious behavior by slowing gait.    Timed UP & GO with Dual Task Moderate: Dual Task affects either counting OR walking (>10%) when compared to the TUG without Dual Task.   will need to perform at next visit   Mini-BEST total score 12             Educated pt on recommendation of continuing with sit<>stand and standing heel raise exercises at home with plan to add balance exercises to HEP during next session based on findings today.   PATIENT EDUCATION: Education details: Findings on outcome measure, therapy POC, initial HEP Person educated: Patient Education method:  Explanation Education comprehension: verbalized understanding and needs further education  HOME EXERCISE PROGRAM:  Verbal HEP of performing: sit stands and heel raises at counter   GOALS: Goals reviewed with patient? Yes  SHORT TERM GOALS: Target date: 11/17/2023  Patient will be independent in home exercise program to improve strength/mobility for better functional independence with ADLs. Baseline: initiated, but needs to be expanded upon Goal status: INITIAL   LONG TERM GOALS: Target date: 12/29/2023  1.  Patient (> 59 years old) will complete five times sit to stand test in < 15 seconds indicating an increased LE strength and improved balance. Baseline: 16.49 seconds with arms across chest Goal status: INITIAL  2.  Patient will increase 10 meter walk test to >1.33m/s as to improve gait speed for better community ambulation and to reduce fall risk. Baseline:  0.827 m/s without AD Goal status: INITIAL  3. Patient will increase six minute walk test distance to >1000 for progression to community ambulator and improve gait ability Baseline: need to assess Goal status: INITIAL  4. Patient will increase MiniBest Test score to >18/28 to indicate a reduced risk for falling and demonstrate increased independence with functional mobility and ADLs.  Baseline: 11-13/28  Goal status: INITIAL  5. Patient will increase ABC scale score >80% to demonstrate better functional mobility and better confidence with ADLs.   Baseline: 77.81%  Goal status: INITIAL   ASSESSMENT:  CLINICAL IMPRESSION: Patient is a 78 y.o. female who was seen today for physical therapy evaluation and treatment for impaired balance, B LE weakness, impaired endurance,and gait deficits due to Parkinson's Disease. She demonstrates increased bilateral LE muscle weakness, impaired balance, impaired functional mobiltiy, and increased fall risk as seen by decreased scores on MiniBest Test, 5xSTS, and . She also reports  decreased confidence in her balance  when performing functional mobility tasks as specified on the ABC scale. Ms. Carrell will benefit from further skilled PT to improve these deficits in order to increase QOL and ease/safety with ADLs and promote safe community mobility.  OBJECTIVE IMPAIRMENTS: Abnormal gait, decreased balance, decreased endurance, difficulty walking, and decreased strength.   ACTIVITY LIMITATIONS: carrying, lifting, bending, standing, squatting, sleeping, stairs, bed mobility, reach over head, and locomotion level  PARTICIPATION LIMITATIONS: cleaning, laundry, shopping, and community activity  PERSONAL FACTORS: Age, Fitness, Sex, Time since onset of injury/illness/exacerbation, and 1-2 comorbidities: Parkinson's, HTN, hx of low back pain  are also affecting patient's functional outcome.   REHAB POTENTIAL: Good  CLINICAL DECISION MAKING: Evolving/moderate complexity  EVALUATION COMPLEXITY: Moderate  PLAN:  PT FREQUENCY: 1-2x/week  PT DURATION: 12 weeks  PLANNED INTERVENTIONS: [id number]- PT Re-evaluation, 97110-Therapeutic exercises, 97530- Therapeutic activity, 97112- Neuromuscular re-education, 97535- Self Care, [id number]- Manual therapy, [phone]- Gait training, [phone]- Canalith repositioning, Patient/Family education, Balance training, Stair training, Vestibular training, DME instructions, Cryotherapy, Moist heat, and Biofeedback  PLAN FOR NEXT SESSION:  - TUG vs TUG cognitive (add this score to MiniBest Test results) - - add balance exercises to HEP & provide print-out - balance interventions specifically: compliant surfaces, single limb support, stepping balance recovery training, eyes closed - dynamic gait training with higher intensity   Kaenan Jake, PT, DPT, NCS, CSRS Physical Therapist - [place]  [facility]  6:36 PM 10/06/23

## 2023-10-10 ENCOUNTER — Ambulatory Visit: Admitting: Physical Therapy

## 2023-10-10 ENCOUNTER — Telehealth: Payer: Self-pay | Admitting: Physical Therapy

## 2023-10-10 NOTE — Therapy (Deleted)
 OUTPATIENT PHYSICAL THERAPY NEURO TREATMENT   Patient Name: Helen Marshall MRN: [id number] DOB:1946-06-22, 78 y.o., female Today's Date: 10/10/2023   PCP: Helen Marshall REFERRING PROVIDER: Tat, Octaviano Batty, DO   END OF SESSION: ***    Past Medical History:  Diagnosis Date   Abnormal LFTs    Abnormal mammogram 12/19/2012   left   Allergy    Anemia    Arthritis    Cancer [facility]) 2014   left Breast   Carotid bruit    left   Cataract    Colon polyps    Diverticulosis 2012   Environmental allergies    Family history of adverse reaction to anesthesia    her aunt was hard to wake up   GERD (gastroesophageal reflux disease)    Heart murmur    History of diverticulitis 05/2021   Hypercholesterolemia    Hyperglycemia    Hypertension    IBS (irritable bowel syndrome)    Lipoma of colon    Lumbar disc disease    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   Pre-diabetes    Tubular adenoma of colon    Umbilical hernia 05/2021   Past Surgical History:  Procedure Laterality Date   ABDOMINAL HYSTERECTOMY  1989   fibroids   ANKLE SURGERY Left 1999   ANTERIOR AND POSTERIOR REPAIR WITH SACROSPINOUS FIXATION N/A 07/20/2021   Procedure: ANTERIOR AND REPAIR WITH SACROSPINOUS FIXATION;  Surgeon: Helen Beards, Marshall;  Location: [facility];  Service: Gynecology;  Laterality: N/A;   BREAST SURGERY Left 1970   biopsy   CATARACT EXTRACTION, BILATERAL  June & July 2017   CHOLECYSTECTOMY  2001   COLONOSCOPY  06/02/2011   Helen Feil, Marshall; diverticulosis, submucosal lipoma of the sigmoid colon.   CYSTOSCOPY N/A 07/20/2021   Procedure: CYSTOSCOPY;  Surgeon: Helen Beards, Marshall;  Location: [facility];  Service: Gynecology;  Laterality: N/A;    eyelid lift Bilateral    HERNIA REPAIR  2001, 2004   HERNIA REPAIR  01/22/2013   Repeat repair of umbilical defect, 2.5 cm. Primary repair.   INSERTION OF MESH N/A 03/31/2022   Procedure: INSERTION OF MESH;  Surgeon: Helen Mayotte, Marshall;  Location: [facility];  Service: General;  Laterality: N/A;   LAPAROSCOPIC HYSTERECTOMY     LUMBAR LAMINECTOMY/DECOMPRESSION MICRODISCECTOMY Bilateral 06/18/2022   Procedure: Bilateral L3-4 Lumbar decompression;  Surgeon: Helen Memos, Marshall;  Location: [facility] OR;  Service: Neurosurgery;  Laterality: Bilateral;   NASAL SINUS SURGERY  1997   rotator cuff surgery  1998   SQUAMOUS CELL CARCINOMA EXCISION Right 05/2013   shoulder   TUBAL LIGATION  1976   UPPER GI ENDOSCOPY  08/22/2014   Dr Helen Marshall   VENTRAL HERNIA REPAIR N/A 03/31/2022   Procedure: HERNIA REPAIR VENTRAL ADULT;  Surgeon: Helen Mayotte, Marshall;  Location: [facility];  Service: General;  Laterality: N/A;  Helen Marshall, RNFA to assist TAPP block per anesthesia   Patient Active Problem List   Diagnosis Date Noted   Ear fullness, right 08/22/2023   Belching 07/09/2023   Breast tenderness 07/09/2023   Neuropathy 07/09/2023   Urinary tract infection 07/04/2023   Grief reaction with prolonged bereavement 07/04/2023   Parkinson's disease (HCC) 09/30/2022   Leukocytosis 09/28/2022   Lumbar stenosis with neurogenic  claudication 06/18/2022   Dermatochalasis of both eyelids 06/12/2022   Squamous cell skin cancer 05/30/2022   Tremor 05/30/2022   History of breast cancer 09/29/2021   Pain around toenail 09/19/2021   Knee pain 09/19/2021   Lung nodule 04/26/2021   Back pain 07/23/2020   Tremor of both hands 03/16/2020   Vaginal prolapse 03/16/2020   Hand pain, left 06/06/2019   Osteopenia 05/27/2019   Estrogen deficiency 04/22/2019   Sinus infection 07/21/2017   Anemia 11/05/2015   Health care maintenance 11/17/2014   Umbilical hernia 01/17/2013   Neoplasm of left breast, primary tumor  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) 01/16/2013   GERD (gastroesophageal reflux disease) 07/22/2012   Diverticulosis 07/21/2012   Hypertension 07/21/2012   Hypercholesterolemia 07/21/2012   Abnormal liver function test 07/21/2012   Left carotid bruit 07/21/2012   Hyperglycemia 07/21/2012   Environmental allergies 07/21/2012   Lumbar disc disease 07/21/2012    ONSET DATE: Parkinson's Disease, diagnosed February, 2024  REFERRING DIAG: G20.A1 (ICD-10-CM) - Parkinson's disease without dyskinesia or fluctuating manifestations (HCC)   THERAPY DIAG: *** No diagnosis found.  Rationale for Evaluation and Treatment: Rehabilitation  SUBJECTIVE:                                                                                                                                                                                             SUBJECTIVE STATEMENT:  ***  Reports she is here based on recommendation from Marshall due to her gait and balance being impaired. Pt reports she goes to the [employer] exercise class. Pt reports she was told by a Chiropractor that her R LE is shorter than her L and so she uses a shoe lift on R. Pt reports her knees feel a little weak when coming to stand and she feels wobbly when walking.  Pt accompanied by: self  PERTINENT HISTORY: Diagnosed with Parkinson's Disease February 2024, Hx of Back Pain s/p lumbar laminectomy November 2023, HTN  PAIN:  Are you having pain? No  PRECAUTIONS: Fall, Latex Allergy  RED FLAGS: None   WEIGHT BEARING RESTRICTIONS: No  FALLS: Has patient fallen in last 6 months? No  LIVING ENVIRONMENT: Lives with: lives with their spouse Helen Marshall) Lives in: House/apartment Stairs: Yes: Internal: flight steps; on left going up and External: 2 steps; no HR but pt will use a cane for support (does have a back entrance without steps) Pt reports she does have a chair lift to go up the flight of steps Has  following equipment at home: Single point cane, Quad cane small base, Walker - 4 wheeled, Wheelchair (  manual), shower chair, Grab bars, and transport chair  PLOF: Independent  PATIENT GOALS: Walk without feeling clumsy, Come to stand with more ease  OBJECTIVE:  Note: Objective measures were completed at Evaluation unless otherwise noted.  DIAGNOSTIC FINDINGS: N/A  COGNITION: Overall cognitive status: Within functional limits for tasks assessed   SENSATION: WFL  COORDINATION: No significant bradykinesia noted with patient's movements  EDEMA:  None observed  MUSCLE TONE: Not formally assessed  MUSCLE LENGTH: Not formally assessed  DTRs:  Not formally assessed  POSTURE: rounded shoulders and forward head  LOWER EXTREMITY ROM:     Active   WFL throughout  Right Eval Left Eval  Hip flexion    Hip extension    Hip abduction    Hip adduction    Hip internal rotation    Hip external rotation    Knee flexion    Knee extension    Ankle dorsiflexion    Ankle plantarflexion    Ankle inversion    Ankle eversion     (Blank rows = not tested)  LOWER EXTREMITY MMT:    MMT Right Eval Left Eval  Hip flexion 4- 4  Hip extension    Hip abduction    Hip adduction    Hip internal rotation    Hip external rotation    Knee flexion 4 4+  Knee extension 4+ 4+  Ankle dorsiflexion 4 4  Ankle plantarflexion 4 4  Ankle inversion    Ankle eversion    (Blank rows = not tested)  Manual Muscle Test Scale 0/5 = No muscle contraction can be seen or felt 1/5 = Contraction can be felt, but there is no motion 2-/5 = Part moves through incomplete ROM w/ gravity decreased 2/5 = Part moves through complete ROM w/ gravity decreased 2+/5 = Part moves through incomplete ROM (<50%) against gravity or through complete ROM w/ gravity 3-/5 = Part moves through incomplete ROM (>50%) against gravity 3/5 = Part moves through complete ROM against gravity 3+/5 = Part moves through complete  ROM against gravity/slight resistance 4-/5= Holds test position against slight to moderate pressure 4/5 = Part moves through complete ROM against gravity/moderate resistance 4+/5= Holds test position against moderate to strong pressure 5/5 = Part moves through complete ROM against gravity/full resistance  BED MOBILITY:  Sit to supine Modified independence - per pt report Supine to sit Modified independence - per pt report Pt reports minor difficulty rolling over in bed due to clothes getting caught on sheets  TRANSFERS: Assistive device utilized: None  Sit to stand: Modified independence Stand to sit: Modified independence Chair to chair: Modified independence Floor:  not assessed, would benefit from testing in future  RAMP:  Level of Assistance:  Assistive device utilized:    Ramp Comments: Not assessed, may benefit from testing in future  CURB:  Level of Assistance:    Assistive device utilized:    Curb Comments: Not assessed, would benefit from testing in future  STAIRS: Level of Assistance:    Stair Negotiation Technique: Forwards with Single Rail on Left Number of Stairs:   Height of Stairs: 6 inch  Comments: Not assessed, would benefit from testing in future  GAIT: Gait pattern: step through pattern, decreased arm swing- Right, decreased arm swing- Left, and narrow BOS Distance walked: ~312ft Assistive device utilized: None Level of assistance: SBA and CGA Comments:   FUNCTIONAL TESTS:  5 times sit to stand: 16.49 seconds with arms across chest 6 minute walk test: need to  assess in next 1-2 visits 10 meter walk test: 0.827 m/s no AD Mini-Best Test:   between 11-13/28 (need to assess TUG vs TUG cognitive)  PATIENT SURVEYS:  ABC scale 77.81%                                                                                                                              TREATMENT DATE: 10/10/23  ***  Outcome Measure specifics listed below.  Five times Sit to  Stand Test (FTSS) "Stand up and sit down as quickly as possible 5 times, keeping your arms folded across your chest."    TIME: 16.49 seconds with arms across chest  Times > 13.6 seconds is associated with increased disability and morbidity (Guralnik, 2000) Times > 15 seconds is predictive of recurrent falls in healthy individuals aged 75 and older (Buatois, et al., 2008) Normal performance values in community dwelling individuals aged 23 and older (Bohannon, 2006): 60-69 years: 11.4 seconds 70-79 years: 12.6 seconds 80-89 years: 14.8 seconds  MCID: >= 2.3 seconds for Vestibular Disorders (Meretta, 2006)  10 Meter Walk Test: Patient instructed to walk 10 meters (32.8 ft) as quickly and as safely as possible at their normal speed Results: 0.827 m/s (11.97 seconds & 12.19 seconds )  Cut off scores:   Household Ambulator  < 0.4 m/s  Limited Community Ambulator  0.4 - 0.8 m/s  [employer]  > 0.8 m/s  Increased fall risk  < 1.20m/s  Crossing a Street  >1.62m/s  MCID 0.05 m/s (small), 0.13 m/s (moderate), 0.06 m/s (significant)  (ANPTA Core Set of Outcome Measures for Adults with Neurologic Conditions, 2018)    Mini-BESTest: Balance Evaluation Systems Test SCORE (11-13)/28 pending results of TUG vs TUG cognitive (A score <63% equivalent to <18/28 indicates increased fall risk.)  Unable to complete TUG portion of miniBest today due to time constraints. Noted: Greatest difficulty with standing on compliant surfaces. Pt reports she has difficult walking on beach due to this.     Educated pt on recommendation of continuing with sit<>stand and standing heel raise exercises at home with plan to add balance exercises to HEP during next session based on findings today.   PATIENT EDUCATION: Education details: Findings on outcome measure, therapy POC, initial HEP Person educated: Patient Education method: Explanation Education comprehension: verbalized understanding and needs  further education  HOME EXERCISE PROGRAM:  Verbal HEP of performing: sit stands and heel raises at counter   GOALS: Goals reviewed with patient? Yes  SHORT TERM GOALS: Target date: 11/17/2023  Patient will be independent in home exercise program to improve strength/mobility for better functional independence with ADLs. Baseline: initiated, but needs to be expanded upon Goal status: INITIAL   LONG TERM GOALS: Target date: 12/29/2023  1.  Patient (> 26 years old) will complete five times sit to stand test in < 15 seconds indicating an increased LE strength and improved balance. Baseline: 16.49 seconds with arms across chest Goal  status: INITIAL  2.  Patient will increase 10 meter walk test to >1.32m/s as to improve gait speed for better community ambulation and to reduce fall risk. Baseline:  0.827 m/s without AD Goal status: INITIAL  3. Patient will increase six minute walk test distance to >1000 for progression to community ambulator and improve gait ability Baseline: need to assess Goal status: INITIAL  4. Patient will increase MiniBest Test score to >18/28 to indicate a reduced risk for falling and demonstrate increased independence with functional mobility and ADLs.  Baseline: 11-13/28  Goal status: INITIAL  5. Patient will increase ABC scale score >80% to demonstrate better functional mobility and better confidence with ADLs.   Baseline: 77.81%  Goal status: INITIAL   ASSESSMENT:  CLINICAL IMPRESSION: *** Patient is a 78 y.o. female who was seen today for physical therapy evaluation and treatment for impaired balance, B LE weakness, impaired endurance,and gait deficits due to Parkinson's Disease. She demonstrates increased bilateral LE muscle weakness, impaired balance, impaired functional mobiltiy, and increased fall risk as seen by decreased scores on MiniBest Test, 5xSTS, and . She also reports decreased confidence in her balance when performing functional mobility  tasks as specified on the ABC scale. Ms. Adney will benefit from further skilled PT to improve these deficits in order to increase QOL and ease/safety with ADLs and promote safe community mobility.  OBJECTIVE IMPAIRMENTS: Abnormal gait, decreased balance, decreased endurance, difficulty walking, and decreased strength.   ACTIVITY LIMITATIONS: carrying, lifting, bending, standing, squatting, sleeping, stairs, bed mobility, reach over head, and locomotion level  PARTICIPATION LIMITATIONS: cleaning, laundry, shopping, and community activity  PERSONAL FACTORS: Age, Fitness, Sex, Time since onset of injury/illness/exacerbation, and 1-2 comorbidities: Parkinson's, HTN, hx of low back pain  are also affecting patient's functional outcome.   REHAB POTENTIAL: Good  CLINICAL DECISION MAKING: Evolving/moderate complexity  EVALUATION COMPLEXITY: Moderate  PLAN:  PT FREQUENCY: 1-2x/week  PT DURATION: 12 weeks  PLANNED INTERVENTIONS: [id number]- PT Re-evaluation, 97110-Therapeutic exercises, 97530- Therapeutic activity, 97112- Neuromuscular re-education, 97535- Self Care, [id number]- Manual therapy, [phone]- Gait training, [phone]- Canalith repositioning, Patient/Family education, Balance training, Stair training, Vestibular training, DME instructions, Cryotherapy, Moist heat, and Biofeedback  PLAN FOR NEXT SESSION:   *** - TUG vs TUG cognitive (add this score to MiniBest Test results) - - add balance exercises to HEP & provide print-out - balance interventions specifically: compliant surfaces, single limb support, stepping balance recovery training, eyes closed - dynamic gait training with higher intensity   Calan Doren, PT, DPT, NCS, CSRS Physical Therapist - [place]  [facility]  2:45 PM 10/10/23

## 2023-10-10 NOTE — Telephone Encounter (Signed)
 Pt contacted via telephone due to missed visit. Therapist left voicemail informing pt of missed appointment and informed pt of future PT appointment date and time.   Casimiro Needle, PT, DPT, NCS, CSRS

## 2023-10-11 NOTE — Therapy (Unsigned)
 OUTPATIENT PHYSICAL THERAPY NEURO TREATMENT   Patient Name: Helen Marshall MRN: [id number] DOB:1946/07/09, 78 y.o., female Today's Date: 10/12/2023   PCP: Dale [place], MD REFERRING PROVIDER: Vladimir Faster, DO   END OF SESSION:   PT End of Session - 10/12/23 0851     Visit Number 2    Number of Visits 24    Date for PT Re-Evaluation 12/29/23    PT Start Time 0849    PT Stop Time 0930    PT Time Calculation (min) 41 min    Equipment Utilized During Treatment Gait belt    Activity Tolerance Patient tolerated treatment well    Behavior During Therapy [facility] for tasks assessed/performed              Past Medical History:  Diagnosis Date   Abnormal LFTs    Abnormal mammogram 12/19/2012   left   Allergy    Anemia    Arthritis    Cancer (HCC) 2014   left Breast   Carotid bruit    left   Cataract    Colon polyps    Diverticulosis 2012   Environmental allergies    Family history of adverse reaction to anesthesia    her aunt was hard to wake up   GERD (gastroesophageal reflux disease)    Heart murmur    History of diverticulitis 05/2021   Hypercholesterolemia    Hyperglycemia    Hypertension    IBS (irritable bowel syndrome)    Lipoma of colon    Lumbar disc disease    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   Pre-diabetes    Tubular adenoma of colon    Umbilical hernia 05/2021   Past Surgical History:  Procedure Laterality Date   ABDOMINAL HYSTERECTOMY  1989   fibroids   ANKLE SURGERY Left 1999   ANTERIOR AND POSTERIOR REPAIR WITH SACROSPINOUS FIXATION N/A 07/20/2021   Procedure: ANTERIOR AND REPAIR WITH SACROSPINOUS FIXATION;  Surgeon: Marguerita Beards, MD;  Location: [facility];  Service: Gynecology;  Laterality: N/A;    BREAST SURGERY Left 1970   biopsy   CATARACT EXTRACTION, BILATERAL  June & July 2017   CHOLECYSTECTOMY  2001   COLONOSCOPY  06/02/2011   Lutricia Feil, MD; diverticulosis, submucosal lipoma of the sigmoid colon.   CYSTOSCOPY N/A 07/20/2021   Procedure: CYSTOSCOPY;  Surgeon: Marguerita Beards, MD;  Location: [facility];  Service: Gynecology;  Laterality: N/A;   eyelid lift Bilateral    HERNIA REPAIR  2001, 2004   HERNIA REPAIR  01/22/2013   Repeat repair of umbilical defect, 2.5 cm. Primary repair.   INSERTION OF MESH N/A 03/31/2022   Procedure: INSERTION OF MESH;  Surgeon: [name], MD;  Location: [facility];  Service: General;  Laterality: N/A;   LAPAROSCOPIC HYSTERECTOMY     LUMBAR LAMINECTOMY/DECOMPRESSION MICRODISCECTOMY Bilateral 06/18/2022   Procedure: Bilateral L3-4 Lumbar decompression;  Surgeon: Coletta Memos, MD;  Location: [facility];  Service: Neurosurgery;  Laterality: Bilateral;   NASAL SINUS SURGERY  1997   rotator cuff surgery  1998   SQUAMOUS CELL CARCINOMA EXCISION Right 05/2013   shoulder   TUBAL LIGATION  1976   UPPER GI ENDOSCOPY  08/22/2014   Dr Shela Commons. Rhea Belton   VENTRAL HERNIA REPAIR N/A 03/31/2022  Procedure: HERNIA REPAIR VENTRAL ADULT;  Surgeon: [name], MD;  Location: [facility];  Service: General;  Laterality: N/A;  Sonda Rumble, RNFA to assist TAPP block per anesthesia   Patient Active Problem List   Diagnosis Date Noted   Ear fullness, right 08/22/2023   Belching 07/09/2023   Breast tenderness 07/09/2023   Neuropathy 07/09/2023   Urinary tract infection 07/04/2023   Grief reaction with prolonged bereavement 07/04/2023   Parkinson's disease (HCC) 09/30/2022   Leukocytosis 09/28/2022   Lumbar stenosis with neurogenic claudication 06/18/2022   Dermatochalasis of both eyelids 06/12/2022   Squamous cell skin cancer 05/30/2022   Tremor 05/30/2022   History of breast cancer 09/29/2021   Pain around toenail 09/19/2021   Knee  pain 09/19/2021   Lung nodule 04/26/2021   Back pain 07/23/2020   Tremor of both hands 03/16/2020   Vaginal prolapse 03/16/2020   Hand pain, left 06/06/2019   Osteopenia 05/27/2019   Estrogen deficiency 04/22/2019   Sinus infection 07/21/2017   Anemia 11/05/2015   Health care maintenance 11/17/2014   Umbilical hernia 01/17/2013   Neoplasm of left breast, 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   GERD (gastroesophageal reflux disease) 07/22/2012   Diverticulosis 07/21/2012   Hypertension 07/21/2012   Hypercholesterolemia 07/21/2012   Abnormal liver function test 07/21/2012   Left carotid bruit 07/21/2012   Hyperglycemia 07/21/2012   Environmental allergies 07/21/2012   Lumbar disc disease 07/21/2012    ONSET DATE: Parkinson's Disease, diagnosed February, 2024  REFERRING DIAG: G20.A1 (ICD-10-CM) - Parkinson's disease without dyskinesia or fluctuating manifestations (HCC)   THERAPY DIAG:  Unsteadiness on feet  Abnormality of gait  Generalized weakness  Rationale for Evaluation and Treatment: Rehabilitation  SUBJECTIVE:                                                                                                                                                                                             SUBJECTIVE STATEMENT:  Pt apologizes for missing last visit, stating she must have misread her schedule. Pt reports her L knee is "catching" and "pulling" a little, reports she may have bothered it during Boxing. Pt reports pain is "not too bad." Denies any stumbles/falls since last visit. Denies medical changes. Pt reports being tired today due to not sleeping well last night.  Pt accompanied by: self  PERTINENT HISTORY: Diagnosed with Parkinson's Disease February 2024, Hx of Back Pain s/p lumbar laminectomy November 2023, HTN  PAIN:  Are you having pain? No  PRECAUTIONS: Fall, Latex Allergy  RED FLAGS: None   WEIGHT BEARING  RESTRICTIONS: No  FALLS:  Has patient fallen in last 6 months? No  LIVING ENVIRONMENT: Lives with: lives with their spouse [name]) Lives in: House/apartment Stairs: Yes: Internal: flight steps; on left going up and External: 2 steps; no HR but pt will use a cane for support (does have a back entrance without steps) Pt reports she does have a chair lift to go up the flight of steps Has following equipment at home: Single point cane, Quad cane small base, Walker - 4 wheeled, Wheelchair (manual), shower chair, Grab bars, and transport chair  PLOF: Independent  PATIENT GOALS: Walk without feeling clumsy, Come to stand with more ease  OBJECTIVE:  Note: Objective measures were completed at Evaluation unless otherwise noted.  DIAGNOSTIC FINDINGS: N/A  COGNITION: Overall cognitive status: Within functional limits for tasks assessed   SENSATION: WFL  COORDINATION: No significant bradykinesia noted with patient's movements  EDEMA:  None observed  MUSCLE TONE: Not formally assessed  MUSCLE LENGTH: Not formally assessed  DTRs:  Not formally assessed  POSTURE: rounded shoulders and forward head  LOWER EXTREMITY ROM:     Active   WFL throughout  Right Eval Left Eval  Hip flexion    Hip extension    Hip abduction    Hip adduction    Hip internal rotation    Hip external rotation    Knee flexion    Knee extension    Ankle dorsiflexion    Ankle plantarflexion    Ankle inversion    Ankle eversion     (Blank rows = not tested)  LOWER EXTREMITY MMT:    MMT Right Eval Left Eval  Hip flexion 4- 4  Hip extension    Hip abduction    Hip adduction    Hip internal rotation    Hip external rotation    Knee flexion 4 4+  Knee extension 4+ 4+  Ankle dorsiflexion 4 4  Ankle plantarflexion 4 4  Ankle inversion    Ankle eversion    (Blank rows = not tested)  Manual Muscle Test Scale 0/5 = No muscle contraction can be seen or felt 1/5 = Contraction can be felt, but  there is no motion 2-/5 = Part moves through incomplete ROM w/ gravity decreased 2/5 = Part moves through complete ROM w/ gravity decreased 2+/5 = Part moves through incomplete ROM (<50%) against gravity or through complete ROM w/ gravity 3-/5 = Part moves through incomplete ROM (>50%) against gravity 3/5 = Part moves through complete ROM against gravity 3+/5 = Part moves through complete ROM against gravity/slight resistance 4-/5= Holds test position against slight to moderate pressure 4/5 = Part moves through complete ROM against gravity/moderate resistance 4+/5= Holds test position against moderate to strong pressure 5/5 = Part moves through complete ROM against gravity/full resistance  BED MOBILITY:  Sit to supine Modified independence - per pt report Supine to sit Modified independence - per pt report Pt reports minor difficulty rolling over in bed due to clothes getting caught on sheets  TRANSFERS: Assistive device utilized: None  Sit to stand: Modified independence Stand to sit: Modified independence Chair to chair: Modified independence Floor:  not assessed, would benefit from testing in future  RAMP:  Level of Assistance:  Assistive device utilized:    Ramp Comments: Not assessed, may benefit from testing in future  CURB:  Level of Assistance:    Assistive device utilized:    Curb Comments: Not assessed, would benefit from testing in future  STAIRS: Level of Assistance:  Stair Negotiation Technique: Forwards with Single Rail on Left Number of Stairs:   Height of Stairs: 6 inch  Comments: Not assessed, would benefit from testing in future  GAIT: Gait pattern: step through pattern, decreased arm swing- Right, decreased arm swing- Left, and narrow BOS Distance walked: ~362ft Assistive device utilized: None Level of assistance: SBA and CGA Comments:   FUNCTIONAL TESTS:  5 times sit to stand: 16.49 seconds with arms across chest 6 minute walk test: need to  assess in next 1-2 visits 10 meter walk test: 0.827 m/s no AD Mini-Best Test:   between 11-13/28 (need to assess TUG vs TUG cognitive)  PATIENT SURVEYS:  ABC scale 77.81%                                                                                                                              TREATMENT DATE: 10/12/23  Unless otherwise stated, CGA/SBA was provided and gait belt donned in order to ensure pt safety throughout session.  6 Min Walk Test:  Instructed patient to ambulate as quickly and as safely as possible for 6 minutes using LRAD. Patient was allowed to take standing rest breaks without stopping the test, but if the patient required a sitting rest break the clock would be stopped and the test would be over.  Results: 791 feet (241 meters, Avg speed 0.12m/s) without AD and supervision for safety. Results indicate that the patient has reduced endurance with ambulation compared to age matched norms.  Age Matched Norms: 5-69 yo M: 70 F: 66, 70-79 yo M: 39 F: 471, 90-89 yo M: 417 F: 392 MDC: 58.21 meters (190.98 feet) or 50 meters (ANPTA Core Set of Outcome Measures for Adults with Neurologic Conditions, 2018)   Quick assessment of pt's L knee pain - localized pain/tenderness at distal end of L biceps femoris muscle/tendon - pt with min swelling in posterior knee compared to R side, but no redness nor bruising noted. Educated on recommendation to ice for pain management.  Participated in Timed Up and Go (TUG): 1st trial: 14.96 seconds 2nd trial: 18.03 seconds with dual-task of counting down by 3s starting at 30 Patient demonstrates high fall risk as indicated by requiring >13.5seconds to complete the TUG.   Performed 1 set of each exercise on her below provided HEP to ensure pt with proper form/technique. Educated pt on safe set-up at home for balance exercises. Provided HEP print-out.   Performed narrow BOS on airex x30seconds eyes open then x30seconds eyes closed with pt  reporting difficulty with this and having min postural sway - close guarding for safety.    PATIENT EDUCATION: Education details: Findings on outcome measure, therapy POC, initial HEP Person educated: Patient Education method: Explanation Education comprehension: verbalized understanding and needs further education  HOME EXERCISE PROGRAM:  Access Code: [email or web address] URL: [email or web address] Date: 10/12/2023 Prepared by: Casimiro Needle  Exercises - Sit to Stand with Arms Crossed  - 1 x daily - 7  x weekly - 2 sets - 10 reps - Heel Raises with Counter Support  - 1 x daily - 7 x weekly - 2 sets - 10 reps - Standing Near Stance in Corner  - 1 x daily - 7 x weekly - 2 sets - 30 seconds hold - Standing Romberg to 3/4 Tandem Stance  - 1 x daily - 7 x weekly - 2 sets - 30 seconds hold   GOALS: Goals reviewed with patient? Yes  SHORT TERM GOALS: Target date: 11/17/2023  Patient will be independent in home exercise program to improve strength/mobility for better functional independence with ADLs. Baseline: initiated, but needs to be expanded upon 10/12/2023: printout provided Goal status: INITIAL   LONG TERM GOALS: Target date: 12/29/2023  1.  Patient (> 40 years old) will complete five times sit to stand test in < 15 seconds indicating an increased LE strength and improved balance. Baseline: 16.49 seconds with arms across chest Goal status: INITIAL  2.  Patient will increase 10 meter walk test to >1.24m/s as to improve gait speed for better community ambulation and to reduce fall risk. Baseline:  0.827 m/s without AD Goal status: INITIAL  3. Patient will increase six minute walk test distance to >1000 for progression to community ambulator and improve gait ability Baseline: 10/12/2023: 791 feet (241 meters) Goal status: INITIAL  4. Patient will increase MiniBest Test score to >18/28 to indicate a reduced risk for falling and demonstrate increased independence with  functional mobility and ADLs.  Baseline: 12/28  Goal status: INITIAL  5. Patient will increase ABC scale score >80% to demonstrate better functional mobility and better confidence with ADLs.   Baseline: 77.81%  Goal status: INITIAL   ASSESSMENT:  CLINICAL IMPRESSION:  Patient is a 78 y.o. female who was seen today for physical therapy evaluation and treatment for impaired balance, B LE weakness, impaired endurance,and gait deficits due to Parkinson's Disease. She demonstrates decreased gait endurance as noted on 6 min walk test with decreased distance achieved compared to her aged matched normal as well as decreased gait speed during test indicating she is a limited community ambulator. Participated in specific balance interventions today targeting narrow BOS with eyes open and eyes closed with pt having greater instability with eyes closed, especially on airex pad indicating decreased integration of her vestibular system. Ms. Crume will benefit from further skilled PT to improve these deficits in order to increase QOL, ease/safety with ADLs, and promote safe community level mobility.   OBJECTIVE IMPAIRMENTS: Abnormal gait, decreased balance, decreased endurance, difficulty walking, and decreased strength.   ACTIVITY LIMITATIONS: carrying, lifting, bending, standing, squatting, sleeping, stairs, bed mobility, reach over head, and locomotion level  PARTICIPATION LIMITATIONS: cleaning, laundry, shopping, and community activity  PERSONAL FACTORS: Age, Fitness, Sex, Time since onset of injury/illness/exacerbation, and 1-2 comorbidities: Parkinson's, HTN, hx of low back pain  are also affecting patient's functional outcome.   REHAB POTENTIAL: Good  CLINICAL DECISION MAKING: Evolving/moderate complexity  EVALUATION COMPLEXITY: Moderate  PLAN:  PT FREQUENCY: 1-2x/week  PT DURATION: 12 weeks  PLANNED INTERVENTIONS: 97164- PT Re-evaluation, 97110-Therapeutic exercises, 97530- Therapeutic  activity, 97112- Neuromuscular re-education, 97535- Self Care, 16109- Manual therapy, [phone]- Gait training, [phone]- Canalith repositioning, Patient/Family education, Balance training, Stair training, Vestibular training, DME instructions, Cryotherapy, Moist heat, and Biofeedback  PLAN FOR NEXT SESSION:  - follow-up on L knee pain - balance interventions specifically: compliant surfaces, single limb support, stepping balance recovery training, eyes closed - dynamic gait training with higher intensity  -  head rotations  - variable direction stepping  - obstacle navigation *Include cognitive dual-task challenges throughout  Casimiro Needle, PT, DPT, NCS, CSRS Physical Therapist - [facility]  [facility]  9:32 AM 10/12/23

## 2023-10-12 ENCOUNTER — Ambulatory Visit: Admitting: Physical Therapy

## 2023-10-12 DIAGNOSIS — R2681 Unsteadiness on feet: Secondary | ICD-10-CM

## 2023-10-12 DIAGNOSIS — R269 Unspecified abnormalities of gait and mobility: Secondary | ICD-10-CM

## 2023-10-12 DIAGNOSIS — R531 Weakness: Secondary | ICD-10-CM

## 2023-10-13 ENCOUNTER — Ambulatory Visit

## 2023-10-17 ENCOUNTER — Ambulatory Visit: Admitting: Physical Therapy

## 2023-10-17 ENCOUNTER — Institutional Professional Consult (permissible substitution): Payer: Medicare Other | Admitting: Student in an Organized Health Care Education/Training Program

## 2023-10-19 NOTE — Therapy (Incomplete)
 OUTPATIENT PHYSICAL THERAPY NEURO TREATMENT   Patient Name: Helen Marshall MRN: [id number] DOB:August 09, 1945, 78 y.o., female Today's Date: 10/20/2023   PCP: Dale Meadowbrook, MD REFERRING PROVIDER: Tat, Octaviano Batty, DO   END OF SESSION:   PT End of Session - 10/20/23 1443     Visit Number 3    Number of Visits 24    Date for PT Re-Evaluation 12/29/23    PT Start Time 1445    PT Stop Time 1529    PT Time Calculation (min) 44 min    Equipment Utilized During Treatment Gait belt    Activity Tolerance Patient tolerated treatment well    Behavior During Therapy WFL for tasks assessed/performed               Past Medical History:  Diagnosis Date   Abnormal LFTs    Abnormal mammogram 12/19/2012   left   Allergy    Anemia    Arthritis    Cancer (HCC) 2014   left Breast   Carotid bruit    left   Cataract    Colon polyps    Diverticulosis 2012   Environmental allergies    Family history of adverse reaction to anesthesia    her aunt was hard to wake up   GERD (gastroesophageal reflux disease)    Heart murmur    History of diverticulitis 05/2021   Hypercholesterolemia    Hyperglycemia    Hypertension    IBS (irritable bowel syndrome)    Lipoma of colon    Lumbar disc disease    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   Pre-diabetes    Tubular adenoma of colon    Umbilical hernia 05/2021   Past Surgical History:  Procedure Laterality Date   ABDOMINAL HYSTERECTOMY  1989   fibroids   ANKLE SURGERY Left 1999   ANTERIOR AND POSTERIOR REPAIR WITH SACROSPINOUS FIXATION N/A 07/20/2021   Procedure: ANTERIOR AND REPAIR WITH SACROSPINOUS FIXATION;  Surgeon: Marguerita Beards, MD;  Location: [facility];  Service: Gynecology;  Laterality: N/A;    BREAST SURGERY Left 1970   biopsy   CATARACT EXTRACTION, BILATERAL  June & July 2017   CHOLECYSTECTOMY  2001   COLONOSCOPY  06/02/2011   Lutricia Feil, MD; diverticulosis, submucosal lipoma of the sigmoid colon.   CYSTOSCOPY N/A 07/20/2021   Procedure: CYSTOSCOPY;  Surgeon: Marguerita Beards, MD;  Location: [facility];  Service: Gynecology;  Laterality: N/A;   eyelid lift Bilateral    HERNIA REPAIR  2001, 2004   HERNIA REPAIR  01/22/2013   Repeat repair of umbilical defect, 2.5 cm. Primary repair.   INSERTION OF MESH N/A 03/31/2022   Procedure: INSERTION OF MESH;  Surgeon: [name], MD;  Location: [facility];  Service: General;  Laterality: N/A;   LAPAROSCOPIC HYSTERECTOMY     LUMBAR LAMINECTOMY/DECOMPRESSION MICRODISCECTOMY Bilateral 06/18/2022   Procedure: Bilateral L3-4 Lumbar decompression;  Surgeon: Coletta Memos, MD;  Location: [facility];  Service: Neurosurgery;  Laterality: Bilateral;   NASAL SINUS SURGERY  1997   rotator cuff surgery  1998   SQUAMOUS CELL CARCINOMA EXCISION Right 05/2013   shoulder   TUBAL LIGATION  1976   UPPER GI ENDOSCOPY  08/22/2014   Dr Shela Commons. Rhea Belton   VENTRAL HERNIA REPAIR N/A 03/31/2022  Procedure: HERNIA REPAIR VENTRAL ADULT;  Surgeon: [name], MD;  Location: [facility];  Service: General;  Laterality: N/A;  Sonda Rumble, RNFA to assist TAPP block per anesthesia   Patient Active Problem List   Diagnosis Date Noted   Ear fullness, right 08/22/2023   Belching 07/09/2023   Breast tenderness 07/09/2023   Neuropathy 07/09/2023   Urinary tract infection 07/04/2023   Grief reaction with prolonged bereavement 07/04/2023   Parkinson's disease (HCC) 09/30/2022   Leukocytosis 09/28/2022   Lumbar stenosis with neurogenic claudication 06/18/2022   Dermatochalasis of both eyelids 06/12/2022   Squamous cell skin cancer 05/30/2022   Tremor 05/30/2022   History of breast cancer 09/29/2021   Pain around toenail 09/19/2021   Knee  pain 09/19/2021   Lung nodule 04/26/2021   Back pain 07/23/2020   Tremor of both hands 03/16/2020   Vaginal prolapse 03/16/2020   Hand pain, left 06/06/2019   Osteopenia 05/27/2019   Estrogen deficiency 04/22/2019   Sinus infection 07/21/2017   Anemia 11/05/2015   Health care maintenance 11/17/2014   Umbilical hernia 01/17/2013   Neoplasm of left breast, 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   GERD (gastroesophageal reflux disease) 07/22/2012   Diverticulosis 07/21/2012   Hypertension 07/21/2012   Hypercholesterolemia 07/21/2012   Abnormal liver function test 07/21/2012   Left carotid bruit 07/21/2012   Hyperglycemia 07/21/2012   Environmental allergies 07/21/2012   Lumbar disc disease 07/21/2012    ONSET DATE: Parkinson's Disease, diagnosed February, 2024  REFERRING DIAG: G20.A1 (ICD-10-CM) - Parkinson's disease without dyskinesia or fluctuating manifestations (HCC)   THERAPY DIAG:  Unsteadiness on feet  Abnormality of gait  Generalized weakness  Rationale for Evaluation and Treatment: Rehabilitation  SUBJECTIVE:                                                                                                                                                                                             SUBJECTIVE STATEMENT:  Patient reports she did not sleep well last night. Legs are hurting.   Pt accompanied by: self  PERTINENT HISTORY: Diagnosed with Parkinson's Disease February 2024, Hx of Back Pain s/p lumbar laminectomy November 2023, HTN  PAIN:  Are you having pain? No  PRECAUTIONS: Fall, Latex Allergy  RED FLAGS: None   WEIGHT BEARING RESTRICTIONS: No  FALLS: Has patient fallen in last 6 months? No  LIVING ENVIRONMENT: Lives with: lives with their spouse Helen Marshall) Lives in: House/apartment Stairs: Yes: Internal: flight steps; on left going up and External: 2 steps; no HR but pt will use a cane for support (does  have a  back entrance without steps) Pt reports she does have a chair lift to go up the flight of steps Has following equipment at home: Single point cane, Quad cane small base, Walker - 4 wheeled, Wheelchair (manual), shower chair, Grab bars, and transport chair  PLOF: Independent  PATIENT GOALS: Walk without feeling clumsy, Come to stand with more ease  OBJECTIVE:  Note: Objective measures were completed at Evaluation unless otherwise noted.  DIAGNOSTIC FINDINGS: N/A  COGNITION: Overall cognitive status: Within functional limits for tasks assessed   SENSATION: WFL  COORDINATION: No significant bradykinesia noted with patient's movements  EDEMA:  None observed  MUSCLE TONE: Not formally assessed  MUSCLE LENGTH: Not formally assessed  DTRs:  Not formally assessed  POSTURE: rounded shoulders and forward head  LOWER EXTREMITY ROM:     Active   WFL throughout  Right Eval Left Eval  Hip flexion    Hip extension    Hip abduction    Hip adduction    Hip internal rotation    Hip external rotation    Knee flexion    Knee extension    Ankle dorsiflexion    Ankle plantarflexion    Ankle inversion    Ankle eversion     (Blank rows = not tested)  LOWER EXTREMITY MMT:    MMT Right Eval Left Eval  Hip flexion 4- 4  Hip extension    Hip abduction    Hip adduction    Hip internal rotation    Hip external rotation    Knee flexion 4 4+  Knee extension 4+ 4+  Ankle dorsiflexion 4 4  Ankle plantarflexion 4 4  Ankle inversion    Ankle eversion    (Blank rows = not tested)  Manual Muscle Test Scale 0/5 = No muscle contraction can be seen or felt 1/5 = Contraction can be felt, but there is no motion 2-/5 = Part moves through incomplete ROM w/ gravity decreased 2/5 = Part moves through complete ROM w/ gravity decreased 2+/5 = Part moves through incomplete ROM (<50%) against gravity or through complete ROM w/ gravity 3-/5 = Part moves through incomplete ROM  (>50%) against gravity 3/5 = Part moves through complete ROM against gravity 3+/5 = Part moves through complete ROM against gravity/slight resistance 4-/5= Holds test position against slight to moderate pressure 4/5 = Part moves through complete ROM against gravity/moderate resistance 4+/5= Holds test position against moderate to strong pressure 5/5 = Part moves through complete ROM against gravity/full resistance  BED MOBILITY:  Sit to supine Modified independence - per pt report Supine to sit Modified independence - per pt report Pt reports minor difficulty rolling over in bed due to clothes getting caught on sheets  TRANSFERS: Assistive device utilized: None  Sit to stand: Modified independence Stand to sit: Modified independence Chair to chair: Modified independence Floor:  not assessed, would benefit from testing in future  RAMP:  Level of Assistance:  Assistive device utilized:    Ramp Comments: Not assessed, may benefit from testing in future  CURB:  Level of Assistance:    Assistive device utilized:    Curb Comments: Not assessed, would benefit from testing in future  STAIRS: Level of Assistance:    Stair Negotiation Technique: Forwards with Single Rail on Left Number of Stairs:   Height of Stairs: 6 inch  Comments: Not assessed, would benefit from testing in future  GAIT: Gait pattern: step through pattern, decreased arm swing- Right, decreased arm swing- Left, and narrow BOS  Distance walked: ~385ft Assistive device utilized: None Level of assistance: SBA and CGA Comments:   FUNCTIONAL TESTS:  5 times sit to stand: 16.49 seconds with arms across chest 6 minute walk test: need to assess in next 1-2 visits 10 meter walk test: 0.827 m/s no AD Mini-Best Test:   between 11-13/28 (need to assess TUG vs TUG cognitive)  PATIENT SURVEYS:  ABC scale 77.81%                                                                                                                               TREATMENT DATE: 10/20/23  Unless otherwise stated, CGA/SBA was provided and gait belt donned in order to ensure pt safety throughout session. TherAct:  Forward reach down to floor then up 10x  Ambulate with dual task naming food in alphabetical order; 1lb AW on hands; 2lb on ankles; painful to L knee on 2nd lap: 150 ft.   ambulate in hallway: -horizontal head turns with cues for reading alphabet from cards 86 ftx 2 sets -horizontal head turns with cues for reading number and symbols from cards 86 ft x 2 sets  -"red light/green light" for sudden initiation/termination of ambulation with close CGA for carryover to natural environment 2x 86 ft   Neuro Re-ed  Standing with CGA next to support surface:  Airex pad: static stand 30 seconds x 2 trials, noticeable trembling of ankles/LE's with fatigue and challenge to maintain stability Airex pad: horizontal head turns 30 seconds scanning room 10x ; cueing for arc of motion  Airex pad: vertical head turns 30 seconds, cueing for arc of motion, noticeable sway with upward gaze increasing demand on ankle righting reaction musculature Airex pad: marching 10x each LE  Airex pad: hedgehogs 10x each LE;   Seated on dynadisc:  -static position 30 seconds -anterior/posterior weight shift 10x -medial/lateral weight shift 10x    PATIENT EDUCATION: Education details: Findings on outcome measure, therapy POC, initial HEP Person educated: Patient Education method: Explanation Education comprehension: verbalized understanding and needs further education  HOME EXERCISE PROGRAM:  Access Code: [email or web address] URL: [email or web address] Date: 10/12/2023 Prepared by: Casimiro Needle  Exercises - Sit to Stand with Arms Crossed  - 1 x daily - 7 x weekly - 2 sets - 10 reps - Heel Raises with Counter Support  - 1 x daily - 7 x weekly - 2 sets - 10 reps - Standing Near Stance in Corner  - 1 x daily - 7 x weekly - 2 sets - 30 seconds  hold - Standing Romberg to 3/4 Tandem Stance  - 1 x daily - 7 x weekly - 2 sets - 30 seconds hold   GOALS: Goals reviewed with patient? Yes  SHORT TERM GOALS: Target date: 11/17/2023  Patient will be independent in home exercise program to improve strength/mobility for better functional independence with ADLs. Baseline: initiated, but needs to be expanded upon 10/12/2023: printout provided Goal status: INITIAL   LONG  TERM GOALS: Target date: 12/29/2023  1.  Patient (> 41 years old) will complete five times sit to stand test in < 15 seconds indicating an increased LE strength and improved balance. Baseline: 16.49 seconds with arms across chest Goal status: INITIAL  2.  Patient will increase 10 meter walk test to >1.24m/s as to improve gait speed for better community ambulation and to reduce fall risk. Baseline:  0.827 m/s without AD Goal status: INITIAL  3. Patient will increase six minute walk test distance to >1000 for progression to community ambulator and improve gait ability Baseline: 10/12/2023: 791 feet (241 meters) Goal status: INITIAL  4. Patient will increase MiniBest Test score to >18/28 to indicate a reduced risk for falling and demonstrate increased independence with functional mobility and ADLs.  Baseline: 12/28  Goal status: INITIAL  5. Patient will increase ABC scale score >80% to demonstrate better functional mobility and better confidence with ADLs.   Baseline: 77.81%  Goal status: INITIAL   ASSESSMENT:  CLINICAL IMPRESSION:  Patient tolerates use of airex pad with occasional episodes of ankle instability. She requires use of hands for single limb positioning such as marching.  Decreased standing tolerated due to L knee pain, patient advised to follow up with physician if pain persists, is agreeable to plan. Patient is challenged with finding midline when talking. Ms. Potts will benefit from further skilled PT to improve her functional deficits in order to  increase QOL, ease/safety with ADLs, and promote safe community level mobility.   OBJECTIVE IMPAIRMENTS: Abnormal gait, decreased balance, decreased endurance, difficulty walking, and decreased strength.   ACTIVITY LIMITATIONS: carrying, lifting, bending, standing, squatting, sleeping, stairs, bed mobility, reach over head, and locomotion level  PARTICIPATION LIMITATIONS: cleaning, laundry, shopping, and community activity  PERSONAL FACTORS: Age, Fitness, Sex, Time since onset of injury/illness/exacerbation, and 1-2 comorbidities: Parkinson's, HTN, hx of low back pain  are also affecting patient's functional outcome.   REHAB POTENTIAL: Good  CLINICAL DECISION MAKING: Evolving/moderate complexity  EVALUATION COMPLEXITY: Moderate  PLAN:  PT FREQUENCY: 1-2x/week  PT DURATION: 12 weeks  PLANNED INTERVENTIONS: 97164- PT Re-evaluation, 97110-Therapeutic exercises, 97530- Therapeutic activity, 97112- Neuromuscular re-education, 97535- Self Care, 56387- Manual therapy, [phone]- Gait training, [phone]- Canalith repositioning, Patient/Family education, Balance training, Stair training, Vestibular training, DME instructions, Cryotherapy, Moist heat, and Biofeedback  PLAN FOR NEXT SESSION:  - follow-up on L knee pain -dynamic gait/ dual tasks - midline orientation   Precious Bard, PT, DPT Physical Therapist - [facility]  Outpatient Physical Therapy- Main Campus [phone]     3:29 PM 10/20/23

## 2023-10-20 ENCOUNTER — Ambulatory Visit

## 2023-10-20 ENCOUNTER — Other Ambulatory Visit: Payer: Self-pay | Admitting: Internal Medicine

## 2023-10-20 DIAGNOSIS — R2681 Unsteadiness on feet: Secondary | ICD-10-CM | POA: Diagnosis not present

## 2023-10-20 DIAGNOSIS — R531 Weakness: Secondary | ICD-10-CM

## 2023-10-20 DIAGNOSIS — R269 Unspecified abnormalities of gait and mobility: Secondary | ICD-10-CM

## 2023-10-21 NOTE — Telephone Encounter (Signed)
 Rx ok'd for albuterol inhlaer.

## 2023-10-24 ENCOUNTER — Encounter: Payer: Self-pay | Admitting: Pulmonary Disease

## 2023-10-24 ENCOUNTER — Ambulatory Visit: Admitting: Pulmonary Disease

## 2023-10-24 ENCOUNTER — Ambulatory Visit: Admitting: Physical Therapy

## 2023-10-24 VITALS — BP 130/80 | HR 80 | Temp 97.1°F | Ht 62.72 in | Wt 166.8 lb

## 2023-10-24 DIAGNOSIS — Z853 Personal history of malignant neoplasm of breast: Secondary | ICD-10-CM | POA: Diagnosis not present

## 2023-10-24 DIAGNOSIS — Z9010 Acquired absence of unspecified breast and nipple: Secondary | ICD-10-CM

## 2023-10-24 DIAGNOSIS — R911 Solitary pulmonary nodule: Secondary | ICD-10-CM

## 2023-10-24 DIAGNOSIS — Z923 Personal history of irradiation: Secondary | ICD-10-CM

## 2023-10-24 NOTE — Progress Notes (Signed)
 Synopsis: Referred in by Dale [place], MD   Subjective:   PATIENT ID: Helen Marshall: female DOB: Jan 23, 1946, MRN: [id number]  Chief Complaint  Patient presents with   Consult    No SOB, wheezing or cough.     HPI Helen Marshall is a pleasant 78 years old female patient with a past medical history right breast cancer in 2016 s/p lumpectomy and radiation therapy in 2016  presenting today to the pulmonary clinic for a lung nodule.   She is asymptomatic from a respiratory standpoint. Denies any weight loss or loss of appetite.   She underwent a CT chest in 2023 by her PCP and showed a 4 mm right lower lobe nodule. This was repeated in 2025 and showed stable right lower lobe nodule. Non spiculated and solid.   FH - No family history of lung cancer   SH - Never smoker   ROS All systems were reviewed and are negative except for the above. Objective:   Vitals:   10/24/23 1057  BP: 130/80  Pulse: 80  Temp: (!) 97.1 F (36.2 C)  SpO2: 97%  Weight: 166 lb 12.8 oz (75.7 kg)  Height: 5' 2.72" (1.593 m)   97% on RA BMI Readings from Last 3 Encounters:  10/24/23 29.81 kg/m  09/15/23 29.78 kg/m  09/09/23 29.31 kg/m   Wt Readings from Last 3 Encounters:  10/24/23 166 lb 12.8 oz (75.7 kg)  09/15/23 166 lb 9.6 oz (75.6 kg)  09/09/23 164 lb (74.4 kg)    Physical Exam GEN: NAD, Healthy Appearing HEENT: Supple Neck, Reactive Pupils, EOMI  CVS: Normal S1, Normal S2, RRR, No murmurs or ES appreciated  Lungs: Clear bilateral air entry.  Abdomen: Soft, non tender, non distended, + BS  Extremities: Warm and well perfused, No edema  Skin: No suspicious lesions appreciated  Psych: Normal Affect  Ancillary Information   CBC    Component Value Date/Time   WBC 5.2 02/25/2023 0829   RBC 4.26 02/25/2023 0829   HGB 13.3 02/25/2023 0829   HCT 40.8 02/25/2023 0829   PLT 216.0 02/25/2023 0829   MCV 95.8 02/25/2023 0829   MCH 31.8 06/19/2022 0719   MCHC 32.5 02/25/2023  0829   RDW 13.6 02/25/2023 0829   LYMPHSABS 1.9 02/25/2023 0829   MONOABS 0.6 02/25/2023 0829   EOSABS 0.3 02/25/2023 0829   BASOSABS 0.1 02/25/2023 0829    Labs and imaging were reviewed.      No data to display           Assessment & Plan:  Helen Marshall is a pleasant 78 years old female patient with a past medical history right breast cancer in 2016 s/p lumpectomy and radiation therapy in 2016  presenting today to the pulmonary clinic for a lung nodule.   #RLL 4mm Nodule  Stable for 2 years. Non spiculated and solid. Risk factors include history of breast cancer. This is likely representing a benign process but given her history of breast Ca, I would like to repeat a CT of the chest in 1 year and if stable than no need for further follow up.   #Lung Scarring - Stable likely related to radiation pneumonitis vs acid reflux. No further evaluation warranted at this time.   Return in about 1 year (around 10/23/2024).  I spent 60 minutes caring for this patient today, including preparing to see the patient, obtaining a medical history , reviewing a separately obtained history, performing a medically appropriate examination and/or  evaluation, counseling and educating the patient/family/caregiver, ordering medications, tests, or procedures, documenting clinical information in the electronic health record, and independently interpreting results (not separately reported/billed) and communicating results to the patient/family/caregiver  Janann Colonel, MD [facility] 10/24/2023 1:01 PM

## 2023-10-26 NOTE — Therapy (Incomplete)
 OUTPATIENT PHYSICAL THERAPY NEURO TREATMENT   Patient Name: Helen Marshall MRN: [id number] DOB:08-04-1945, 78 y.o., female Today's Date: 10/26/2023   PCP: Helen Coleman, MD REFERRING PROVIDER: Tat, Octaviano Batty, DO   END OF SESSION:       Past Medical History:  Diagnosis Date   Abnormal LFTs    Abnormal mammogram 12/19/2012   left   Allergy    Anemia    Arthritis    Cancer [facility]) 2014   left Breast   Carotid bruit    left   Cataract    Colon polyps    Diverticulosis 2012   Environmental allergies    Family history of adverse reaction to anesthesia    her aunt was hard to wake up   GERD (gastroesophageal reflux disease)    Heart murmur    History of diverticulitis 05/2021   Hypercholesterolemia    Hyperglycemia    Hypertension    IBS (irritable bowel syndrome)    Lipoma of colon    Lumbar disc disease    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   Pre-diabetes    Tubular adenoma of colon    Umbilical hernia 05/2021   Past Surgical History:  Procedure Laterality Date   ABDOMINAL HYSTERECTOMY  1989   fibroids   ANKLE SURGERY Left 1999   ANTERIOR AND POSTERIOR REPAIR WITH SACROSPINOUS FIXATION N/A 07/20/2021   Procedure: ANTERIOR AND REPAIR WITH SACROSPINOUS FIXATION;  Surgeon: Helen Beards, MD;  Location: [facility];  Service: Gynecology;  Laterality: N/A;   BREAST SURGERY Left 1970   biopsy   CATARACT EXTRACTION, BILATERAL  June & July 2017   CHOLECYSTECTOMY  2001   COLONOSCOPY  06/02/2011   Helen Feil, MD; diverticulosis, submucosal lipoma of the sigmoid colon.   CYSTOSCOPY N/A 07/20/2021   Procedure: CYSTOSCOPY;  Surgeon: Helen Beards, MD;  Location: [facility];  Service: Gynecology;  Laterality: N/A;    eyelid lift Bilateral    HERNIA REPAIR  2001, 2004   HERNIA REPAIR  01/22/2013   Repeat repair of umbilical defect, 2.5 cm. Primary repair.   INSERTION OF MESH N/A 03/31/2022   Procedure: INSERTION OF MESH;  Surgeon: Helen Mayotte, MD;  Location: [facility];  Service: General;  Laterality: N/A;   LAPAROSCOPIC HYSTERECTOMY     LUMBAR LAMINECTOMY/DECOMPRESSION MICRODISCECTOMY Bilateral 06/18/2022   Procedure: Bilateral L3-4 Lumbar decompression;  Surgeon: Helen Memos, MD;  Location: [facility];  Service: Neurosurgery;  Laterality: Bilateral;   NASAL SINUS SURGERY  1997   rotator cuff surgery  1998   SQUAMOUS CELL CARCINOMA EXCISION Right 05/2013   shoulder   TUBAL LIGATION  1976   UPPER GI ENDOSCOPY  08/22/2014   Helen Marshall   VENTRAL HERNIA REPAIR N/A 03/31/2022   Procedure: HERNIA REPAIR VENTRAL ADULT;  Surgeon: Helen Mayotte, MD;  Location: [facility];  Service: General;  Laterality: N/A;  Helen Marshall, RNFA to assist TAPP block per anesthesia   Patient Active Problem List   Diagnosis Date Noted   Ear fullness, right 08/22/2023   Belching 07/09/2023   Breast tenderness 07/09/2023   Neuropathy 07/09/2023   Urinary tract infection 07/04/2023   Grief reaction with prolonged bereavement 07/04/2023   Parkinson's disease (HCC) 09/30/2022   Leukocytosis 09/28/2022   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/18/2022   Dermatochalasis of both eyelids 06/12/2022   Squamous cell skin cancer 05/30/2022   Tremor 05/30/2022   History of breast cancer 09/29/2021   Pain around toenail 09/19/2021   Knee pain 09/19/2021   Lung nodule 04/26/2021   Back pain 07/23/2020   Tremor of both hands 03/16/2020   Vaginal prolapse 03/16/2020   Hand pain, left 06/06/2019   Osteopenia 05/27/2019   Estrogen deficiency 04/22/2019   Sinus infection 07/21/2017   Anemia 11/05/2015   Health care maintenance 11/17/2014   Umbilical hernia 01/17/2013   Neoplasm of left breast, primary tumor  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) 01/16/2013   GERD (gastroesophageal reflux disease) 07/22/2012   Diverticulosis 07/21/2012   Hypertension 07/21/2012   Hypercholesterolemia 07/21/2012   Abnormal liver function test 07/21/2012   Left carotid bruit 07/21/2012   Hyperglycemia 07/21/2012   Environmental allergies 07/21/2012   Lumbar disc disease 07/21/2012    ONSET DATE: Parkinson's Disease, diagnosed February, 2024  REFERRING DIAG: G20.A1 (ICD-10-CM) - Parkinson's disease without dyskinesia or fluctuating manifestations (HCC)   THERAPY DIAG:  No diagnosis found.  Rationale for Evaluation and Treatment: Rehabilitation  SUBJECTIVE:                                                                                                                                                                                             SUBJECTIVE STATEMENT:  ***  Pt accompanied by: self  PERTINENT HISTORY: Diagnosed with Parkinson's Disease February 2024, Hx of Back Pain s/p lumbar laminectomy November 2023, HTN  PAIN:  Are you having pain? No  PRECAUTIONS: Fall, Latex Allergy  RED FLAGS: None   WEIGHT BEARING RESTRICTIONS: No  FALLS: Has patient fallen in last 6 months? No  LIVING ENVIRONMENT: Lives with: lives with their spouse Helen Marshall) Lives in: House/apartment Stairs: Yes: Internal: flight steps; on left going up and External: 2 steps; no HR but pt will use a cane for support (does have a back entrance without steps) Pt reports she does have a chair lift to go up the flight of steps Has following equipment at home: Single point cane, Quad cane small base, Walker - 4 wheeled, Wheelchair (manual), shower chair, Grab bars, and transport chair  PLOF: Independent  PATIENT GOALS: Walk without feeling clumsy, Come to stand with more ease  OBJECTIVE:  Note: Objective measures were completed at Evaluation unless otherwise noted.  DIAGNOSTIC FINDINGS:  N/A  COGNITION: Overall cognitive status: Within functional limits for tasks assessed   SENSATION: WFL  COORDINATION: No significant bradykinesia noted with patient's movements  EDEMA:  None observed  MUSCLE  TONE: Not formally assessed  MUSCLE LENGTH: Not formally assessed  DTRs:  Not formally assessed  POSTURE: rounded shoulders and forward head  LOWER EXTREMITY ROM:     Active   WFL throughout  Right Eval Left Eval  Hip flexion    Hip extension    Hip abduction    Hip adduction    Hip internal rotation    Hip external rotation    Knee flexion    Knee extension    Ankle dorsiflexion    Ankle plantarflexion    Ankle inversion    Ankle eversion     (Blank rows = not tested)  LOWER EXTREMITY MMT:    MMT Right Eval Left Eval  Hip flexion 4- 4  Hip extension    Hip abduction    Hip adduction    Hip internal rotation    Hip external rotation    Knee flexion 4 4+  Knee extension 4+ 4+  Ankle dorsiflexion 4 4  Ankle plantarflexion 4 4  Ankle inversion    Ankle eversion    (Blank rows = not tested)  Manual Muscle Test Scale 0/5 = No muscle contraction can be seen or felt 1/5 = Contraction can be felt, but there is no motion 2-/5 = Part moves through incomplete ROM w/ gravity decreased 2/5 = Part moves through complete ROM w/ gravity decreased 2+/5 = Part moves through incomplete ROM (<50%) against gravity or through complete ROM w/ gravity 3-/5 = Part moves through incomplete ROM (>50%) against gravity 3/5 = Part moves through complete ROM against gravity 3+/5 = Part moves through complete ROM against gravity/slight resistance 4-/5= Holds test position against slight to moderate pressure 4/5 = Part moves through complete ROM against gravity/moderate resistance 4+/5= Holds test position against moderate to strong pressure 5/5 = Part moves through complete ROM against gravity/full resistance  BED MOBILITY:  Sit to supine Modified independence - per  pt report Supine to sit Modified independence - per pt report Pt reports minor difficulty rolling over in bed due to clothes getting caught on sheets  TRANSFERS: Assistive device utilized: None  Sit to stand: Modified independence Stand to sit: Modified independence Chair to chair: Modified independence Floor:  not assessed, would benefit from testing in future  RAMP:  Level of Assistance:  Assistive device utilized:    Ramp Comments: Not assessed, may benefit from testing in future  CURB:  Level of Assistance:    Assistive device utilized:    Curb Comments: Not assessed, would benefit from testing in future  STAIRS: Level of Assistance:    Stair Negotiation Technique: Forwards with Single Rail on Left Number of Stairs:   Height of Stairs: 6 inch  Comments: Not assessed, would benefit from testing in future  GAIT: Gait pattern: step through pattern, decreased arm swing- Right, decreased arm swing- Left, and narrow BOS Distance walked: ~34ft Assistive device utilized: None Level of assistance: SBA and CGA Comments:   FUNCTIONAL TESTS:  5 times sit to stand: 16.49 seconds with arms across chest 6 minute walk test: need to assess in next 1-2 visits 10 meter walk test: 0.827 m/s no AD Mini-Best Test:   between 11-13/28 (need to assess TUG vs TUG cognitive)  PATIENT SURVEYS:  ABC scale 77.81%  TREATMENT DATE: 10/26/23  Unless otherwise stated, CGA/SBA was provided and gait belt donned in order to ensure pt safety throughout session. TherAct:  Forward reach down to floor then up 10x  Ambulate with dual task naming food in alphabetical order; 1lb AW on hands; 2lb on ankles; painful to L knee on 2nd lap: 150 ft.   ambulate in hallway: -horizontal head turns with cues for reading alphabet from cards 86 ftx 2 sets -horizontal head turns with cues  for reading number and symbols from cards 86 ft x 2 sets  -"red light/green light" for sudden initiation/termination of ambulation with close CGA for carryover to natural environment 2x 86 ft   Neuro Re-ed  Standing with CGA next to support surface:  Airex pad: static stand 30 seconds x 2 trials, noticeable trembling of ankles/LE's with fatigue and challenge to maintain stability Airex pad: horizontal head turns 30 seconds scanning room 10x ; cueing for arc of motion  Airex pad: vertical head turns 30 seconds, cueing for arc of motion, noticeable sway with upward gaze increasing demand on ankle righting reaction musculature Airex pad: marching 10x each LE  Airex pad: hedgehogs 10x each LE;   Seated on dynadisc:  -static position 30 seconds -anterior/posterior weight shift 10x -medial/lateral weight shift 10x    PATIENT EDUCATION: Education details: Findings on outcome measure, therapy POC, initial HEP Person educated: Patient Education method: Explanation Education comprehension: verbalized understanding and needs further education  HOME EXERCISE PROGRAM:  Access Code: [email or web address] URL: [email or web address] Date: 10/12/2023 Prepared by: Casimiro Needle  Exercises - Sit to Stand with Arms Crossed  - 1 x daily - 7 x weekly - 2 sets - 10 reps - Heel Raises with Counter Support  - 1 x daily - 7 x weekly - 2 sets - 10 reps - Standing Near Stance in Corner  - 1 x daily - 7 x weekly - 2 sets - 30 seconds hold - Standing Romberg to 3/4 Tandem Stance  - 1 x daily - 7 x weekly - 2 sets - 30 seconds hold   GOALS: Goals reviewed with patient? Yes  SHORT TERM GOALS: Target date: 11/17/2023  Patient will be independent in home exercise program to improve strength/mobility for better functional independence with ADLs. Baseline: initiated, but needs to be expanded upon 10/12/2023: printout provided Goal status: INITIAL   LONG TERM GOALS: Target date: 12/29/2023  1.  Patient  (> 75 years old) will complete five times sit to stand test in < 15 seconds indicating an increased LE strength and improved balance. Baseline: 16.49 seconds with arms across chest Goal status: INITIAL  2.  Patient will increase 10 meter walk test to >1.44m/s as to improve gait speed for better community ambulation and to reduce fall risk. Baseline:  0.827 m/s without AD Goal status: INITIAL  3. Patient will increase six minute walk test distance to >1000 for progression to community ambulator and improve gait ability Baseline: 10/12/2023: 791 feet (241 meters) Goal status: INITIAL  4. Patient will increase MiniBest Test score to >18/28 to indicate a reduced risk for falling and demonstrate increased independence with functional mobility and ADLs.  Baseline: 12/28  Goal status: INITIAL  5. Patient will increase ABC scale score >80% to demonstrate better functional mobility and better confidence with ADLs.   Baseline: 77.81%  Goal status: INITIAL   ASSESSMENT:  CLINICAL IMPRESSION:  *** Ms. Caratachea will benefit from further skilled PT to improve her functional deficits in order to increase QOL,  ease/safety with ADLs, and promote safe community level mobility.   OBJECTIVE IMPAIRMENTS: Abnormal gait, decreased balance, decreased endurance, difficulty walking, and decreased strength.   ACTIVITY LIMITATIONS: carrying, lifting, bending, standing, squatting, sleeping, stairs, bed mobility, reach over head, and locomotion level  PARTICIPATION LIMITATIONS: cleaning, laundry, shopping, and community activity  PERSONAL FACTORS: Age, Fitness, Sex, Time since onset of injury/illness/exacerbation, and 1-2 comorbidities: Parkinson's, HTN, hx of low back pain  are also affecting patient's functional outcome.   REHAB POTENTIAL: Good  CLINICAL DECISION MAKING: Evolving/moderate complexity  EVALUATION COMPLEXITY: Moderate  PLAN:  PT FREQUENCY: 1-2x/week  PT DURATION: 12 weeks  PLANNED  INTERVENTIONS: 97164- PT Re-evaluation, 97110-Therapeutic exercises, 97530- Therapeutic activity, 97112- Neuromuscular re-education, 97535- Self Care, 16109- Manual therapy, [phone]- Gait training, [phone]- Canalith repositioning, Patient/Family education, Balance training, Stair training, Vestibular training, DME instructions, Cryotherapy, Moist heat, and Biofeedback  PLAN FOR NEXT SESSION:  - follow-up on L knee pain -dynamic gait/ dual tasks - midline orientation   Precious Bard, PT, DPT Physical Therapist - [facility]  Outpatient Physical Therapy- Main Campus [phone]     3:38 PM 10/26/23

## 2023-10-27 ENCOUNTER — Ambulatory Visit

## 2023-11-04 ENCOUNTER — Ambulatory Visit: Attending: Neurology | Admitting: Physical Therapy

## 2023-11-04 DIAGNOSIS — R269 Unspecified abnormalities of gait and mobility: Secondary | ICD-10-CM | POA: Insufficient documentation

## 2023-11-04 DIAGNOSIS — R531 Weakness: Secondary | ICD-10-CM | POA: Insufficient documentation

## 2023-11-04 DIAGNOSIS — R2681 Unsteadiness on feet: Secondary | ICD-10-CM | POA: Insufficient documentation

## 2023-11-04 NOTE — Therapy (Signed)
 OUTPATIENT PHYSICAL THERAPY NEURO TREATMENT   Patient Name: Helen Marshall MRN: [id number] DOB:1945-09-18, 78 y.o., female Today's Date: 11/04/2023   PCP: Dale Calimesa, MD REFERRING PROVIDER: Vladimir Faster, DO   END OF SESSION:   PT End of Session - 11/04/23 0904     Visit Number 4    Number of Visits 24    Date for PT Re-Evaluation 12/29/23    PT Start Time 0904    PT Stop Time 0945    PT Time Calculation (min) 41 min    Equipment Utilized During Treatment Gait belt    Activity Tolerance Patient tolerated treatment well    Behavior During Therapy [facility] for tasks assessed/performed              Past Medical History:  Diagnosis Date   Abnormal LFTs    Abnormal mammogram 12/19/2012   left   Allergy    Anemia    Arthritis    Cancer (HCC) 2014   left Breast   Carotid bruit    left   Cataract    Colon polyps    Diverticulosis 2012   Environmental allergies    Family history of adverse reaction to anesthesia    her aunt was hard to wake up   GERD (gastroesophageal reflux disease)    Heart murmur    History of diverticulitis 05/2021   Hypercholesterolemia    Hyperglycemia    Hypertension    IBS (irritable bowel syndrome)    Lipoma of colon    Lumbar disc disease    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   Pre-diabetes    Tubular adenoma of colon    Umbilical hernia 05/2021   Past Surgical History:  Procedure Laterality Date   ABDOMINAL HYSTERECTOMY  1989   fibroids   ANKLE SURGERY Left 1999   ANTERIOR AND POSTERIOR REPAIR WITH SACROSPINOUS FIXATION N/A 07/20/2021   Procedure: ANTERIOR AND REPAIR WITH SACROSPINOUS FIXATION;  Surgeon: Marguerita Beards, MD;  Location: [facility];  Service: Gynecology;  Laterality: N/A;    BREAST SURGERY Left 1970   biopsy   CATARACT EXTRACTION, BILATERAL  June & July 2017   CHOLECYSTECTOMY  2001   COLONOSCOPY  06/02/2011   Lutricia Feil, MD; diverticulosis, submucosal lipoma of the sigmoid colon.   CYSTOSCOPY N/A 07/20/2021   Procedure: CYSTOSCOPY;  Surgeon: Marguerita Beards, MD;  Location: [facility];  Service: Gynecology;  Laterality: N/A;   eyelid lift Bilateral    HERNIA REPAIR  2001, 2004   HERNIA REPAIR  01/22/2013   Repeat repair of umbilical defect, 2.5 cm. Primary repair.   INSERTION OF MESH N/A 03/31/2022   Procedure: INSERTION OF MESH;  Surgeon: [name], MD;  Location: [facility];  Service: General;  Laterality: N/A;   LAPAROSCOPIC HYSTERECTOMY     LUMBAR LAMINECTOMY/DECOMPRESSION MICRODISCECTOMY Bilateral 06/18/2022   Procedure: Bilateral L3-4 Lumbar decompression;  Surgeon: Coletta Memos, MD;  Location: [facility];  Service: Neurosurgery;  Laterality: Bilateral;   NASAL SINUS SURGERY  1997   rotator cuff surgery  1998   SQUAMOUS CELL CARCINOMA EXCISION Right 05/2013   shoulder   TUBAL LIGATION  1976   UPPER GI ENDOSCOPY  08/22/2014   Dr Shela Commons. Rhea Belton   VENTRAL HERNIA REPAIR N/A 03/31/2022  Procedure: HERNIA REPAIR VENTRAL ADULT;  Surgeon: [name], MD;  Location: [facility];  Service: General;  Laterality: N/A;  Sonda Rumble, RNFA to assist TAPP block per anesthesia   Patient Active Problem List   Diagnosis Date Noted   Ear fullness, right 08/22/2023   Belching 07/09/2023   Breast tenderness 07/09/2023   Neuropathy 07/09/2023   Urinary tract infection 07/04/2023   Grief reaction with prolonged bereavement 07/04/2023   Parkinson's disease (HCC) 09/30/2022   Leukocytosis 09/28/2022   Lumbar stenosis with neurogenic claudication 06/18/2022   Dermatochalasis of both eyelids 06/12/2022   Squamous cell skin cancer 05/30/2022   Tremor 05/30/2022   History of breast cancer 09/29/2021   Pain around toenail 09/19/2021   Knee  pain 09/19/2021   Lung nodule 04/26/2021   Back pain 07/23/2020   Tremor of both hands 03/16/2020   Vaginal prolapse 03/16/2020   Hand pain, left 06/06/2019   Osteopenia 05/27/2019   Estrogen deficiency 04/22/2019   Sinus infection 07/21/2017   Anemia 11/05/2015   Health care maintenance 11/17/2014   Umbilical hernia 01/17/2013   Neoplasm of left breast, 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   GERD (gastroesophageal reflux disease) 07/22/2012   Diverticulosis 07/21/2012   Hypertension 07/21/2012   Hypercholesterolemia 07/21/2012   Abnormal liver function test 07/21/2012   Left carotid bruit 07/21/2012   Hyperglycemia 07/21/2012   Environmental allergies 07/21/2012   Lumbar disc disease 07/21/2012    ONSET DATE: Parkinson's Disease, diagnosed February, 2024  REFERRING DIAG: G20.A1 (ICD-10-CM) - Parkinson's disease without dyskinesia or fluctuating manifestations (HCC)   THERAPY DIAG:  Unsteadiness on feet  Abnormality of gait  Generalized weakness  Rationale for Evaluation and Treatment: Rehabilitation  SUBJECTIVE:                                                                                                                                                                                             SUBJECTIVE STATEMENT:  Pt arrived late to therapy session and is apologetic stating she hasn't been sleeping well. Reports her sister recently had a seizure and was in the hospital and her brother-in-law passed away a few days ago.  Pt reports her knees are not feeling good, states she may need to see a knee specialist. Reports today they are not as tight as some days.  Pt accompanied by: self  PERTINENT HISTORY: Diagnosed with Parkinson's Disease February 2024, Hx of Back Pain s/p lumbar laminectomy November 2023, HTN  PAIN:  Are you having pain? No  PRECAUTIONS: Fall, Latex Allergy  RED FLAGS: None   WEIGHT BEARING  RESTRICTIONS: No  FALLS: Has patient fallen in last 6 months? No  LIVING ENVIRONMENT: Lives with: lives with their spouse [name]) Lives in: House/apartment Stairs: Yes: Internal: flight steps; on left going up and External: 2 steps; no HR but pt will use a cane for support (does have a back entrance without steps) Pt reports she does have a chair lift to go up the flight of steps Has following equipment at home: Single point cane, Quad cane small base, Walker - 4 wheeled, Wheelchair (manual), shower chair, Grab bars, and transport chair  PLOF: Independent  PATIENT GOALS: Walk without feeling clumsy, Come to stand with more ease  OBJECTIVE:  Note: Objective measures were completed at Evaluation unless otherwise noted.  DIAGNOSTIC FINDINGS: N/A  COGNITION: Overall cognitive status: Within functional limits for tasks assessed   SENSATION: WFL  COORDINATION: No significant bradykinesia noted with patient's movements  EDEMA:  None observed  MUSCLE TONE: Not formally assessed  MUSCLE LENGTH: Not formally assessed  DTRs:  Not formally assessed  POSTURE: rounded shoulders and forward head  LOWER EXTREMITY ROM:     Active   WFL throughout  Right Eval Left Eval  Hip flexion    Hip extension    Hip abduction    Hip adduction    Hip internal rotation    Hip external rotation    Knee flexion    Knee extension    Ankle dorsiflexion    Ankle plantarflexion    Ankle inversion    Ankle eversion     (Blank rows = not tested)  LOWER EXTREMITY MMT:    MMT Right Eval Left Eval  Hip flexion 4- 4  Hip extension    Hip abduction    Hip adduction    Hip internal rotation    Hip external rotation    Knee flexion 4 4+  Knee extension 4+ 4+  Ankle dorsiflexion 4 4  Ankle plantarflexion 4 4  Ankle inversion    Ankle eversion    (Blank rows = not tested)  Manual Muscle Test Scale 0/5 = No muscle contraction can be seen or felt 1/5 = Contraction can be felt, but  there is no motion 2-/5 = Part moves through incomplete ROM w/ gravity decreased 2/5 = Part moves through complete ROM w/ gravity decreased 2+/5 = Part moves through incomplete ROM (<50%) against gravity or through complete ROM w/ gravity 3-/5 = Part moves through incomplete ROM (>50%) against gravity 3/5 = Part moves through complete ROM against gravity 3+/5 = Part moves through complete ROM against gravity/slight resistance 4-/5= Holds test position against slight to moderate pressure 4/5 = Part moves through complete ROM against gravity/moderate resistance 4+/5= Holds test position against moderate to strong pressure 5/5 = Part moves through complete ROM against gravity/full resistance  BED MOBILITY:  Sit to supine Modified independence - per pt report Supine to sit Modified independence - per pt report Pt reports minor difficulty rolling over in bed due to clothes getting caught on sheets  TRANSFERS: Assistive device utilized: None  Sit to stand: Modified independence Stand to sit: Modified independence Chair to chair: Modified independence Floor:  not assessed, would benefit from testing in future  RAMP:  Level of Assistance:  Assistive device utilized:    Ramp Comments: Not assessed, may benefit from testing in future  CURB:  Level of Assistance:    Assistive device utilized:    Curb Comments: Not assessed, would benefit from testing in future  STAIRS: Level of  Assistance:    Stair Negotiation Technique: Forwards with Single Rail on Left Number of Stairs:   Height of Stairs: 6 inch  Comments: Not assessed, would benefit from testing in future  GAIT: Gait pattern: step through pattern, decreased arm swing- Right, decreased arm swing- Left, and narrow BOS Distance walked: ~324ft Assistive device utilized: None Level of assistance: SBA and CGA Comments:   FUNCTIONAL TESTS:  5 times sit to stand: 16.49 seconds with arms across chest 6 minute walk test: need to  assess in next 1-2 visits 10 meter walk test: 0.827 m/s no AD Mini-Best Test:   between 11-13/28 (need to assess TUG vs TUG cognitive)  PATIENT SURVEYS:  ABC scale 77.81%                                                                                                                              TREATMENT DATE: 11/04/23   Unless otherwise stated, CGA/SBA was provided and gait belt donned in order to ensure pt safety throughout session.  Pt ambulates in/out of therapy clinic, no AD, with slow, antalgic gait pattern.   B UE and B LE reciprocal movement pattern training on Nustep against level 3 resistance for 5 minutes totaling 143 steps. Pt reporting sudden onset of R knee "catching" during each flexion movement on the Nustep so discontinued it at this time.  Supine bridges x15reps added level 3 green theraband for increased hip abductor activation 2x15reps   Supine hooklying hip abduction against level 3 theraband resistance 2x15reps  Supine R LE hip/knee flexion AROM x12 reps with pt denying any pain at this time   Seated R LE long arc quad 3lb AW 2x15reps increased to 5lb AW 2x15reps, pt with occasional "popping" in her R knee but denies pain with it during this exercise  Standing balance including:  - narrow BOS eyes closed x30ec with no significant sway - 1/2 tandem eyes closed 2x 30sec each with pt having significant challenge with this but improved on 2nd set   PATIENT EDUCATION: Education details: Findings on outcome measure, therapy POC, initial HEP Person educated: Patient Education method: Explanation Education comprehension: verbalized understanding and needs further education  HOME EXERCISE PROGRAM:  Access Code: [email or web address] URL: [email or web address] Date: 10/12/2023 Prepared by: Casimiro Needle  Exercises - Sit to Stand with Arms Crossed  - 1 x daily - 7 x weekly - 2 sets - 10 reps - Heel Raises with Counter Support  - 1 x daily - 7 x weekly - 2 sets  - 10 reps - Standing Near Stance in Corner  - 1 x daily - 7 x weekly - 2 sets - 30 seconds hold - Standing Romberg to 3/4 Tandem Stance  - 1 x daily - 7 x weekly - 2 sets - 30 seconds hold   GOALS: Goals reviewed with patient? Yes  SHORT TERM GOALS: Target date: 11/17/2023  Patient will be independent in home exercise program to improve strength/mobility  for better functional independence with ADLs. Baseline: initiated, but needs to be expanded upon 10/12/2023: printout provided Goal status: INITIAL   LONG TERM GOALS: Target date: 12/29/2023  1.  Patient (> 2 years old) will complete five times sit to stand test in < 15 seconds indicating an increased LE strength and improved balance. Baseline: 16.49 seconds with arms across chest Goal status: INITIAL  2.  Patient will increase 10 meter walk test to >1.59m/s as to improve gait speed for better community ambulation and to reduce fall risk. Baseline:  0.827 m/s without AD Goal status: INITIAL  3. Patient will increase six minute walk test distance to >1000 for progression to community ambulator and improve gait ability Baseline: 10/12/2023: 791 feet (241 meters) Goal status: INITIAL  4. Patient will increase MiniBest Test score to >18/28 to indicate a reduced risk for falling and demonstrate increased independence with functional mobility and ADLs.  Baseline: 12/28  Goal status: INITIAL  5. Patient will increase ABC scale score >80% to demonstrate better functional mobility and better confidence with ADLs.   Baseline: 77.81%  Goal status: INITIAL   ASSESSMENT:  CLINICAL IMPRESSION:  Therapy session focused on B LE strengthening of hip musculature to help manage R knee pain to improve participation in mobility tasks as well as standing balance interventions with eyes closed. Pt tolerated supine and seated strengthening exercises without increase in R knee pain, but did have some R knee "catching" when using the Nustep limiting  participation in this. Pt with LOB during initial 1/2 tandem stance with eyes closed, but this improved on 2nd set. Ms. Mounsey will benefit from further skilled PT to improve her functional deficits in order to increase QOL, ease/safety with ADLs, and promote safe community level mobility.   OBJECTIVE IMPAIRMENTS: Abnormal gait, decreased balance, decreased endurance, difficulty walking, and decreased strength.   ACTIVITY LIMITATIONS: carrying, lifting, bending, standing, squatting, sleeping, stairs, bed mobility, reach over head, and locomotion level  PARTICIPATION LIMITATIONS: cleaning, laundry, shopping, and community activity  PERSONAL FACTORS: Age, Fitness, Sex, Time since onset of injury/illness/exacerbation, and 1-2 comorbidities: Parkinson's, HTN, hx of low back pain  are also affecting patient's functional outcome.   REHAB POTENTIAL: Good  CLINICAL DECISION MAKING: Evolving/moderate complexity  EVALUATION COMPLEXITY: Moderate  PLAN:  PT FREQUENCY: 1-2x/week  PT DURATION: 12 weeks  PLANNED INTERVENTIONS: 97164- PT Re-evaluation, 97110-Therapeutic exercises, 97530- Therapeutic activity, 97112- Neuromuscular re-education, 97535- Self Care, [id number]- Manual therapy, [phone]- Gait training, [phone]- Canalith repositioning, Patient/Family education, Balance training, Stair training, Vestibular training, DME instructions, Cryotherapy, Moist heat, and Biofeedback  PLAN FOR NEXT SESSION:  - follow-up on R knee pain -dynamic gait/ dual tasks - midline orientation  - standing balance  - 1/2 tandem  - eyes closed  - posterior stepping retraining   Keelyn Fjelstad, PT, DPT, NCS, CSRS Physical Therapist - [place]  [facility]  9:57 AM 11/04/23

## 2023-11-08 ENCOUNTER — Ambulatory Visit: Admitting: Physical Therapy

## 2023-11-08 DIAGNOSIS — R269 Unspecified abnormalities of gait and mobility: Secondary | ICD-10-CM

## 2023-11-08 DIAGNOSIS — R2681 Unsteadiness on feet: Secondary | ICD-10-CM

## 2023-11-08 DIAGNOSIS — R531 Weakness: Secondary | ICD-10-CM

## 2023-11-08 NOTE — Therapy (Signed)
 OUTPATIENT PHYSICAL THERAPY NEURO TREATMENT   Patient Name: Helen Marshall MRN: [id number] DOB:01-14-1946, 78 y.o., female Today's Date: 11/09/2023   PCP: Dale [place], MD REFERRING PROVIDER: Tat, Octaviano Batty, DO   END OF SESSION:   PT End of Session - 11/08/23 1020     Visit Number 5    Number of Visits 24    Date for PT Re-Evaluation 12/29/23    PT Start Time 1020    PT Stop Time 1059    PT Time Calculation (min) 39 min    Equipment Utilized During Treatment Gait belt    Activity Tolerance Patient tolerated treatment well    Behavior During Therapy WFL for tasks assessed/performed               Past Medical History:  Diagnosis Date   Abnormal LFTs    Abnormal mammogram 12/19/2012   left   Allergy    Anemia    Arthritis    Cancer (HCC) 2014   left Breast   Carotid bruit    left   Cataract    Colon polyps    Diverticulosis 2012   Environmental allergies    Family history of adverse reaction to anesthesia    her aunt was hard to wake up   GERD (gastroesophageal reflux disease)    Heart murmur    History of diverticulitis 05/2021   Hypercholesterolemia    Hyperglycemia    Hypertension    IBS (irritable bowel syndrome)    Lipoma of colon    Lumbar disc disease    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   Pre-diabetes    Tubular adenoma of colon    Umbilical hernia 05/2021   Past Surgical History:  Procedure Laterality Date   ABDOMINAL HYSTERECTOMY  1989   fibroids   ANKLE SURGERY Left 1999   ANTERIOR AND POSTERIOR REPAIR WITH SACROSPINOUS FIXATION N/A 07/20/2021   Procedure: ANTERIOR AND REPAIR WITH SACROSPINOUS FIXATION;  Surgeon: Marguerita Beards, MD;  Location: [facility];  Service: Gynecology;  Laterality: N/A;    BREAST SURGERY Left 1970   biopsy   CATARACT EXTRACTION, BILATERAL  June & July 2017   CHOLECYSTECTOMY  2001   COLONOSCOPY  06/02/2011   Lutricia Feil, MD; diverticulosis, submucosal lipoma of the sigmoid colon.   CYSTOSCOPY N/A 07/20/2021   Procedure: CYSTOSCOPY;  Surgeon: Marguerita Beards, MD;  Location: [facility];  Service: Gynecology;  Laterality: N/A;   eyelid lift Bilateral    HERNIA REPAIR  2001, 2004   HERNIA REPAIR  01/22/2013   Repeat repair of umbilical defect, 2.5 cm. Primary repair.   INSERTION OF MESH N/A 03/31/2022   Procedure: INSERTION OF MESH;  Surgeon: [name], MD;  Location: [facility];  Service: General;  Laterality: N/A;   LAPAROSCOPIC HYSTERECTOMY     LUMBAR LAMINECTOMY/DECOMPRESSION MICRODISCECTOMY Bilateral 06/18/2022   Procedure: Bilateral L3-4 Lumbar decompression;  Surgeon: Coletta Memos, MD;  Location: [facility];  Service: Neurosurgery;  Laterality: Bilateral;   NASAL SINUS SURGERY  1997   rotator cuff surgery  1998   SQUAMOUS CELL CARCINOMA EXCISION Right 05/2013   shoulder   TUBAL LIGATION  1976   UPPER GI ENDOSCOPY  08/22/2014   Dr Shela Commons. Rhea Belton   VENTRAL HERNIA REPAIR N/A 03/31/2022  Procedure: HERNIA REPAIR VENTRAL ADULT;  Surgeon: [name], MD;  Location: [facility];  Service: General;  Laterality: N/A;  Sonda Rumble, RNFA to assist TAPP block per anesthesia   Patient Active Problem List   Diagnosis Date Noted   Ear fullness, right 08/22/2023   Belching 07/09/2023   Breast tenderness 07/09/2023   Neuropathy 07/09/2023   Urinary tract infection 07/04/2023   Grief reaction with prolonged bereavement 07/04/2023   Parkinson's disease (HCC) 09/30/2022   Leukocytosis 09/28/2022   Lumbar stenosis with neurogenic claudication 06/18/2022   Dermatochalasis of both eyelids 06/12/2022   Squamous cell skin cancer 05/30/2022   Tremor 05/30/2022   History of breast cancer 09/29/2021   Pain around toenail 09/19/2021   Knee  pain 09/19/2021   Lung nodule 04/26/2021   Back pain 07/23/2020   Tremor of both hands 03/16/2020   Vaginal prolapse 03/16/2020   Hand pain, left 06/06/2019   Osteopenia 05/27/2019   Estrogen deficiency 04/22/2019   Sinus infection 07/21/2017   Anemia 11/05/2015   Health care maintenance 11/17/2014   Umbilical hernia 01/17/2013   Neoplasm of left breast, 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   GERD (gastroesophageal reflux disease) 07/22/2012   Diverticulosis 07/21/2012   Hypertension 07/21/2012   Hypercholesterolemia 07/21/2012   Abnormal liver function test 07/21/2012   Left carotid bruit 07/21/2012   Hyperglycemia 07/21/2012   Environmental allergies 07/21/2012   Lumbar disc disease 07/21/2012    ONSET DATE: Parkinson's Disease, diagnosed February, 2024  REFERRING DIAG: G20.A1 (ICD-10-CM) - Parkinson's disease without dyskinesia or fluctuating manifestations (HCC)   THERAPY DIAG:  Unsteadiness on feet  Abnormality of gait  Generalized weakness  Rationale for Evaluation and Treatment: Rehabilitation  SUBJECTIVE:                                                                                                                                                                                             SUBJECTIVE STATEMENT:  Pt reports she is "a little achy today." States she had a busy weekend with her brother-in-law's funeral and being there for her sister.   Reports her R knee is still bothering her, states she took  2 ibuprofen this morning to help with pain management.   Reports she finally slept better last night, but states she has been having a lot of indigestion. Reports she has felt constipated recently and has taken a barley and aloe mixture that another person with Parkinson's has recommended to her.   Pt accompanied by: self  PERTINENT HISTORY: Diagnosed with Parkinson's Disease February 2024, Hx of Back Pain s/p  lumbar laminectomy November 2023, HTN  PAIN:  Are you having pain? No  PRECAUTIONS: Fall, Latex Allergy  RED FLAGS: None   WEIGHT BEARING RESTRICTIONS: No  FALLS: Has patient fallen in last 6 months? No  LIVING ENVIRONMENT: Lives with: lives with their spouse [name]) Lives in: House/apartment Stairs: Yes: Internal: flight steps; on left going up and External: 2 steps; no HR but pt will use a cane for support (does have a back entrance without steps) Pt reports she does have a chair lift to go up the flight of steps Has following equipment at home: Single point cane, Quad cane small base, Walker - 4 wheeled, Wheelchair (manual), shower chair, Grab bars, and transport chair  PLOF: Independent  PATIENT GOALS: Walk without feeling clumsy, Come to stand with more ease  OBJECTIVE:  Note: Objective measures were completed at Evaluation unless otherwise noted.  DIAGNOSTIC FINDINGS: N/A  COGNITION: Overall cognitive status: Within functional limits for tasks assessed   SENSATION: WFL  COORDINATION: No significant bradykinesia noted with patient's movements  EDEMA:  None observed  MUSCLE TONE: Not formally assessed  MUSCLE LENGTH: Not formally assessed  DTRs:  Not formally assessed  POSTURE: rounded shoulders and forward head  LOWER EXTREMITY ROM:     Active   WFL throughout  Right Eval Left Eval  Hip flexion    Hip extension    Hip abduction    Hip adduction    Hip internal rotation    Hip external rotation    Knee flexion    Knee extension    Ankle dorsiflexion    Ankle plantarflexion    Ankle inversion    Ankle eversion     (Blank rows = not tested)  LOWER EXTREMITY MMT:    MMT Right Eval Left Eval  Hip flexion 4- 4  Hip extension    Hip abduction    Hip adduction    Hip internal rotation    Hip external rotation    Knee flexion 4 4+  Knee extension 4+ 4+  Ankle dorsiflexion 4 4  Ankle plantarflexion 4 4  Ankle inversion    Ankle  eversion    (Blank rows = not tested)  Manual Muscle Test Scale 0/5 = No muscle contraction can be seen or felt 1/5 = Contraction can be felt, but there is no motion 2-/5 = Part moves through incomplete ROM w/ gravity decreased 2/5 = Part moves through complete ROM w/ gravity decreased 2+/5 = Part moves through incomplete ROM (<50%) against gravity or through complete ROM w/ gravity 3-/5 = Part moves through incomplete ROM (>50%) against gravity 3/5 = Part moves through complete ROM against gravity 3+/5 = Part moves through complete ROM against gravity/slight resistance 4-/5= Holds test position against slight to moderate pressure 4/5 = Part moves through complete ROM against gravity/moderate resistance 4+/5= Holds test position against moderate to strong pressure 5/5 = Part moves through complete ROM against gravity/full resistance  BED MOBILITY:  Sit to supine Modified independence - per pt report Supine to sit Modified independence - per pt report Pt reports minor difficulty rolling over in bed due to clothes getting caught on sheets  TRANSFERS: Assistive device utilized: None  Sit to stand: Modified independence Stand to sit: Modified independence Chair to chair: Modified independence Floor:  not assessed, would benefit from testing in future  RAMP:  Level of Assistance:  Assistive device utilized:    Ramp Comments: Not assessed, may benefit from testing in future  CURB:  Level of Assistance:    Assistive device utilized:    Curb Comments: Not assessed, would benefit from testing in future  STAIRS: Level of Assistance:    Stair Negotiation Technique: Forwards with Single Rail on Left Number of Stairs:   Height of Stairs: 6 inch  Comments: Not assessed, would benefit from testing in future  GAIT: Gait pattern: step through pattern, decreased arm swing- Right, decreased arm swing- Left, and narrow BOS Distance walked: ~388ft Assistive device utilized: None Level of  assistance: SBA and CGA Comments:   FUNCTIONAL TESTS:  5 times sit to stand: 16.49 seconds with arms across chest 6 minute walk test: need to assess in next 1-2 visits 10 meter walk test: 0.827 m/s no AD Mini-Best Test:   between 11-13/28 (need to assess TUG vs TUG cognitive)  PATIENT SURVEYS:  ABC scale 77.81%                                                                                                                              TREATMENT DATE: 11/09/23  Unless otherwise stated, CGA/SBA was provided and gait belt donned in order to ensure pt safety throughout session.  Pt ambulates in/out of therapy clinic, no AD, with slow, antalgic gait pattern due to continued knee pain/discomfort.  B UE and B LE reciprocal movement pattern training on Octane against level 4 resistance for 5 minutes totaling 0.59mi with goal of maintaining speed >30. Pt reports this machine is more comfortable than the Nustep.   Standing heel raises 2x 15reps - this continues to be challenging for pt, lacking full heel clearance  Standing balance including:  - attempted single leg stance, but pt unable - 3/4 tandem x30sec each  - progressed to 3/4 tandem eyes closed 2x 30sec each with this being very challenging for pt  - regressed to 1/2 tandem with eyes closed x30sec each with this being a safe challenge and updated HEP below  Sit<>stands from chair, no UE support, x4 reps with pt reporting some discomfort "twinges" in R knee - added level 3 green theraband around knees to increase hip abductor activation to determine if it changes the pain, but pt denies change in pain x12 reps - therapist cuing for proper form/technique  Forward/backwards stepping over hurdle with 4 Blaze Pods placed around (2 in front and 2 behind) - requires min A primarily when stepping backwards with pt very hesitant with this and feeling unstable maintaining single leg stance - 26 Hits   Provided pt with updated HEP.    PATIENT  EDUCATION: Education details: Findings on outcome measure, therapy POC, initial HEP Person educated: Patient Education method: Explanation Education comprehension: verbalized understanding and needs further education  HOME EXERCISE PROGRAM:  Access Code: [email or web address] URL: [email or web address] Date: 11/08/2023 Prepared by: Casimiro Needle  Exercises - Sit to Stand with Arms Crossed  - 1 x daily - 7 x weekly - 2 sets - 10 reps - Heel Raises with  Counter Support  - 1 x daily - 7 x weekly - 2 sets - 15 reps - Standing 1/2 Tandem Stance with Eyes Closed  - 1 x daily - 7 x weekly - 2 sets - 30 seconds hold - Standing Romberg to 3/4 Tandem Stance  - 1 x daily - 7 x weekly - 2 sets - 30 seconds hold   GOALS: Goals reviewed with patient? Yes  SHORT TERM GOALS: Target date: 11/17/2023  Patient will be independent in home exercise program to improve strength/mobility for better functional independence with ADLs. Baseline: initiated, but needs to be expanded upon 10/12/2023: printout provided Goal status: INITIAL   LONG TERM GOALS: Target date: 12/29/2023  1.  Patient (> 36 years old) will complete five times sit to stand test in < 15 seconds indicating an increased LE strength and improved balance. Baseline: 16.49 seconds with arms across chest Goal status: INITIAL  2.  Patient will increase 10 meter walk test to >1.63m/s as to improve gait speed for better community ambulation and to reduce fall risk. Baseline:  0.827 m/s without AD Goal status: INITIAL  3. Patient will increase six minute walk test distance to >1000 for progression to community ambulator and improve gait ability Baseline: 10/12/2023: 791 feet (241 meters) Goal status: INITIAL  4. Patient will increase MiniBest Test score to >18/28 to indicate a reduced risk for falling and demonstrate increased independence with functional mobility and ADLs.  Baseline: 12/28  Goal status: INITIAL  5. Patient will increase  ABC scale score >80% to demonstrate better functional mobility and better confidence with ADLs.   Baseline: 77.81%  Goal status: INITIAL   ASSESSMENT:  CLINICAL IMPRESSION:  Pt arrived highly motivated to participate in therapy session. Pt continues to have antalgic gait pattern due to knee pain and responds better to use of Octane for cardiovascular training vs the Nustep. Therapy session focused on functional B LE strengthening with pt continuing to demonstrate weakness on heel raise and has some R knee pain with sit<>stands. Therapy session also focused on standing balance with pt continuing to have great difficulty with progression of tandem stance, especially with eyes closed, as well as instability and decreased confidence with posterior stepping over obstacle. Updated pt's HEP to reflect overall improvement with standing balance exercises. Ms. Cosby will benefit from further skilled PT to improve her functional deficits in order to increase QOL, ease/safety with ADLs, and promote safe community level mobility.   OBJECTIVE IMPAIRMENTS: Abnormal gait, decreased balance, decreased endurance, difficulty walking, and decreased strength.   ACTIVITY LIMITATIONS: carrying, lifting, bending, standing, squatting, sleeping, stairs, bed mobility, reach over head, and locomotion level  PARTICIPATION LIMITATIONS: cleaning, laundry, shopping, and community activity  PERSONAL FACTORS: Age, Fitness, Sex, Time since onset of injury/illness/exacerbation, and 1-2 comorbidities: Parkinson's, HTN, hx of low back pain  are also affecting patient's functional outcome.   REHAB POTENTIAL: Good  CLINICAL DECISION MAKING: Evolving/moderate complexity  EVALUATION COMPLEXITY: Moderate  PLAN:  PT FREQUENCY: 1-2x/week  PT DURATION: 12 weeks  PLANNED INTERVENTIONS: [id number]- PT Re-evaluation, 97110-Therapeutic exercises, 97530- Therapeutic activity, 97112- Neuromuscular re-education, 97535- Self Care, [id number]-  Manual therapy, [phone]- Gait training, [phone]- Canalith repositioning, Patient/Family education, Balance training, Stair training, Vestibular training, DME instructions, Cryotherapy, Moist heat, and Biofeedback  PLAN FOR NEXT SESSION:  - Octane machine (not Nustep) - follow-up on R knee pain -dynamic gait/ dual tasks  - stepping backwards over hurdle - midline orientation  - standing balance  - 1/2 tandem  -  eyes closed  - posterior stepping retraining   Casimiro Needle, PT, DPT, NCS, CSRS Physical Therapist - [place]  [facility]  5:55 PM 11/09/23

## 2023-11-11 ENCOUNTER — Encounter: Payer: Self-pay | Admitting: Physical Therapy

## 2023-11-11 ENCOUNTER — Ambulatory Visit

## 2023-11-11 DIAGNOSIS — R2681 Unsteadiness on feet: Secondary | ICD-10-CM

## 2023-11-11 DIAGNOSIS — R269 Unspecified abnormalities of gait and mobility: Secondary | ICD-10-CM

## 2023-11-11 DIAGNOSIS — R531 Weakness: Secondary | ICD-10-CM

## 2023-11-11 NOTE — Therapy (Signed)
 OUTPATIENT PHYSICAL THERAPY NEURO TREATMENT   Patient Name: Helen Marshall MRN: [id number] DOB:26-Sep-1945, 78 y.o., female Today's Date: 11/11/2023   PCP: Dale [place], MD REFERRING PROVIDER: Tat, Octaviano Batty, DO   END OF SESSION:   PT End of Session - 11/11/23 0930     Visit Number 6    Number of Visits 24    Date for PT Re-Evaluation 12/29/23    PT Start Time 0930    PT Stop Time 1011    PT Time Calculation (min) 41 min    Equipment Utilized During Treatment Gait belt    Activity Tolerance Patient tolerated treatment well    Behavior During Therapy WFL for tasks assessed/performed                Past Medical History:  Diagnosis Date   Abnormal LFTs    Abnormal mammogram 12/19/2012   left   Allergy    Anemia    Arthritis    Cancer (HCC) 2014   left Breast   Carotid bruit    left   Cataract    Colon polyps    Diverticulosis 2012   Environmental allergies    Family history of adverse reaction to anesthesia    her aunt was hard to wake up   GERD (gastroesophageal reflux disease)    Heart murmur    History of diverticulitis 05/2021   Hypercholesterolemia    Hyperglycemia    Hypertension    IBS (irritable bowel syndrome)    Lipoma of colon    Lumbar disc disease    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   Pre-diabetes    Tubular adenoma of colon    Umbilical hernia 05/2021   Past Surgical History:  Procedure Laterality Date   ABDOMINAL HYSTERECTOMY  1989   fibroids   ANKLE SURGERY Left 1999   ANTERIOR AND POSTERIOR REPAIR WITH SACROSPINOUS FIXATION N/A 07/20/2021   Procedure: ANTERIOR AND REPAIR WITH SACROSPINOUS FIXATION;  Surgeon: Marguerita Beards, MD;  Location: [facility];  Service: Gynecology;  Laterality: N/A;    BREAST SURGERY Left 1970   biopsy   CATARACT EXTRACTION, BILATERAL  June & July 2017   CHOLECYSTECTOMY  2001   COLONOSCOPY  06/02/2011   Lutricia Feil, MD; diverticulosis, submucosal lipoma of the sigmoid colon.   CYSTOSCOPY N/A 07/20/2021   Procedure: CYSTOSCOPY;  Surgeon: Marguerita Beards, MD;  Location: [facility];  Service: Gynecology;  Laterality: N/A;   eyelid lift Bilateral    HERNIA REPAIR  2001, 2004   HERNIA REPAIR  01/22/2013   Repeat repair of umbilical defect, 2.5 cm. Primary repair.   INSERTION OF MESH N/A 03/31/2022   Procedure: INSERTION OF MESH;  Surgeon: [name], MD;  Location: [facility];  Service: General;  Laterality: N/A;   LAPAROSCOPIC HYSTERECTOMY     LUMBAR LAMINECTOMY/DECOMPRESSION MICRODISCECTOMY Bilateral 06/18/2022   Procedure: Bilateral L3-4 Lumbar decompression;  Surgeon: Coletta Memos, MD;  Location: [facility];  Service: Neurosurgery;  Laterality: Bilateral;   NASAL SINUS SURGERY  1997   rotator cuff surgery  1998   SQUAMOUS CELL CARCINOMA EXCISION Right 05/2013   shoulder   TUBAL LIGATION  1976   UPPER GI ENDOSCOPY  08/22/2014   Dr Shela Commons. Rhea Belton   VENTRAL HERNIA REPAIR N/A 03/31/2022  Procedure: HERNIA REPAIR VENTRAL ADULT;  Surgeon: [name], MD;  Location: [facility];  Service: General;  Laterality: N/A;  Sonda Rumble, RNFA to assist TAPP block per anesthesia   Patient Active Problem List   Diagnosis Date Noted   Ear fullness, right 08/22/2023   Belching 07/09/2023   Breast tenderness 07/09/2023   Neuropathy 07/09/2023   Urinary tract infection 07/04/2023   Grief reaction with prolonged bereavement 07/04/2023   Parkinson's disease (HCC) 09/30/2022   Leukocytosis 09/28/2022   Lumbar stenosis with neurogenic claudication 06/18/2022   Dermatochalasis of both eyelids 06/12/2022   Squamous cell skin cancer 05/30/2022   Tremor 05/30/2022   History of breast cancer 09/29/2021   Pain around toenail 09/19/2021   Knee  pain 09/19/2021   Lung nodule 04/26/2021   Back pain 07/23/2020   Tremor of both hands 03/16/2020   Vaginal prolapse 03/16/2020   Hand pain, left 06/06/2019   Osteopenia 05/27/2019   Estrogen deficiency 04/22/2019   Sinus infection 07/21/2017   Anemia 11/05/2015   Health care maintenance 11/17/2014   Umbilical hernia 01/17/2013   Neoplasm of left breast, 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   GERD (gastroesophageal reflux disease) 07/22/2012   Diverticulosis 07/21/2012   Hypertension 07/21/2012   Hypercholesterolemia 07/21/2012   Abnormal liver function test 07/21/2012   Left carotid bruit 07/21/2012   Hyperglycemia 07/21/2012   Environmental allergies 07/21/2012   Lumbar disc disease 07/21/2012    ONSET DATE: Parkinson's Disease, diagnosed February, 2024  REFERRING DIAG: G20.A1 (ICD-10-CM) - Parkinson's disease without dyskinesia or fluctuating manifestations (HCC)   THERAPY DIAG:  Unsteadiness on feet  Abnormality of gait  Generalized weakness  Rationale for Evaluation and Treatment: Rehabilitation  SUBJECTIVE:                                                                                                                                                                                             SUBJECTIVE STATEMENT:   Patient reports her knees are still bothering her but not as bad as last session. Has been icing it. She went to the chiropractor the other day and they said her knees have a lot of fluid in them.    Pt accompanied by: self  PERTINENT HISTORY: Diagnosed with Parkinson's Disease February 2024, Hx of Back Pain s/p lumbar laminectomy November 2023, HTN  PAIN:  Are you having pain? No  PRECAUTIONS: Fall, Latex Allergy  RED FLAGS: None   WEIGHT BEARING RESTRICTIONS: No  FALLS: Has patient fallen in last 6 months? No  LIVING ENVIRONMENT: Lives with: lives with their spouse [name]) Lives  in:  House/apartment Stairs: Yes: Internal: flight steps; on left going up and External: 2 steps; no HR but pt will use a cane for support (does have a back entrance without steps) Pt reports she does have a chair lift to go up the flight of steps Has following equipment at home: Single point cane, Quad cane small base, [place] Grape - 4 wheeled, Wheelchair (manual), shower chair, Grab bars, and transport chair  PLOF: Independent  PATIENT GOALS: Walk without feeling clumsy, Come to stand with more ease  OBJECTIVE:  Note: Objective measures were completed at Evaluation unless otherwise noted.  DIAGNOSTIC FINDINGS: N/A  COGNITION: Overall cognitive status: Within functional limits for tasks assessed   SENSATION: WFL  COORDINATION: No significant bradykinesia noted with patient's movements  EDEMA:  None observed  MUSCLE TONE: Not formally assessed  MUSCLE LENGTH: Not formally assessed  DTRs:  Not formally assessed  POSTURE: rounded shoulders and forward head  LOWER EXTREMITY ROM:     Active   WFL throughout  Right Eval Left Eval  Hip flexion    Hip extension    Hip abduction    Hip adduction    Hip internal rotation    Hip external rotation    Knee flexion    Knee extension    Ankle dorsiflexion    Ankle plantarflexion    Ankle inversion    Ankle eversion     (Blank rows = not tested)  LOWER EXTREMITY MMT:    MMT Right Eval Left Eval  Hip flexion 4- 4  Hip extension    Hip abduction    Hip adduction    Hip internal rotation    Hip external rotation    Knee flexion 4 4+  Knee extension 4+ 4+  Ankle dorsiflexion 4 4  Ankle plantarflexion 4 4  Ankle inversion    Ankle eversion    (Blank rows = not tested)  Manual Muscle Test Scale 0/5 = No muscle contraction can be seen or felt 1/5 = Contraction can be felt, but there is no motion 2-/5 = Part moves through incomplete ROM w/ gravity decreased 2/5 = Part moves through complete ROM w/ gravity decreased 2+/5  = Part moves through incomplete ROM (<50%) against gravity or through complete ROM w/ gravity 3-/5 = Part moves through incomplete ROM (>50%) against gravity 3/5 = Part moves through complete ROM against gravity 3+/5 = Part moves through complete ROM against gravity/slight resistance 4-/5= Holds test position against slight to moderate pressure 4/5 = Part moves through complete ROM against gravity/moderate resistance 4+/5= Holds test position against moderate to strong pressure 5/5 = Part moves through complete ROM against gravity/full resistance  BED MOBILITY:  Sit to supine Modified independence - per pt report Supine to sit Modified independence - per pt report Pt reports minor difficulty rolling over in bed due to clothes getting caught on sheets  TRANSFERS: Assistive device utilized: None  Sit to stand: Modified independence Stand to sit: Modified independence Chair to chair: Modified independence Floor:  not assessed, would benefit from testing in future  RAMP:  Level of Assistance:  Assistive device utilized:    Ramp Comments: Not assessed, may benefit from testing in future  CURB:  Level of Assistance:    Assistive device utilized:    Curb Comments: Not assessed, would benefit from testing in future  STAIRS: Level of Assistance:    Stair Negotiation Technique: Forwards with Single Rail on Left Number of Stairs:   Height of Stairs: 6  inch  Comments: Not assessed, would benefit from testing in future  GAIT: Gait pattern: step through pattern, decreased arm swing- Right, decreased arm swing- Left, and narrow BOS Distance walked: ~335ft Assistive device utilized: None Level of assistance: SBA and CGA Comments:   FUNCTIONAL TESTS:  5 times sit to stand: 16.49 seconds with arms across chest 6 minute walk test: need to assess in next 1-2 visits 10 meter walk test: 0.827 m/s no AD Mini-Best Test:   between 11-13/28 (need to assess TUG vs TUG cognitive)  PATIENT  SURVEYS:  ABC scale 77.81%                                                                                                                              TREATMENT DATE: 11/11/23   Unless otherwise stated, CGA/SBA was provided and gait belt donned in order to ensure pt safety throughout session.  Pt ambulates in/out of therapy clinic, no AD, with slow, antalgic gait pattern due to continued knee pain/discomfort.  Octane level 4 x 6 minutes to improve reciprocal movement of BLE and BUE with goal of maintaining speed >30.   Sit to stands 2 x 10 with no UE support and GTB around knees to increase hip abductor activation   Fwd/bwd stepping over hurdle leading backwards with each LE x 10   Lateral step over hurdle x 10 each direction   Standing heel raises x 15 with light UE support Standing marching x 10 each LE with light UE support Semi tandem stance x 30 seconds leading with each LE  Standing 6" step taps with no UE support x 12 each LE     PATIENT EDUCATION: Education details: Findings on outcome measure, therapy POC, initial HEP Person educated: Patient Education method: Explanation Education comprehension: verbalized understanding and needs further education  HOME EXERCISE PROGRAM:  Access Code: [email or web address] URL: [email or web address] Date: 11/08/2023 Prepared by: Casimiro Needle  Exercises - Sit to Stand with Arms Crossed  - 1 x daily - 7 x weekly - 2 sets - 10 reps - Heel Raises with Counter Support  - 1 x daily - 7 x weekly - 2 sets - 15 reps - Standing 1/2 Tandem Stance with Eyes Closed  - 1 x daily - 7 x weekly - 2 sets - 30 seconds hold - Standing Romberg to 3/4 Tandem Stance  - 1 x daily - 7 x weekly - 2 sets - 30 seconds hold   GOALS: Goals reviewed with patient? Yes  SHORT TERM GOALS: Target date: 11/17/2023  Patient will be independent in home exercise program to improve strength/mobility for better functional independence with ADLs. Baseline:  initiated, but needs to be expanded upon 10/12/2023: printout provided Goal status: INITIAL   LONG TERM GOALS: Target date: 12/29/2023  1.  Patient (> 23 years old) will complete five times sit to stand test in < 15 seconds indicating an increased LE strength and improved balance. Baseline: 16.49 seconds  with arms across chest Goal status: INITIAL  2.  Patient will increase 10 meter walk test to >1.61m/s as to improve gait speed for better community ambulation and to reduce fall risk. Baseline:  0.827 m/s without AD Goal status: INITIAL  3. Patient will increase six minute walk test distance to >1000 for progression to community ambulator and improve gait ability Baseline: 10/12/2023: 791 feet (241 meters) Goal status: INITIAL  4. Patient will increase MiniBest Test score to >18/28 to indicate a reduced risk for falling and demonstrate increased independence with functional mobility and ADLs.  Baseline: 12/28  Goal status: INITIAL  5. Patient will increase ABC scale score >80% to demonstrate better functional mobility and better confidence with ADLs.   Baseline: 77.81%  Goal status: INITIAL   ASSESSMENT:  CLINICAL IMPRESSION:   Pt arrived highly motivated to participate in therapy session. Continues to complain of B knee pain but tolerates session well. Session focused on stepping over objects fwd/bwd/lateral as well as BLE strengthening with light UE support. Ms. Bianchini will benefit from further skilled PT to improve her functional deficits in order to increase QOL, ease/safety with ADLs, and promote safe community level mobility.   OBJECTIVE IMPAIRMENTS: Abnormal gait, decreased balance, decreased endurance, difficulty walking, and decreased strength.   ACTIVITY LIMITATIONS: carrying, lifting, bending, standing, squatting, sleeping, stairs, bed mobility, reach over head, and locomotion level  PARTICIPATION LIMITATIONS: cleaning, laundry, shopping, and community  activity  PERSONAL FACTORS: Age, Fitness, Sex, Time since onset of injury/illness/exacerbation, and 1-2 comorbidities: Parkinson's, HTN, hx of low back pain  are also affecting patient's functional outcome.   REHAB POTENTIAL: Good  CLINICAL DECISION MAKING: Evolving/moderate complexity  EVALUATION COMPLEXITY: Moderate  PLAN:  PT FREQUENCY: 1-2x/week  PT DURATION: 12 weeks  PLANNED INTERVENTIONS: 97164- PT Re-evaluation, 97110-Therapeutic exercises, 97530- Therapeutic activity, 97112- Neuromuscular re-education, 97535- Self Care, 73710- Manual therapy, [phone]- Gait training, [phone]- Canalith repositioning, Patient/Family education, Balance training, Stair training, Vestibular training, DME instructions, Cryotherapy, Moist heat, and Biofeedback  PLAN FOR NEXT SESSION:  - Octane machine (not Nustep) - follow-up on R knee pain -dynamic gait/ dual tasks  - stepping backwards over hurdle - midline orientation  - standing balance  - 1/2 tandem  - eyes closed  - posterior stepping retraining  Maylon Peppers, PT, DPT  Physical Therapist - [place]  [facility]  9:31 AM 11/11/23

## 2023-11-13 NOTE — Therapy (Signed)
 OUTPATIENT PHYSICAL THERAPY NEURO TREATMENT   Patient Name: Helen Marshall MRN: [id number] DOB:Mar 23, 1946, 78 y.o., female Today's Date: 11/14/2023   PCP: Dellar Fenton, MD REFERRING PROVIDER: Shirline Dover, DO   END OF SESSION:   PT End of Session - 11/14/23 0939     Visit Number 7    Number of Visits 24    Date for PT Re-Evaluation 12/29/23    PT Start Time 0937    PT Stop Time 1015    PT Time Calculation (min) 38 min    Equipment Utilized During Treatment Gait belt    Activity Tolerance Patient tolerated treatment well    Behavior During Therapy [facility] for tasks assessed/performed                 Past Medical History:  Diagnosis Date   Abnormal LFTs    Abnormal mammogram 12/19/2012   left   Allergy    Anemia    Arthritis    Cancer (HCC) 2014   left Breast   Carotid bruit    left   Cataract    Colon polyps    Diverticulosis 2012   Environmental allergies    Family history of adverse reaction to anesthesia    her aunt was hard to wake up   GERD (gastroesophageal reflux disease)    Heart murmur    History of diverticulitis 05/2021   Hypercholesterolemia    Hyperglycemia    Hypertension    IBS (irritable bowel syndrome)    Lipoma of colon    Lumbar disc disease    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   Pre-diabetes    Tubular adenoma of colon    Umbilical hernia 05/2021   Past Surgical History:  Procedure Laterality Date   ABDOMINAL HYSTERECTOMY  1989   fibroids   ANKLE SURGERY Left 1999   ANTERIOR AND POSTERIOR REPAIR WITH SACROSPINOUS FIXATION N/A 07/20/2021   Procedure: ANTERIOR AND REPAIR WITH SACROSPINOUS FIXATION;  Surgeon: Arma Lamp, MD;  Location: [facility];  Service: Gynecology;  Laterality:  N/A;   BREAST SURGERY Left 1970   biopsy   CATARACT EXTRACTION, BILATERAL  June & July 2017   CHOLECYSTECTOMY  2001   COLONOSCOPY  06/02/2011   Murleen Arms, MD; diverticulosis, submucosal lipoma of the sigmoid colon.   CYSTOSCOPY N/A 07/20/2021   Procedure: CYSTOSCOPY;  Surgeon: Arma Lamp, MD;  Location: [facility];  Service: Gynecology;  Laterality: N/A;   eyelid lift Bilateral    HERNIA REPAIR  2001, 2004   HERNIA REPAIR  01/22/2013   Repeat repair of umbilical defect, 2.5 cm. Primary repair.   INSERTION OF MESH N/A 03/31/2022   Procedure: INSERTION OF MESH;  Surgeon: Marshall Skeeter, MD;  Location: [facility];  Service: General;  Laterality: N/A;   LAPAROSCOPIC HYSTERECTOMY     LUMBAR LAMINECTOMY/DECOMPRESSION MICRODISCECTOMY Bilateral 06/18/2022   Procedure: Bilateral L3-4 Lumbar decompression;  Surgeon: Audie Bleacher, MD;  Location: [facility];  Service: Neurosurgery;  Laterality: Bilateral;   NASAL SINUS SURGERY  1997   rotator cuff surgery  1998   SQUAMOUS CELL CARCINOMA EXCISION Right 05/2013   shoulder   TUBAL LIGATION  1976   UPPER GI ENDOSCOPY  08/22/2014   Dr Kirk Peper. Bridgett Camps   VENTRAL HERNIA REPAIR N/A  03/31/2022   Procedure: HERNIA REPAIR VENTRAL ADULT;  Surgeon: Marshall Skeeter, MD;  Location: [facility];  Service: General;  Laterality: N/A;  Audie Leander, RNFA to assist TAPP block per anesthesia   Patient Active Problem List   Diagnosis Date Noted   Ear fullness, right 08/22/2023   Belching 07/09/2023   Breast tenderness 07/09/2023   Neuropathy 07/09/2023   Urinary tract infection 07/04/2023   Grief reaction with prolonged bereavement 07/04/2023   Parkinson's disease (HCC) 09/30/2022   Leukocytosis 09/28/2022   Lumbar stenosis with neurogenic claudication 06/18/2022   Dermatochalasis of both eyelids 06/12/2022   Squamous cell skin cancer 05/30/2022   Tremor 05/30/2022   History of breast cancer 09/29/2021   Pain around toenail 09/19/2021    Knee pain 09/19/2021   Lung nodule 04/26/2021   Back pain 07/23/2020   Tremor of both hands 03/16/2020   Vaginal prolapse 03/16/2020   Hand pain, left 06/06/2019   Osteopenia 05/27/2019   Estrogen deficiency 04/22/2019   Sinus infection 07/21/2017   Anemia 11/05/2015   Health care maintenance 11/17/2014   Umbilical hernia 01/17/2013   Neoplasm of left breast, 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   GERD (gastroesophageal reflux disease) 07/22/2012   Diverticulosis 07/21/2012   Hypertension 07/21/2012   Hypercholesterolemia 07/21/2012   Abnormal liver function test 07/21/2012   Left carotid bruit 07/21/2012   Hyperglycemia 07/21/2012   Environmental allergies 07/21/2012   Lumbar disc disease 07/21/2012    ONSET DATE: Parkinson's Disease, diagnosed February, 2024  REFERRING DIAG: G20.A1 (ICD-10-CM) - Parkinson's disease without dyskinesia or fluctuating manifestations (HCC)   THERAPY DIAG:  Unsteadiness on feet  Abnormality of gait  Generalized weakness  Rationale for Evaluation and Treatment: Rehabilitation  SUBJECTIVE:                                                                                                                                                                                             SUBJECTIVE STATEMENT:    Pt reports she is still experiencing some increased soreness in the B knees.  Pt states she probably needs to get an appointment for the knees at some point.  Pt accompanied by: self  PERTINENT HISTORY: Diagnosed with Parkinson's Disease February 2024, Hx of Back Pain s/p lumbar laminectomy November 2023, HTN  PAIN:  Are you having pain? No  PRECAUTIONS: Fall, Latex Allergy  RED FLAGS: None   WEIGHT BEARING RESTRICTIONS: No  FALLS: Has patient fallen in last 6 months? No  LIVING ENVIRONMENT: Lives with: lives with their spouse Dufm Gibbon) Lives in: House/apartment Stairs: Yes: Internal: flight  steps;  on left going up and External: 2 steps; no HR but pt will use a cane for support (does have a back entrance without steps) Pt reports she does have a chair lift to go up the flight of steps Has following equipment at home: Single point cane, Quad cane small base, Walker - 4 wheeled, Wheelchair (manual), shower chair, Grab bars, and transport chair  PLOF: Independent  PATIENT GOALS: Walk without feeling clumsy, Come to stand with more ease  OBJECTIVE:  Note: Objective measures were completed at Evaluation unless otherwise noted.  DIAGNOSTIC FINDINGS: N/A  COGNITION: Overall cognitive status: Within functional limits for tasks assessed   SENSATION: WFL  COORDINATION: No significant bradykinesia noted with patient's movements  EDEMA:  None observed  MUSCLE TONE: Not formally assessed  MUSCLE LENGTH: Not formally assessed  DTRs:  Not formally assessed  POSTURE: rounded shoulders and forward head  LOWER EXTREMITY ROM:     Active   WFL throughout  Right Eval Left Eval  Hip flexion    Hip extension    Hip abduction    Hip adduction    Hip internal rotation    Hip external rotation    Knee flexion    Knee extension    Ankle dorsiflexion    Ankle plantarflexion    Ankle inversion    Ankle eversion     (Blank rows = not tested)  LOWER EXTREMITY MMT:    MMT Right Eval Left Eval  Hip flexion 4- 4  Hip extension    Hip abduction    Hip adduction    Hip internal rotation    Hip external rotation    Knee flexion 4 4+  Knee extension 4+ 4+  Ankle dorsiflexion 4 4  Ankle plantarflexion 4 4  Ankle inversion    Ankle eversion    (Blank rows = not tested)  Manual Muscle Test Scale 0/5 = No muscle contraction can be seen or felt 1/5 = Contraction can be felt, but there is no motion 2-/5 = Part moves through incomplete ROM w/ gravity decreased 2/5 = Part moves through complete ROM w/ gravity decreased 2+/5 = Part moves through incomplete ROM (<50%)  against gravity or through complete ROM w/ gravity 3-/5 = Part moves through incomplete ROM (>50%) against gravity 3/5 = Part moves through complete ROM against gravity 3+/5 = Part moves through complete ROM against gravity/slight resistance 4-/5= Holds test position against slight to moderate pressure 4/5 = Part moves through complete ROM against gravity/moderate resistance 4+/5= Holds test position against moderate to strong pressure 5/5 = Part moves through complete ROM against gravity/full resistance  BED MOBILITY:  Sit to supine Modified independence - per pt report Supine to sit Modified independence - per pt report Pt reports minor difficulty rolling over in bed due to clothes getting caught on sheets  TRANSFERS: Assistive device utilized: None  Sit to stand: Modified independence Stand to sit: Modified independence Chair to chair: Modified independence Floor:  not assessed, would benefit from testing in future  RAMP:  Level of Assistance:  Assistive device utilized:    Ramp Comments: Not assessed, may benefit from testing in future  CURB:  Level of Assistance:    Assistive device utilized:    Curb Comments: Not assessed, would benefit from testing in future  STAIRS: Level of Assistance:    Stair Negotiation Technique: Forwards with Single Rail on Left Number of Stairs:   Height of Stairs: 6 inch  Comments: Not assessed, would benefit from  testing in future  GAIT: Gait pattern: step through pattern, decreased arm swing- Right, decreased arm swing- Left, and narrow BOS Distance walked: ~365ft Assistive device utilized: None Level of assistance: SBA and CGA Comments:   FUNCTIONAL TESTS:  5 times sit to stand: 16.49 seconds with arms across chest 6 minute walk test: need to assess in next 1-2 visits 10 meter walk test: 0.827 m/s no AD Mini-Best Test:   between 11-13/28 (need to assess TUG vs TUG cognitive)  PATIENT SURVEYS:  ABC scale 77.81%                                                                                                                               TREATMENT DATE: 11/14/23    Unless otherwise stated, CGA/SBA was provided and gait belt donned in order to ensure pt safety throughout session.  Pt ambulates in/out of therapy clinic, no AD, with slow, antalgic gait pattern due to continued knee pain/discomfort.  Octane level 4 x 6 minutes to improve reciprocal movement of BLE and BUE with goal of maintaining speed >30.   Sit to stands 2 x 10 with no UE support and airex pad under feet for increased difficulty from   Fwd/bwd stepping over hurdle leading backwards with each LE x 10   Fwd/bwd ambulation on airex balance beam, x8 each direction  Standing heel raises 2x15 with light UE support focusing on eccentric lowering Standing marching 2x10 each LE with light UE support Semi tandem stance x 30 seconds leading with each LE      PATIENT EDUCATION: Education details: Findings on outcome measure, therapy POC, initial HEP Person educated: Patient Education method: Explanation Education comprehension: verbalized understanding and needs further education  HOME EXERCISE PROGRAM:  Access Code: [email or web address] URL: [email or web address] Date: 11/08/2023 Prepared by: Carlen Chasten  Exercises - Sit to Stand with Arms Crossed  - 1 x daily - 7 x weekly - 2 sets - 10 reps - Heel Raises with Counter Support  - 1 x daily - 7 x weekly - 2 sets - 15 reps - Standing 1/2 Tandem Stance with Eyes Closed  - 1 x daily - 7 x weekly - 2 sets - 30 seconds hold - Standing Romberg to 3/4 Tandem Stance  - 1 x daily - 7 x weekly - 2 sets - 30 seconds hold   GOALS: Goals reviewed with patient? Yes  SHORT TERM GOALS: Target date: 11/17/2023  Patient will be independent in home exercise program to improve strength/mobility for better functional independence with ADLs. Baseline: initiated, but needs to be expanded upon 10/12/2023: printout  provided Goal status: INITIAL   LONG TERM GOALS: Target date: 12/29/2023  1.  Patient (> 48 years old) will complete five times sit to stand test in < 15 seconds indicating an increased LE strength and improved balance. Baseline: 16.49 seconds with arms across chest Goal status: INITIAL  2.  Patient will increase 10 meter walk test  to >1.42m/s as to improve gait speed for better community ambulation and to reduce fall risk. Baseline:  0.827 m/s without AD Goal status: INITIAL  3. Patient will increase six minute walk test distance to >1000 for progression to community ambulator and improve gait ability Baseline: 10/12/2023: 791 feet (241 meters) Goal status: INITIAL  4. Patient will increase MiniBest Test score to >18/28 to indicate a reduced risk for falling and demonstrate increased independence with functional mobility and ADLs.  Baseline: 12/28  Goal status: INITIAL  5. Patient will increase ABC scale score >80% to demonstrate better functional mobility and better confidence with ADLs.   Baseline: 77.81%  Goal status: INITIAL   ASSESSMENT:  CLINICAL IMPRESSION:    Pt responded well to the exercises today and was able to tolerate increased bouts of the existing exercises and the challenge of the airex pad placed under the feet when performing STS's.  Pt continues to have difficulty with retro stepping and increased step length, but otherwise is doing well.   Pt will continue to benefit from skilled therapy to address remaining deficits in order to improve overall QoL and return to PLOF.      OBJECTIVE IMPAIRMENTS: Abnormal gait, decreased balance, decreased endurance, difficulty walking, and decreased strength.   ACTIVITY LIMITATIONS: carrying, lifting, bending, standing, squatting, sleeping, stairs, bed mobility, reach over head, and locomotion level  PARTICIPATION LIMITATIONS: cleaning, laundry, shopping, and community activity  PERSONAL FACTORS: Age, Fitness, Sex, Time  since onset of injury/illness/exacerbation, and 1-2 comorbidities: Parkinson's, HTN, hx of low back pain  are also affecting patient's functional outcome.   REHAB POTENTIAL: Good  CLINICAL DECISION MAKING: Evolving/moderate complexity  EVALUATION COMPLEXITY: Moderate  PLAN:  PT FREQUENCY: 1-2x/week  PT DURATION: 12 weeks  PLANNED INTERVENTIONS: 97164- PT Re-evaluation, 97110-Therapeutic exercises, 97530- Therapeutic activity, 97112- Neuromuscular re-education, 97535- Self Care, 54098- Manual therapy, [phone]- Gait training, [phone]- Canalith repositioning, Patient/Family education, Balance training, Stair training, Vestibular training, DME instructions, Cryotherapy, Moist heat, and Biofeedback  PLAN FOR NEXT SESSION:  - Octane machine (not Nustep) - follow-up on R knee pain -dynamic gait/ dual tasks  - stepping backwards over hurdle - midline orientation  - standing balance  - 1/2 tandem  - eyes closed  - posterior stepping retraining  Rozanna Corner, PT, DPT Physical Therapist - [facility]  [facility]  11/14/23, 10:18 AM

## 2023-11-14 ENCOUNTER — Ambulatory Visit

## 2023-11-14 DIAGNOSIS — R269 Unspecified abnormalities of gait and mobility: Secondary | ICD-10-CM

## 2023-11-14 DIAGNOSIS — R2681 Unsteadiness on feet: Secondary | ICD-10-CM

## 2023-11-14 DIAGNOSIS — R531 Weakness: Secondary | ICD-10-CM

## 2023-11-15 ENCOUNTER — Ambulatory Visit

## 2023-11-16 ENCOUNTER — Ambulatory Visit: Admitting: Physical Therapy

## 2023-11-17 ENCOUNTER — Other Ambulatory Visit: Payer: Self-pay | Admitting: Internal Medicine

## 2023-11-17 ENCOUNTER — Ambulatory Visit: Admitting: Physical Therapy

## 2023-11-22 ENCOUNTER — Ambulatory Visit: Admitting: Physical Therapy

## 2023-11-22 ENCOUNTER — Encounter: Payer: Medicare Other | Admitting: Internal Medicine

## 2023-11-23 ENCOUNTER — Ambulatory Visit: Admitting: Physical Therapy

## 2023-11-23 DIAGNOSIS — R2681 Unsteadiness on feet: Secondary | ICD-10-CM

## 2023-11-23 DIAGNOSIS — R531 Weakness: Secondary | ICD-10-CM

## 2023-11-23 DIAGNOSIS — R269 Unspecified abnormalities of gait and mobility: Secondary | ICD-10-CM

## 2023-11-23 NOTE — Therapy (Signed)
 OUTPATIENT PHYSICAL THERAPY NEURO TREATMENT   Patient Name: Helen Marshall MRN: [id number] DOB:05/18/1946, 78 y.o., female Today's Date: 11/23/2023   PCP: Dellar Fenton, MD REFERRING PROVIDER: Tat, Von Grumbling, DO   END OF SESSION:   PT End of Session - 11/23/23 0931     Visit Number 8    Number of Visits 24    Date for PT Re-Evaluation 12/29/23    PT Start Time 0932    PT Stop Time 1015    PT Time Calculation (min) 43 min    Equipment Utilized During Treatment Gait belt    Activity Tolerance Patient tolerated treatment well    Behavior During Therapy [facility] for tasks assessed/performed                Past Medical History:  Diagnosis Date   Abnormal LFTs    Abnormal mammogram 12/19/2012   left   Allergy    Anemia    Arthritis    Cancer (HCC) 2014   left Breast   Carotid bruit    left   Cataract    Colon polyps    Diverticulosis 2012   Environmental allergies    Family history of adverse reaction to anesthesia    her aunt was hard to wake up   GERD (gastroesophageal reflux disease)    Heart murmur    History of diverticulitis 05/2021   Hypercholesterolemia    Hyperglycemia    Hypertension    IBS (irritable bowel syndrome)    Lipoma of colon    Lumbar disc disease    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   Pre-diabetes    Tubular adenoma of colon    Umbilical hernia 05/2021   Past Surgical History:  Procedure Laterality Date   ABDOMINAL HYSTERECTOMY  1989   fibroids   ANKLE SURGERY Left 1999   ANTERIOR AND POSTERIOR REPAIR WITH SACROSPINOUS FIXATION N/A 07/20/2021   Procedure: ANTERIOR AND REPAIR WITH SACROSPINOUS FIXATION;  Surgeon: Arma Lamp, MD;  Location: [facility];  Service: Gynecology;  Laterality: N/A;    BREAST SURGERY Left 1970   biopsy   CATARACT EXTRACTION, BILATERAL  June & July 2017   CHOLECYSTECTOMY  2001   COLONOSCOPY  06/02/2011   Murleen Arms, MD; diverticulosis, submucosal lipoma of the sigmoid colon.   CYSTOSCOPY N/A 07/20/2021   Procedure: CYSTOSCOPY;  Surgeon: Arma Lamp, MD;  Location: [facility];  Service: Gynecology;  Laterality: N/A;   eyelid lift Bilateral    HERNIA REPAIR  2001, 2004   HERNIA REPAIR  01/22/2013   Repeat repair of umbilical defect, 2.5 cm. Primary repair.   INSERTION OF MESH N/A 03/31/2022   Procedure: INSERTION OF MESH;  Surgeon: Marshall Skeeter, MD;  Location: [facility];  Service: General;  Laterality: N/A;   LAPAROSCOPIC HYSTERECTOMY     LUMBAR LAMINECTOMY/DECOMPRESSION MICRODISCECTOMY Bilateral 06/18/2022   Procedure: Bilateral L3-4 Lumbar decompression;  Surgeon: Audie Bleacher, MD;  Location: [facility] OR;  Service: Neurosurgery;  Laterality: Bilateral;   NASAL SINUS SURGERY  1997   rotator cuff surgery  1998   SQUAMOUS CELL CARCINOMA EXCISION Right 05/2013   shoulder   TUBAL LIGATION  1976   UPPER GI ENDOSCOPY  08/22/2014   Dr Kirk Peper. Bridgett Camps   VENTRAL HERNIA REPAIR N/A 03/31/2022  Procedure: HERNIA REPAIR VENTRAL ADULT;  Surgeon: Marshall Skeeter, MD;  Location: [facility];  Service: General;  Laterality: N/A;  Audie Leander, RNFA to assist TAPP block per anesthesia   Patient Active Problem List   Diagnosis Date Noted   Ear fullness, right 08/22/2023   Belching 07/09/2023   Breast tenderness 07/09/2023   Neuropathy 07/09/2023   Urinary tract infection 07/04/2023   Grief reaction with prolonged bereavement 07/04/2023   Parkinson's disease (HCC) 09/30/2022   Leukocytosis 09/28/2022   Lumbar stenosis with neurogenic claudication 06/18/2022   Dermatochalasis of both eyelids 06/12/2022   Squamous cell skin cancer 05/30/2022   Tremor 05/30/2022   History of breast cancer 09/29/2021   Pain around toenail 09/19/2021   Knee  pain 09/19/2021   Lung nodule 04/26/2021   Back pain 07/23/2020   Tremor of both hands 03/16/2020   Vaginal prolapse 03/16/2020   Hand pain, left 06/06/2019   Osteopenia 05/27/2019   Estrogen deficiency 04/22/2019   Sinus infection 07/21/2017   Anemia 11/05/2015   Health care maintenance 11/17/2014   Umbilical hernia 01/17/2013   Neoplasm of left breast, 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   GERD (gastroesophageal reflux disease) 07/22/2012   Diverticulosis 07/21/2012   Hypertension 07/21/2012   Hypercholesterolemia 07/21/2012   Abnormal liver function test 07/21/2012   Left carotid bruit 07/21/2012   Hyperglycemia 07/21/2012   Environmental allergies 07/21/2012   Lumbar disc disease 07/21/2012    ONSET DATE: Parkinson's Disease, diagnosed February, 2024  REFERRING DIAG: G20.A1 (ICD-10-CM) - Parkinson's disease without dyskinesia or fluctuating manifestations (HCC)   THERAPY DIAG:  Unsteadiness on feet  Abnormality of gait  Generalized weakness  Rationale for Evaluation and Treatment: Rehabilitation  SUBJECTIVE:                                                                                                                                                                                             SUBJECTIVE STATEMENT:  Pt reports feeling like her knee pain has progressively worsened over time. Pt states the pain started in her L knee, but is now bilateral. Reports the transition from sit>stand or stand>sit is what hurts the most. Reports when walking sometimes she will get a "catch" in her L knee, but that walking isn't as painful as transfers. Pain rated as 8/10 in bilateral knees to start session. States she has started putting a SPC next to her bed because she doesn't feel stable when she first gets up in the morning.  Reports she was taking some ibuprofen  for the pain, but did not take any today, but did  take 2 aspirin instead  of her normal 1 this morning to help with the pain. Reports intermittent use of ice for pain, but it does help.  Pt reports she has still been participating in her Parkinson's Boxing Class, but missed the past 2 classes because she went to the beach. Left for beach last Thursday 4/17 morning and came back yesterday.   Reports her low back has also been bothering her more. States she feels like "everything is hurting."   Reports she has a follow-up with Dr. Geralyn Knee next week.  Denies stumbles/falls since last session. Denies changes to medications.  Pt accompanied by: self  PERTINENT HISTORY: Diagnosed with Parkinson's Disease February 2024, Hx of Back Pain s/p lumbar laminectomy November 2023, HTN  PAIN:  Are you having pain? No  PRECAUTIONS: Fall, Latex Allergy  RED FLAGS: None   WEIGHT BEARING RESTRICTIONS: No  FALLS: Has patient fallen in last 6 months? No  LIVING ENVIRONMENT: Lives with: lives with their spouse Dufm Gibbon) Lives in: House/apartment Stairs: Yes: Internal: flight steps; on left going up and External: 2 steps; no HR but pt will use a cane for support (does have a back entrance without steps) Pt reports she does have a chair lift to go up the flight of steps Has following equipment at home: Single point cane, Quad cane small base, Walker - 4 wheeled, Wheelchair (manual), shower chair, Grab bars, and transport chair  PLOF: Independent  PATIENT GOALS: Walk without feeling clumsy, Come to stand with more ease  OBJECTIVE:  Note: Objective measures were completed at Evaluation unless otherwise noted.  DIAGNOSTIC FINDINGS: N/A  COGNITION: Overall cognitive status: Within functional limits for tasks assessed   SENSATION: WFL  COORDINATION: No significant bradykinesia noted with patient's movements  EDEMA:  None observed  MUSCLE TONE: Not formally assessed  MUSCLE LENGTH: Not formally assessed  DTRs:  Not formally assessed  POSTURE: rounded shoulders  and forward head  LOWER EXTREMITY ROM:     Active   WFL throughout  Right Eval Left Eval  Hip flexion    Hip extension    Hip abduction    Hip adduction    Hip internal rotation    Hip external rotation    Knee flexion    Knee extension    Ankle dorsiflexion    Ankle plantarflexion    Ankle inversion    Ankle eversion     (Blank rows = not tested)  LOWER EXTREMITY MMT:    MMT Right Eval Left Eval  Hip flexion 4- 4  Hip extension    Hip abduction    Hip adduction    Hip internal rotation    Hip external rotation    Knee flexion 4 4+  Knee extension 4+ 4+  Ankle dorsiflexion 4 4  Ankle plantarflexion 4 4  Ankle inversion    Ankle eversion    (Blank rows = not tested)  Manual Muscle Test Scale 0/5 = No muscle contraction can be seen or felt 1/5 = Contraction can be felt, but there is no motion 2-/5 = Part moves through incomplete ROM w/ gravity decreased 2/5 = Part moves through complete ROM w/ gravity decreased 2+/5 = Part moves through incomplete ROM (<50%) against gravity or through complete ROM w/ gravity 3-/5 = Part moves through incomplete ROM (>50%) against gravity 3/5 = Part moves through complete ROM against gravity 3+/5 = Part moves through complete ROM against gravity/slight resistance 4-/5= Holds test position against slight to moderate pressure 4/5 =  Part moves through complete ROM against gravity/moderate resistance 4+/5= Holds test position against moderate to strong pressure 5/5 = Part moves through complete ROM against gravity/full resistance  BED MOBILITY:  Sit to supine Modified independence - per pt report Supine to sit Modified independence - per pt report Pt reports minor difficulty rolling over in bed due to clothes getting caught on sheets  TRANSFERS: Assistive device utilized: None  Sit to stand: Modified independence Stand to sit: Modified independence Chair to chair: Modified independence Floor:  not assessed, would benefit  from testing in future  RAMP:  Level of Assistance:  Assistive device utilized:    Ramp Comments: Not assessed, may benefit from testing in future  CURB:  Level of Assistance:    Assistive device utilized:    Curb Comments: Not assessed, would benefit from testing in future  STAIRS: Level of Assistance:    Stair Negotiation Technique: Forwards with Single Rail on Left Number of Stairs:   Height of Stairs: 6 inch  Comments: Not assessed, would benefit from testing in future  GAIT: Gait pattern: step through pattern, decreased arm swing- Right, decreased arm swing- Left, and narrow BOS Distance walked: ~331ft Assistive device utilized: None Level of assistance: SBA and CGA Comments:   FUNCTIONAL TESTS:  5 times sit to stand: 16.49 seconds with arms across chest 6 minute walk test: need to assess in next 1-2 visits 10 meter walk test: 0.827 m/s no AD Mini-Best Test:   between 11-13/28 (need to assess TUG vs TUG cognitive)  PATIENT SURVEYS:  ABC scale 77.81%                                                                                                                              TREATMENT DATE: 11/23/23  Unless otherwise stated, CGA/SBA was provided and gait belt donned in order to ensure pt safety throughout session.  Pt ambulates in/out of therapy clinic, no AD, with slow, antalgic gait pattern due to continued knee pain/discomfort.  Educated pt to call MD to discuss which pain medication is most appropriate for her to take based her prescriptions and medical hx. Educated pt on recommendation to continue using ice for pain management.  Recommended patient call orthopedic MD to set-up an appointment to address her knee pain - she reports planning to do that today.   Discussed with pt and pt requests to hold further therapy at this time until she is able to see orthopedic MD to address her knee pain. Patient will plan to call when she is ready to return to therapy.  Plan  during session to modify pt's HEP to provide pt with exercises to work on until returning to therapy that do not cause increased knee pain.  Educated pt to discontinue the sit<>stand exercise on her HEP because this causes the greatest amount of pain. Also, educated pt to avoid the exercises in her Parkinson's exercise class that exacerbate her pain.  Performed the  below B LE strengthening exercises to update/modify pt's HEP to avoid exacerbation of knee pain:  - seated quad strengthening via LAQ 2x20reps  - pt denies increased pain with this and reports feeling her quad muscle activate, educated pt on ensuring she is doing this slow and controlled - seated hamstring curls against GTB resistance x15reps  -  pt with weakness in hamstrings compared to quads - standing heel raises x15 reps, denies pain and has good form using B UE support on balance bar - supine bridges x5 reps then added GTB resistance around knees for increased hip abductor activation x15 reps   Provided updated HEP printout below.    10 Meter Walk Test: Patient instructed to walk 10 meters (32.8 ft) as quickly and as safely as possible at their normal speed Results: 0.75 m/s (13.32 seconds and 13.08 seconds ) no AD with close supervision for safety - slow, antalgic gait pattern  Cut off scores:   Household Ambulator  < 0.4 m/s  Limited Community Ambulator  0.4 - 0.8 m/s  [employer]  > 0.8 m/s  Increased fall risk  < 1.21m/s  Crossing a Street  >1.4m/s  MCID 0.05 m/s (small), 0.13 m/s (moderate), 0.06 m/s (significant)  (ANPTA Core Set of Outcome Measures for Adults with Neurologic Conditions, 2018)    Planning to hold therapy at this time until pt sees MD for knee pain since it has progressively worsened and is limiting her ability to participate in functional mobility as well as physical therapy.    PATIENT EDUCATION: Education details: Findings on outcome measure, therapy POC, initial HEP Person  educated: Patient Education method: Explanation Education comprehension: verbalized understanding and needs further education  HOME EXERCISE PROGRAM:  Access Code: [email or web address] URL: [email or web address] Date: 11/23/2023 Prepared by: Carlen Chasten  Exercises - Seated Long Arc Quad with Ankle Weight  - 1 x daily - 7 x weekly - 2 sets - 20 reps - Seated Hamstring Curl with Anchored Resistance  - 1 x daily - 7 x weekly - 2 sets - 10 reps - Heel Raises with Counter Support  - 1 x daily - 7 x weekly - 2 sets - 15 reps - Supine Bridge with Resistance Band  - 1 x daily - 7 x weekly - 2 sets - 10 reps - Standing 1/2 Tandem Stance with Eyes Closed  - 1 x daily - 7 x weekly - 2 sets - 30 seconds hold - Standing Romberg to 3/4 Tandem Stance  - 1 x daily - 7 x weekly - 2 sets - 30 seconds hold   GOALS: Goals reviewed with patient? Yes  SHORT TERM GOALS: Target date: 11/17/2023  Patient will be independent in home exercise program to improve strength/mobility for better functional independence with ADLs. Baseline: initiated, but needs to be expanded upon 10/12/2023: printout provided Goal status: INITIAL   LONG TERM GOALS: Target date: 12/29/2023  1.  Patient (> 56 years old) will complete five times sit to stand test in < 15 seconds indicating an increased LE strength and improved balance. Baseline: 16.49 seconds with arms across chest Goal status: INITIAL  2.  Patient will increase 10 meter walk test to >1.83m/s as to improve gait speed for better community ambulation and to reduce fall risk. Baseline:  0.827 m/s without AD 11/23/2023: 0.75 m/s no AD with supervision Goal status: IN PROGRESS  3. Patient will increase six minute walk test distance to >1000 for progression to community ambulator and improve gait  ability Baseline: 10/12/2023: 791 feet (241 meters) Goal status: INITIAL  4. Patient will increase MiniBest Test score to >18/28 to indicate a reduced risk for falling and  demonstrate increased independence with functional mobility and ADLs.  Baseline: 12/28  Goal status: INITIAL  5. Patient will increase ABC scale score >80% to demonstrate better functional mobility and better confidence with ADLs.   Baseline: 77.81%  Goal status: INITIAL   ASSESSMENT:  CLINICAL IMPRESSION:   Patient arrived to therapy session continuing to report progressively worsening knee pain that started in L knee and is now bilateral. Discussed with patient and she plans to set-up appointment with orthopedic MD and would like to hold therapy at this time. Therapy session focused on providing updated HEP that pt can perform to maintain strength in B LEs without exacerbating pain until she is able to return to therapy. Re-assessed patient's and noticed decreased gait speed due to pt having increased knee pain with antalgic gait pattern. Plan to ho.ld therapy at this time and pt will return once knee pain is addressed.   OBJECTIVE IMPAIRMENTS: Abnormal gait, decreased balance, decreased endurance, difficulty walking, and decreased strength.   ACTIVITY LIMITATIONS: carrying, lifting, bending, standing, squatting, sleeping, stairs, bed mobility, reach over head, and locomotion level  PARTICIPATION LIMITATIONS: cleaning, laundry, shopping, and community activity  PERSONAL FACTORS: Age, Fitness, Sex, Time since onset of injury/illness/exacerbation, and 1-2 comorbidities: Parkinson's, HTN, hx of low back pain  are also affecting patient's functional outcome.   REHAB POTENTIAL: Good  CLINICAL DECISION MAKING: Evolving/moderate complexity  EVALUATION COMPLEXITY: Moderate  PLAN:  PT FREQUENCY: 1-2x/week  PT DURATION: 12 weeks  PLANNED INTERVENTIONS: 97164- PT Re-evaluation, 97110-Therapeutic exercises, 97530- Therapeutic activity, 97112- Neuromuscular re-education, 97535- Self Care, 78469- Manual therapy, [phone]- Gait training, [phone]- Canalith repositioning, Patient/Family  education, Balance training, Stair training, Vestibular training, DME instructions, Cryotherapy, Moist heat, and Biofeedback  PLAN FOR NEXT SESSION:   **Hold therapy until after knee pain addressed **  Once patient returns: - Engineer, drilling (not Nustep) - follow-up on R knee pain -dynamic gait/ dual tasks  - stepping backwards over hurdle - midline orientation  - standing balance  - 1/2 tandem  - eyes closed  - posterior stepping retraining   Colonel Krauser, PT, DPT, NCS, CSRS Physical Therapist - [place]  [facility]  11:34 AM 11/23/23

## 2023-11-25 ENCOUNTER — Other Ambulatory Visit (INDEPENDENT_AMBULATORY_CARE_PROVIDER_SITE_OTHER): Payer: Medicare Other

## 2023-11-25 DIAGNOSIS — I10 Essential (primary) hypertension: Secondary | ICD-10-CM | POA: Diagnosis not present

## 2023-11-25 DIAGNOSIS — E7800 Pure hypercholesterolemia, unspecified: Secondary | ICD-10-CM

## 2023-11-25 DIAGNOSIS — R739 Hyperglycemia, unspecified: Secondary | ICD-10-CM

## 2023-11-25 LAB — LIPID PANEL
Cholesterol: 222 mg/dL — ABNORMAL HIGH (ref 0–200)
HDL: 79.6 mg/dL
LDL Cholesterol: 124 mg/dL — ABNORMAL HIGH (ref 0–99)
NonHDL: 142.2
Total CHOL/HDL Ratio: 3
Triglycerides: 89 mg/dL (ref 0.0–149.0)
VLDL: 17.8 mg/dL (ref 0.0–40.0)

## 2023-11-25 LAB — HEPATIC FUNCTION PANEL
ALT: 6 U/L (ref 0–35)
AST: 22 U/L (ref 0–37)
Albumin: 4.2 g/dL (ref 3.5–5.2)
Alkaline Phosphatase: 85 U/L (ref 39–117)
Bilirubin, Direct: 0.1 mg/dL (ref 0.0–0.3)
Total Bilirubin: 0.5 mg/dL (ref 0.2–1.2)
Total Protein: 6.5 g/dL (ref 6.0–8.3)

## 2023-11-25 LAB — BASIC METABOLIC PANEL WITH GFR
BUN: 13 mg/dL (ref 6–23)
CO2: 31 meq/L (ref 19–32)
Calcium: 9.2 mg/dL (ref 8.4–10.5)
Chloride: 104 meq/L (ref 96–112)
Creatinine, Ser: 0.72 mg/dL (ref 0.40–1.20)
GFR: 80.13 mL/min
Glucose, Bld: 114 mg/dL — ABNORMAL HIGH (ref 70–99)
Potassium: 3.6 meq/L (ref 3.5–5.1)
Sodium: 142 meq/L (ref 135–145)

## 2023-11-25 LAB — HEMOGLOBIN A1C: Hgb A1c MFr Bld: 5.9 % (ref 4.6–6.5)

## 2023-11-29 ENCOUNTER — Ambulatory Visit: Admitting: Physical Therapy

## 2023-11-29 ENCOUNTER — Ambulatory Visit (INDEPENDENT_AMBULATORY_CARE_PROVIDER_SITE_OTHER): Payer: Medicare Other | Admitting: Internal Medicine

## 2023-11-29 VITALS — BP 118/64 | HR 67 | Temp 97.3°F | Resp 16 | Ht 62.75 in | Wt 168.4 lb

## 2023-11-29 DIAGNOSIS — E2839 Other primary ovarian failure: Secondary | ICD-10-CM

## 2023-11-29 DIAGNOSIS — E7800 Pure hypercholesterolemia, unspecified: Secondary | ICD-10-CM | POA: Diagnosis not present

## 2023-11-29 DIAGNOSIS — R911 Solitary pulmonary nodule: Secondary | ICD-10-CM

## 2023-11-29 DIAGNOSIS — R739 Hyperglycemia, unspecified: Secondary | ICD-10-CM | POA: Diagnosis not present

## 2023-11-29 DIAGNOSIS — Z0000 Encounter for general adult medical examination without abnormal findings: Secondary | ICD-10-CM

## 2023-11-29 DIAGNOSIS — I10 Essential (primary) hypertension: Secondary | ICD-10-CM | POA: Diagnosis not present

## 2023-11-29 DIAGNOSIS — D72829 Elevated white blood cell count, unspecified: Secondary | ICD-10-CM

## 2023-11-29 DIAGNOSIS — M25569 Pain in unspecified knee: Secondary | ICD-10-CM

## 2023-11-29 DIAGNOSIS — G20A1 Parkinson's disease without dyskinesia, without mention of fluctuations: Secondary | ICD-10-CM

## 2023-11-29 DIAGNOSIS — Z1231 Encounter for screening mammogram for malignant neoplasm of breast: Secondary | ICD-10-CM

## 2023-11-29 MED ORDER — BENAZEPRIL HCL 40 MG PO TABS: 40.0000 mg | ORAL_TABLET | Freq: Every day | ORAL | 3 refills | Status: AC

## 2023-11-29 NOTE — Progress Notes (Signed)
 Subjective:    Patient ID: Helen Marshall, female    DOB: 03-22-1946, 78 y.o.   MRN: [id number]  Patient here for  Chief Complaint  Patient presents with   Annual Exam    HPI Here for a physical exam. Saw pulmonary 10/24/23 - f/u lung nodule (RLL 4mm nodule). Stable for two years. Recommended f/u chest CT in one year. Has been working with PT for gait and balance issues. Had f/u with Dr Tat 09/15/23 - recommended to continue sinemet  tid. Has been having issues with bilateral knee pain. PT on hold. Has appt to have her knees evaluated 01/24/24. Request earlier appt. Had started tumeric. Off now. Felt affected her bowels. Has tried exercise. No chest pain. Breathing stable. Discussed bone density.     Past Medical History:  Diagnosis Date   Abnormal LFTs    Abnormal mammogram 12/19/2012   left   Allergy    Anemia    Arthritis    Cancer [facility]) 2014   left Breast   Carotid bruit    left   Cataract    Colon polyps    Diverticulosis 2012   Environmental allergies    Family history of adverse reaction to anesthesia    her aunt was hard to wake up   GERD (gastroesophageal reflux disease)    Heart murmur    History of diverticulitis 05/2021   Hypercholesterolemia    Hyperglycemia    Hypertension    IBS (irritable bowel syndrome)    Lipoma of colon    Lumbar disc disease    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   Pre-diabetes    Tubular adenoma of colon    Umbilical hernia 05/2021   Past Surgical History:  Procedure Laterality Date   ABDOMINAL HYSTERECTOMY  1989   fibroids   ANKLE SURGERY Left 1999   ANTERIOR AND POSTERIOR REPAIR WITH SACROSPINOUS FIXATION N/A 07/20/2021   Procedure: ANTERIOR AND REPAIR WITH SACROSPINOUS FIXATION;  Surgeon: Arma Lamp,  MD;  Location: [facility];  Service: Gynecology;  Laterality: N/A;   BREAST SURGERY Left 1970   biopsy   CATARACT EXTRACTION, BILATERAL  June & July 2017   CHOLECYSTECTOMY  2001   COLONOSCOPY  06/02/2011   Murleen Arms, MD; diverticulosis, submucosal lipoma of the sigmoid colon.   CYSTOSCOPY N/A 07/20/2021   Procedure: CYSTOSCOPY;  Surgeon: Arma Lamp, MD;  Location: [facility];  Service: Gynecology;  Laterality: N/A;   eyelid lift Bilateral    HERNIA REPAIR  2001, 2004   HERNIA REPAIR  01/22/2013   Repeat repair of umbilical defect, 2.5 cm. Primary repair.   INSERTION OF MESH N/A 03/31/2022   Procedure: INSERTION OF MESH;  Surgeon: Marshall Skeeter, MD;  Location: [facility];  Service: General;  Laterality: N/A;   LAPAROSCOPIC HYSTERECTOMY     LUMBAR LAMINECTOMY/DECOMPRESSION MICRODISCECTOMY Bilateral 06/18/2022   Procedure: Bilateral L3-4 Lumbar decompression;  Surgeon: Audie Bleacher, MD;  Location: [facility] OR;  Service: Neurosurgery;  Laterality: Bilateral;   NASAL SINUS SURGERY  1997   rotator cuff surgery  1998   SQUAMOUS CELL CARCINOMA EXCISION Right 05/2013   shoulder   TUBAL LIGATION  1976   UPPER GI ENDOSCOPY  08/22/2014   Dr Kirk Peper. Bridgett Camps   VENTRAL HERNIA REPAIR N/A 03/31/2022  Procedure: HERNIA REPAIR VENTRAL ADULT;  Surgeon: Marshall Skeeter, MD;  Location: [facility];  Service: General;  Laterality: N/A;  Audie Leander, RNFA to assist TAPP block per anesthesia   Family History  Problem Relation Age of Onset   Heart disease Mother        myocardial infarction   Diabetes Mother    Hearing loss Mother    CVA Father    Alcohol  abuse Father    Arthritis/Rheumatoid Sister        x2   Breast cancer Sister 90   Arthritis Sister    Hearing loss Sister    Arthritis Sister    Hearing loss Sister    Asthma Sister    Arthritis Sister    Hearing loss Sister    Hypothyroidism Sister    Arthritis Sister    Hearing loss Sister    Colon  polyps Sister    Arthritis Sister    Hearing loss Sister    Hypertension Brother        x2   Arthritis Brother    Hearing loss Brother    Alcohol  abuse Brother    Arthritis Brother    Hearing loss Brother    Colon polyps Son    Alcohol  abuse Son    Colon cancer Maternal Uncle    Cancer Other        breast - paternal first cousin   Stroke Daughter    Social History   Socioeconomic History   Marital status: Married    Spouse name: ,Dufm Gibbon   Number of children: 2   Years of education: Not on file   Highest education level: Not on file  Occupational History   Occupation: Retired    Comment: Designer, 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  Tobacco Use   Smoking status: Never   Smokeless tobacco: Never  Vaping Use   Vaping status: Never Used  Substance and Sexual Activity   Alcohol  use: No    Alcohol /week: 0.0 standard drinks of alcohol    Drug use: No   Sexual activity: Not Currently  Other Topics Concern   Not on file  Social History Narrative   Right handed   Retired    Chief Executive Officer Drivers of Corporate investment banker Strain: Low Risk  (11/30/2023)   Received from [facility]   Overall Financial Resource Strain (CARDIA)    Difficulty of Paying Living Expenses: Not hard at all  Food Insecurity: No Food Insecurity (11/30/2023)   Received from [facility]   Hunger Vital Sign    Worried About Running Out of Food in the Last Year: Never true    Ran Out of Food in the Last Year: Never true  Transportation Needs: No Transportation Needs (11/30/2023)   Received from [facility] - Transportation    In the past 12 months, has lack of transportation kept you from medical appointments or from getting medications?: No    Lack of Transportation (Non-Medical): No  Physical Activity: Sufficiently Active (02/15/2023)   Exercise Vital Sign    Days of Exercise per Week: 3 days    Minutes of Exercise per Session: 60 min  Stress: No Stress Concern  Present (02/15/2023)   [employer] of Occupational Health - Occupational Stress Questionnaire    Feeling of Stress : Not at all  Social Connections: Socially Integrated (02/15/2023)   Social Connection and Isolation Panel [NHANES]    Frequency of Communication with Friends and Family:  More than three times a week    Frequency of Social Gatherings with Friends and Family: Once a week    Attends Religious Services: More than 4 times per year    Active Member of [employer] or Organizations: Yes    Attends Engineer, structural: More than 4 times per year    Marital Status: Married     Review of Systems  Constitutional:  Negative for appetite change and unexpected weight change.  HENT:  Negative for congestion, sinus pressure and sore throat.   Eyes:  Negative for pain and visual disturbance.  Respiratory:  Negative for cough, chest tightness and shortness of breath.   Cardiovascular:  Negative for chest pain, palpitations and leg swelling.  Gastrointestinal:  Negative for abdominal pain, diarrhea, nausea and vomiting.  Genitourinary:  Negative for difficulty urinating and dysuria.  Musculoskeletal:  Negative for myalgias.       Knee pain as outlined - bilateral.   Skin:  Negative for color change and rash.  Neurological:  Negative for dizziness and headaches.  Hematological:  Negative for adenopathy. Does not bruise/bleed easily.  Psychiatric/Behavioral:  Negative for agitation and dysphoric mood.        Objective:     BP 118/64   Pulse 67   Temp (!) 97.3 F (36.3 C)   Resp 16   Ht 5' 2.75" (1.594 m)   Wt 168 lb 6.4 oz (76.4 kg)   LMP 07/21/1988   SpO2 99%   BMI 30.07 kg/m  Wt Readings from Last 3 Encounters:  11/29/23 168 lb 6.4 oz (76.4 kg)  10/24/23 166 lb 12.8 oz (75.7 kg)  09/15/23 166 lb 9.6 oz (75.6 kg)    Physical Exam Vitals reviewed.  Constitutional:      General: She is not in acute distress.    Appearance: Normal appearance. She is  well-developed.  HENT:     Head: Normocephalic and atraumatic.     Right Ear: External ear normal.     Left Ear: External ear normal.     Mouth/Throat:     Pharynx: No oropharyngeal exudate or posterior oropharyngeal erythema.  Eyes:     General: No scleral icterus.       Right eye: No discharge.        Left eye: No discharge.     Conjunctiva/sclera: Conjunctivae normal.  Neck:     Thyroid : No thyromegaly.  Cardiovascular:     Rate and Rhythm: Normal rate and regular rhythm.  Pulmonary:     Effort: No tachypnea, accessory muscle usage or respiratory distress.     Breath sounds: Normal breath sounds. No decreased breath sounds or wheezing.  Chest:  Breasts:    Right: No inverted nipple, mass, nipple discharge or tenderness (no axillary adenopathy).     Left: No inverted nipple, mass, nipple discharge or tenderness (no axilarry adenopathy).  Abdominal:     General: Bowel sounds are normal.     Palpations: Abdomen is soft.     Tenderness: There is no abdominal tenderness.  Musculoskeletal:        General: No swelling or tenderness.     Cervical back: Neck supple.  Lymphadenopathy:     Cervical: No cervical adenopathy.  Skin:    Findings: No erythema or rash.  Neurological:     Mental Status: She is alert and oriented to person, place, and time.  Psychiatric:        Mood and Affect: Mood normal.  Behavior: Behavior normal.         Outpatient Encounter Medications as of 11/29/2023  Medication Sig   albuterol  (VENTOLIN  HFA) 108 (90 Base) MCG/ACT inhaler INHALE 2 PUFFS EVERY 6 HOURS AS NEEDED FOR WHEEZING OR SHORTNESS OF BREATH   amLODipine  (NORVASC ) 5 MG tablet TAKE 1 TABLET BY MOUTH DAILY   aspirin EC 325 MG tablet Take 325 mg by mouth daily.   benazepril  (LOTENSIN ) 40 MG tablet Take 1 tablet (40 mg total) by mouth daily.   Calcium  Carb-Cholecalciferol  (CALCIUM  600 + D PO) Take 1 tablet by mouth daily.   carbidopa -levodopa  (SINEMET  IR) 25-100 MG tablet Take 1  tablet by mouth 3 (three) times daily. 8am/noon/4pm   ezetimibe  (ZETIA ) 10 MG tablet Take 1 tablet (10 mg total) by mouth daily.   fish oil-omega-3 fatty acids 1000 MG capsule Take 2 g by mouth 2 (two) times daily.   fluticasone  (FLONASE ) 50 MCG/ACT nasal spray PLACE 2 SPRAYS INTO BOTH NOSTRILS AS NEEDED.   ibuprofen  (ADVIL ) 200 MG tablet Take 400 mg by mouth every 6 (six) hours as needed for moderate pain.   lidocaine  (LIDODERM ) 5 % Place 1 patch onto the skin daily as needed (pain). Remove & Discard patch within 12 hours or as directed by MD   loratadine  (CLARITIN ) 10 MG tablet Take 10 mg by mouth daily as needed for allergies.   Multiple Vitamin (MULTIVITAMIN) tablet Take 1 tablet by mouth daily.   niacin (VITAMIN B3) 500 MG tablet Take 1,000 mg by mouth 2 (two) times daily.   ondansetron  (ZOFRAN -ODT) 4 MG disintegrating tablet Take 1 tablet (4 mg total) by mouth every 8 (eight) hours as needed for nausea or vomiting.   Povidone, PF, (IVIZIA DRY EYES) 0.5 % SOLN Place 1 drop into both eyes 2 (two) times daily.   Probiotic Product (VSL#3 PO) Take 1 capsule by mouth daily.   sertraline  (ZOLOFT ) 25 MG tablet Take 1 tablet (25 mg total) by mouth daily.   Simethicone (GAS-X PO) Take 2 tablets by mouth daily as needed (gas).   triamcinolone  cream (KENALOG ) 0.1 % Apply 1 Application topically as needed (psoriasis).   [DISCONTINUED] amoxicillin -clavulanate (AUGMENTIN ) 875-125 MG tablet Take 1 tablet by mouth 2 (two) times daily. (Patient not taking: Reported on 10/24/2023)   [DISCONTINUED] benazepril  (LOTENSIN ) 40 MG tablet Take 1 tablet (40 mg total) by mouth daily.   No facility-administered encounter medications on file as of 11/29/2023.     Lab Results  Component Value Date   WBC 5.2 02/25/2023   HGB 13.3 02/25/2023   HCT 40.8 02/25/2023   PLT 216.0 02/25/2023   GLUCOSE 114 (H) 11/25/2023   CHOL 222 (H) 11/25/2023   TRIG 89.0 11/25/2023   HDL 79.60 11/25/2023   LDLDIRECT 101.4 05/07/2013    LDLCALC 124 (H) 11/25/2023   ALT 6 11/25/2023   AST 22 11/25/2023   NA 142 11/25/2023   K 3.6 11/25/2023   CL 104 11/25/2023   CREATININE 0.72 11/25/2023   BUN 13 11/25/2023   CO2 31 11/25/2023   TSH 1.38 07/04/2023   HGBA1C 5.9 11/25/2023   MICROALBUR <0.7 04/19/2018    CT Chest Wo Contrast Result Date: 09/12/2023 CLINICAL DATA:  Abnormal x-ray, lung nodule. EXAM: CT CHEST WITHOUT CONTRAST TECHNIQUE: Multidetector CT imaging of the chest was performed following the standard protocol without IV contrast. RADIATION DOSE REDUCTION: This exam was performed according to the departmental dose-optimization program which includes automated exposure control, adjustment of the mA and/or kV according  to patient size and/or use of iterative reconstruction technique. COMPARISON:  10/02/2021 FINDINGS: Cardiovascular: Calcification at the aortic valve. Scattered atheromatous calcification of the aorta and great vessels. Normal heart size. No pericardial effusion. Mediastinum/Nodes: Negative for mass or adenopathy. Unremarkable esophagus. Lungs/Pleura: Band of subpleural opacification in the right lower lobe adjacent to thoracic osteophytes, attributed to scarring. Mild cylindrical bronchiectasis and subpleural reticulation at the bases. No honeycombing. Unchanged 4-5 mm average diameter nodule in the right lower lobe which is ovoid in shape. There is an additional peribronchovascular nodule measuring 4 mm in average diameter on 3:90. No new nodule. Upper Abdomen: Negative Musculoskeletal: Ordinary thoracic spondylosis. No acute or aggressive finding. Remote and healed mid sternal fracture. IMPRESSION: 1. Compared to 2023, no new or growing pulmonary nodule. Previously described nodule is considered benign given nearly 2 year stability. 2. Mild subpleural reticulation and borderline bronchiectasis, no progression or honeycombing. Electronically Signed   By: Ronnette Coke M.D.   On: 09/12/2023 09:15        Assessment & Plan:  Routine general medical examination at a health care facility  Hypercholesterolemia Assessment & Plan: The 10-year ASCVD risk score (Arnett DK, et al., 2019) is: 35%   Values used to calculate the score:     Age: 31 years     Sex: Female     Is Non-Hispanic African American: No     Diabetic: No     Tobacco smoker: No     Systolic Blood Pressure: 142 mmHg     Is BP treated: Yes     HDL Cholesterol: 79.6 mg/dL     Total Cholesterol: 222 mg/dL  Intolerant to statin.  On zetia . Continue diet and exercise.   Orders: -     Lipid panel; Future -     Hepatic function panel; Future  Primary hypertension Assessment & Plan: Continue amlodipine , benazepril .  Blood pressure has been under good control on this regimen.  Follow pressures.  Follow metabolic panel.   Orders: -     Basic metabolic panel with GFR; Future  Hyperglycemia Assessment & Plan: Low carb diet and exercise. Follow met b and A1c.   Orders: -     Hemoglobin A1c; Future  Leukocytosis, unspecified type -     CBC with Differential/Platelet; Future  Health care maintenance Assessment & Plan: Physical today 11/29/23.  Mammogram 01/05/23 - Birads I.  Had right diagnostic mammogram - ok. Due f/u mammogram in 01/2024. Colonoscopy 06/29/21 - One 4 mm polyp in the cecum. One 2 mm polyp in the ascending colon. (polyp in the cecum). (tubular adenomas).  Moderate diverticulosis in the sigmoid colon. The distal rectum and anal verge are normal on retroflexion view.   Encounter for screening mammogram for malignant neoplasm of breast -     3D Screening Mammogram, Left and Right; Future  Estrogen deficiency Assessment & Plan: Scheduled for bone density.   Orders: -     DG Bone Density; Future  Lung nodule Assessment & Plan:  Incidental finding CT abdomen 04/26/21.  Had f/u chest CT 10/02/21 - stable 4mm right lower lobe pulmonary nodule.  F/u pulmonary 10/2023 - recommended f/u CT in one year.     Parkinson's disease, unspecified whether dyskinesia present, unspecified whether manifestations fluctuate [facility]) Assessment & Plan: Dr Tat 09/2022 - diagnosed.  Started sinemet . Doing well. Helping.  Tremor better.  Boxing.  Continue f/u with Dr Tat.    Knee pain, unspecified chronicity, unspecified laterality Assessment & Plan: Knee pain as outlined.  Therapy on hold. Appt scheduled with ortho given persistence despite exercise.    Other orders -     Benazepril  HCl; Take 1 tablet (40 mg total) by mouth daily.  Dispense: 90 tablet; Refill: 3     Dellar Fenton, MD

## 2023-11-29 NOTE — Assessment & Plan Note (Addendum)
 Physical today 11/29/23.  Mammogram 01/05/23 - Birads I.  Had right diagnostic mammogram - ok. Due f/u mammogram in 01/2024. Colonoscopy 06/29/21 - One 4 mm polyp in the cecum. One 2 mm polyp in the ascending colon. (polyp in the cecum). (tubular adenomas).  Moderate diverticulosis in the sigmoid colon. The distal rectum and anal verge are normal on retroflexion view.

## 2023-11-29 NOTE — Patient Instructions (Signed)
 Appt tomorrow 11/30/23 - 10:30 am - Helen Marshall - [place] [facility] clinic)

## 2023-12-04 ENCOUNTER — Telehealth: Payer: Self-pay | Admitting: Internal Medicine

## 2023-12-04 ENCOUNTER — Encounter: Payer: Self-pay | Admitting: Internal Medicine

## 2023-12-04 NOTE — Assessment & Plan Note (Signed)
 The 10-year ASCVD risk score (Arnett DK, et al., 2019) is: 35%   Values used to calculate the score:     Age: 78 years     Sex: Female     Is Non-Hispanic African American: No     Diabetic: No     Tobacco smoker: No     Systolic Blood Pressure: 142 mmHg     Is BP treated: Yes     HDL Cholesterol: 79.6 mg/dL     Total Cholesterol: 222 mg/dL  Intolerant to statin.  On zetia . Continue diet and exercise.

## 2023-12-04 NOTE — Assessment & Plan Note (Signed)
 Incidental finding CT abdomen 04/26/21.  Had f/u chest CT 10/02/21 - stable 4mm right lower lobe pulmonary nodule.  F/u pulmonary 10/2023 - recommended f/u CT in one year.

## 2023-12-04 NOTE — Assessment & Plan Note (Signed)
Continue amlodipine, benazepril.  Blood pressure has been under good control on this regimen.  Follow pressures.  Follow metabolic panel.   

## 2023-12-04 NOTE — Telephone Encounter (Signed)
 Per review of chart,she had a screening mammogram in 01/2023 and right breast 09/2023. Should be due mammogram in June. I reordered. The previous order was canceled during her visit. She is scheduled for a bone density. Please schedule the screening mammogram.

## 2023-12-04 NOTE — Assessment & Plan Note (Signed)
 Dr Tat 09/2022 - diagnosed.  Started sinemet . Doing well. Helping.  Tremor better.  Boxing.  Continue f/u with Dr Tat.

## 2023-12-04 NOTE — Assessment & Plan Note (Signed)
Scheduled for bone density.

## 2023-12-04 NOTE — Assessment & Plan Note (Signed)
 Low-carb diet and exercise.  Follow met b and A1c.

## 2023-12-04 NOTE — Assessment & Plan Note (Signed)
 Knee pain as outlined. Therapy on hold. Appt scheduled with ortho given persistence despite exercise.

## 2023-12-05 ENCOUNTER — Ambulatory Visit: Admitting: Physical Therapy

## 2023-12-08 ENCOUNTER — Ambulatory Visit: Admitting: Physical Therapy

## 2023-12-12 ENCOUNTER — Ambulatory Visit: Admitting: Physical Therapy

## 2023-12-13 NOTE — Telephone Encounter (Signed)
Spoke with patient, she will follow up with mammogram at outside location and follow up with provider closer to home. Appointments cancelled per patient request, will send mammogram orders via mail.

## 2023-12-13 NOTE — Telephone Encounter (Signed)
 Spoke to Patient to let her know that we can not schedule with [facility] so she needs to call and schedule her screening mammogram for 6/25. Patient states she will call and schedule it.

## 2023-12-14 ENCOUNTER — Ambulatory Visit: Admitting: Physical Therapy

## 2023-12-19 ENCOUNTER — Ambulatory Visit: Admitting: Physical Therapy

## 2023-12-21 ENCOUNTER — Ambulatory Visit

## 2023-12-27 ENCOUNTER — Ambulatory Visit: Admitting: Physical Therapy

## 2023-12-28 ENCOUNTER — Other Ambulatory Visit: Payer: Self-pay | Admitting: Internal Medicine

## 2023-12-29 ENCOUNTER — Ambulatory Visit: Admitting: Physical Therapy

## 2023-12-29 NOTE — Telephone Encounter (Signed)
 Spoke with pt and she stated that she did request refill.She stated that she uses it if her acid reflux flares up real bad.   Refilled: 07/04/2023 Last OV: 11/29/2023 Next OV: 03/25/2024

## 2023-12-30 NOTE — Telephone Encounter (Signed)
 Rx sent in for zofran . Please call and notify her rx sent in, but if she is having increased acid reflux requiring additional medication, needs to schedule f/u with me to discuss.

## 2024-01-02 ENCOUNTER — Ambulatory Visit: Admitting: Physical Therapy

## 2024-01-04 ENCOUNTER — Ambulatory Visit: Admitting: Physical Therapy

## 2024-01-09 ENCOUNTER — Ambulatory Visit: Admitting: Physical Therapy

## 2024-01-11 ENCOUNTER — Ambulatory Visit: Payer: Self-pay | Admitting: Internal Medicine

## 2024-01-11 ENCOUNTER — Ambulatory Visit
Admission: RE | Admit: 2024-01-11 | Discharge: 2024-01-11 | Disposition: A | Discharge status: Home / Self Care | Source: Ambulatory Visit | Attending: Internal Medicine | Admitting: Internal Medicine

## 2024-01-11 ENCOUNTER — Ambulatory Visit

## 2024-01-11 DIAGNOSIS — Z1231 Encounter for screening mammogram for malignant neoplasm of breast: Secondary | ICD-10-CM

## 2024-01-11 DIAGNOSIS — E2839 Other primary ovarian failure: Secondary | ICD-10-CM | POA: Diagnosis present

## 2024-01-12 ENCOUNTER — Inpatient Hospital Stay
Admission: RE | Admit: 2024-01-12 | Discharge: 2024-01-12 | Disposition: A | Discharge status: Home / Self Care | Payer: Self-pay | Source: Ambulatory Visit | Attending: Internal Medicine | Admitting: Internal Medicine

## 2024-01-12 ENCOUNTER — Other Ambulatory Visit: Payer: Self-pay | Admitting: *Deleted

## 2024-01-12 ENCOUNTER — Other Ambulatory Visit: Payer: Self-pay | Admitting: Internal Medicine

## 2024-01-12 DIAGNOSIS — Z1231 Encounter for screening mammogram for malignant neoplasm of breast: Secondary | ICD-10-CM

## 2024-01-12 DIAGNOSIS — R928 Other abnormal and inconclusive findings on diagnostic imaging of breast: Secondary | ICD-10-CM

## 2024-01-16 ENCOUNTER — Ambulatory Visit: Admitting: Physical Therapy

## 2024-01-17 ENCOUNTER — Ambulatory Visit
Admission: RE | Admit: 2024-01-17 | Discharge: 2024-01-17 | Disposition: A | Discharge status: Home / Self Care | Source: Ambulatory Visit | Attending: Internal Medicine | Admitting: Internal Medicine

## 2024-01-17 DIAGNOSIS — R928 Other abnormal and inconclusive findings on diagnostic imaging of breast: Secondary | ICD-10-CM | POA: Diagnosis present

## 2024-01-18 ENCOUNTER — Telehealth: Payer: Self-pay

## 2024-01-18 ENCOUNTER — Ambulatory Visit: Payer: Self-pay | Admitting: Internal Medicine

## 2024-01-18 ENCOUNTER — Ambulatory Visit: Admitting: Physical Therapy

## 2024-01-18 DIAGNOSIS — R140 Abdominal distension (gaseous): Secondary | ICD-10-CM

## 2024-01-18 DIAGNOSIS — K219 Gastro-esophageal reflux disease without esophagitis: Secondary | ICD-10-CM

## 2024-01-18 NOTE — Telephone Encounter (Signed)
 Pt reports that she has been having issues with bloating and increased reflux for awhile now despite medication (gas x and pepcid ) and says that we have talked about this at her recent appointments. She wants to see Dr Bridgett Camps in [facility] but has been 3 years since her last visit. New referral is needed. Ok to place referral? Confirmed with patient no acute issues. She is aware you are not in the office.

## 2024-01-18 NOTE — Addendum Note (Signed)
 Addended by: Raejean Bullock on: 01/18/2024 10:41 PM   Modules accepted: Orders

## 2024-01-18 NOTE — Telephone Encounter (Signed)
 Copied from CRM [id number]. Topic: General - Other >> Jan 18, 2024  9:05 AM Howard Macho wrote: Reason for CRM: patient called back and I read the results to her and she want to make an appointment with Laurell Pond because she is having a lot of indigestion. Patient want to know if she calls to make an appointment or do [facility] call CB [phone]

## 2024-01-18 NOTE — Telephone Encounter (Unsigned)
 Copied from CRM [id number]. Topic: General - Call Back - No Documentation >> Jan 18, 2024 12:47 PM Helen Marshall wrote: Reason for CRM: Patient called back in regards to communication with [place]. From the notes today, advised patient that to see Dr. Bridgett Camps, a new referral is needed. Patients contact is [phone].

## 2024-01-18 NOTE — Telephone Encounter (Signed)
 Order placed for GI referral to Dr Bridgett Camps.

## 2024-01-19 NOTE — Telephone Encounter (Signed)
 Left detailed message for patient.

## 2024-01-23 ENCOUNTER — Ambulatory Visit: Admitting: Physical Therapy

## 2024-01-25 ENCOUNTER — Ambulatory Visit: Admitting: Physical Therapy

## 2024-02-02 ENCOUNTER — Ambulatory Visit
Admission: EM | Admit: 2024-02-02 | Discharge: 2024-02-02 | Disposition: A | Discharge status: Home / Self Care | Attending: Emergency Medicine | Admitting: Emergency Medicine

## 2024-02-02 DIAGNOSIS — J0100 Acute maxillary sinusitis, unspecified: Secondary | ICD-10-CM | POA: Diagnosis not present

## 2024-02-02 LAB — POCT RAPID STREP A (OFFICE): Rapid Strep A Screen: NEGATIVE

## 2024-02-02 MED ORDER — AMOXICILLIN-POT CLAVULANATE 875-125 MG PO TABS
1.0000 | ORAL_TABLET | Freq: Two times a day (BID) | ORAL | 0 refills | Status: DC
Start: 2024-02-02 — End: 2024-02-20

## 2024-02-02 NOTE — ED Triage Notes (Signed)
 Sore throat, cough x 6 days. Taking mucinex and OTC allergy medication.

## 2024-02-02 NOTE — Discharge Instructions (Addendum)
 Take the Augmentin as directed.  Follow up with your primary care provider if your symptoms are not improving.

## 2024-02-02 NOTE — ED Provider Notes (Signed)
 [facility]    CSN: [id number] Arrival date & time: 02/02/24  1000      History   Chief Complaint Chief Complaint  Patient presents with   Sore Throat    HPI Helen Marshall is a 78 y.o. female.  Patient presents with 1 week history of sore throat, congestion, cough.  No fever or shortness of breath.  She has been treating her symptoms with Mucinex.  Patient was seen by her PCP on 09/09/2023 for sinusitis and treated with Augmentin .  The history is provided by the patient and medical records.    Past Medical History:  Diagnosis Date   Abnormal LFTs    Abnormal mammogram 12/19/2012   left   Allergy    Anemia    Arthritis    Cancer [facility]) 2014   left Breast   Carotid bruit    left   Cataract    Colon polyps    Diverticulosis 2012   Environmental allergies    Family history of adverse reaction to anesthesia    her aunt was hard to wake up   GERD (gastroesophageal reflux disease)    Heart murmur    History of diverticulitis 05/2021   Hypercholesterolemia    Hyperglycemia    Hypertension    IBS (irritable bowel syndrome)    Lipoma of colon    Lumbar disc disease    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   Parkinson's disease (HCC) 09/30/2022   Leukocytosis 09/28/2022   Lumbar stenosis with neurogenic claudication 06/18/2022   Dermatochalasis of both eyelids 06/12/2022   Squamous cell skin cancer 05/30/2022   Tremor  05/30/2022   History of breast cancer 09/29/2021   Pain around toenail 09/19/2021   Knee pain 09/19/2021   Lung nodule 04/26/2021   Back pain 07/23/2020   Tremor of both hands 03/16/2020   Vaginal prolapse 03/16/2020   Hand pain, left 06/06/2019   Osteopenia 05/27/2019   Estrogen deficiency 04/22/2019   Sinus infection 07/21/2017   Anemia 11/05/2015   Health care maintenance 11/17/2014   Umbilical hernia 01/17/2013   Neoplasm of left breast, 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   GERD (gastroesophageal reflux disease) 07/22/2012   Diverticulosis 07/21/2012   Hypertension 07/21/2012   Hypercholesterolemia 07/21/2012   Abnormal liver function test 07/21/2012   Left carotid bruit 07/21/2012   Hyperglycemia 07/21/2012   Environmental allergies 07/21/2012   Lumbar disc disease 07/21/2012    Past Surgical History:  Procedure Laterality Date   ABDOMINAL HYSTERECTOMY  1989   fibroids   ANKLE SURGERY Left 1999   ANTERIOR AND POSTERIOR REPAIR WITH SACROSPINOUS FIXATION N/A 07/20/2021   Procedure: ANTERIOR AND REPAIR WITH SACROSPINOUS FIXATION;  Surgeon: Marilynne Rosaline SAILOR, MD;  Location: [facility];  Service: Gynecology;  Laterality: N/A;   BREAST LUMPECTOMY Left 2014   breast ca radation no chemp per pt   BREAST SURGERY Left 1970  biopsy   CATARACT EXTRACTION, BILATERAL  June & July 2017   CHOLECYSTECTOMY  2001   COLONOSCOPY  06/02/2011   Deward Piedmont, MD; diverticulosis, submucosal lipoma of the sigmoid colon.   CYSTOSCOPY N/A 07/20/2021   Procedure: CYSTOSCOPY;  Surgeon: Marilynne Rosaline SAILOR, MD;  Location: [facility];  Service: Gynecology;  Laterality: N/A;   eyelid lift Bilateral    HERNIA REPAIR  2001, 2004   HERNIA REPAIR  01/22/2013   Repeat repair of umbilical defect, 2.5 cm. Primary repair.   INSERTION OF MESH N/A 03/31/2022   Procedure: INSERTION OF MESH;  Surgeon: Dessa Reyes ORN, MD;   Location: [facility];  Service: General;  Laterality: N/A;   LAPAROSCOPIC HYSTERECTOMY     LUMBAR LAMINECTOMY/DECOMPRESSION MICRODISCECTOMY Bilateral 06/18/2022   Procedure: Bilateral L3-4 Lumbar decompression;  Surgeon: Gillie Duncans, MD;  Location: [facility];  Service: Neurosurgery;  Laterality: Bilateral;   NASAL SINUS SURGERY  1997   rotator cuff surgery  1998   SQUAMOUS CELL CARCINOMA EXCISION Right 05/2013   shoulder   TUBAL LIGATION  1976   UPPER GI ENDOSCOPY  08/22/2014   Dr JINNY. Albertus   VENTRAL HERNIA REPAIR N/A 03/31/2022   Procedure: HERNIA REPAIR VENTRAL ADULT;  Surgeon: Dessa Reyes ORN, MD;  Location: [facility];  Service: General;  Laterality: N/A;  Shelba Rakers, RNFA to assist TAPP block per anesthesia    OB History     Gravida  2   Para  2   Term      Preterm      AB      Living  2      SAB      IAB      Ectopic      Multiple      Live Births  2        Obstetric Comments  1st Menstrual Cycle:  12 1st Pregnancy:  25          Home Medications    Prior to Admission medications   Medication Sig Start Date End Date Taking? Authorizing Provider  amLODipine  (NORVASC ) 5 MG tablet TAKE 1 TABLET BY MOUTH DAILY 11/17/23  Yes Glendia Shad, MD  amoxicillin -clavulanate (AUGMENTIN ) 875-125 MG tablet Take 1 tablet by mouth every 12 (twelve) hours. 02/02/24  Yes Corlis Burnard DEL, NP  aspirin EC 325 MG tablet Take 325 mg by mouth daily.   Yes [provider]  benazepril  (LOTENSIN ) 40 MG tablet Take 1 tablet (40 mg total) by mouth daily. 11/29/23  Yes Glendia Shad, MD  Calcium  Carb-Cholecalciferol  (CALCIUM  600 + D PO) Take 1 tablet by mouth daily.   Yes [provider]  carbidopa -levodopa  (SINEMET  IR) 25-100 MG tablet Take 1 tablet by mouth 3 (three) times daily. 8am/noon/4pm 09/15/23  Yes Tat, Asberry RAMAN, DO  ezetimibe  (ZETIA ) 10 MG tablet Take 1 tablet (10 mg total) by mouth daily. 08/22/23  Yes Glendia Shad, MD  fish oil-omega-3 fatty acids  1000 MG capsule Take 2 g by mouth 2 (two) times daily.   Yes [provider]  loratadine  (CLARITIN ) 10 MG tablet Take 10 mg by mouth daily as needed for allergies.   Yes [provider]  Multiple Vitamin (MULTIVITAMIN) tablet Take 1 tablet by mouth daily.   Yes [provider]  niacin (VITAMIN B3) 500 MG tablet Take 1,000 mg by mouth 2 (two) times daily.   Yes [provider]  Povidone, PF, (IVIZIA DRY EYES) 0.5 % SOLN Place 1 drop  into both eyes 2 (two) times daily.   Yes [provider]  Probiotic Product (VSL#3 PO) Take 1 capsule by mouth daily.   Yes [provider]  sertraline  (ZOLOFT ) 25 MG tablet Take 1 tablet (25 mg total) by mouth daily. 08/22/23  Yes Glendia Shad, MD  albuterol  (VENTOLIN  HFA) 108 (90 Base) MCG/ACT inhaler INHALE 2 PUFFS EVERY 6 HOURS AS NEEDED FOR WHEEZING OR SHORTNESS OF BREATH 10/21/23   Glendia Shad, MD  fluticasone  (FLONASE ) 50 MCG/ACT nasal spray PLACE 2 SPRAYS INTO BOTH NOSTRILS AS NEEDED. 07/12/23   Glendia Shad, MD  ibuprofen  (ADVIL ) 200 MG tablet Take 400 mg by mouth every 6 (six) hours as needed for moderate pain.    [provider]  lidocaine  (LIDODERM ) 5 % Place 1 patch onto the skin daily as needed (pain). Remove & Discard patch within 12 hours or as directed by MD    [provider]  ondansetron  (ZOFRAN -ODT) 4 MG disintegrating tablet DISSOLVE ONE TABLET ON TONGUE EVERY 8 HOURS AS NEEDED FOR NAUSEA / VOMITING 12/30/23   Glendia Shad, MD  Simethicone (GAS-X PO) Take 2 tablets by mouth daily as needed (gas).    [provider]  triamcinolone  cream (KENALOG ) 0.1 % Apply 1 Application topically as needed (psoriasis). 09/21/22   Glendia Shad, MD    Family History Family History  Problem Relation Age of Onset   Heart disease Mother        myocardial infarction   Diabetes Mother    Hearing loss Mother    CVA Father    Alcohol  abuse Father    Arthritis/Rheumatoid  Sister        x2   Breast cancer Sister 89   Arthritis Sister    Hearing loss Sister    Arthritis Sister    Hearing loss Sister    Asthma Sister    Arthritis Sister    Hearing loss Sister    Hypothyroidism Sister    Arthritis Sister    Hearing loss Sister    Breast cancer Sister 88   Colon polyps Sister    Arthritis Sister    Hearing loss Sister    Stroke Daughter    Colon cancer Maternal Uncle    Hypertension Brother        x2   Arthritis Brother    Hearing loss Brother    Alcohol  abuse Brother    Arthritis Brother    Hearing loss Brother    Colon polyps Son    Alcohol  abuse Son    Cancer Other        breast - paternal first cousin    Social History Social History   Tobacco Use   Smoking status: Never   Smokeless tobacco: Never  Vaping Use   Vaping status: Never Used  Substance Use Topics   Alcohol  use: No    Alcohol /week: 0.0 standard drinks of alcohol    Drug use: No     Allergies   Bactrim  [sulfamethoxazole -trimethoprim ], Crestor [rosuvastatin], Demerol [meperidine], Latex, Lipitor [atorvastatin], Lovastatin, Oxycodone , Pravastatin, Tramadol , Vicodin [hydrocodone-acetaminophen ], Zocor [simvastatin], and Gabapentin    Review of Systems Review of Systems  Constitutional:  Negative for chills and fever.  HENT:  Positive for congestion and sore throat. Negative for ear pain.   Respiratory:  Positive for cough. Negative for shortness of breath.      Physical Exam Triage Vital Signs ED Triage Vitals [02/02/24 1014]  Encounter Vitals Group     BP 104/68     Girls  Systolic BP Percentile      Girls Diastolic BP Percentile      Boys Systolic BP Percentile      Boys Diastolic BP Percentile      Pulse Rate 80     Resp 18     Temp 97.7 F (36.5 C)     Temp Source Oral     SpO2 95 %     Weight      Height      Head Circumference      Peak Flow      Pain Score 5     Pain Loc      Pain Education      Exclude from Growth Chart    No data  found.  Updated Vital Signs BP 104/68 (BP Location: Left Arm)   Pulse 80   Temp 97.7 F (36.5 C) (Oral)   Resp 18   LMP 07/21/1988   SpO2 95%   Visual Acuity Right Eye Distance:   Left Eye Distance:   Bilateral Distance:    Right Eye Near:   Left Eye Near:    Bilateral Near:     Physical Exam Constitutional:      General: She is not in acute distress. HENT:     Right Ear: Tympanic membrane normal.     Left Ear: Tympanic membrane normal.     Nose: Congestion present.     Mouth/Throat:     Mouth: Mucous membranes are moist.     Pharynx: Posterior oropharyngeal erythema present.  Cardiovascular:     Rate and Rhythm: Normal rate and regular rhythm.     Heart sounds: Normal heart sounds.  Pulmonary:     Effort: Pulmonary effort is normal. No respiratory distress.     Breath sounds: Normal breath sounds.  Neurological:     Mental Status: She is alert.      UC Treatments / Results  Labs (all labs ordered are listed, but only abnormal results are displayed) Labs Reviewed  POCT RAPID STREP A (OFFICE)    EKG   Radiology No results found.  Procedures Procedures (including critical care time)  Medications Ordered in UC Medications - No data to display  Initial Impression / Assessment and Plan / UC Course  I have reviewed the triage vital signs and the nursing notes.  Pertinent labs & imaging results that were available during my care of the patient were reviewed by me and considered in my medical decision making (see chart for details).    Sinusitis.  Afebrile and vital signs are stable.  Lungs are clear and O2 sat is 95% on room air.  Treating today with Augmentin .  Education provided on sinus infection.  Instructed patient to follow-up with her PCP if she is not improving.  She agrees to plan of care.  Final Clinical Impressions(s) / UC Diagnoses   Final diagnoses:  Acute non-recurrent maxillary sinusitis     Discharge Instructions      Take the  Augmentin  as directed.  Follow-up with your primary care provider if your symptoms are not improving.      ED Prescriptions     Medication Sig Dispense Auth. Provider   amoxicillin -clavulanate (AUGMENTIN ) 875-125 MG tablet Take 1 tablet by mouth every 12 (twelve) hours. 14 tablet Corlis Burnard DEL, NP      PDMP not reviewed this encounter.   Corlis Burnard DEL, NP 02/02/24 1039

## 2024-02-20 ENCOUNTER — Ambulatory Visit (INDEPENDENT_AMBULATORY_CARE_PROVIDER_SITE_OTHER): Payer: Medicare Other | Admitting: *Deleted

## 2024-02-20 VITALS — Ht 62.75 in | Wt 165.0 lb

## 2024-02-20 DIAGNOSIS — Z0000 Encounter for general adult medical examination without abnormal findings: Secondary | ICD-10-CM | POA: Diagnosis not present

## 2024-02-20 NOTE — Progress Notes (Signed)
 Subjective:   Helen Marshall is a 78 y.o. who presents for a Medicare Wellness preventive visit.  As a reminder, Annual Wellness Visits don't include a physical exam, and some assessments may be limited, especially if this visit is performed virtually. We may recommend an in-person follow-up visit with your provider if needed.  Visit Complete: Virtual I connected with  Helen Marshall on 02/20/24 by a audio enabled telemedicine application and verified that I am speaking with the correct person using two identifiers.  Patient Location: Home  Provider Location: Home Office  I discussed the limitations of evaluation and management by telemedicine. The patient expressed understanding and agreed to proceed.  Vital Signs: Because this visit was a virtual/telehealth visit, some criteria may be missing or patient reported. Any vitals not documented were not able to be obtained and vitals that have been documented are patient reported.  VideoDeclined- This patient declined Librarian, academic. Therefore the visit was completed with audio only.  Persons Participating in Visit: Patient.  AWV Questionnaire: Yes: Patient Medicare AWV questionnaire was completed by the patient on 02/17/24; I have confirmed that all information answered by patient is correct and no changes since this date.  Cardiac Risk Factors include: advanced age (>74men, >20 women);dyslipidemia;hypertension     Objective:    Today's Vitals   02/20/24 1054  Weight: 165 lb (74.8 kg)  Height: 5' 2.75 (1.594 m)   Body mass index is 29.46 kg/m.     02/20/2024   11:12 AM 10/06/2023    2:15 PM 09/15/2023   11:25 AM 03/11/2023    8:47 AM 02/15/2023    1:15 PM 09/30/2022    9:35 AM 06/19/2022    8:00 AM  Advanced Directives  Does Patient Have a Medical Advance Directive? Yes Yes Yes Yes No Yes Yes  Type of Estate agent of [place];Living will Living will;Healthcare Power of  Attorney Living will Healthcare Power of [place];Living will  Living will Healthcare Power of [place];Living will  Does patient want to make changes to medical advance directive? No - Patient declined No - Patient declined     No - Patient declined  Copy of Healthcare Power of Attorney in Chart? Yes - validated most recent copy scanned in chart (See row information)      Yes - validated most recent copy scanned in chart (See row information)  Would patient like information on creating a medical advance directive?     No - Patient declined      Current Medications (verified) Outpatient Encounter Medications as of 02/20/2024  Medication Sig   albuterol  (VENTOLIN  HFA) 108 (90 Base) MCG/ACT inhaler INHALE 2 PUFFS EVERY 6 HOURS AS NEEDED FOR WHEEZING OR SHORTNESS OF BREATH   amLODipine  (NORVASC ) 5 MG tablet TAKE 1 TABLET BY MOUTH DAILY   aspirin EC 325 MG tablet Take 325 mg by mouth daily.   benazepril  (LOTENSIN ) 40 MG tablet Take 1 tablet (40 mg total) by mouth daily.   Calcium  Carb-Cholecalciferol  (CALCIUM  600 + D PO) Take 1 tablet by mouth daily.   carbidopa -levodopa  (SINEMET  IR) 25-100 MG tablet Take 1 tablet by mouth 3 (three) times daily. 8am/noon/4pm   ezetimibe  (ZETIA ) 10 MG tablet Take 1 tablet (10 mg total) by mouth daily.   fish oil-omega-3 fatty acids 1000 MG capsule Take 2 g by mouth 2 (two) times daily.   fluticasone  (FLONASE ) 50 MCG/ACT nasal spray PLACE 2 SPRAYS INTO BOTH NOSTRILS AS NEEDED.   ibuprofen  (ADVIL )  200 MG tablet Take 400 mg by mouth every 6 (six) hours as needed for moderate pain.   lidocaine  (LIDODERM ) 5 % Place 1 patch onto the skin daily as needed (pain). Remove & Discard patch within 12 hours or as directed by MD   loratadine  (CLARITIN ) 10 MG tablet Take 10 mg by mouth daily as needed for allergies.   Magnesium 400 MG CAPS Take by mouth daily.   Multiple Vitamin (MULTIVITAMIN) tablet Take 1 tablet by mouth daily.   niacin (VITAMIN B3) 500 MG tablet Take 1,000 mg by  mouth 2 (two) times daily.   ondansetron  (ZOFRAN -ODT) 4 MG disintegrating tablet DISSOLVE ONE TABLET ON TONGUE EVERY 8 HOURS AS NEEDED FOR NAUSEA / VOMITING   Povidone, PF, (IVIZIA DRY EYES) 0.5 % SOLN Place 1 drop into both eyes 2 (two) times daily.   Probiotic Product (VSL#3 PO) Take 1 capsule by mouth daily.   sertraline  (ZOLOFT ) 25 MG tablet Take 1 tablet (25 mg total) by mouth daily.   Simethicone (GAS-X PO) Take 2 tablets by mouth daily as needed (gas).   triamcinolone  cream (KENALOG ) 0.1 % Apply 1 Application topically as needed (psoriasis).   [DISCONTINUED] amoxicillin -clavulanate (AUGMENTIN ) 875-125 MG tablet Take 1 tablet by mouth every 12 (twelve) hours.   No facility-administered encounter medications on file as of 02/20/2024.    Allergies (verified) Bactrim  [sulfamethoxazole -trimethoprim ], Crestor [rosuvastatin], Demerol [meperidine], Latex, Lipitor [atorvastatin], Lovastatin, Oxycodone , Pravastatin, Tramadol , Vicodin [hydrocodone-acetaminophen ], Zocor [simvastatin], and Gabapentin    History: Past Medical History:  Diagnosis Date   Abnormal LFTs    Abnormal mammogram 12/19/2012   left   Allergy    Anemia    Arthritis    Cancer (HCC) 2014   left Breast   Carotid bruit    left   Cataract    Colon polyps    Diverticulosis 2012   Environmental allergies    Family history of adverse reaction to anesthesia    her aunt was hard to wake up   GERD (gastroesophageal reflux disease)    Heart murmur    History of diverticulitis 05/2021   Hypercholesterolemia    Hyperglycemia    Hypertension    IBS (irritable bowel syndrome)    Lipoma of colon    Lumbar disc disease    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   Pre-diabetes    Tubular adenoma of colon     Umbilical hernia 05/2021   Past Surgical History:  Procedure Laterality Date   ABDOMINAL HYSTERECTOMY  1989   fibroids   ANKLE SURGERY Left 1999   ANTERIOR AND POSTERIOR REPAIR WITH SACROSPINOUS FIXATION N/A 07/20/2021   Procedure: ANTERIOR AND REPAIR WITH SACROSPINOUS FIXATION;  Surgeon: Marilynne Rosaline SAILOR, MD;  Location: [facility];  Service: Gynecology;  Laterality: N/A;   BREAST LUMPECTOMY Left 2014   breast ca radation no chemp per pt   BREAST SURGERY Left 1970   biopsy   CATARACT EXTRACTION, BILATERAL  June & July 2017   CHOLECYSTECTOMY  2001   COLONOSCOPY  06/02/2011   Deward Piedmont, MD; diverticulosis, submucosal lipoma of the sigmoid colon.   CYSTOSCOPY N/A 07/20/2021   Procedure: CYSTOSCOPY;  Surgeon: Marilynne Rosaline SAILOR, MD;  Location: [facility];  Service: Gynecology;  Laterality: N/A;   eyelid lift Bilateral    HERNIA  REPAIR  2001, 2004   HERNIA REPAIR  01/22/2013   Repeat repair of umbilical defect, 2.5 cm. Primary repair.   INSERTION OF MESH N/A 03/31/2022   Procedure: INSERTION OF MESH;  Surgeon: Dessa Reyes ORN, MD;  Location: [facility];  Service: General;  Laterality: N/A;   LAPAROSCOPIC HYSTERECTOMY     LUMBAR LAMINECTOMY/DECOMPRESSION MICRODISCECTOMY Bilateral 06/18/2022   Procedure: Bilateral L3-4 Lumbar decompression;  Surgeon: Gillie Duncans, MD;  Location: [facility];  Service: Neurosurgery;  Laterality: Bilateral;   NASAL SINUS SURGERY  1997   rotator cuff surgery  1998   SQUAMOUS CELL CARCINOMA EXCISION Right 05/2013   shoulder   TUBAL LIGATION  1976   UPPER GI ENDOSCOPY  08/22/2014   Dr JINNY. Albertus   VENTRAL HERNIA REPAIR N/A 03/31/2022   Procedure: HERNIA REPAIR VENTRAL ADULT;  Surgeon: Dessa Reyes ORN, MD;  Location: [facility];  Service: General;  Laterality: N/A;  Shelba Rakers, RNFA to assist TAPP block per anesthesia   Family History  Problem Relation Age of Onset   Heart disease Mother        myocardial infarction    Diabetes Mother    Hearing loss Mother    CVA Father    Alcohol  abuse Father    Arthritis/Rheumatoid Sister        x2   Breast cancer Sister 63   Arthritis Sister    Hearing loss Sister    Arthritis Sister    Hearing loss Sister    Asthma Sister    Arthritis Sister    Hearing loss Sister    Hypothyroidism Sister    Arthritis Sister    Hearing loss Sister    Breast cancer Sister 40   Colon polyps Sister    Arthritis Sister    Hearing loss Sister    Stroke Daughter    Colon cancer Maternal Uncle    Hypertension Brother        x2   Arthritis Brother    Hearing loss Brother    Alcohol  abuse Brother    Arthritis Brother    Hearing loss Brother    Colon polyps Son    Alcohol  abuse Son    Cancer Other        breast - paternal first cousin   Social History   Socioeconomic History   Marital status: Married    Spouse name: ,Vicenta   Number of children: 2   Years of education: Not on file   Highest education level: Associate degree: occupational, Scientist, product/process development, or vocational program  Occupational History   Occupation: Retired    Comment: Designer, 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  Tobacco Use   Smoking status: Never   Smokeless tobacco: Never  Vaping Use   Vaping status: Never Used  Substance and Sexual Activity   Alcohol  use: No    Alcohol /week: 0.0 standard drinks of alcohol    Drug use: No   Sexual activity: Not Currently  Other Topics Concern   Not on file  Social History Narrative   Right handed   Retired    Social Drivers of Corporate investment banker Strain: Low Risk  (02/17/2024)   Overall Financial Resource Strain (CARDIA)    Difficulty of Paying Living Expenses: Not hard at all  Food Insecurity: No Food Insecurity (02/17/2024)   Hunger Vital Sign    Worried About Running Out of Food in the Last Year: Never true    Ran Out of Food in the Last Year: Never true  Transportation Needs:  No Transportation Needs (02/17/2024)   PRAPARE - Administrator, Civil Service  (Medical): No    Lack of Transportation (Non-Medical): No  Physical Activity: Sufficiently Active (02/17/2024)   Exercise Vital Sign    Days of Exercise per Week: 3 days    Minutes of Exercise per Session: 60 min  Stress: No Stress Concern Present (02/17/2024)   [employer] of Occupational Health - Occupational Stress Questionnaire    Feeling of Stress: Only a little  Social Connections: Socially Integrated (02/17/2024)   Social Connection and Isolation Panel    Frequency of Communication with Friends and Family: More than three times a week    Frequency of Social Gatherings with Friends and Family: Once a week    Attends Religious Services: More than 4 times per year    Active Member of [employer] or Organizations: Yes    Attends Engineer, structural: More than 4 times per year    Marital Status: Married    Tobacco Counseling Counseling given: Not Answered    Clinical Intake:  Pre-visit preparation completed: Yes  Pain : No/denies pain     BMI - recorded: 29.46 Nutritional Status: BMI 25 -29 Overweight Nutritional Risks: None Diabetes: No  Lab Results  Component Value Date   HGBA1C 5.9 11/25/2023   HGBA1C 5.8 07/04/2023   HGBA1C 5.7 02/25/2023     How often do you need to have someone help you when you read instructions, pamphlets, or other written materials from your doctor or pharmacy?: 1 - Never  Interpreter Needed?: No  Information entered by :: R. Sentoria Brent LPN   Activities of Daily Living     02/20/2024   10:56 AM  In your present state of health, do you have any difficulty performing the following activities:  Hearing? 0  Vision? 0  Comment glasses  Difficulty concentrating or making decisions? 0  Walking or climbing stairs? 0  Dressing or bathing? 0  Doing errands, shopping? 0  Preparing Food and eating ? N  Using the Toilet? N  In the past six months, have you accidently leaked urine? Y  Do you have problems with loss of bowel control? Y   Managing your Medications? N  Managing your Finances? N  Housekeeping or managing your Housekeeping? N    Patient Care Team: Glendia Shad, MD as PCP - General (Internal Medicine) Dessa Reyes ORN, MD (General Surgery) Dasher, Alm LABOR, MD (Dermatology) Troxler, Donnice, DPM (Inactive) (Podiatry)  I have updated your Care Teams any recent Medical Services you may have received from other providers in the past year.     Assessment:   This is a routine wellness examination for [employer].  Hearing/Vision screen Hearing Screening - Comments:: No issues Vision Screening - Comments:: glasses   Goals Addressed             This Visit's Progress    Patient Stated       Wants to continue to exercise       Depression Screen     02/20/2024   11:05 AM 06/14/2023    3:05 PM 02/15/2023    1:13 PM 09/21/2022    1:27 PM 01/12/2022   10:16 AM 01/04/2022   10:26 AM 12/30/2020   11:29 AM  PHQ 2/9 Scores  PHQ - 2 Score 0 1 0 0 1 0 0  PHQ- 9 Score 3 4 0        Fall Risk     02/20/2024   10:59  AM 09/15/2023   11:25 AM 06/14/2023    3:05 PM 03/11/2023    8:47 AM 02/15/2023    1:15 PM  Fall Risk   Falls in the past year? 0 0 0 0 0  Number falls in past yr: 0 0 0 0 0  Injury with Fall? 0 0 0 0 0  Risk for fall due to : No Fall Risks  No Fall Risks  No Fall Risks  Follow up Falls evaluation completed;Falls prevention discussed Falls evaluation completed Falls evaluation completed Falls evaluation completed Falls prevention discussed;Falls evaluation completed    MEDICARE RISK AT HOME:  Medicare Risk at Home Any stairs in or around the home?: Yes If so, are there any without handrails?: No Home free of loose throw rugs in walkways, pet beds, electrical cords, etc?: Yes Adequate lighting in your home to reduce risk of falls?: Yes Life alert?: No Use of a cane, walker or w/c?: Yes (cane sometimes during the night) Grab bars in the bathroom?: Yes Shower chair or bench in shower?:  Yes Elevated toilet seat or a handicapped toilet?: No  TIMED UP AND GO:  Was the test performed?  No  Cognitive Function: 6CIT completed    08/09/2017   10:27 AM 07/12/2016    8:43 AM 07/17/2015    1:37 PM  MMSE - Mini Mental State Exam  Orientation to time 5  5  5    Orientation to Place 5  5  5    Registration 3  3  3    Attention/ Calculation 5  5  5    Recall 3  3  3    Language- name 2 objects 2  2  2    Language- repeat 1 1 1   Language- follow 3 step command 3  3  3    Language- read & follow direction 1  1  1    Write a sentence 1  1  1    Copy design 1  1  1    Total score 30  30  30       Data saved with a previous flowsheet row definition        02/20/2024   11:12 AM 02/15/2023    1:17 PM 12/30/2020   11:43 AM 12/27/2019   11:17 AM 08/10/2018   11:27 AM  6CIT Screen  What Year? 0 points 0 points 0 points 0 points 0 points  What month? 0 points 0 points 0 points 0 points 0 points  What time? 0 points 3 points 0 points 0 points 0 points  Count back from 20 0 points 0 points 0 points 0 points 0 points  Months in reverse 0 points 0 points 0 points 0 points 0 points  Repeat phrase 2 points 0 points 0 points 0 points 0 points  Total Score 2 points 3 points 0 points 0 points 0 points    Immunizations Immunization History  Administered Date(s) Administered   Fluad Quad(high Dose 65+) 04/19/2019, 07/15/2020, 05/12/2021, 05/21/2022, 05/03/2023   Influenza Split 05/16/2012, 04/02/2017   Influenza, High Dose Seasonal PF 05/15/2016, 03/30/2017, 05/25/2018   Influenza,inj,Quad PF,6+ Mos 05/09/2013, 05/23/2014, 07/03/2015   PFIZER Comirnaty(Gray Top)Covid-19 Tri-Sucrose Vaccine 03/31/2020   PFIZER(Purple Top)SARS-COV-2 Vaccination 08/09/2019, 09/01/2019, 03/31/2020, 11/21/2020   Pneumococcal Conjugate-13 09/13/2013   Pneumococcal Polysaccharide-23 11/08/2014   Pneumococcal-Unspecified 08/02/2009   Tdap 04/26/2014   Zoster Recombinant(Shingrix) 01/03/2018, 03/21/2018   Zoster,  Live 08/02/2009    Screening Tests Health Maintenance  Topic Date Due   COVID-19 Vaccine (6 - 2024-25  season) 04/03/2023   Medicare Annual Wellness (AWV)  02/15/2024   INFLUENZA VACCINE  03/02/2024   DTaP/Tdap/Td (2 - Td or Tdap) 04/26/2024   MAMMOGRAM  01/10/2025   Pneumococcal Vaccine: 50+ Years  Completed   DEXA SCAN  Completed   Hepatitis C Screening  Completed   Zoster Vaccines- Shingrix  Completed   Hepatitis B Vaccines  Aged Out   HPV VACCINES  Aged Out   Meningococcal B Vaccine  Aged Out   Colonoscopy  Discontinued    Health Maintenance  Health Maintenance Due  Topic Date Due   COVID-19 Vaccine (6 - 2024-25 season) 04/03/2023   Medicare Annual Wellness (AWV)  02/15/2024   Health Maintenance Items Addressed: Discussed the need to update flu and covid vaccines annually.   Additional Screening:  Vision Screening: Recommended annual ophthalmology exams for early detection of glaucoma and other disorders of the eye.Up to date [facility] Would you like a referral to an eye doctor? No    Dental Screening: Recommended annual dental exams for proper oral hygiene  Community Resource Referral / Chronic Care Management: CRR required this visit?  No   CCM required this visit?  No   Plan:    I have personally reviewed and noted the following in the patient's chart:   Medical and social history Use of alcohol , tobacco or illicit drugs  Current medications and supplements including opioid prescriptions. Patient is not currently taking opioid prescriptions. Functional ability and status Nutritional status Physical activity Advanced directives List of other physicians Hospitalizations, surgeries, and ER visits in previous 12 months Vitals Screenings to include cognitive, depression, and falls Referrals and appointments  In addition, I have reviewed and discussed with patient certain preventive protocols, quality metrics, and best practice recommendations. A  written personalized care plan for preventive services as well as general preventive health recommendations were provided to patient.   Angeline Fredericks, LPN   [id number]   After Visit Summary: (MyChart) Due to this being a telephonic visit, the after visit summary with patients personalized plan was offered to patient via MyChart   Notes: Nothing significant to report at this time.

## 2024-02-20 NOTE — Patient Instructions (Signed)
 Ms. Fettes , Thank you for taking time out of your busy schedule to complete your Annual Wellness Visit with me. I enjoyed our conversation and look forward to speaking with you again next year. I, as well as your care team,  appreciate your ongoing commitment to your health goals. Please review the following plan we discussed and let me know if I can assist you in the future. Your Game plan/ To Do List    Referrals: If you haven't heard from the office you've been referred to, please reach out to them at the phone provided.  Remember to update your flu and covid vaccines annually Follow up Visits: Next Medicare AWV with our clinical staff: 02/20/25 @ 1:40   Have you seen your provider in the last 6 months (3 months if uncontrolled diabetes)? Yes Next Office Visit with your provider: 03/30/24  Clinician Recommendations:  Aim for 30 minutes of exercise or brisk walking, 6-8 glasses of water , and 5 servings of fruits and vegetables each day.       This is a list of the screening recommended for you and due dates:  Health Maintenance  Topic Date Due   COVID-19 Vaccine (6 - 2024-25 season) 04/03/2023   Flu Shot  03/02/2024   DTaP/Tdap/Td vaccine (2 - Td or Tdap) 04/26/2024   Mammogram  01/10/2025   Medicare Annual Wellness Visit  02/19/2025   Pneumococcal Vaccine for age over 77  Completed   DEXA scan (bone density measurement)  Completed   Hepatitis C Screening  Completed   Zoster (Shingles) Vaccine  Completed   Hepatitis B Vaccine  Aged Out   HPV Vaccine  Aged Out   Meningitis B Vaccine  Aged Out   Colon Cancer Screening  Discontinued    Advanced directives: (In Chart) A copy of your advanced directives are scanned into your chart should your provider ever need it. Advance Care Planning is important because it:  [x]  Makes sure you receive the medical care that is consistent with your values, goals, and preferences  [x]  It provides guidance to your family and loved ones and reduces  their decisional burden about whether or not they are making the right decisions based on your wishes.

## 2024-03-10 ENCOUNTER — Other Ambulatory Visit: Payer: Self-pay | Admitting: Neurology

## 2024-03-10 DIAGNOSIS — G20A1 Parkinson's disease without dyskinesia, without mention of fluctuations: Secondary | ICD-10-CM

## 2024-03-14 NOTE — Telephone Encounter (Signed)
 Called and spoke to patient and she has plenty of meds to last until her appointment on 03/27/24

## 2024-03-23 NOTE — Progress Notes (Signed)
 Assessment/Plan:   1.  Parkinsons Disease, diagnosed February, 2024  -Continue carbidopa /levodopa  25/100, 1 tablet 3 times per day.  Medicine refilled today.  2.  Back pain  -Status post lumbar laminectomy with Dr. Gillie in November, 2023  3.  History of hypertension  -On amlodipine  and benzapril.  Will need to watch dual therapy with the course of time given propensity of Parkinsons for dysautonomia.  Primary care is monitoring.  4.  Urinary incontinence  -going to f/u with pcp and is now ready for meds.    5.  Antalgic gait  -Chiropractor told she needed lift in R shoe for leg length discrepancy and I told her instead to see her ortho as I suspect the issue is in the hip or back.   Subjective:   Helen Marshall was seen today in follow up for Parkinsons disease.  Outside records that were made available to me were reviewed.  Patient is continuing on levodopa .  She is doing well with the medication.  She was referred for physical therapy last visit.  She went to [facility] and those notes are reviewed.  She has had no falls.  No near syncope.  Has had some urinary incontinence and can't quite make it to RR - going to ask pcp about meds.   Saw ortho physician and was put on mobic.  Chiro told she needed lift in R shoe for leg length discrepancy  Current prescribed movement disorder medications: Carbidopa /levodopa  25/100, 1 tablet 3 times per day     ALLERGIES:   Allergies  Allergen Reactions   Bactrim  [Sulfamethoxazole -Trimethoprim ] Nausea Only   Crestor [Rosuvastatin] Other (See Comments)    Leg cramping    Demerol [Meperidine] Nausea And Vomiting   Latex Other (See Comments)    Redness, hands breakout   Lipitor [Atorvastatin] Other (See Comments)    pain    Lovastatin Other (See Comments)    Elevated CK   Oxycodone  Nausea And Vomiting   Pravastatin Other (See Comments)    pain   Tramadol  Nausea Only   Vicodin [Hydrocodone-Acetaminophen ] Other (See Comments)    Felt  weird    Zocor [Simvastatin] Other (See Comments)    pain    Gabapentin  Rash    CURRENT MEDICATIONS:  Current Meds  Medication Sig   albuterol  (VENTOLIN  HFA) 108 (90 Base) MCG/ACT inhaler INHALE 2 PUFFS EVERY 6 HOURS AS NEEDED FOR WHEEZING OR SHORTNESS OF BREATH   amLODipine  (NORVASC ) 5 MG tablet TAKE 1 TABLET BY MOUTH DAILY   aspirin EC 325 MG tablet Take 325 mg by mouth daily.   benazepril  (LOTENSIN ) 40 MG tablet Take 1 tablet (40 mg total) by mouth daily.   Calcium  Carb-Cholecalciferol  (CALCIUM  600 + D PO) Take 1 tablet by mouth daily.   ezetimibe  (ZETIA ) 10 MG tablet Take 1 tablet (10 mg total) by mouth daily.   fish oil-omega-3 fatty acids 1000 MG capsule Take 2 g by mouth 2 (two) times daily.   fluticasone  (FLONASE ) 50 MCG/ACT nasal spray PLACE 2 SPRAYS INTO BOTH NOSTRILS AS NEEDED.   ibuprofen  (ADVIL ) 200 MG tablet Take 400 mg by mouth every 6 (six) hours as needed for moderate pain.   lidocaine  (LIDODERM ) 5 % Place 1 patch onto the skin daily as needed (pain). Remove & Discard patch within 12 hours or as directed by MD   loratadine  (CLARITIN ) 10 MG tablet Take 10 mg by mouth daily as needed for allergies.   Magnesium 400 MG CAPS Take by  mouth daily.   meloxicam (MOBIC) 15 MG tablet Take 15 mg by mouth daily.   Multiple Vitamin (MULTIVITAMIN) tablet Take 1 tablet by mouth daily.   niacin (VITAMIN B3) 500 MG tablet Take 1,000 mg by mouth 2 (two) times daily.   ondansetron  (ZOFRAN -ODT) 4 MG disintegrating tablet DISSOLVE ONE TABLET ON TONGUE EVERY 8 HOURS AS NEEDED FOR NAUSEA / VOMITING   Povidone, PF, (IVIZIA DRY EYES) 0.5 % SOLN Place 1 drop into both eyes 2 (two) times daily.   Probiotic Product (VSL#3 PO) Take 1 capsule by mouth daily.   sertraline  (ZOLOFT ) 25 MG tablet Take 1 tablet (25 mg total) by mouth daily.   Simethicone (GAS-X PO) Take 2 tablets by mouth daily as needed (gas).   triamcinolone  cream (KENALOG ) 0.1 % Apply 1 Application topically as needed (psoriasis).    [DISCONTINUED] carbidopa -levodopa  (SINEMET  IR) 25-100 MG tablet TAKE ONE TABLET BY MOUTH 3 TIMES DAILY AT 8AM/NOON/4P     Objective:   PHYSICAL EXAMINATION:    VITALS:   Vitals:   03/27/24 1253  BP: 126/82  Pulse: 73  SpO2: 99%  Weight: 169 lb (76.7 kg)     GEN:  The patient appears stated age and is in NAD. HEENT:  Normocephalic, atraumatic.  The mucous membranes are moist. The superficial temporal arteries are without ropiness or tenderness. CV:  rrr with 2/6 SEM Lungs:  CTAB Neck:  no bruits  Neurological examination:  Orientation: The patient is alert and oriented x3. Cranial nerves: There is good facial symmetry with no significant facial hypomimia. The speech is fluent and clear. Soft palate rises symmetrically and there is no tongue deviation. Hearing is intact to conversational tone. Sensation: Sensation is intact to light touch throughout Motor: Strength is at least antigravity x4.  Movement examination: Tone: There is nl tone in the bilateral upper extremities.  The tone in the lower extremities is nl.  Abnormal movements: rare RUE rest tremor Coordination:  There is no decremation, with any form of RAMS, including alternating supination and pronation of the forearm, hand opening and closing, finger taps, heel taps and toe taps.  Gait and Station: The patient pushes off to arise.  She doesn't shuffle.  She is short stepped and a bit antalgic.  I have reviewed and interpreted the following labs independently    Chemistry      Component Value Date/Time   NA 142 11/25/2023 0847   K 3.6 11/25/2023 0847   CL 104 11/25/2023 0847   CO2 31 11/25/2023 0847   BUN 13 11/25/2023 0847   CREATININE 0.72 11/25/2023 0847      Component Value Date/Time   CALCIUM  9.2 11/25/2023 0847   ALKPHOS 85 11/25/2023 0847   AST 22 11/25/2023 0847   ALT 6 11/25/2023 0847   BILITOT 0.5 11/25/2023 0847       Lab Results  Component Value Date   WBC 5.2 02/25/2023   HGB 13.3  02/25/2023   HCT 40.8 02/25/2023   MCV 95.8 02/25/2023   PLT 216.0 02/25/2023    Lab Results  Component Value Date   TSH 1.38 07/04/2023      Cc:  Glendia Shad, MD

## 2024-03-27 ENCOUNTER — Encounter: Payer: Self-pay | Admitting: Neurology

## 2024-03-27 ENCOUNTER — Ambulatory Visit (INDEPENDENT_AMBULATORY_CARE_PROVIDER_SITE_OTHER): Payer: Medicare Other | Admitting: Neurology

## 2024-03-27 VITALS — BP 126/82 | HR 73 | Wt 169.0 lb

## 2024-03-27 DIAGNOSIS — R32 Unspecified urinary incontinence: Secondary | ICD-10-CM

## 2024-03-27 DIAGNOSIS — G20A1 Parkinson's disease without dyskinesia, without mention of fluctuations: Secondary | ICD-10-CM | POA: Diagnosis not present

## 2024-03-27 MED ORDER — CARBIDOPA-LEVODOPA 25-100 MG PO TABS: 1.0000 | ORAL_TABLET | Freq: Three times a day (TID) | ORAL | 1 refills | Status: AC

## 2024-03-27 NOTE — Patient Instructions (Signed)
 Register now!  We are planning a Parkinsons Disease educational symposium at [facility], [address] [place], [place], [place] [address] on September 19.  We will have a movement disorder physician expert from [employer] coming to speak and a caregiver speaker.  We will have a panel of experts that will show you who you may need on your team of people on your journey with Parkinsons.  I hope to see you there!  Use this QR code to register by scanning it with the camera app on your phone:      Need more help with registration?  Contact [email or web address]

## 2024-03-28 ENCOUNTER — Other Ambulatory Visit (INDEPENDENT_AMBULATORY_CARE_PROVIDER_SITE_OTHER)

## 2024-03-28 DIAGNOSIS — E7800 Pure hypercholesterolemia, unspecified: Secondary | ICD-10-CM

## 2024-03-28 DIAGNOSIS — D72829 Elevated white blood cell count, unspecified: Secondary | ICD-10-CM

## 2024-03-28 DIAGNOSIS — R739 Hyperglycemia, unspecified: Secondary | ICD-10-CM | POA: Diagnosis not present

## 2024-03-28 DIAGNOSIS — I10 Essential (primary) hypertension: Secondary | ICD-10-CM | POA: Diagnosis not present

## 2024-03-28 LAB — CBC WITH DIFFERENTIAL/PLATELET
Basophils Absolute: 0.1 K/uL (ref 0.0–0.1)
Basophils Relative: 1.2 % (ref 0.0–3.0)
Eosinophils Absolute: 0.3 K/uL (ref 0.0–0.7)
Eosinophils Relative: 5.5 % — ABNORMAL HIGH (ref 0.0–5.0)
HCT: 37.3 % (ref 36.0–46.0)
Hemoglobin: 12.4 g/dL (ref 12.0–15.0)
Lymphocytes Relative: 24.9 % (ref 12.0–46.0)
Lymphs Abs: 1.2 K/uL (ref 0.7–4.0)
MCHC: 33.2 g/dL (ref 30.0–36.0)
MCV: 90.9 fl (ref 78.0–100.0)
Monocytes Absolute: 0.6 K/uL (ref 0.1–1.0)
Monocytes Relative: 12.6 % — ABNORMAL HIGH (ref 3.0–12.0)
Neutro Abs: 2.7 K/uL (ref 1.4–7.7)
Neutrophils Relative %: 55.8 % (ref 43.0–77.0)
Platelets: 234 K/uL (ref 150.0–400.0)
RBC: 4.1 Mil/uL (ref 3.87–5.11)
RDW: 14.3 % (ref 11.5–15.5)
WBC: 4.9 K/uL (ref 4.0–10.5)

## 2024-03-28 LAB — HEPATIC FUNCTION PANEL
ALT: 6 U/L (ref 0–35)
AST: 23 U/L (ref 0–37)
Albumin: 3.9 g/dL (ref 3.5–5.2)
Alkaline Phosphatase: 80 U/L (ref 39–117)
Bilirubin, Direct: 0.1 mg/dL (ref 0.0–0.3)
Total Bilirubin: 0.4 mg/dL (ref 0.2–1.2)
Total Protein: 6.8 g/dL (ref 6.0–8.3)

## 2024-03-28 LAB — LIPID PANEL
Cholesterol: 196 mg/dL (ref 0–200)
HDL: 71.8 mg/dL (ref >39.00)
LDL Cholesterol: 103 mg/dL — ABNORMAL HIGH (ref 0–99)
NonHDL: 124.14
Total CHOL/HDL Ratio: 3
Triglycerides: 104 mg/dL (ref 0.0–149.0)
VLDL: 20.8 mg/dL (ref 0.0–40.0)

## 2024-03-28 LAB — HEMOGLOBIN A1C: Hgb A1c MFr Bld: 6 % (ref 4.6–6.5)

## 2024-03-28 LAB — BASIC METABOLIC PANEL WITH GFR
BUN: 12 mg/dL (ref 6–23)
CO2: 28 meq/L (ref 19–32)
Calcium: 8.9 mg/dL (ref 8.4–10.5)
Chloride: 104 meq/L (ref 96–112)
Creatinine, Ser: 0.66 mg/dL (ref 0.40–1.20)
GFR: 83.87 mL/min (ref >60.00)
Glucose, Bld: 107 mg/dL — ABNORMAL HIGH (ref 70–99)
Potassium: 3.8 meq/L (ref 3.5–5.1)
Sodium: 140 meq/L (ref 135–145)

## 2024-03-30 ENCOUNTER — Ambulatory Visit: Admitting: Internal Medicine

## 2024-03-30 VITALS — BP 118/72 | HR 80 | Resp 16 | Ht 62.5 in | Wt 168.0 lb

## 2024-03-30 DIAGNOSIS — R911 Solitary pulmonary nodule: Secondary | ICD-10-CM

## 2024-03-30 DIAGNOSIS — R739 Hyperglycemia, unspecified: Secondary | ICD-10-CM

## 2024-03-30 DIAGNOSIS — G20A1 Parkinson's disease without dyskinesia, without mention of fluctuations: Secondary | ICD-10-CM

## 2024-03-30 DIAGNOSIS — E7800 Pure hypercholesterolemia, unspecified: Secondary | ICD-10-CM | POA: Diagnosis not present

## 2024-03-30 DIAGNOSIS — Z853 Personal history of malignant neoplasm of breast: Secondary | ICD-10-CM | POA: Diagnosis not present

## 2024-03-30 DIAGNOSIS — I10 Essential (primary) hypertension: Secondary | ICD-10-CM

## 2024-03-30 DIAGNOSIS — M8580 Other specified disorders of bone density and structure, unspecified site: Secondary | ICD-10-CM

## 2024-03-30 DIAGNOSIS — D0512 Intraductal carcinoma in situ of left breast: Secondary | ICD-10-CM

## 2024-03-30 DIAGNOSIS — R195 Other fecal abnormalities: Secondary | ICD-10-CM

## 2024-03-30 MED ORDER — SERTRALINE HCL 25 MG PO TABS: 25.0000 mg | ORAL_TABLET | Freq: Every day | ORAL | 2 refills | Status: DC

## 2024-03-30 MED ORDER — EZETIMIBE 10 MG PO TABS: 10.0000 mg | ORAL_TABLET | Freq: Every day | ORAL | 2 refills | Status: DC

## 2024-03-30 MED ORDER — FLUTICASONE PROPIONATE 50 MCG/ACT NA SUSP: 2.0000 | NASAL | 3 refills | Status: DC | PRN

## 2024-03-30 NOTE — Progress Notes (Signed)
 Subjective:    Patient ID: Helen Marshall, female    DOB: 03-Sep-1945, 78 y.o.   MRN: [id number]  Patient here for  Chief Complaint  Patient presents with   Medical Management of Chronic Issues    HPI Here for a scheduled follow up - follow up regarding hypertension, hypercholesterolemia, hyperglycemia and parkinsons. Saw ortho 11/30/23 - OA right and left knee - recommended mobic. Had f/u 12/21/23 - recommended to continue mobic. Discussed monitoring for worsening GI symptoms. Reviewed recent labs. Had f/u with Dr Tat - 03/27/24 - continue sinemet . She had reported to Dr Tat - change in her gait. Felt not related to her parkinsons. Saw her chiropractor yesterday. Evaluated and gave her exercises. Reports gait is better. She continues her boxing. No chest pain or sob reported. Does report intermittent GI issues. Loose stool this week. Feels related to a tomato bake she ate. Feels better today. Discussed recent bone density and treatment options.    Past Medical History:  Diagnosis Date   Abnormal LFTs    Abnormal mammogram 12/19/2012   left   Allergy    Anemia    Arthritis    Cancer [facility]) 2014   left Breast   Carotid bruit    left   Cataract    Colon polyps    Diverticulosis 2012   Environmental allergies    Family history of adverse reaction to anesthesia    her aunt was hard to wake up   GERD (gastroesophageal reflux disease)    Heart murmur    History of diverticulitis 05/2021   Hypercholesterolemia    Hyperglycemia    Hypertension    IBS (irritable bowel syndrome)    Lipoma of colon    Lumbar disc disease    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   Pre-diabetes    Tubular adenoma of colon    Umbilical hernia 05/2021   Past Surgical History:  Procedure  Laterality Date   ABDOMINAL HYSTERECTOMY  1989   fibroids   ANKLE SURGERY Left 1999   ANTERIOR AND POSTERIOR REPAIR WITH SACROSPINOUS FIXATION N/A 07/20/2021   Procedure: ANTERIOR AND REPAIR WITH SACROSPINOUS FIXATION;  Surgeon: Marilynne Rosaline SAILOR, MD;  Location: [facility];  Service: Gynecology;  Laterality: N/A;   BREAST LUMPECTOMY Left 2014   breast ca radation no chemp per pt   BREAST SURGERY Left 1970   biopsy   CATARACT EXTRACTION, BILATERAL  June & July 2017   CHOLECYSTECTOMY  2001   COLONOSCOPY  06/02/2011   Deward Piedmont, MD; diverticulosis, submucosal lipoma of the sigmoid colon.   CYSTOSCOPY N/A 07/20/2021   Procedure: CYSTOSCOPY;  Surgeon: Marilynne Rosaline SAILOR, MD;  Location: [facility];  Service: Gynecology;  Laterality: N/A;   eyelid lift Bilateral    HERNIA REPAIR  2001, 2004   HERNIA REPAIR  01/22/2013   Repeat repair of umbilical defect, 2.5 cm. Primary repair.   INSERTION OF MESH N/A 03/31/2022   Procedure: INSERTION OF MESH;  Surgeon: Dessa Reyes LELON, MD;  Location: [facility];  Service: General;  Laterality: N/A;   LAPAROSCOPIC HYSTERECTOMY     LUMBAR LAMINECTOMY/DECOMPRESSION MICRODISCECTOMY Bilateral 06/18/2022   Procedure: Bilateral L3-4 Lumbar decompression;  Surgeon: Gillie Duncans, MD;  Location: [facility] OR;  Service: Neurosurgery;  Laterality: Bilateral;  NASAL SINUS SURGERY  1997   rotator cuff surgery  1998   SQUAMOUS CELL CARCINOMA EXCISION Right 05/2013   shoulder   TUBAL LIGATION  1976   UPPER GI ENDOSCOPY  08/22/2014   Dr JINNY. [facility]   VENTRAL HERNIA REPAIR N/A 03/31/2022   Procedure: HERNIA REPAIR VENTRAL ADULT;  Surgeon: Dessa Reyes ORN, MD;  Location: [facility];  Service: General;  Laterality: N/A;  Shelba Rakers, RNFA to assist TAPP block per anesthesia   Family History  Problem Relation Age of Onset   Heart disease Mother        myocardial infarction   Diabetes Mother    Hearing loss Mother    CVA Father     Alcohol  abuse Father    Arthritis/Rheumatoid Sister        x2   Breast cancer Sister 40   Arthritis Sister    Hearing loss Sister    Arthritis Sister    Hearing loss Sister    Asthma Sister    Arthritis Sister    Hearing loss Sister    Hypothyroidism Sister    Arthritis Sister    Hearing loss Sister    Breast cancer Sister 88   Colon polyps Sister    Arthritis Sister    Hearing loss Sister    Stroke Daughter    Colon cancer Maternal Uncle    Hypertension Brother        x2   Arthritis Brother    Hearing loss Brother    Alcohol  abuse Brother    Arthritis Brother    Hearing loss Brother    Colon polyps Son    Alcohol  abuse Son    Cancer Other        breast - paternal first cousin   Social History   Socioeconomic History   Marital status: Married    Spouse name: ,Vicenta   Number of children: 2   Years of education: Not on file   Highest education level: Associate degree: occupational, Scientist, product/process development, or vocational program  Occupational History   Occupation: Retired    Comment: Designer, 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  Tobacco Use   Smoking status: Never   Smokeless tobacco: Never  Vaping Use   Vaping status: Never Used  Substance and Sexual Activity   Alcohol  use: No    Alcohol /week: 0.0 standard drinks of alcohol    Drug use: No   Sexual activity: Not Currently  Other Topics Concern   Not on file  Social History Narrative   Right handed   Retired    Chief Executive Officer Drivers of Corporate investment banker Strain: Low Risk  (02/17/2024)   Overall Financial Resource Strain (CARDIA)    Difficulty of Paying Living Expenses: Not hard at all  Food Insecurity: No Food Insecurity (02/17/2024)   Hunger Vital Sign    Worried About Running Out of Food in the Last Year: Never true    Ran Out of Food in the Last Year: Never true  Transportation Needs: No Transportation Needs (02/17/2024)   PRAPARE - Administrator, Civil Service (Medical): No    Lack of Transportation (Non-Medical): No   Physical Activity: Sufficiently Active (02/17/2024)   Exercise Vital Sign    Days of Exercise per Week: 3 days    Minutes of Exercise per Session: 60 min  Stress: No Stress Concern Present (02/17/2024)   [employer] of Occupational Health - Occupational Stress Questionnaire    Feeling of Stress: Only a little  Social Connections:  Socially Integrated (02/17/2024)   Social Connection and Isolation Panel    Frequency of Communication with Friends and Family: More than three times a week    Frequency of Social Gatherings with Friends and Family: Once a week    Attends Religious Services: More than 4 times per year    Active Member of [employer] or Organizations: Yes    Attends Engineer, structural: More than 4 times per year    Marital Status: Married     Review of Systems  Constitutional:  Negative for appetite change and unexpected weight change.  HENT:  Negative for congestion and sinus pressure.   Respiratory:  Negative for cough, chest tightness and shortness of breath.   Cardiovascular:  Negative for chest pain and palpitations.       No increased swelling.   Gastrointestinal:  Negative for abdominal pain, diarrhea, nausea and vomiting.  Genitourinary:  Negative for difficulty urinating and dysuria.  Musculoskeletal:  Negative for joint swelling and myalgias.  Skin:  Negative for color change and rash.  Neurological:  Negative for dizziness and headaches.  Psychiatric/Behavioral:  Negative for agitation and dysphoric mood.        Objective:     BP 118/72   Pulse 80   Resp 16   Ht 5' 2.5 (1.588 m)   Wt 168 lb (76.2 kg)   LMP 07/21/1988   SpO2 98%   BMI 30.24 kg/m  Wt Readings from Last 3 Encounters:  03/30/24 168 lb (76.2 kg)  03/27/24 169 lb (76.7 kg)  02/20/24 165 lb (74.8 kg)    Physical Exam Vitals reviewed.  Constitutional:      General: She is not in acute distress.    Appearance: Normal appearance.  HENT:     Head: Normocephalic and  atraumatic.     Right Ear: External ear normal.     Left Ear: External ear normal.     Mouth/Throat:     Pharynx: No oropharyngeal exudate or posterior oropharyngeal erythema.  Eyes:     General: No scleral icterus.       Right eye: No discharge.        Left eye: No discharge.     Conjunctiva/sclera: Conjunctivae normal.  Neck:     Thyroid : No thyromegaly.  Cardiovascular:     Rate and Rhythm: Normal rate and regular rhythm.  Pulmonary:     Effort: No respiratory distress.     Breath sounds: Normal breath sounds. No wheezing.  Abdominal:     General: Bowel sounds are normal.     Palpations: Abdomen is soft.     Tenderness: There is no abdominal tenderness.  Musculoskeletal:        General: No swelling or tenderness.     Cervical back: Neck supple. No tenderness.  Lymphadenopathy:     Cervical: No cervical adenopathy.  Skin:    Findings: No erythema or rash.  Neurological:     Mental Status: She is alert.  Psychiatric:        Mood and Affect: Mood normal.        Behavior: Behavior normal.         Outpatient Encounter Medications as of 03/30/2024  Medication Sig   albuterol  (VENTOLIN  HFA) 108 (90 Base) MCG/ACT inhaler INHALE 2 PUFFS EVERY 6 HOURS AS NEEDED FOR WHEEZING OR SHORTNESS OF BREATH   amLODipine  (NORVASC ) 5 MG tablet TAKE 1 TABLET BY MOUTH DAILY   aspirin EC 325 MG tablet Take 325 mg by mouth  daily.   benazepril  (LOTENSIN ) 40 MG tablet Take 1 tablet (40 mg total) by mouth daily.   Calcium  Carb-Cholecalciferol  (CALCIUM  600 + D PO) Take 1 tablet by mouth daily.   carbidopa -levodopa  (SINEMET  IR) 25-100 MG tablet Take 1 tablet by mouth 3 (three) times daily.   ezetimibe  (ZETIA ) 10 MG tablet Take 1 tablet (10 mg total) by mouth daily.   fish oil-omega-3 fatty acids 1000 MG capsule Take 2 g by mouth 2 (two) times daily.   fluticasone  (FLONASE ) 50 MCG/ACT nasal spray Place 2 sprays into both nostrils as needed.   ibuprofen  (ADVIL ) 200 MG tablet Take 400 mg by mouth  every 6 (six) hours as needed for moderate pain.   lidocaine  (LIDODERM ) 5 % Place 1 patch onto the skin daily as needed (pain). Remove & Discard patch within 12 hours or as directed by MD   loratadine  (CLARITIN ) 10 MG tablet Take 10 mg by mouth daily as needed for allergies.   Magnesium 400 MG CAPS Take by mouth daily.   meloxicam (MOBIC) 15 MG tablet Take 15 mg by mouth daily.   Multiple Vitamin (MULTIVITAMIN) tablet Take 1 tablet by mouth daily.   niacin (VITAMIN B3) 500 MG tablet Take 1,000 mg by mouth 2 (two) times daily.   ondansetron  (ZOFRAN -ODT) 4 MG disintegrating tablet DISSOLVE ONE TABLET ON TONGUE EVERY 8 HOURS AS NEEDED FOR NAUSEA / VOMITING   Povidone, PF, (IVIZIA DRY EYES) 0.5 % SOLN Place 1 drop into both eyes 2 (two) times daily.   Probiotic Product (VSL#3 PO) Take 1 capsule by mouth daily.   sertraline  (ZOLOFT ) 25 MG tablet Take 1 tablet (25 mg total) by mouth daily.   Simethicone (GAS-X PO) Take 2 tablets by mouth daily as needed (gas).   triamcinolone  cream (KENALOG ) 0.1 % Apply 1 Application topically as needed (psoriasis).   [DISCONTINUED] ezetimibe  (ZETIA ) 10 MG tablet Take 1 tablet (10 mg total) by mouth daily.   [DISCONTINUED] fluticasone  (FLONASE ) 50 MCG/ACT nasal spray PLACE 2 SPRAYS INTO BOTH NOSTRILS AS NEEDED.   [DISCONTINUED] sertraline  (ZOLOFT ) 25 MG tablet Take 1 tablet (25 mg total) by mouth daily.   No facility-administered encounter medications on file as of 03/30/2024.     Lab Results  Component Value Date   WBC 4.9 03/28/2024   HGB 12.4 03/28/2024   HCT 37.3 03/28/2024   PLT 234.0 03/28/2024   GLUCOSE 107 (H) 03/28/2024   CHOL 196 03/28/2024   TRIG 104.0 03/28/2024   HDL 71.80 03/28/2024   LDLDIRECT 101.4 05/07/2013   LDLCALC 103 (H) 03/28/2024   ALT 6 03/28/2024   AST 23 03/28/2024   NA 140 03/28/2024   K 3.8 03/28/2024   CL 104 03/28/2024   CREATININE 0.66 03/28/2024   BUN 12 03/28/2024   CO2 28 03/28/2024   TSH 1.38 07/04/2023   HGBA1C  6.0 03/28/2024       Assessment & Plan:  History of breast cancer Assessment & Plan: Mammogram 01/05/23 - Birads I.    Hypercholesterolemia Assessment & Plan: The 10-year ASCVD risk score (Arnett DK, et al., 2019) is: 28.4%   Values used to calculate the score:     Age: 58 years     Clincally relevant sex: Female     Is Non-Hispanic African American: No     Diabetic: No     Tobacco smoker: No     Systolic Blood Pressure: 126 mmHg     Is BP treated: Yes     HDL Cholesterol:  71.8 mg/dL     Total Cholesterol: 196 mg/dL  Intolerant to statin.  On zetia . Continue diet and exercise.   Orders: -     Lipid panel; Future -     Hepatic function panel; Future -     TSH; Future  Hyperglycemia Assessment & Plan: Low carb diet and exercise. Follow met b and A1c.   Orders: -     Hemoglobin A1c; Future  Primary hypertension Assessment & Plan: Continue amlodipine , benazepril .  Blood pressure has been under good control on this regimen.  Follow pressures.  Follow metabolic panel.   Orders: -     Basic metabolic panel with GFR; Future  Lung nodule Assessment & Plan:  Incidental finding CT abdomen 04/26/21.  Had f/u chest CT 10/02/21 - stable 4mm right lower lobe pulmonary nodule.  F/u pulmonary 10/2023 - recommended f/u CT in one year.    Neoplasm of left breast, primary tumor 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) Assessment & Plan:  Was seeing Dr Dessa - f/u breast cancer. He retired. Recommended continued annual screening through PCP.    Osteopenia, unspecified location Assessment & Plan: Given 10 year risk of major osteoporotic fracture is >20%.  Unable to take fosamax.  Referred to endocrinology.  Since unable to tolerate fosamax, received reclast. Continue calcium , vitamin D  and weight bearing exercise.  Refer back to endocrinology for further treatment.   Orders: -     Ambulatory referral to Endocrinology  Parkinson's disease, unspecified whether dyskinesia  present, unspecified whether manifestations fluctuate [facility]) Assessment & Plan: Dr Tat 09/2022 - diagnosed.  Continues on sinemet . Boxing.  Continue f/u with Dr Tat.    Loose stools Assessment & Plan: Noted this week. Has stopped eating the tomato bake. Better. Today. Follow. Call with update.    Other orders -     Ezetimibe ; Take 1 tablet (10 mg total) by mouth daily.  Dispense: 90 tablet; Refill: 2 -     Sertraline  HCl; Take 1 tablet (25 mg total) by mouth daily.  Dispense: 30 tablet; Refill: 2 -     Fluticasone  Propionate; Place 2 sprays into both nostrils as needed.  Dispense: 16 g; Refill: 3     Allena Hamilton, MD

## 2024-03-30 NOTE — Assessment & Plan Note (Addendum)
 The 10-year ASCVD risk score (Arnett DK, et al., 2019) is: 28.4%   Values used to calculate the score:     Age: 78 years     Clincally relevant sex: Female     Is Non-Hispanic African American: No     Diabetic: No     Tobacco smoker: No     Systolic Blood Pressure: 126 mmHg     Is BP treated: Yes     HDL Cholesterol: 71.8 mg/dL     Total Cholesterol: 196 mg/dL  Intolerant to statin.  On zetia . Continue diet and exercise.

## 2024-04-01 ENCOUNTER — Encounter: Payer: Self-pay | Admitting: Internal Medicine

## 2024-04-01 DIAGNOSIS — R195 Other fecal abnormalities: Secondary | ICD-10-CM | POA: Insufficient documentation

## 2024-04-01 NOTE — Assessment & Plan Note (Signed)
Continue amlodipine, benazepril.  Blood pressure has been under good control on this regimen.  Follow pressures.  Follow metabolic panel.   

## 2024-04-01 NOTE — Assessment & Plan Note (Signed)
Mammogram 01/05/23 - Birads I.

## 2024-04-01 NOTE — Assessment & Plan Note (Signed)
 Low-carb diet and exercise.  Follow met b and A1c.

## 2024-04-01 NOTE — Assessment & Plan Note (Signed)
 Dr Tat 09/2022 - diagnosed.  Continues on sinemet . Boxing.  Continue f/u with Dr Tat.

## 2024-04-01 NOTE — Assessment & Plan Note (Signed)
 Incidental finding CT abdomen 04/26/21.  Had f/u chest CT 10/02/21 - stable 4mm right lower lobe pulmonary nodule.  F/u pulmonary 10/2023 - recommended f/u CT in one year.

## 2024-04-01 NOTE — Assessment & Plan Note (Signed)
 Noted this week. Has stopped eating the tomato bake. Better. Today. Follow. Call with update.

## 2024-04-01 NOTE — Assessment & Plan Note (Signed)
 Was seeing Dr Lemar Livings - f/u breast cancer. He retired. Recommended continued annual screening through PCP.

## 2024-04-01 NOTE — Assessment & Plan Note (Signed)
 Given 10 year risk of major osteoporotic fracture is >20%.  Unable to take fosamax.  Referred to endocrinology.  Since unable to tolerate fosamax, received reclast. Continue calcium , vitamin D  and weight bearing exercise.  Refer back to endocrinology for further treatment.

## 2024-04-12 NOTE — Progress Notes (Unsigned)
 Ellouise Console, PA-C [address] [place], [place]  [address] Phone: [phone]   Primary Care Physician: Glendia Shad, MD  Primary Gastroenterologist:  Ellouise Console, PA-C / Dr. Gordy Starch   Chief Complaint: Chronic abdominal bloating, GERD       HPI:   Helen Marshall is a 78 y.o. female, established patient Dr. Starch, presents for abdominal bloating and GERD.  No current treatment for acid reflux.  Current symptoms: Patient has increased belching, acid coming up into her throat, and upper abdominal bloating.  It is worse at nighttime when she lays down.  She has not been sleeping well due to her upper GI symptoms.  She sits in a recliner.  She drinks buttermilk and eats yogurt with little benefit.  She has chronic constipation.  Currently takes 400 mg magnesium daily and Benefiber daily to treat constipation with little benefit.  She can go several days with no bowel movement.  Her upper abdomen feels very full and bloated diffusely.  She denies any lower abdominal pain.  Previously tried Pepcid  with no benefit.  06/2021 last colonoscopy by Dr. Starch: 2 small (2 mm to 4 mm) tubular adenoma polyps removed.  Sigmoid diverticulosis.  No repeat due to advanced age.  03/2016 colonoscopy: 2 small tubular adenomas and 1 hyperplastic polyp removed.  Sigmoid diverticulosis and external hemorrhoids.  08/2014 EGD (for dyspepsia and abdominal bloating): Mild gastropathy.  Normal esophagus and duodenum.  Biopsy showed mild chronic gastritis.  Negative for H. pylori.  PMH: Parkinson's disease, neuropathy, GERD, adenomatous colon polyps, diverticulosis, hypertension.  Treated for sigmoid diverticulitis in 2022 confirmed on CT.  03/28/2024 labs: Normal CBC, BMP, and hepatic panel.  A1c 6.0.   Current Outpatient Medications  Medication Sig Dispense Refill   albuterol  (VENTOLIN  HFA) 108 (90 Base) MCG/ACT inhaler INHALE 2 PUFFS EVERY 6 HOURS AS NEEDED FOR WHEEZING OR SHORTNESS OF BREATH 18  g 1   amLODipine  (NORVASC ) 5 MG tablet TAKE 1 TABLET BY MOUTH DAILY 90 tablet 3   aspirin EC 325 MG tablet Take 325 mg by mouth daily.     benazepril  (LOTENSIN ) 40 MG tablet Take 1 tablet (40 mg total) by mouth daily. 90 tablet 3   Calcium  Carb-Cholecalciferol  (CALCIUM  600 + D PO) Take 1 tablet by mouth daily.     carbidopa -levodopa  (SINEMET  IR) 25-100 MG tablet Take 1 tablet by mouth 3 (three) times daily. 270 tablet 1   ezetimibe  (ZETIA ) 10 MG tablet Take 1 tablet (10 mg total) by mouth daily. 90 tablet 2   fish oil-omega-3 fatty acids 1000 MG capsule Take 2 g by mouth 2 (two) times daily.     fluticasone  (FLONASE ) 50 MCG/ACT nasal spray Place 2 sprays into both nostrils as needed. 16 g 3   ibuprofen  (ADVIL ) 200 MG tablet Take 400 mg by mouth every 6 (six) hours as needed for moderate pain.     lidocaine  (LIDODERM ) 5 % Place 1 patch onto the skin daily as needed (pain). Remove & Discard patch within 12 hours or as directed by MD     loratadine  (CLARITIN ) 10 MG tablet Take 10 mg by mouth daily as needed for allergies.     Magnesium 400 MG CAPS Take by mouth daily.     meloxicam (MOBIC) 15 MG tablet Take 15 mg by mouth daily.     Multiple Vitamin (MULTIVITAMIN) tablet Take 1 tablet by mouth daily.     niacin (VITAMIN B3) 500 MG tablet Take  1,000 mg by mouth 2 (two) times daily.     ondansetron  (ZOFRAN -ODT) 4 MG disintegrating tablet DISSOLVE ONE TABLET ON TONGUE EVERY 8 HOURS AS NEEDED FOR NAUSEA / VOMITING 20 tablet 0   Povidone, PF, (IVIZIA DRY EYES) 0.5 % SOLN Place 1 drop into both eyes 2 (two) times daily.     Probiotic Product (VSL#3 PO) Take 1 capsule by mouth daily.     sertraline  (ZOLOFT ) 25 MG tablet Take 1 tablet (25 mg total) by mouth daily. 30 tablet 2   Simethicone (GAS-X PO) Take 2 tablets by mouth daily as needed (gas).     triamcinolone  cream (KENALOG ) 0.1 % Apply 1 Application topically as needed (psoriasis). 30 g 0   No current facility-administered medications for this  visit.    Allergies as of 04/13/2024 - Review Complete 04/13/2024  Allergen Reaction Noted   Bactrim  [sulfamethoxazole -trimethoprim ] Nausea Only 07/04/2023   Crestor [rosuvastatin] Other (See Comments) 07/21/2012   Demerol [meperidine] Nausea And Vomiting 07/21/2012   Latex Other (See Comments) 07/21/2012   Lipitor [atorvastatin] Other (See Comments) 07/21/2012   Lovastatin Other (See Comments) 07/21/2012   Oxycodone  Nausea And Vomiting 06/17/2022   Pravastatin Other (See Comments) 07/21/2012   Tramadol  Nausea Only 02/06/2013   Vicodin [hydrocodone-acetaminophen ] Other (See Comments) 07/21/2012   Zocor [simvastatin] Other (See Comments) 07/21/2012   Gabapentin  Rash 07/31/2018    Past Medical History:  Diagnosis Date   Abnormal LFTs    Abnormal mammogram 12/19/2012   left   Allergy    Anemia    Arthritis    Cancer (HCC) 2014   left Breast   Carotid bruit    left   Cataract    Colon polyps    Diverticulosis 2012   Environmental allergies    Family history of adverse reaction to anesthesia    her aunt was hard to wake up   GERD (gastroesophageal reflux disease)    Heart murmur    History of diverticulitis 05/2021   Hypercholesterolemia    Hyperglycemia    Hypertension    IBS (irritable bowel syndrome)    Lipoma of colon    Lumbar disc disease    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   Pre-diabetes    Tubular adenoma of colon    Umbilical hernia 05/2021    Past Surgical History:  Procedure Laterality Date   ABDOMINAL HYSTERECTOMY  1989   fibroids   ANKLE SURGERY Left 1999   ANTERIOR AND POSTERIOR REPAIR WITH SACROSPINOUS FIXATION N/A 07/20/2021   Procedure: ANTERIOR AND REPAIR WITH SACROSPINOUS FIXATION;  Surgeon: Marilynne Rosaline SAILOR, MD;  Location:  [facility];  Service: Gynecology;  Laterality: N/A;   BREAST LUMPECTOMY Left 2014   breast ca radation no chemp per pt   BREAST SURGERY Left 1970   biopsy   CATARACT EXTRACTION, BILATERAL  June & July 2017   CHOLECYSTECTOMY  2001   COLONOSCOPY  06/02/2011   Deward Piedmont, MD; diverticulosis, submucosal lipoma of the sigmoid colon.   CYSTOSCOPY N/A 07/20/2021   Procedure: CYSTOSCOPY;  Surgeon: Marilynne Rosaline SAILOR, MD;  Location: [facility];  Service: Gynecology;  Laterality: N/A;   eyelid lift Bilateral    HERNIA REPAIR  2001, 2004   HERNIA REPAIR  01/22/2013   Repeat repair of umbilical defect,  2.5 cm. Primary repair.   INSERTION OF MESH N/A 03/31/2022   Procedure: INSERTION OF MESH;  Surgeon: Dessa Reyes ORN, MD;  Location: [facility];  Service: General;  Laterality: N/A;   LAPAROSCOPIC HYSTERECTOMY     LUMBAR LAMINECTOMY/DECOMPRESSION MICRODISCECTOMY Bilateral 06/18/2022   Procedure: Bilateral L3-4 Lumbar decompression;  Surgeon: Gillie Duncans, MD;  Location: [facility];  Service: Neurosurgery;  Laterality: Bilateral;   NASAL SINUS SURGERY  1997   rotator cuff surgery  1998   SQUAMOUS CELL CARCINOMA EXCISION Right 05/2013   shoulder   TUBAL LIGATION  1976   UPPER GI ENDOSCOPY  08/22/2014   Dr JINNY. Albertus   VENTRAL HERNIA REPAIR N/A 03/31/2022   Procedure: HERNIA REPAIR VENTRAL ADULT;  Surgeon: Dessa Reyes ORN, MD;  Location: [facility];  Service: General;  Laterality: N/A;  Shelba Rakers, RNFA to assist TAPP block per anesthesia    Review of Systems:    All systems reviewed and negative except where noted in HPI.    Physical Exam:  BP 118/72   Pulse 78   Ht 5' 2 (1.575 m)   Wt 169 lb 4 oz (76.8 kg)   LMP 07/21/1988   SpO2 96%   BMI 30.96 kg/m  Patient's last menstrual period was 07/21/1988.  General: Well-nourished, well-developed in no acute distress.  Lungs: Clear to auscultation bilaterally. Non-labored. Heart: Regular rate and rhythm, no  murmurs rubs or gallops.  Abdomen: Bowel sounds are normal; Abdomen is Soft and obese; No hepatosplenomegaly, masses or hernias;  Mild generalized upper Abdominal Tenderness; No lower abdominal tenderness.  No guarding or rebound tenderness. Neuro: Alert and oriented x 3.  Grossly intact.  Psych: Alert and cooperative, normal mood and affect.  Imaging Studies: No results found.  Labs: CBC    Component Value Date/Time   WBC 4.9 03/28/2024 0933   RBC 4.10 03/28/2024 0933   HGB 12.4 03/28/2024 0933   HCT 37.3 03/28/2024 0933   PLT 234.0 03/28/2024 0933   MCV 90.9 03/28/2024 0933   MCH 31.8 06/19/2022 0719   MCHC 33.2 03/28/2024 0933   RDW 14.3 03/28/2024 0933   LYMPHSABS 1.2 03/28/2024 0933   MONOABS 0.6 03/28/2024 0933   EOSABS 0.3 03/28/2024 0933   BASOSABS 0.1 03/28/2024 0933    CMP     Component Value Date/Time   NA 140 03/28/2024 0933   K 3.8 03/28/2024 0933   CL 104 03/28/2024 0933   CO2 28 03/28/2024 0933   GLUCOSE 107 (H) 03/28/2024 0933   BUN 12 03/28/2024 0933   CREATININE 0.66 03/28/2024 0933   CALCIUM  8.9 03/28/2024 0933   PROT 6.8 03/28/2024 0933   ALBUMIN 3.9 03/28/2024 0933   AST 23 03/28/2024 0933   ALT 6 03/28/2024 0933   ALKPHOS 80 03/28/2024 0933   BILITOT 0.4 03/28/2024 0933   GFRNONAA >60 06/19/2022 0719       Assessment and Plan:   LEIGH BLAS is a 78 y.o. y/o female returns for follow-up of chronic GI symptoms.  Symptoms are most consistent with acid reflux and chronic constipation/IBS with constipation.  No current treatment for GERD or constipation.  1.  Abdominal bloating: Chronic x many years - suspect due to IBS-C - Gave samples of Linzess 72 mcg QD for 1 week, then 145 mcg QD for 1 week. Patient will let me know which dose works best, and then we can send a prescription.   - Low FODMAP diet given  2. IBS-C - Gave samples of Linzess  72 mcg QD for 1 week, then 145 mcg QD for 1 week. Patient will let me know which dose works best,  and then we can send a prescription.   - Low FODMAP diet given  3.  GERD - Start Pantoprazole  40mg  1 tablet once daily. - Recommend Lifestyle Modifications to prevent Acid Reflux.  Rec. Avoid coffee, sodas, peppermint, garlic, onions, alcohol , citrus fruits, chocolate, tomatoes, fatty and spicey foods.  Avoid eating 2-3 hours before bedtime.    4. Hx Colon Polyps:  06/2021 last colonoscopy by Dr. Albertus: 2 small (2 mm to 4 mm) tubular adenoma polyps removed.  Sigmoid diverticulosis.   - No repeat Colonoscopy due to advanced age.  Ellouise Console, PA-C  Follow up 3 months with TG for GERD and IBS-C

## 2024-04-13 ENCOUNTER — Ambulatory Visit: Admitting: Physician Assistant

## 2024-04-13 ENCOUNTER — Encounter: Payer: Self-pay | Admitting: Physician Assistant

## 2024-04-13 ENCOUNTER — Encounter: Payer: Self-pay | Admitting: Internal Medicine

## 2024-04-13 VITALS — BP 118/72 | HR 78 | Ht 62.0 in | Wt 169.2 lb

## 2024-04-13 DIAGNOSIS — K5904 Chronic idiopathic constipation: Secondary | ICD-10-CM

## 2024-04-13 DIAGNOSIS — K219 Gastro-esophageal reflux disease without esophagitis: Secondary | ICD-10-CM

## 2024-04-13 DIAGNOSIS — R140 Abdominal distension (gaseous): Secondary | ICD-10-CM | POA: Diagnosis not present

## 2024-04-13 DIAGNOSIS — Z860101 Personal history of adenomatous and serrated colon polyps: Secondary | ICD-10-CM | POA: Diagnosis not present

## 2024-04-13 DIAGNOSIS — K581 Irritable bowel syndrome with constipation: Secondary | ICD-10-CM | POA: Diagnosis not present

## 2024-04-13 DIAGNOSIS — Z860102 Personal history of hyperplastic colon polyps: Secondary | ICD-10-CM

## 2024-04-13 MED ORDER — PANTOPRAZOLE SODIUM 40 MG PO TBEC: 40.0000 mg | DELAYED_RELEASE_TABLET | Freq: Every day | ORAL | 3 refills | Status: AC

## 2024-04-13 NOTE — Patient Instructions (Signed)
 We have sent the following medications to your pharmacy for you to pick up at your convenience: Pantoprazole  40 mg once daily  We have given you samples of the following medication to take: Linzess 72 mcg and Linzess 145 mcg take as directed  Please follow up sooner if symptoms increase or worsen  Due to recent changes in healthcare laws, you may see the results of your imaging and laboratory studies on MyChart before your provider has had a chance to review them.  We understand that in some cases there may be results that are confusing or concerning to you. Not all laboratory results come back in the same time frame and the provider may be waiting for multiple results in order to interpret others.  Please give us  48 hours in order for your provider to thoroughly review all the results before contacting the office for clarification of your results.   Thank you for trusting me with your gastrointestinal care!   Ellouise Console, PA-C _______________________________________________________  If your blood pressure at your visit was 140/90 or greater, please contact your primary care physician to follow up on this.  _______________________________________________________  If you are age 79 or older, your body mass index should be between 23-30. Your Body mass index is 30.96 kg/m. If this is out of the aforementioned range listed, please consider follow up with your Primary Care Provider.  If you are age 79 or younger, your body mass index should be between 19-25. Your Body mass index is 30.96 kg/m. If this is out of the aformentioned range listed, please consider follow up with your Primary Care Provider.   ________________________________________________________  The  GI providers would like to encourage you to use MYCHART to communicate with providers for non-urgent requests or questions.  Due to long hold times on the telephone, sending your provider a message by [facility] may be a faster  and more efficient way to get a response.  Please allow 48 business hours for a response.  Please remember that this is for non-urgent requests.  _______________________________________________________

## 2024-04-23 ENCOUNTER — Telehealth: Payer: Self-pay | Admitting: Physician Assistant

## 2024-04-23 DIAGNOSIS — K5904 Chronic idiopathic constipation: Secondary | ICD-10-CM

## 2024-04-23 DIAGNOSIS — K581 Irritable bowel syndrome with constipation: Secondary | ICD-10-CM

## 2024-04-23 NOTE — Telephone Encounter (Signed)
 I spoke with the patient and she states that she tried the Linzess  72 mcg and it worked well and made her have a normal bm but Saturday it made her have complete blowout. She states that she did not try the 145 mcg and would like to know what she should do going forward.

## 2024-04-23 NOTE — Telephone Encounter (Signed)
 PT is calling to further discuss her medications. Please advise.

## 2024-04-24 MED ORDER — LINACLOTIDE 72 MCG PO CAPS: 72.0000 ug | ORAL_CAPSULE | Freq: Every day | ORAL | 5 refills | Status: DC

## 2024-04-24 NOTE — Addendum Note (Signed)
 Addended by: LANETTE ALETHEA CROME on: 04/24/2024 01:53 PM   Modules accepted: Orders

## 2024-05-26 ENCOUNTER — Encounter: Payer: Self-pay | Admitting: Internal Medicine

## 2024-05-26 DIAGNOSIS — M810 Age-related osteoporosis without current pathological fracture: Secondary | ICD-10-CM | POA: Insufficient documentation

## 2024-05-29 ENCOUNTER — Other Ambulatory Visit: Payer: Self-pay | Admitting: Medical Genetics

## 2024-05-29 DIAGNOSIS — Z006 Encounter for examination for normal comparison and control in clinical research program: Secondary | ICD-10-CM

## 2024-06-21 ENCOUNTER — Telehealth: Payer: Self-pay | Admitting: Physician Assistant

## 2024-06-21 NOTE — Telephone Encounter (Signed)
 I have spoken to patient to advise of Tina's recommendations as noted. Patient verbalizes understanding and will reach out to us  again if these changes are not helpful.

## 2024-06-21 NOTE — Telephone Encounter (Signed)
 Inbound call from patient stating that she was put on Linzess . If she doesn't take the medication she has constipation and if she does, she is in the bathroom all day with diarrhea. Patient is requesting a call back to discuss if there is a different alternative. Please advise.

## 2024-06-21 NOTE — Telephone Encounter (Signed)
 Patient states that she has been taking Linzess  for a couple of months at 72 mcg. States if she does not take this, she feels constpiated and bloated. However, if she does take it, she has multiple diarrheal bowel movements daily. She did decrease recently to every other day dosing of Linzess  but notes same continued symptoms.   She would like a recommendation on something that may be a little easier for her to take. I did ask if she has ever tried Miralax  daily. She thinks she has but does not fully remember. She states that she has benefiber at home and will start taking this again as well.  Please advise.

## 2024-07-02 LAB — GENECONNECT MOLECULAR SCREEN: Genetic Analysis Overall Interpretation: NEGATIVE

## 2024-07-03 ENCOUNTER — Encounter: Payer: Self-pay | Admitting: Pharmacist

## 2024-07-03 NOTE — Progress Notes (Signed)
 Pharmacy Quality Measure Review  This patient is appearing on a report for being at risk of failing the adherence measure for hypertension (ACEi/ARB) medications this calendar year.   Medication: benazepril  Last fill date: 03/19/24 x90 ds.   Insurance report was not up to date. No action needed at this time.  Medication refilled as of 06/21/24 for 90 day supply. 89%.

## 2024-07-05 ENCOUNTER — Encounter: Payer: Self-pay | Admitting: General Surgery

## 2024-07-31 ENCOUNTER — Other Ambulatory Visit: Payer: Self-pay | Admitting: Internal Medicine

## 2024-08-08 ENCOUNTER — Other Ambulatory Visit (INDEPENDENT_AMBULATORY_CARE_PROVIDER_SITE_OTHER)

## 2024-08-08 DIAGNOSIS — R739 Hyperglycemia, unspecified: Secondary | ICD-10-CM | POA: Diagnosis not present

## 2024-08-08 DIAGNOSIS — E7800 Pure hypercholesterolemia, unspecified: Secondary | ICD-10-CM | POA: Diagnosis not present

## 2024-08-08 DIAGNOSIS — I10 Essential (primary) hypertension: Secondary | ICD-10-CM | POA: Diagnosis not present

## 2024-08-08 LAB — HEPATIC FUNCTION PANEL
ALT: 13 U/L (ref 3–35)
AST: 54 U/L — ABNORMAL HIGH (ref 5–37)
Albumin: 4.2 g/dL (ref 3.5–5.2)
Alkaline Phosphatase: 220 U/L — ABNORMAL HIGH (ref 39–117)
Bilirubin, Direct: 0.1 mg/dL (ref 0.1–0.3)
Total Bilirubin: 0.5 mg/dL (ref 0.2–1.2)
Total Protein: 6.6 g/dL (ref 6.0–8.3)

## 2024-08-08 LAB — BASIC METABOLIC PANEL WITH GFR
BUN: 13 mg/dL (ref 6–23)
CO2: 29 meq/L (ref 19–32)
Calcium: 9.3 mg/dL (ref 8.4–10.5)
Chloride: 105 meq/L (ref 96–112)
Creatinine, Ser: 0.74 mg/dL (ref 0.40–1.20)
GFR: 77.16 mL/min
Glucose, Bld: 109 mg/dL — ABNORMAL HIGH (ref 70–99)
Potassium: 3.7 meq/L (ref 3.5–5.1)
Sodium: 141 meq/L (ref 135–145)

## 2024-08-08 LAB — LIPID PANEL
Cholesterol: 203 mg/dL — ABNORMAL HIGH (ref 28–200)
HDL: 95.4 mg/dL
LDL Cholesterol: 96 mg/dL (ref 10–99)
NonHDL: 107.52
Total CHOL/HDL Ratio: 2
Triglycerides: 59 mg/dL (ref 10.0–149.0)
VLDL: 11.8 mg/dL (ref 0.0–40.0)

## 2024-08-08 LAB — TSH: TSH: 1.95 u[IU]/mL (ref 0.35–5.50)

## 2024-08-08 LAB — HEMOGLOBIN A1C: Hgb A1c MFr Bld: 5.8 % (ref 4.6–6.5)

## 2024-08-09 ENCOUNTER — Ambulatory Visit: Payer: Self-pay | Admitting: Internal Medicine

## 2024-08-10 ENCOUNTER — Ambulatory Visit: Admitting: Internal Medicine

## 2024-08-10 ENCOUNTER — Encounter: Payer: Self-pay | Admitting: Internal Medicine

## 2024-08-10 VITALS — BP 132/78 | HR 80 | Temp 97.8°F | Ht 62.0 in | Wt 166.0 lb

## 2024-08-10 DIAGNOSIS — D0512 Intraductal carcinoma in situ of left breast: Secondary | ICD-10-CM | POA: Diagnosis not present

## 2024-08-10 DIAGNOSIS — I10 Essential (primary) hypertension: Secondary | ICD-10-CM | POA: Diagnosis not present

## 2024-08-10 DIAGNOSIS — D72829 Elevated white blood cell count, unspecified: Secondary | ICD-10-CM

## 2024-08-10 DIAGNOSIS — E7800 Pure hypercholesterolemia, unspecified: Secondary | ICD-10-CM

## 2024-08-10 DIAGNOSIS — G20A1 Parkinson's disease without dyskinesia, without mention of fluctuations: Secondary | ICD-10-CM

## 2024-08-10 DIAGNOSIS — K219 Gastro-esophageal reflux disease without esophagitis: Secondary | ICD-10-CM

## 2024-08-10 DIAGNOSIS — R911 Solitary pulmonary nodule: Secondary | ICD-10-CM

## 2024-08-10 DIAGNOSIS — M810 Age-related osteoporosis without current pathological fracture: Secondary | ICD-10-CM | POA: Diagnosis not present

## 2024-08-10 DIAGNOSIS — R7989 Other specified abnormal findings of blood chemistry: Secondary | ICD-10-CM

## 2024-08-10 DIAGNOSIS — R739 Hyperglycemia, unspecified: Secondary | ICD-10-CM | POA: Diagnosis not present

## 2024-08-10 LAB — HEPATIC FUNCTION PANEL
ALT: 25 U/L (ref 3–35)
AST: 44 U/L — ABNORMAL HIGH (ref 5–37)
Albumin: 4.2 g/dL (ref 3.5–5.2)
Alkaline Phosphatase: 204 U/L — ABNORMAL HIGH (ref 39–117)
Bilirubin, Direct: 0.1 mg/dL (ref 0.1–0.3)
Total Bilirubin: 0.4 mg/dL (ref 0.2–1.2)
Total Protein: 6.6 g/dL (ref 6.0–8.3)

## 2024-08-10 LAB — GAMMA GT: GGT: 177 U/L — ABNORMAL HIGH (ref 7–51)

## 2024-08-10 MED ORDER — EZETIMIBE 10 MG PO TABS: 10.0000 mg | ORAL_TABLET | Freq: Every day | ORAL | 1 refills | Status: AC

## 2024-08-10 MED ORDER — SERTRALINE HCL 25 MG PO TABS: 25.0000 mg | ORAL_TABLET | Freq: Every day | ORAL | 1 refills | Status: AC

## 2024-08-10 NOTE — Progress Notes (Addendum)
 "  Subjective:    Patient ID: Helen Marshall, female    DOB: [birth year], 79 y.o.   MRN: [id number]  Patient here for  Chief Complaint  Patient presents with   Medical Management of Chronic Issues    HPI Here for a scheduled follow up - follow up regarding hypertension, hypercholesterolemia, hyperglycemia and parkinsons. Saw ortho 11/30/23 - OA right and left knee - recommended mobic. Had f/u 12/21/23 - recommended to continue mobic. Discussed monitoring for worsening GI symptoms. Reviewed recent labs. Had f/u with Dr Tat - 03/27/24 - continue sinemet . Saw GI 04/13/24 - abdominal bloating - suspected due to IBS-C. Trial of linzess . She is off linzess . States bowels are controlled with benefiber and miralax . Low FODMAP diet. Recommended to start protonix . Saw endocrinology 05/25/24 - recommended reclast for 3-4 years - then holiday. DEXA due 12/2025. Received reclast 06/13/24. Evaluated podiatry 08/08/24 - right achilles tendinitis. Treated - medrol dose pak. Breathing stable.    Past Medical History:  Diagnosis Date   Abnormal LFTs    Abnormal mammogram 12/19/2012   left   Allergy    Anemia    Arthritis    Cancer [facility]) January 22, 2013   left Breast   Carotid bruit    left   Cataract Had removed 2 years ago   Colon polyps    Diverticulosis 2012   Environmental allergies    Family history of adverse reaction to anesthesia    her aunt was hard to wake up   GERD (gastroesophageal reflux disease)    Heart murmur    History of diverticulitis 05/2021   Hypercholesterolemia    Hyperglycemia    Hypertension    IBS (irritable bowel syndrome)    Lipoma of colon    Lumbar disc disease    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   Pre-diabetes    Tubular adenoma of colon    Umbilical  hernia 05/2021   Past Surgical History:  Procedure Laterality Date   ABDOMINAL HYSTERECTOMY  1989   fibroids   ANKLE SURGERY Left 1999   ANTERIOR AND POSTERIOR REPAIR WITH SACROSPINOUS FIXATION N/A 07/20/2021   Procedure: ANTERIOR AND REPAIR WITH SACROSPINOUS FIXATION;  Surgeon: Marilynne Rosaline SAILOR, MD;  Location: [facility];  Service: Gynecology;  Laterality: N/A;   BREAST LUMPECTOMY Left 2014   breast ca radation no chemp per pt   BREAST SURGERY Left 1970 & 2914   biopsy   CATARACT EXTRACTION, BILATERAL  June & July 2017   CHOLECYSTECTOMY  2001   COLONOSCOPY  06/02/2011   [facility], MD; diverticulosis, submucosal lipoma of the sigmoid colon.   CYSTOSCOPY N/A 07/20/2021   Procedure: CYSTOSCOPY;  Surgeon: Marilynne Rosaline SAILOR, MD;  Location: [facility];  Service: Gynecology;  Laterality: N/A;   EYE SURGERY  Cataracts   eyelid lift Bilateral    FRACTURE SURGERY  Broke both bones in left ankle 2000   HERNIA REPAIR  2001, 2004   HERNIA REPAIR  01/22/2013   Repeat repair of umbilical defect, 2.5 cm. Primary repair.   INSERTION OF MESH N/A 03/31/2022   Procedure: INSERTION OF MESH;  Surgeon: Dessa Reyes LELON, MD;  Location: [facility];  Service: General;  Laterality: N/A;   LAPAROSCOPIC HYSTERECTOMY     LUMBAR LAMINECTOMY/DECOMPRESSION MICRODISCECTOMY Bilateral  06/18/2022   Procedure: Bilateral L3-4 Lumbar decompression;  Surgeon: Gillie Duncans, MD;  Location[facility];  Service: Neurosurgery;  Laterality: Bilateral;   NASAL SINUS SURGERY  1997   rotator cuff surgery  1998   SQUAMOUS CELL CARCINOMA EXCISION Right 05/2013   shoulder   TUBAL LIGATION  1976   UPPER GI ENDOSCOPY  08/22/2014   Dr JINNY. Albertus   VENTRAL HERNIA REPAIR N/A 03/31/2022   Procedure: HERNIA REPAIR VENTRAL ADULT;  Surgeon: Dessa Reyes ORN, MD;  Location: [facility];  Service: General;  Laterality: N/A;  Shelba Rakers, RNFA to assist TAPP block per anesthesia   Family History   Problem Relation Age of Onset   Heart disease Mother        myocardial infarction   Diabetes Mother    Hearing loss Mother    CVA Father    Alcohol  abuse Father    Stroke Father    Arthritis/Rheumatoid Sister        x2   Breast cancer Sister 31   Arthritis Sister    Hearing loss Sister    Hypertension Sister    Varicose Veins Sister    Arthritis Sister    Hearing loss Sister    Fibromyalgia Sister    Hypertension Sister    Asthma Sister    Arthritis Sister    Hearing loss Sister    Diabetes Sister    Heart disease Sister    Hypertension Sister    Obesity Sister    Hypothyroidism Sister    Arthritis Sister    Hearing loss Sister    Cancer Sister    Heart disease Sister    Hypertension Sister    Kidney disease Sister    Breast cancer Sister 31   Colon polyps Sister    Arthritis Sister    Hearing loss Sister    Asthma Sister    Stroke Sister    Varicose Veins Sister    Hypertension Brother        x2   Arthritis Brother    Hearing loss Brother    Kidney disease Brother    Obesity Brother    Alcohol  abuse Brother    Arthritis Brother    Hearing loss Brother    Stroke Daughter    Colon polyps Son    Alcohol  abuse Son    Colon cancer Maternal Uncle    Cancer Other        breast - paternal first cousin   Social History   Socioeconomic History   Marital status: Married    Spouse name: ,Vicenta   Number of children: 2   Years of education: Not on file   Highest education level: Associate degree: occupational, scientist, product/process development, or vocational program  Occupational History   Occupation: Retired    Comment: designer, 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  Tobacco Use   Smoking status: Never   Smokeless tobacco: Never  Vaping Use   Vaping status: Never Used  Substance and Sexual Activity   Alcohol  use: No    Alcohol /week: 0.0 standard drinks of alcohol    Drug use: No   Sexual activity: Not Currently  Other Topics Concern   Not on file  Social History Narrative   Right handed    Retired    Social Drivers of Health   Tobacco Use: Low Risk (08/18/2024)   Patient History    Smoking Tobacco Use: Never    Smokeless Tobacco Use: Never    Passive Exposure: Not on file  Financial Resource  Strain: Low Risk  (08/16/2024)   Received from [facility]   Overall Financial Resource Strain (CARDIA)    Difficulty of Paying Living Expenses: Not hard at all  Food Insecurity: No Food Insecurity (08/16/2024)   Received from [facility]   Epic    Within the past 12 months, you worried that your food would run out before you got the money to buy more.: Never true    Within the past 12 months, the food you bought just didn't last and you didn't have money to get more.: Never true  Transportation Needs: No Transportation Needs (08/16/2024)   Received from [facility] - Transportation    In the past 12 months, has lack of transportation kept you from medical appointments or from getting medications?: No    Lack of Transportation (Non-Medical): No  Physical Activity: Sufficiently Active (02/17/2024)   Exercise Vital Sign    Days of Exercise per Week: 3 days    Minutes of Exercise per Session: 60 min  Stress: No Stress Concern Present (02/17/2024)   [employer] of [employer]ational [employer] - Occupational Stress Questionnaire    Feeling of Stress: Only a little  Social Connections: Socially Integrated (02/17/2024)   Social Connection and Isolation Panel    Frequency of Communication with Friends and Family: More than three times a week    Frequency of Social Gatherings with Friends and Family: Once a week    Attends Religious Services: More than 4 times per year    Active Member of Clubs or Organizations: Yes    Attends Banker Meetings: More than 4 times per year    Marital Status: Married  Depression (PHQ2-9): Low Risk (02/20/2024)   Depression (PHQ2-9)    PHQ-2 Score: 3  Alcohol  Screen: Low Risk (02/17/2024)    Alcohol  Screen    Last Alcohol  Screening Score (AUDIT): 0  Housing: Low Risk  (08/16/2024)   Received from [facility]   Epic    In the last 12 months, was there a time when you were not able to pay the mortgage or rent on time?: No    In the past 12 months, how many times have you moved where you were living?: 0    At any time in the past 12 months, were you homeless or living in a shelter (including now)?: No  Utilities: Not At Risk (08/16/2024)   Received from [facility]   Epic    In the past 12 months has the electric, gas, oil, or water  company threatened to shut off services in your home?: No  Health Literacy: Adequate Health Literacy (02/20/2024)   B1300 Health Literacy    Frequency of need for help with medical instructions: Never     Review of Systems  Constitutional:  Negative for appetite change and unexpected weight change.  HENT:  Negative for congestion and sinus pressure.   Respiratory:  Negative for cough, chest tightness and shortness of breath.   Cardiovascular:  Negative for chest pain, palpitations and leg swelling.  Gastrointestinal:  Negative for abdominal pain, diarrhea, nausea and vomiting.  Genitourinary:  Negative for difficulty urinating and dysuria.  Musculoskeletal:  Negative for joint swelling and myalgias.  Skin:  Negative for color change and rash.  Neurological:  Negative for dizziness and headaches.  Psychiatric/Behavioral:  Negative for agitation and dysphoric mood.        Objective:     BP  132/78   Pulse 80   Temp 97.8 F (36.6 C) (Oral)   Ht 5' 2 (1.575 m)   Wt 166 lb (75.3 kg)   LMP 07/21/1988   SpO2 99%   BMI 30.36 kg/m  Wt Readings from Last 3 Encounters:  08/10/24 166 lb (75.3 kg)  04/13/24 169 lb 4 oz (76.8 kg)  03/30/24 168 lb (76.2 kg)    Physical Exam Vitals reviewed.  Constitutional:      General: She is not in acute distress.    Appearance: Normal appearance.  HENT:     Head:  Normocephalic and atraumatic.     Right Ear: External ear normal.     Left Ear: External ear normal.     Mouth/Throat:     Pharynx: No oropharyngeal exudate or posterior oropharyngeal erythema.  Eyes:     General: No scleral icterus.       Right eye: No discharge.        Left eye: No discharge.     Conjunctiva/sclera: Conjunctivae normal.  Neck:     Thyroid : No thyromegaly.  Cardiovascular:     Rate and Rhythm: Normal rate and regular rhythm.  Pulmonary:     Effort: No respiratory distress.     Breath sounds: Normal breath sounds. No wheezing.  Abdominal:     General: Bowel sounds are normal.     Palpations: Abdomen is soft.     Tenderness: There is no abdominal tenderness.  Musculoskeletal:        General: No swelling or tenderness.     Cervical back: Neck supple. No tenderness.  Lymphadenopathy:     Cervical: No cervical adenopathy.  Skin:    Findings: No erythema or rash.  Neurological:     Mental Status: She is alert.  Psychiatric:        Mood and Affect: Mood normal.        Behavior: Behavior normal.         Outpatient Encounter Medications as of 08/10/2024  Medication Sig   albuterol  (VENTOLIN  HFA) 108 (90 Base) MCG/ACT inhaler INHALE 2 PUFFS EVERY 6 HOURS AS NEEDED FOR WHEEZING OR SHORTNESS OF BREATH   amLODipine  (NORVASC ) 5 MG tablet TAKE 1 TABLET BY MOUTH DAILY   aspirin EC 325 MG tablet Take 325 mg by mouth daily.   benazepril  (LOTENSIN ) 40 MG tablet Take 1 tablet (40 mg total) by mouth daily.   Calcium  Carb-Cholecalciferol  (CALCIUM  600 + D PO) Take 1 tablet by mouth daily.   carbidopa -levodopa  (SINEMET  IR) 25-100 MG tablet Take 1 tablet by mouth 3 (three) times daily.   fish oil-omega-3 fatty acids 1000 MG capsule Take 2 g by mouth 2 (two) times daily.   fluticasone  (FLONASE ) 50 MCG/ACT nasal spray PLACE 2 SPRAYS INTO BOTH NOSTRILS AS NEEDED.   ibuprofen  (ADVIL ) 200 MG tablet Take 400 mg by mouth every 6 (six) hours as needed for moderate pain.   lidocaine   (LIDODERM ) 5 % Place 1 patch onto the skin daily as needed (pain). Remove & Discard patch within 12 hours or as directed by MD   loratadine  (CLARITIN ) 10 MG tablet Take 10 mg by mouth daily as needed for allergies.   Magnesium 400 MG CAPS Take by mouth daily.   meloxicam (MOBIC) 15 MG tablet Take 15 mg by mouth daily.   [EXPIRED] methylPREDNISolone (MEDROL DOSEPAK) 4 MG TBPK tablet See admin instructions. follow package directions   Multiple Vitamin (MULTIVITAMIN) tablet Take 1 tablet by mouth daily.   niacin (  VITAMIN B3) 500 MG tablet Take 1,000 mg by mouth 2 (two) times daily.   ondansetron  (ZOFRAN -ODT) 4 MG disintegrating tablet DISSOLVE ONE TABLET ON TONGUE EVERY 8 HOURS AS NEEDED FOR NAUSEA / VOMITING   pantoprazole  (PROTONIX ) 40 MG tablet Take 1 tablet (40 mg total) by mouth daily.   Povidone, PF, (IVIZIA DRY EYES) 0.5 % SOLN Place 1 drop into both eyes 2 (two) times daily.   Probiotic Product (VSL#3 PO) Take 1 capsule by mouth daily.   Simethicone (GAS-X PO) Take 2 tablets by mouth daily as needed (gas).   triamcinolone  cream (KENALOG ) 0.1 % Apply 1 Application topically as needed (psoriasis).   ezetimibe  (ZETIA ) 10 MG tablet Take 1 tablet (10 mg total) by mouth daily.   sertraline  (ZOLOFT ) 25 MG tablet Take 1 tablet (25 mg total) by mouth daily.   [DISCONTINUED] ezetimibe  (ZETIA ) 10 MG tablet Take 1 tablet (10 mg total) by mouth daily.   [DISCONTINUED] fluticasone  (FLONASE ) 50 MCG/ACT nasal spray Place 2 sprays into both nostrils as needed.   [DISCONTINUED] linaclotide  (LINZESS ) 72 MCG capsule Take 1 capsule (72 mcg total) by mouth daily before breakfast. (Patient not taking: Reported on 08/10/2024)   [DISCONTINUED] sertraline  (ZOLOFT ) 25 MG tablet Take 1 tablet (25 mg total) by mouth daily.   No facility-administered encounter medications on file as of 08/10/2024.     Lab Results  Component Value Date   WBC 4.9 03/28/2024   HGB 12.4 03/28/2024   HCT 37.3 03/28/2024   PLT 234.0  03/28/2024   GLUCOSE 109 (H) 08/08/2024   CHOL 203 (H) 08/08/2024   TRIG 59.0 08/08/2024   HDL 95.40 08/08/2024   LDLDIRECT 101.4 05/07/2013   LDLCALC 96 08/08/2024   ALT 25 08/10/2024   AST 44 (H) 08/10/2024   NA 141 08/08/2024   K 3.7 08/08/2024   CL 105 08/08/2024   CREATININE 0.74 08/08/2024   BUN 13 08/08/2024   CO2 29 08/08/2024   TSH 1.95 08/08/2024   HGBA1C 5.8 08/08/2024       Assessment & Plan:  Abnormal liver function test Assessment & Plan: Check liver panel and GGT.   Orders: -     Gamma GT -     Hepatic function panel  Primary hypertension Assessment & Plan: Continue amlodipine , benazepril .  Blood pressure has been under good control on this regimen.  Follow pressures.  Follow metabolic panel.   Orders: -     Basic metabolic panel with GFR; Future  Hyperglycemia Assessment & Plan: Low carb diet and exercise. Follow met b and A1c.   Orders: -     Hemoglobin A1c; Future  Hypercholesterolemia Assessment & Plan: The 10-year ASCVD risk score (Arnett DK, et al., 2019) is: 42.8%   Values used to calculate the score:     Age: 14 years     Clinically relevant sex: Female     Is Non-Hispanic African American: No     Diabetic: No     Tobacco smoker: No     Systolic Blood Pressure: 149 mmHg     Is BP treated: Yes     HDL Cholesterol: 95.4 mg/dL     Total Cholesterol: 203 mg/dL  Intolerant to statin.  On zetia . Continue diet and exercise. Follow lipid panel.   Orders: -     Lipid panel; Future -     Hepatic function panel; Future  Leukocytosis, unspecified type Assessment & Plan: Last cbc wnl. (03/2024).    Parkinson's disease, unspecified whether dyskinesia  present, unspecified whether manifestations fluctuate [facility]) Assessment & Plan: Dr Tat 09/2022 - diagnosed.  Continues on sinemet . Boxing.  Continue f/u with Dr Tat. . Had f/u with Dr Tat - 03/27/24 - continue sinemet .   Osteoporosis without current pathological fracture, unspecified  osteoporosis type Assessment & Plan: Saw endocrinology 05/25/24 - recommended reclast for 3-4 years - then holiday. DEXA due 12/2025. Received reclast 06/13/24.   Neoplasm of left breast, primary tumor 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) Assessment & Plan:  Was seeing Dr Dessa - f/u breast cancer. He retired. Recommended continued annual screening through PCP.    Lung nodule Assessment & Plan:  Incidental finding CT abdomen 04/26/21.  Had f/u chest CT 10/02/21 - stable 4mm right lower lobe pulmonary nodule.  F/u pulmonary 10/2023 - recommended f/u CT in one year.    Gastroesophageal reflux disease without esophagitis Assessment & Plan: Recently saw GI. Recommended protonix . Follow.    Other orders -     Sertraline  HCl; Take 1 tablet (25 mg total) by mouth daily.  Dispense: 90 tablet; Refill: 1 -     Ezetimibe ; Take 1 tablet (10 mg total) by mouth daily.  Dispense: 90 tablet; Refill: 1     Allena Hamilton, MD "

## 2024-08-11 ENCOUNTER — Ambulatory Visit: Payer: Self-pay | Admitting: Internal Medicine

## 2024-08-14 ENCOUNTER — Other Ambulatory Visit: Payer: Self-pay | Admitting: Internal Medicine

## 2024-08-14 DIAGNOSIS — R7989 Other specified abnormal findings of blood chemistry: Secondary | ICD-10-CM

## 2024-08-14 NOTE — Progress Notes (Signed)
 Order placed for abdominal ultrasound and referral to GI per phone note.

## 2024-08-16 ENCOUNTER — Ambulatory Visit: Payer: Self-pay

## 2024-08-16 ENCOUNTER — Ambulatory Visit
Admission: RE | Admit: 2024-08-16 | Discharge: 2024-08-16 | Disposition: A | Discharge status: Home / Self Care | Source: Ambulatory Visit | Attending: Internal Medicine | Admitting: Internal Medicine

## 2024-08-16 ENCOUNTER — Ambulatory Visit

## 2024-08-16 DIAGNOSIS — R7989 Other specified abnormal findings of blood chemistry: Secondary | ICD-10-CM | POA: Insufficient documentation

## 2024-08-16 NOTE — Telephone Encounter (Signed)
 Darice called to see if Helen Marshall could come in at 4:00 today - work in appt for this.  Per pt, already at [facility] for evaluation.

## 2024-08-16 NOTE — Telephone Encounter (Signed)
 FYI Only or Action Required?: FYI only for provider: advised UC d/t lack of appt.  Patient was last seen in primary care on 08/10/2024 by Glendia Shad, MD.  Called Nurse Triage reporting Urinary Frequency.  Symptoms began several days ago.  Interventions attempted: Rest, hydration, or home remedies.  Symptoms are: unchanged.  Triage Disposition: See Physician Within 24 Hours  Patient/caregiver understands and will follow disposition?: yes   Copied from CRM #[id number]. Topic: Clinical - Red Word Triage >> Aug 16, 2024  9:41 AM Drema MATSU wrote: Red Word that prompted transfer to Nurse Triage: Pt thinks she has a UTI. Symptoms: burning, frequent urination, pain, feels like bladder is always full Reason for Disposition  Urinating more frequently than usual (i.e., frequency) OR new-onset of the feeling of an urgent need to urinate (i.e., urgency)  Answer Assessment - Initial Assessment Questions 1. SYMPTOM: What's the main symptom you're concerned about? (e.g., frequency, incontinence)     Frequency and burning 2. ONSET: When did it  start?     About a week 3. PAIN: Is there any pain? If Yes, ask: How bad is it? (Scale: 1-10; mild, moderate, severe)     Moderate, 4. CAUSE: What do you think is causing the symptoms?     UTI 5. OTHER SYMPTOMS: Do you have any other symptoms? (e.g., blood in urine, fever, flank pain, pain with urination)     Feels like bladder is always full  No appts available in the next 24 hours, pt advised to utilize UC.  Protocols used: Urinary Symptoms-A-AH

## 2024-08-16 NOTE — Telephone Encounter (Signed)
 Please call her and let her know that I can work her in today at 4:00 - work in for this. Check urine when arrives.

## 2024-08-17 ENCOUNTER — Ambulatory Visit: Payer: Self-pay | Admitting: Internal Medicine

## 2024-08-17 DIAGNOSIS — N281 Cyst of kidney, acquired: Secondary | ICD-10-CM

## 2024-08-17 NOTE — Telephone Encounter (Signed)
 Order placed for urology referra.

## 2024-08-18 ENCOUNTER — Encounter: Payer: Self-pay | Admitting: Internal Medicine

## 2024-08-18 NOTE — Assessment & Plan Note (Signed)
 Incidental finding CT abdomen 04/26/21.  Had f/u chest CT 10/02/21 - stable 4mm right lower lobe pulmonary nodule.  F/u pulmonary 10/2023 - recommended f/u CT in one year.

## 2024-08-18 NOTE — Assessment & Plan Note (Signed)
 Was seeing Dr Lemar Livings - f/u breast cancer. He retired. Recommended continued annual screening through PCP.

## 2024-08-18 NOTE — Assessment & Plan Note (Signed)
 The 10-year ASCVD risk score (Arnett DK, et al., 2019) is: 42.8%   Values used to calculate the score:     Age: 79 years     Clinically relevant sex: Female     Is Non-Hispanic African American: No     Diabetic: No     Tobacco smoker: No     Systolic Blood Pressure: 149 mmHg     Is BP treated: Yes     HDL Cholesterol: 95.4 mg/dL     Total Cholesterol: 203 mg/dL  Intolerant to statin.  On zetia . Continue diet and exercise. Follow lipid panel.

## 2024-08-18 NOTE — Assessment & Plan Note (Signed)
 Check liver panel and GGT.

## 2024-08-18 NOTE — Assessment & Plan Note (Signed)
 Last cbc wnl. (03/2024).

## 2024-08-18 NOTE — Assessment & Plan Note (Signed)
 Dr Tat 09/2022 - diagnosed.  Continues on sinemet . Boxing.  Continue f/u with Dr Tat. . Had f/u with Dr Tat - 03/27/24 - continue sinemet .

## 2024-08-18 NOTE — Assessment & Plan Note (Signed)
 Low-carb diet and exercise.  Follow met b and A1c.

## 2024-08-18 NOTE — Assessment & Plan Note (Signed)
 Recently saw GI. Recommended protonix . Follow.

## 2024-08-18 NOTE — Assessment & Plan Note (Signed)
 Saw endocrinology 05/25/24 - recommended reclast for 3-4 years - then holiday. DEXA due 12/2025. Received reclast 06/13/24.

## 2024-08-18 NOTE — Assessment & Plan Note (Signed)
Continue amlodipine, benazepril.  Blood pressure has been under good control on this regimen.  Follow pressures.  Follow metabolic panel.   

## 2024-08-24 ENCOUNTER — Encounter: Payer: Self-pay | Admitting: Urology

## 2024-08-24 ENCOUNTER — Ambulatory Visit (INDEPENDENT_AMBULATORY_CARE_PROVIDER_SITE_OTHER): Admitting: Urology

## 2024-08-24 ENCOUNTER — Encounter: Payer: Self-pay | Admitting: Internal Medicine

## 2024-08-24 VITALS — BP 123/75 | HR 83 | Ht 63.0 in | Wt 167.0 lb

## 2024-08-24 DIAGNOSIS — N2889 Other specified disorders of kidney and ureter: Secondary | ICD-10-CM

## 2024-08-24 NOTE — Progress Notes (Signed)
 "  08/24/2024 9:03 AM   Helen Marshall 10/10/1945 [id number]  Referring provider: Glendia Shad, MD [address] [place],  [place] [address]  Chief Complaint  Patient presents with   Other    HPI: Helen Marshall is a 79 y.o. female referred for evaluation of a renal mass.  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  There was a 1.2 cm simple cyst also noted in the left kidney Denies flank, abdominal or pelvic pain States she has occasional UTIs No gross hematuria   PMH: Past Medical History:  Diagnosis Date   Abnormal LFTs    Abnormal mammogram 12/19/2012   left   Allergy    Anemia    Arthritis    Cancer [facility]) January 22, 2013   left Breast   Carotid bruit    left   Cataract Had removed 2 years ago   Colon polyps    Diverticulosis 2012   Environmental allergies    Family history of adverse reaction to anesthesia    her aunt was hard to wake up   GERD (gastroesophageal reflux disease)    Heart murmur    History of diverticulitis 05/2021   Hypercholesterolemia    Hyperglycemia    Hypertension    IBS (irritable bowel syndrome)    Lipoma of colon    Lumbar disc disease    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   Pre-diabetes    Tubular adenoma of colon    Umbilical hernia 05/2021    Surgical History: Past Surgical History:  Procedure Laterality Date   ABDOMINAL HYSTERECTOMY  1989   fibroids   ANKLE SURGERY Left 1999   ANTERIOR AND POSTERIOR REPAIR WITH SACROSPINOUS FIXATION N/A 07/20/2021   Procedure: ANTERIOR AND REPAIR WITH SACROSPINOUS FIXATION;  Surgeon: Marilynne Rosaline SAILOR, MD;  Location: [facility];  Service: Gynecology;  Laterality: N/A;   BREAST LUMPECTOMY Left 2014   breast ca radation no chemp per pt   BREAST SURGERY Left 1970 & 2914   biopsy   CATARACT EXTRACTION, BILATERAL  June & July 2017   CHOLECYSTECTOMY  2001   COLONOSCOPY  06/02/2011   Deward Piedmont, MD; diverticulosis, submucosal lipoma of the sigmoid colon.   CYSTOSCOPY N/A 07/20/2021   Procedure: CYSTOSCOPY;  Surgeon: Marilynne Rosaline SAILOR, MD;  Location: [facility];  Service: Gynecology;  Laterality: N/A;   EYE SURGERY  Cataracts   eyelid lift Bilateral    FRACTURE SURGERY  Broke both bones in left ankle 2000   HERNIA REPAIR  2001, 2004   HERNIA REPAIR  01/22/2013   Repeat repair of umbilical defect, 2.5 cm. Primary repair.   INSERTION OF MESH N/A 03/31/2022   Procedure: INSERTION OF MESH;  Surgeon: Dessa Reyes LELON, MD;  Location: [facility];  Service: General;  Laterality: N/A;   LAPAROSCOPIC HYSTERECTOMY     LUMBAR LAMINECTOMY/DECOMPRESSION MICRODISCECTOMY Bilateral 06/18/2022   Procedure: Bilateral L3-4 Lumbar decompression;  Surgeon: Gillie Duncans, MD;  Location: [facility];  Service: Neurosurgery;  Laterality: Bilateral;   NASAL SINUS SURGERY  1997   rotator cuff surgery  1998   SQUAMOUS CELL CARCINOMA EXCISION Right 05/2013  shoulder   TUBAL LIGATION  1976   UPPER GI ENDOSCOPY  08/22/2014   Dr JINNY. Albert[facility]   VENTRAL HERNIA REPAIR N/A 03/31/2022   Procedure: HERNIA REPAIR VENTRAL ADULT;  Surgeon: Dessa Reyes ORN, MD;  Location: [facility];  Service: General;  Laterality: N/A;  Shelba Rakers, RNFA to assist TAPP block per anesthesia    Home Medications:  Allergies as of 08/24/2024       Reactions   Bactrim  [sulfamethoxazole -trimethoprim ] Nausea Only   Crestor [rosuvastatin] Other (See Comments)   Leg cramping   Demerol [meperidine] Nausea And Vomiting   Latex Other (See Comments)   Redness, hands breakout   Lipitor [atorvastatin] Other  (See Comments)   pain   Lovastatin Other (See Comments)   Elevated CK   Oxycodone  Nausea And Vomiting   Pravastatin Other (See Comments)   pain   Tramadol  Nausea Only   Vicodin [hydrocodone-acetaminophen ] Other (See Comments)   Felt weird    Zocor [simvastatin] Other (See Comments)   pain   Gabapentin  Rash        Medication List        Accurate as of August 24, 2024  9:03 AM. If you have any questions, ask your nurse or doctor.          STOP taking these medications    ondansetron  4 MG disintegrating tablet Commonly known as: ZOFRAN -ODT   VSL#3 PO       TAKE these medications    carbidopa -levodopa  25-100 MG tablet Commonly known as: SINEMET  IR Take 1 tablet by mouth 3 (three) times daily. The timing of this medication is very important.   albuterol  108 (90 Base) MCG/ACT inhaler Commonly known as: VENTOLIN  HFA INHALE 2 PUFFS EVERY 6 HOURS AS NEEDED FOR WHEEZING OR SHORTNESS OF BREATH   amLODipine  5 MG tablet Commonly known as: NORVASC  TAKE 1 TABLET BY MOUTH DAILY   aspirin EC 325 MG tablet Take 325 mg by mouth daily.   benazepril  40 MG tablet Commonly known as: LOTENSIN  Take 1 tablet (40 mg total) by mouth daily.   CALCIUM  600 + D PO Take 1 tablet by mouth daily.   ezetimibe  10 MG tablet Commonly known as: ZETIA  Take 1 tablet (10 mg total) by mouth daily.   fish oil-omega-3 fatty acids 1000 MG capsule Take 2 g by mouth 2 (two) times daily.   fluticasone  50 MCG/ACT nasal spray Commonly known as: FLONASE  PLACE 2 SPRAYS INTO BOTH NOSTRILS AS NEEDED.   GAS-X PO Take 2 tablets by mouth daily as needed (gas).   ibuprofen  200 MG tablet Commonly known as: ADVIL  Take 400 mg by mouth every 6 (six) hours as needed for moderate pain.   iVIZIA Dry Eyes 0.5 % Soln Generic drug: Povidone (PF) Place 1 drop into both eyes 2 (two) times daily.   lidocaine  5 % Commonly known as: LIDODERM  Place 1 patch onto the skin daily as needed (pain). Remove &  Discard patch within 12 hours or as directed by MD   loratadine  10 MG tablet Commonly known as: CLARITIN  Take 10 mg by mouth daily as needed for allergies.   Magnesium 400 MG Caps Take by mouth daily.   meloxicam 15 MG tablet Commonly known as: MOBIC Take 15 mg by mouth daily.   multivitamin tablet Take 1 tablet by mouth daily.   niacin 500 MG tablet Commonly known as: VITAMIN B3 Take 1,000 mg by mouth 2 (two) times daily.   pantoprazole  40 MG tablet Commonly known as:  PROTONIX  Take 1 tablet (40 mg total) by mouth daily.   sertraline  25 MG tablet Commonly known as: Zoloft  Take 1 tablet (25 mg total) by mouth daily.   triamcinolone  cream 0.1 % Commonly known as: KENALOG  Apply 1 Application topically as needed (psoriasis).        Allergies: Allergies[1]  Family History: Family History  Problem Relation Age of Onset   Heart disease Mother        myocardial infarction   Diabetes Mother    Hearing loss Mother    CVA Father    Alcohol  abuse Father    Stroke Father    Arthritis/Rheumatoid Sister        x2   Breast cancer Sister 7   Arthritis Sister    Hearing loss Sister    Hypertension Sister    Varicose Veins Sister    Arthritis Sister    Hearing loss Sister    Fibromyalgia Sister    Hypertension Sister    Asthma Sister    Arthritis Sister    Hearing loss Sister    Diabetes Sister    Heart disease Sister    Hypertension Sister    Obesity Sister    Hypothyroidism Sister    Arthritis Sister    Hearing loss Sister    Cancer Sister    Heart disease Sister    Hypertension Sister    Kidney disease Sister    Breast cancer Sister 62   Colon polyps Sister    Arthritis Sister    Hearing loss Sister    Asthma Sister    Stroke Sister    Varicose Veins Sister    Hypertension Brother        x2   Arthritis Brother    Hearing loss Brother    Kidney disease Brother    Obesity Brother    Alcohol  abuse Brother    Arthritis Brother    Hearing loss  Brother    Stroke Daughter    Colon polyps Son    Alcohol  abuse Son    Colon cancer Maternal Uncle    Cancer Other        breast - paternal first cousin    Social History:  reports that she has never smoked. She has never used smokeless tobacco. She reports that she does not drink alcohol  and does not use drugs.   Physical Exam: BP 123/75   Pulse 83   Ht 5' 3 (1.6 m)   Wt 167 lb (75.8 kg)   LMP 07/21/1988   SpO2 96%   BMI 29.58 kg/m   Constitutional:  Alert, No acute distress. HEENT: [place] AT Respiratory: Normal respiratory effort, no increased work of breathing. Psychiatric: Normal mood and affect.    Pertinent Imaging: RUS images were personally reviewed and interpreted  Assessment & Plan:    1.  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 Renal mass protocol CT ordered to verify We discussed AMLs greater than 4 cm are at risk for spontaneous bleeding and will need periodic imaging if AML verified on CT   Glendia JAYSON Barba, MD  [facility] [address] [place], [place] [address] [phone]     [1]  Allergies Allergen Reactions   Bactrim  [Sulfamethoxazole -Trimethoprim ] Nausea Only   Crestor [Rosuvastatin] Other (See Comments)    Leg cramping    Demerol [Meperidine] Nausea And Vomiting   Latex Other (See Comments)    Redness, hands breakout   Lipitor [Atorvastatin] Other (See  Comments)    pain    Lovastatin Other (See Comments)    Elevated CK   Oxycodone  Nausea And Vomiting   Pravastatin Other (See Comments)    pain   Tramadol  Nausea Only   Vicodin [Hydrocodone-Acetaminophen ] Other (See Comments)    Felt weird    Zocor [Simvastatin] Other (See Comments)    pain    Gabapentin  Rash   "

## 2024-08-24 NOTE — Patient Instructions (Signed)
 Scheduling number: [phone]

## 2024-08-24 NOTE — Progress Notes (Signed)
 Error

## 2024-09-04 ENCOUNTER — Ambulatory Visit
Admission: RE | Admit: 2024-09-04 | Discharge: 2024-09-04 | Disposition: A | Source: Ambulatory Visit | Attending: Pulmonary Disease

## 2024-09-04 ENCOUNTER — Ambulatory Visit
Admission: RE | Admit: 2024-09-04 | Discharge: 2024-09-04 | Disposition: A | Source: Ambulatory Visit | Attending: Urology | Admitting: Urology

## 2024-09-04 DIAGNOSIS — R911 Solitary pulmonary nodule: Secondary | ICD-10-CM

## 2024-09-04 DIAGNOSIS — N2889 Other specified disorders of kidney and ureter: Secondary | ICD-10-CM

## 2024-09-04 MED ORDER — IOHEXOL 300 MG/ML  SOLN
100.0000 mL | Freq: Once | INTRAMUSCULAR | Status: AC | PRN
  Administered 2024-09-04: 100 mL via INTRAVENOUS

## 2024-09-07 ENCOUNTER — Ambulatory Visit: Payer: Self-pay | Admitting: Urology

## 2024-09-07 ENCOUNTER — Other Ambulatory Visit: Payer: Self-pay | Admitting: *Deleted

## 2024-09-07 DIAGNOSIS — N2889 Other specified disorders of kidney and ureter: Secondary | ICD-10-CM

## 2024-09-18 ENCOUNTER — Ambulatory Visit: Admitting: Physician Assistant

## 2024-10-04 ENCOUNTER — Ambulatory Visit: Admitting: Neurology

## 2024-10-04 ENCOUNTER — Telehealth: Admitting: Neurology

## 2024-12-12 ENCOUNTER — Other Ambulatory Visit

## 2024-12-14 ENCOUNTER — Encounter: Admitting: Internal Medicine

## 2025-02-20 ENCOUNTER — Ambulatory Visit

## 2025-09-06 ENCOUNTER — Ambulatory Visit: Admitting: Urology
